# Patient Record
Sex: Female | Born: 1970
Health system: Southern US, Community
[De-identification: ages and names within clinical notes are randomized; demographics above are authoritative.]

## PROBLEM LIST (undated history)

## (undated) DIAGNOSIS — K219 Gastro-esophageal reflux disease without esophagitis: Secondary | ICD-10-CM

## (undated) DIAGNOSIS — F329 Major depressive disorder, single episode, unspecified: Secondary | ICD-10-CM

## (undated) DIAGNOSIS — F419 Anxiety disorder, unspecified: Secondary | ICD-10-CM

## (undated) DIAGNOSIS — I7300 Raynaud's syndrome without gangrene: Secondary | ICD-10-CM

## (undated) DIAGNOSIS — F32A Depression, unspecified: Secondary | ICD-10-CM

## (undated) DIAGNOSIS — R51 Headache: Secondary | ICD-10-CM

## (undated) DIAGNOSIS — M25519 Pain in unspecified shoulder: Secondary | ICD-10-CM

## (undated) DIAGNOSIS — D649 Anemia, unspecified: Secondary | ICD-10-CM

## (undated) DIAGNOSIS — K5900 Constipation, unspecified: Secondary | ICD-10-CM

## (undated) DIAGNOSIS — G4700 Insomnia, unspecified: Secondary | ICD-10-CM

## (undated) DIAGNOSIS — Z780 Asymptomatic menopausal state: Secondary | ICD-10-CM

## (undated) DIAGNOSIS — E669 Obesity, unspecified: Secondary | ICD-10-CM

## (undated) DIAGNOSIS — M549 Dorsalgia, unspecified: Secondary | ICD-10-CM

## (undated) DIAGNOSIS — G4733 Obstructive sleep apnea (adult) (pediatric): Principal | ICD-10-CM

## (undated) DIAGNOSIS — E119 Type 2 diabetes mellitus without complications: Secondary | ICD-10-CM

## (undated) DIAGNOSIS — I10 Essential (primary) hypertension: Secondary | ICD-10-CM

## (undated) DIAGNOSIS — G4730 Sleep apnea, unspecified: Secondary | ICD-10-CM

## (undated) DIAGNOSIS — C801 Malignant (primary) neoplasm, unspecified: Secondary | ICD-10-CM

## (undated) DIAGNOSIS — E785 Hyperlipidemia, unspecified: Secondary | ICD-10-CM

## (undated) DIAGNOSIS — F411 Generalized anxiety disorder: Secondary | ICD-10-CM

## (undated) DIAGNOSIS — Z9289 Personal history of other medical treatment: Secondary | ICD-10-CM

## (undated) DIAGNOSIS — F4320 Adjustment disorder, unspecified: Secondary | ICD-10-CM

## (undated) DIAGNOSIS — R002 Palpitations: Secondary | ICD-10-CM

## (undated) DIAGNOSIS — E559 Vitamin D deficiency, unspecified: Secondary | ICD-10-CM

## (undated) DIAGNOSIS — E538 Deficiency of other specified B group vitamins: Secondary | ICD-10-CM

## (undated) HISTORY — DX: Dorsalgia, unspecified: M54.9

## (undated) HISTORY — DX: Malignant (primary) neoplasm, unspecified: C80.1

## (undated) HISTORY — PX: TYMPANOSTOMY TUBE PLACEMENT: SHX32

## (undated) HISTORY — DX: Obesity, unspecified: E66.9

## (undated) HISTORY — DX: Asymptomatic menopausal state: Z78.0

## (undated) HISTORY — DX: Personal history of other medical treatment: Z92.89

## (undated) HISTORY — DX: Raynaud's syndrome without gangrene: I73.00

## (undated) HISTORY — DX: Major depressive disorder, single episode, unspecified: F32.9

## (undated) HISTORY — DX: Palpitations: R00.2

## (undated) HISTORY — DX: Obstructive sleep apnea (adult) (pediatric): G47.33

## (undated) HISTORY — DX: Type 2 diabetes mellitus without complications: E11.9

## (undated) HISTORY — DX: Pain in unspecified shoulder: M25.519

## (undated) HISTORY — DX: Adjustment disorder, unspecified: F43.20

## (undated) HISTORY — DX: Deficiency of other specified B group vitamins: E53.8

## (undated) HISTORY — PX: TUBAL LIGATION: SHX77

## (undated) HISTORY — DX: Anxiety disorder, unspecified: F41.9

## (undated) HISTORY — DX: Headache: R51

## (undated) HISTORY — DX: Gastro-esophageal reflux disease without esophagitis: K21.9

## (undated) HISTORY — DX: Anemia, unspecified: D64.9

## (undated) HISTORY — DX: Depression, unspecified: F32.A

## (undated) HISTORY — DX: Sleep apnea, unspecified: G47.30

## (undated) HISTORY — DX: Insomnia, unspecified: G47.00

## (undated) HISTORY — PX: BREAST BIOPSY: SHX20

## (undated) HISTORY — PX: ECTOPIC PREGNANCY SURGERY: SHX613

## (undated) HISTORY — DX: Essential (primary) hypertension: I10

## (undated) HISTORY — DX: Generalized anxiety disorder: F41.1

## (undated) HISTORY — PX: RETINAL DETACHMENT SURGERY: SHX105

## (undated) HISTORY — DX: Constipation, unspecified: K59.00

## (undated) HISTORY — DX: Hyperlipidemia, unspecified: E78.5

## (undated) HISTORY — DX: Vitamin D deficiency, unspecified: E55.9

---

## 1997-12-10 ENCOUNTER — Other Ambulatory Visit: Admission: RE | Admit: 1997-12-10 | Discharge: 1997-12-10 | Payer: Self-pay | Admitting: Obstetrics & Gynecology

## 1998-01-25 ENCOUNTER — Other Ambulatory Visit: Admission: RE | Admit: 1998-01-25 | Discharge: 1998-01-25 | Payer: Self-pay | Admitting: Obstetrics & Gynecology

## 1998-03-30 ENCOUNTER — Other Ambulatory Visit: Admission: RE | Admit: 1998-03-30 | Discharge: 1998-03-30 | Payer: Self-pay | Admitting: Obstetrics & Gynecology

## 1998-11-24 ENCOUNTER — Ambulatory Visit (HOSPITAL_COMMUNITY): Admission: RE | Admit: 1998-11-24 | Discharge: 1998-11-24 | Payer: Self-pay | Admitting: Obstetrics & Gynecology

## 1998-11-24 ENCOUNTER — Inpatient Hospital Stay (HOSPITAL_COMMUNITY): Admission: AD | Admit: 1998-11-24 | Discharge: 1998-11-24 | Payer: Self-pay | Admitting: Obstetrics & Gynecology

## 1998-11-24 ENCOUNTER — Encounter: Payer: Self-pay | Admitting: Obstetrics & Gynecology

## 1998-12-01 ENCOUNTER — Inpatient Hospital Stay (HOSPITAL_COMMUNITY): Admission: AD | Admit: 1998-12-01 | Discharge: 1998-12-01 | Payer: Self-pay | Admitting: Obstetrics & Gynecology

## 1998-12-05 ENCOUNTER — Encounter: Payer: Self-pay | Admitting: Obstetrics & Gynecology

## 1998-12-06 ENCOUNTER — Inpatient Hospital Stay (HOSPITAL_COMMUNITY): Admission: AD | Admit: 1998-12-06 | Discharge: 1998-12-08 | Payer: Self-pay | Admitting: Obstetrics & Gynecology

## 2006-05-28 DIAGNOSIS — C801 Malignant (primary) neoplasm, unspecified: Secondary | ICD-10-CM

## 2006-05-28 HISTORY — DX: Malignant (primary) neoplasm, unspecified: C80.1

## 2008-05-28 HISTORY — PX: ABDOMINAL HYSTERECTOMY: SHX81

## 2012-08-05 LAB — HM COLONOSCOPY

## 2013-08-26 LAB — HM MAMMOGRAPHY: HM Mammogram: NORMAL

## 2013-08-26 LAB — HM PAP SMEAR: HM Pap smear: NORMAL

## 2014-11-09 ENCOUNTER — Encounter: Payer: Self-pay | Admitting: *Deleted

## 2014-11-09 ENCOUNTER — Telehealth: Payer: Self-pay | Admitting: *Deleted

## 2014-11-09 NOTE — Telephone Encounter (Signed)
Pre-Visit Call completed with patient and chart updated.   Pre-Visit Info documented in Specialty Comments under [employer].    

## 2014-11-11 ENCOUNTER — Ambulatory Visit (INDEPENDENT_AMBULATORY_CARE_PROVIDER_SITE_OTHER): Payer: Federal, State, Local not specified - PPO | Admitting: Family Medicine

## 2014-11-11 ENCOUNTER — Encounter: Payer: Self-pay | Admitting: Family Medicine

## 2014-11-11 VITALS — BP 130/88 | HR 81 | Temp 98.2°F | Ht 63.0 in | Wt 176.5 lb

## 2014-11-11 DIAGNOSIS — E785 Hyperlipidemia, unspecified: Secondary | ICD-10-CM | POA: Insufficient documentation

## 2014-11-11 DIAGNOSIS — K5900 Constipation, unspecified: Secondary | ICD-10-CM

## 2014-11-11 DIAGNOSIS — E782 Mixed hyperlipidemia: Secondary | ICD-10-CM

## 2014-11-11 DIAGNOSIS — F32A Depression, unspecified: Secondary | ICD-10-CM | POA: Insufficient documentation

## 2014-11-11 DIAGNOSIS — F419 Anxiety disorder, unspecified: Secondary | ICD-10-CM

## 2014-11-11 DIAGNOSIS — C801 Malignant (primary) neoplasm, unspecified: Secondary | ICD-10-CM

## 2014-11-11 DIAGNOSIS — I10 Essential (primary) hypertension: Secondary | ICD-10-CM

## 2014-11-11 DIAGNOSIS — R030 Elevated blood-pressure reading, without diagnosis of hypertension: Secondary | ICD-10-CM

## 2014-11-11 DIAGNOSIS — F329 Major depressive disorder, single episode, unspecified: Secondary | ICD-10-CM | POA: Insufficient documentation

## 2014-11-11 DIAGNOSIS — E669 Obesity, unspecified: Secondary | ICD-10-CM

## 2014-11-11 DIAGNOSIS — IMO0001 Reserved for inherently not codable concepts without codable children: Secondary | ICD-10-CM

## 2014-11-11 DIAGNOSIS — K219 Gastro-esophageal reflux disease without esophagitis: Secondary | ICD-10-CM | POA: Diagnosis not present

## 2014-11-11 LAB — CBC
HCT: 40.7 % (ref 36.0–46.0)
Hemoglobin: 13.6 g/dL (ref 12.0–15.0)
MCHC: 33.5 g/dL (ref 30.0–36.0)
MCV: 89.7 fl (ref 78.0–100.0)
Platelets: 247 10*3/uL (ref 150.0–400.0)
RBC: 4.53 Mil/uL (ref 3.87–5.11)
RDW: 14 % (ref 11.5–15.5)
WBC: 5.2 10*3/uL (ref 4.0–10.5)

## 2014-11-11 LAB — COMPREHENSIVE METABOLIC PANEL
ALT: 10 U/L (ref 0–35)
AST: 13 U/L (ref 0–37)
Albumin: 4.1 g/dL (ref 3.5–5.2)
Alkaline Phosphatase: 36 U/L — ABNORMAL LOW (ref 39–117)
BUN: 17 mg/dL (ref 6–23)
CO2: 27 mEq/L (ref 19–32)
Calcium: 9.2 mg/dL (ref 8.4–10.5)
Chloride: 104 mEq/L (ref 96–112)
Creatinine, Ser: 0.73 mg/dL (ref 0.40–1.20)
GFR: 92.22 mL/min (ref >60.00)
Glucose, Bld: 75 mg/dL (ref 70–99)
Potassium: 3.9 mEq/L (ref 3.5–5.1)
Sodium: 137 mEq/L (ref 135–145)
Total Bilirubin: 0.4 mg/dL (ref 0.2–1.2)
Total Protein: 6.9 g/dL (ref 6.0–8.3)

## 2014-11-11 LAB — LIPID PANEL
Cholesterol: 213 mg/dL — ABNORMAL HIGH (ref 0–200)
HDL: 89 mg/dL (ref >39.00)
LDL Cholesterol: 111 mg/dL — ABNORMAL HIGH (ref 0–99)
NonHDL: 124
Total CHOL/HDL Ratio: 2
Triglycerides: 67 mg/dL (ref 0.0–149.0)
VLDL: 13.4 mg/dL (ref 0.0–40.0)

## 2014-11-11 LAB — TSH: TSH: 2.67 u[IU]/mL (ref 0.35–4.50)

## 2014-11-11 NOTE — Assessment & Plan Note (Signed)
Referred to dermatology for surveillance.

## 2014-11-11 NOTE — Patient Instructions (Addendum)
Probiotic daily  And/or yogurt, consider [employer] or [facility] or online at [email or web address], [place] makes a 10 strain probiotic  Preventive Care for Adults A healthy lifestyle and preventive care can promote health and wellness. Preventive health guidelines for women include the following key practices.  A routine yearly physical is a good way to check with your health care provider about your health and preventive screening. It is a chance to share any concerns and updates on your health and to receive a thorough exam.  Visit your dentist for a routine exam and preventive care every 6 months. Brush your teeth twice a day and floss once a day. Good oral hygiene prevents tooth decay and gum disease.  The frequency of eye exams is based on your age, health, family medical history, use of contact lenses, and other factors. Follow your health care provider's recommendations for frequency of eye exams.  Eat a healthy diet. Foods like vegetables, fruits, whole grains, low-fat dairy products, and lean protein foods contain the nutrients you need without too many calories. Decrease your intake of foods high in solid fats, added sugars, and salt. Eat the right amount of calories for you.Get information about a proper diet from your health care provider, if necessary.  Regular physical exercise is one of the most important things you can do for your health. Most adults should get at least 150 minutes of moderate-intensity exercise (any activity that increases your heart rate and causes you to sweat) each week. In addition, most adults need muscle-strengthening exercises on 2 or more days a week.  Maintain a healthy weight. The body mass index (BMI) is a screening tool to identify possible weight problems. It provides an estimate of body fat based on height and weight. Your health care provider can find your BMI and can help you achieve or maintain a healthy weight.For adults 20  years and older:  A BMI below 18.5 is considered underweight.  A BMI of 18.5 to 24.9 is normal.  A BMI of 25 to 29.9 is considered overweight.  A BMI of 30 and above is considered obese.  Maintain normal blood lipids and cholesterol levels by exercising and minimizing your intake of saturated fat. Eat a balanced diet with plenty of fruit and vegetables. Blood tests for lipids and cholesterol should begin at age 66 and be repeated every 5 years. If your lipid or cholesterol levels are high, you are over 50, or you are at high risk for heart disease, you may need your cholesterol levels checked more frequently.Ongoing high lipid and cholesterol levels should be treated with medicines if diet and exercise are not working.  If you smoke, find out from your health care provider how to quit. If you do not use tobacco, do not start.  Lung cancer screening is recommended for adults aged 29-80 years who are at high risk for developing lung cancer because of a history of smoking. A yearly low-dose CT scan of the lungs is recommended for people who have at least a 30-pack-year history of smoking and are a current smoker or have quit within the past 15 years. A pack year of smoking is smoking an average of 1 pack of cigarettes a day for 1 year (for example: 1 pack a day for 30 years or 2 packs a day for 15 years). Yearly screening should continue until the smoker has stopped smoking for at least 15 years. Yearly screening should be stopped for people who develop a  health problem that would prevent them from having lung cancer treatment.  If you are pregnant, do not drink alcohol. If you are breastfeeding, be very cautious about drinking alcohol. If you are not pregnant and choose to drink alcohol, do not have more than 1 drink per day. One drink is considered to be 12 ounces (355 mL) of beer, 5 ounces (148 mL) of wine, or 1.5 ounces (44 mL) of liquor.  Avoid use of street drugs. Do not share needles with  anyone. Ask for help if you need support or instructions about stopping the use of drugs.  High blood pressure causes heart disease and increases the risk of stroke. Your blood pressure should be checked at least every 1 to 2 years. Ongoing high blood pressure should be treated with medicines if weight loss and exercise do not work.  If you are 95-45 years old, ask your health care provider if you should take aspirin to prevent strokes.  Diabetes screening involves taking a blood sample to check your fasting blood sugar level. This should be done once every 3 years, after age 92, if you are within normal weight and without risk factors for diabetes. Testing should be considered at a younger age or be carried out more frequently if you are overweight and have at least 1 risk factor for diabetes.  Breast cancer screening is essential preventive care for women. You should practice "breast self-awareness." This means understanding the normal appearance and feel of your breasts and may include breast self-examination. Any changes detected, no matter how small, should be reported to a health care provider. Women in their 42s and 30s should have a clinical breast exam (CBE) by a health care provider as part of a regular health exam every 1 to 3 years. After age 41, women should have a CBE every year. Starting at age 3, women should consider having a mammogram (breast X-ray test) every year. Women who have a family history of breast cancer should talk to their health care provider about genetic screening. Women at a high risk of breast cancer should talk to their health care providers about having an MRI and a mammogram every year.  Breast cancer gene (BRCA)-related cancer risk assessment is recommended for women who have family members with BRCA-related cancers. BRCA-related cancers include breast, ovarian, tubal, and peritoneal cancers. Having family members with these cancers may be associated with an  increased risk for harmful changes (mutations) in the breast cancer genes BRCA1 and BRCA2. Results of the assessment will determine the need for genetic counseling and BRCA1 and BRCA2 testing.  Routine pelvic exams to screen for cancer are no longer recommended for nonpregnant women who are considered low risk for cancer of the pelvic organs (ovaries, uterus, and vagina) and who do not have symptoms. Ask your health care provider if a screening pelvic exam is right for you.  If you have had past treatment for cervical cancer or a condition that could lead to cancer, you need Pap tests and screening for cancer for at least 20 years after your treatment. If Pap tests have been discontinued, your risk factors (such as having a new sexual partner) need to be reassessed to determine if screening should be resumed. Some women have medical problems that increase the chance of getting cervical cancer. In these cases, your health care provider may recommend more frequent screening and Pap tests.  The HPV test is an additional test that may be used for cervical cancer screening. The  HPV test looks for the virus that can cause the cell changes on the cervix. The cells collected during the Pap test can be tested for HPV. The HPV test could be used to screen women aged 40 years and older, and should be used in women of any age who have unclear Pap test results. After the age of 65, women should have HPV testing at the same frequency as a Pap test.  Colorectal cancer can be detected and often prevented. Most routine colorectal cancer screening begins at the age of 68 years and continues through age 45 years. However, your health care provider may recommend screening at an earlier age if you have risk factors for colon cancer. On a yearly basis, your health care provider may provide home test kits to check for hidden blood in the stool. Use of a small camera at the end of a tube, to directly examine the colon  (sigmoidoscopy or colonoscopy), can detect the earliest forms of colorectal cancer. Talk to your health care provider about this at age 31, when routine screening begins. Direct exam of the colon should be repeated every 5-10 years through age 74 years, unless early forms of pre-cancerous polyps or small growths are found.  People who are at an increased risk for hepatitis B should be screened for this virus. You are considered at high risk for hepatitis B if:  You were born in a country where hepatitis B occurs often. Talk with your health care provider about which countries are considered high risk.  Your parents were born in a high-risk country and you have not received a shot to protect against hepatitis B (hepatitis B vaccine).  You have HIV or AIDS.  You use needles to inject street drugs.  You live with, or have sex with, someone who has hepatitis B.  You get hemodialysis treatment.  You take certain medicines for conditions like cancer, organ transplantation, and autoimmune conditions.  Hepatitis C blood testing is recommended for all people born from [birth year] through 1965 and any individual with known risks for hepatitis C.  Practice safe sex. Use condoms and avoid high-risk sexual practices to reduce the spread of sexually transmitted infections (STIs). STIs include gonorrhea, chlamydia, syphilis, trichomonas, herpes, HPV, and human immunodeficiency virus (HIV). Herpes, HIV, and HPV are viral illnesses that have no cure. They can result in disability, cancer, and death.  You should be screened for sexually transmitted illnesses (STIs) including gonorrhea and chlamydia if:  You are sexually active and are younger than 24 years.  You are older than 24 years and your health care provider tells you that you are at risk for this type of infection.  Your sexual activity has changed since you were last screened and you are at an increased risk for chlamydia or gonorrhea. Ask your health  care provider if you are at risk.  If you are at risk of being infected with HIV, it is recommended that you take a prescription medicine daily to prevent HIV infection. This is called preexposure prophylaxis (PrEP). You are considered at risk if:  You are a heterosexual woman, are sexually active, and are at increased risk for HIV infection.  You take drugs by injection.  You are sexually active with a partner who has HIV.  Talk with your health care provider about whether you are at high risk of being infected with HIV. If you choose to begin PrEP, you should first be tested for HIV. You should then be tested  every 3 months for as long as you are taking PrEP.  Osteoporosis is a disease in which the bones lose minerals and strength with aging. This can result in serious bone fractures or breaks. The risk of osteoporosis can be identified using a bone density scan. Women ages 20 years and over and women at risk for fractures or osteoporosis should discuss screening with their health care providers. Ask your health care provider whether you should take a calcium supplement or vitamin D to reduce the rate of osteoporosis.  Menopause can be associated with physical symptoms and risks. Hormone replacement therapy is available to decrease symptoms and risks. You should talk to your health care provider about whether hormone replacement therapy is right for you.  Use sunscreen. Apply sunscreen liberally and repeatedly throughout the day. You should seek shade when your shadow is shorter than you. Protect yourself by wearing long sleeves, pants, a wide-brimmed hat, and sunglasses year round, whenever you are outdoors.  Once a month, do a whole body skin exam, using a mirror to look at the skin on your back. Tell your health care provider of new moles, moles that have irregular borders, moles that are larger than a pencil eraser, or moles that have changed in shape or color.  Stay current with required  vaccines (immunizations).  Influenza vaccine. All adults should be immunized every year.  Tetanus, diphtheria, and acellular pertussis (Td, Tdap) vaccine. Pregnant women should receive 1 dose of Tdap vaccine during each pregnancy. The dose should be obtained regardless of the length of time since the last dose. Immunization is preferred during the 27th-36th week of gestation. An adult who has not previously received Tdap or who does not know her vaccine status should receive 1 dose of Tdap. This initial dose should be followed by tetanus and diphtheria toxoids (Td) booster doses every 10 years. Adults with an unknown or incomplete history of completing a 3-dose immunization series with Td-containing vaccines should begin or complete a primary immunization series including a Tdap dose. Adults should receive a Td booster every 10 years.  Varicella vaccine. An adult without evidence of immunity to varicella should receive 2 doses or a second dose if she has previously received 1 dose. Pregnant females who do not have evidence of immunity should receive the first dose after pregnancy. This first dose should be obtained before leaving the health care facility. The second dose should be obtained 4-8 weeks after the first dose.  Human papillomavirus (HPV) vaccine. Females aged 13-26 years who have not received the vaccine previously should obtain the 3-dose series. The vaccine is not recommended for use in pregnant females. However, pregnancy testing is not needed before receiving a dose. If a female is found to be pregnant after receiving a dose, no treatment is needed. In that case, the remaining doses should be delayed until after the pregnancy. Immunization is recommended for any person with an immunocompromised condition through the age of 65 years if she did not get any or all doses earlier. During the 3-dose series, the second dose should be obtained 4-8 weeks after the first dose. The third dose should be  obtained 24 weeks after the first dose and 16 weeks after the second dose.  Zoster vaccine. One dose is recommended for adults aged 76 years or older unless certain conditions are present.  Measles, mumps, and rubella (MMR) vaccine. Adults born before [birth year] generally are considered immune to measles and mumps. Adults born in [birth year] or later should  have 1 or more doses of MMR vaccine unless there is a contraindication to the vaccine or there is laboratory evidence of immunity to each of the three diseases. A routine second dose of MMR vaccine should be obtained at least 28 days after the first dose for students attending postsecondary schools, health care workers, or international travelers. People who received inactivated measles vaccine or an unknown type of measles vaccine during 1963-1967 should receive 2 doses of MMR vaccine. People who received inactivated mumps vaccine or an unknown type of mumps vaccine before 1979 and are at high risk for mumps infection should consider immunization with 2 doses of MMR vaccine. For females of childbearing age, rubella immunity should be determined. If there is no evidence of immunity, females who are not pregnant should be vaccinated. If there is no evidence of immunity, females who are pregnant should delay immunization until after pregnancy. Unvaccinated health care workers born before [birth year] who lack laboratory evidence of measles, mumps, or rubella immunity or laboratory confirmation of disease should consider measles and mumps immunization with 2 doses of MMR vaccine or rubella immunization with 1 dose of MMR vaccine.  Pneumococcal 13-valent conjugate (PCV13) vaccine. When indicated, a person who is uncertain of her immunization history and has no record of immunization should receive the PCV13 vaccine. An adult aged 60 years or older who has certain medical conditions and has not been previously immunized should receive 1 dose of PCV13 vaccine. This PCV13 should be  followed with a dose of pneumococcal polysaccharide (PPSV23) vaccine. The PPSV23 vaccine dose should be obtained at least 8 weeks after the dose of PCV13 vaccine. An adult aged 42 years or older who has certain medical conditions and previously received 1 or more doses of PPSV23 vaccine should receive 1 dose of PCV13. The PCV13 vaccine dose should be obtained 1 or more years after the last PPSV23 vaccine dose.  Pneumococcal polysaccharide (PPSV23) vaccine. When PCV13 is also indicated, PCV13 should be obtained first. All adults aged 81 years and older should be immunized. An adult younger than age 33 years who has certain medical conditions should be immunized. Any person who resides in a nursing home or long-term care facility should be immunized. An adult smoker should be immunized. People with an immunocompromised condition and certain other conditions should receive both PCV13 and PPSV23 vaccines. People with human immunodeficiency virus (HIV) infection should be immunized as soon as possible after diagnosis. Immunization during chemotherapy or radiation therapy should be avoided. Routine use of PPSV23 vaccine is not recommended for American Indians, [place] Natives, or people younger than 65 years unless there are medical conditions that require PPSV23 vaccine. When indicated, people who have unknown immunization and have no record of immunization should receive PPSV23 vaccine. One-time revaccination 5 years after the first dose of PPSV23 is recommended for people aged 19-64 years who have chronic kidney failure, nephrotic syndrome, asplenia, or immunocompromised conditions. People who received 1-2 doses of PPSV23 before age 7 years should receive another dose of PPSV23 vaccine at age 42 years or later if at least 5 years have passed since the previous dose. Doses of PPSV23 are not needed for people immunized with PPSV23 at or after age 58 years.  Meningococcal vaccine. Adults with asplenia or persistent  complement component deficiencies should receive 2 doses of quadrivalent meningococcal conjugate (MenACWY-D) vaccine. The doses should be obtained at least 2 months apart. Microbiologists working with certain meningococcal bacteria, [employer] recruits, people at risk during an outbreak, and  people who travel to or live in countries with a high rate of meningitis should be immunized. A first-year college student up through age 39 years who is living in a residence hall should receive a dose if she did not receive a dose on or after her 16th birthday. Adults who have certain high-risk conditions should receive one or more doses of vaccine.  Hepatitis A vaccine. Adults who wish to be protected from this disease, have certain high-risk conditions, work with hepatitis A-infected animals, work in hepatitis A research labs, or travel to or work in countries with a high rate of hepatitis A should be immunized. Adults who were previously unvaccinated and who anticipate close contact with an international adoptee during the first 60 days after arrival in the [place] from a country with a high rate of hepatitis A should be immunized.  Hepatitis B vaccine. Adults who wish to be protected from this disease, have certain high-risk conditions, may be exposed to blood or other infectious body fluids, are household contacts or sex partners of hepatitis B positive people, are clients or workers in certain care facilities, or travel to or work in countries with a high rate of hepatitis B should be immunized.  Haemophilus influenzae type b (Hib) vaccine. A previously unvaccinated person with asplenia or sickle cell disease or having a scheduled splenectomy should receive 1 dose of Hib vaccine. Regardless of previous immunization, a recipient of a hematopoietic stem cell transplant should receive a 3-dose series 6-12 months after her successful transplant. Hib vaccine is not recommended for adults with HIV  infection. Preventive Services / Frequency Ages 76 to 63 years  Blood pressure check.** / Every 1 to 2 years.  Lipid and cholesterol check.** / Every 5 years beginning at age 80.  Clinical breast exam.** / Every 3 years for women in their 21s and 47s.  BRCA-related cancer risk assessment.** / For women who have family members with a BRCA-related cancer (breast, ovarian, tubal, or peritoneal cancers).  Pap test.** / Every 2 years from ages 47 through 44. Every 3 years starting at age 85 through age 37 or 48 with a history of 3 consecutive normal Pap tests.  HPV screening.** / Every 3 years from ages 62 through ages 36 to 61 with a history of 3 consecutive normal Pap tests.  Hepatitis C blood test.** / For any individual with known risks for hepatitis C.  Skin self-exam. / Monthly.  Influenza vaccine. / Every year.  Tetanus, diphtheria, and acellular pertussis (Tdap, Td) vaccine.** / Consult your health care provider. Pregnant women should receive 1 dose of Tdap vaccine during each pregnancy. 1 dose of Td every 10 years.  Varicella vaccine.** / Consult your health care provider. Pregnant females who do not have evidence of immunity should receive the first dose after pregnancy.  HPV vaccine. / 3 doses over 6 months, if 50 and younger. The vaccine is not recommended for use in pregnant females. However, pregnancy testing is not needed before receiving a dose.  Measles, mumps, rubella (MMR) vaccine.** / You need at least 1 dose of MMR if you were born in 1957 or later. You may also need a 2nd dose. For females of childbearing age, rubella immunity should be determined. If there is no evidence of immunity, females who are not pregnant should be vaccinated. If there is no evidence of immunity, females who are pregnant should delay immunization until after pregnancy.  Pneumococcal 13-valent conjugate (PCV13) vaccine.** / Consult your health care  provider.  Pneumococcal polysaccharide  (PPSV23) vaccine.** / 1 to 2 doses if you smoke cigarettes or if you have certain conditions.  Meningococcal vaccine.** / 1 dose if you are age 87 to 96 years and a Market researcher living in a residence hall, or have one of several medical conditions, you need to get vaccinated against meningococcal disease. You may also need additional booster doses.  Hepatitis A vaccine.** / Consult your health care provider.  Hepatitis B vaccine.** / Consult your health care provider.  Haemophilus influenzae type b (Hib) vaccine.** / Consult your health care provider. Ages 54 to 71 years  Blood pressure check.** / Every 1 to 2 years.  Lipid and cholesterol check.** / Every 5 years beginning at age 29 years.  Lung cancer screening. / Every year if you are aged 62-80 years and have a 30-pack-year history of smoking and currently smoke or have quit within the past 15 years. Yearly screening is stopped once you have quit smoking for at least 15 years or develop a health problem that would prevent you from having lung cancer treatment.  Clinical breast exam.** / Every year after age 16 years.  BRCA-related cancer risk assessment.** / For women who have family members with a BRCA-related cancer (breast, ovarian, tubal, or peritoneal cancers).  Mammogram.** / Every year beginning at age 77 years and continuing for as long as you are in good health. Consult with your health care provider.  Pap test.** / Every 3 years starting at age 69 years through age 29 or 15 years with a history of 3 consecutive normal Pap tests.  HPV screening.** / Every 3 years from ages 2 years through ages 45 to 69 years with a history of 3 consecutive normal Pap tests.  Fecal occult blood test (FOBT) of stool. / Every year beginning at age 72 years and continuing until age 4 years. You may not need to do this test if you get a colonoscopy every 10 years.  Flexible sigmoidoscopy or colonoscopy.** / Every 5 years for a  flexible sigmoidoscopy or every 10 years for a colonoscopy beginning at age 72 years and continuing until age 19 years.  Hepatitis C blood test.** / For all people born from [birth year] through 1965 and any individual with known risks for hepatitis C.  Skin self-exam. / Monthly.  Influenza vaccine. / Every year.  Tetanus, diphtheria, and acellular pertussis (Tdap/Td) vaccine.** / Consult your health care provider. Pregnant women should receive 1 dose of Tdap vaccine during each pregnancy. 1 dose of Td every 10 years.  Varicella vaccine.** / Consult your health care provider. Pregnant females who do not have evidence of immunity should receive the first dose after pregnancy.  Zoster vaccine.** / 1 dose for adults aged 42 years or older.  Measles, mumps, rubella (MMR) vaccine.** / You need at least 1 dose of MMR if you were born in 1957 or later. You may also need a 2nd dose. For females of childbearing age, rubella immunity should be determined. If there is no evidence of immunity, females who are not pregnant should be vaccinated. If there is no evidence of immunity, females who are pregnant should delay immunization until after pregnancy.  Pneumococcal 13-valent conjugate (PCV13) vaccine.** / Consult your health care provider.  Pneumococcal polysaccharide (PPSV23) vaccine.** / 1 to 2 doses if you smoke cigarettes or if you have certain conditions.  Meningococcal vaccine.** / Consult your health care provider.  Hepatitis A vaccine.** / Consult your health care provider.  Hepatitis B vaccine.** / Consult your health care provider.  Haemophilus influenzae type b (Hib) vaccine.** / Consult your health care provider. Ages 16 years and over  Blood pressure check.** / Every 1 to 2 years.  Lipid and cholesterol check.** / Every 5 years beginning at age 66 years.  Lung cancer screening. / Every year if you are aged 60-80 years and have a 30-pack-year history of smoking and currently smoke or have  quit within the past 15 years. Yearly screening is stopped once you have quit smoking for at least 15 years or develop a health problem that would prevent you from having lung cancer treatment.  Clinical breast exam.** / Every year after age 29 years.  BRCA-related cancer risk assessment.** / For women who have family members with a BRCA-related cancer (breast, ovarian, tubal, or peritoneal cancers).  Mammogram.** / Every year beginning at age 50 years and continuing for as long as you are in good health. Consult with your health care provider.  Pap test.** / Every 3 years starting at age 57 years through age 42 or 70 years with 3 consecutive normal Pap tests. Testing can be stopped between 65 and 70 years with 3 consecutive normal Pap tests and no abnormal Pap or HPV tests in the past 10 years.  HPV screening.** / Every 3 years from ages 71 years through ages 84 or 65 years with a history of 3 consecutive normal Pap tests. Testing can be stopped between 65 and 70 years with 3 consecutive normal Pap tests and no abnormal Pap or HPV tests in the past 10 years.  Fecal occult blood test (FOBT) of stool. / Every year beginning at age 47 years and continuing until age 63 years. You may not need to do this test if you get a colonoscopy every 10 years.  Flexible sigmoidoscopy or colonoscopy.** / Every 5 years for a flexible sigmoidoscopy or every 10 years for a colonoscopy beginning at age 26 years and continuing until age 56 years.  Hepatitis C blood test.** / For all people born from [birth year] through 1965 and any individual with known risks for hepatitis C.  Osteoporosis screening.** / A one-time screening for women ages 67 years and over and women at risk for fractures or osteoporosis.  Skin self-exam. / Monthly.  Influenza vaccine. / Every year.  Tetanus, diphtheria, and acellular pertussis (Tdap/Td) vaccine.** / 1 dose of Td every 10 years.  Varicella vaccine.** / Consult your health care  provider.  Zoster vaccine.** / 1 dose for adults aged 57 years or older.  Pneumococcal 13-valent conjugate (PCV13) vaccine.** / Consult your health care provider.  Pneumococcal polysaccharide (PPSV23) vaccine.** / 1 dose for all adults aged 74 years and older.  Meningococcal vaccine.** / Consult your health care provider.  Hepatitis A vaccine.** / Consult your health care provider.  Hepatitis B vaccine.** / Consult your health care provider.  Haemophilus influenzae type b (Hib) vaccine.** / Consult your health care provider. ** Family history and personal history of risk and conditions may change your health care provider's recommendations. Document Released: 07/10/2001 Document Revised: 09/28/2013 Document Reviewed: 10/09/2010 [facility] Patient Information 2015 [place], [place]. This information is not intended to replace advice given to you by your health care provider. Make sure you discuss any questions you have with your health care provider.

## 2014-11-11 NOTE — Progress Notes (Signed)
Pre visit review using our clinic review tool, if applicable. No additional management support is needed unless otherwise documented below in the visit note. 

## 2014-11-14 ENCOUNTER — Encounter: Payer: Self-pay | Admitting: Family Medicine

## 2014-11-14 DIAGNOSIS — E669 Obesity, unspecified: Secondary | ICD-10-CM

## 2014-11-14 DIAGNOSIS — K5900 Constipation, unspecified: Secondary | ICD-10-CM

## 2014-11-14 HISTORY — DX: Obesity, unspecified: E66.9

## 2014-11-14 HISTORY — DX: Constipation, unspecified: K59.00

## 2014-11-14 NOTE — Assessment & Plan Note (Signed)
Avoid offending foods, start probiotics. Do not eat large meals in late evening and consider raising head of bed.  

## 2014-11-14 NOTE — Assessment & Plan Note (Signed)
Well controlled, no changes to meds. Encouraged heart healthy diet such as the DASH diet and exercise as tolerated.  °

## 2014-11-14 NOTE — Assessment & Plan Note (Signed)
Encouraged heart healthy diet, increase exercise, avoid trans fats, consider a krill oil cap daily 

## 2014-11-14 NOTE — Progress Notes (Signed)
Julie Bishop  [id number] 1971/03/22 11/14/2014      Progress Note-Follow Up  Subjective  Chief Complaint  Chief Complaint  Patient presents with  . Establish Care    HPI  Patient is a 44 y.o. female in today for routine medical care. She is here today to establish care. She has recently moved [place] she notes she has lost a good deal of weight the last few years is down from 220. She reports she was having trouble with hypertension before the weight loss. Had a hysterectomy in 2015 for painful uterine fibroids and scarring. She believes her right ovaries still in place. She has been maintaining a low carbohydrate high-protein diet. Her past medical history is otherwise significant for hyperlipidemia, anxiety, depression and reflux. With weight loss these symptoms have improved for the most part. Denies CP/palp/SOB/HA/congestion/fevers/GI or GU c/o. Taking meds as prescribed  Past Medical History  Diagnosis Date  . Depression   . Anxiety   . GERD (gastroesophageal reflux disease)   . Hyperlipidemia     Elevated, but never taken medication  . Hypertension   . Cancer 2008    skin cancer, BCC  . Constipation 11/14/2014  . Obesity 11/14/2014    Past Surgical History  Procedure Laterality Date  . Abdominal hysterectomy  2010  . Ectopic pregnancy surgery    . Breast biopsy      right, 1996  . Tubal ligation      on right    Family History  Problem Relation Age of Onset  . Heart disease Mother   . Diabetes Mother   . Depression Mother   . Diabetes Father   . Cancer Maternal Grandmother     throat  . Heart disease Maternal Grandmother   . Cancer Paternal Grandmother     lung  . Heart disease Paternal Grandmother   . Hypertension Sister     as a teenager    History   Social History  . Marital Status: Married    Spouse Name: N/A  . Number of Children: N/A  . Years of Education: 12   Occupational History  . claims representative    Social History  Main Topics  . Smoking status: Never Smoker   . Smokeless tobacco: Not on file  . Alcohol Use: No  . Drug Use: No  . Sexual Activity: Yes     Comment: works at [employer] off in [place], minimize carbs., lives with husband   Other Topics Concern  . Not on file   Social History Narrative    No current outpatient prescriptions on file prior to visit.   No current facility-administered medications on file prior to visit.    Allergies  Allergen Reactions  . Oxycodone Itching    Review of Systems  Review of Systems  Constitutional: Negative for fever, chills and malaise/fatigue.  HENT: Negative for congestion, hearing loss and nosebleeds.   Eyes: Negative for discharge.  Respiratory: Negative for cough, sputum production, shortness of breath and wheezing.   Cardiovascular: Negative for chest pain, palpitations and leg swelling.  Gastrointestinal: Negative for heartburn, nausea, vomiting, abdominal pain, diarrhea, constipation and blood in stool.  Genitourinary: Negative for dysuria, urgency, frequency and hematuria.  Musculoskeletal: Negative for myalgias, back pain and falls.  Skin: Negative for rash.  Neurological: Negative for dizziness, tremors, sensory change, focal weakness, loss of consciousness, weakness and headaches.  Endo/Heme/Allergies: Negative for polydipsia. Does not bruise/bleed easily.  Psychiatric/Behavioral: Negative for depression and suicidal ideas. The  patient is not nervous/anxious and does not have insomnia.     Objective  BP 130/88 mmHg  Pulse 81  Temp(Src) 98.2 F (36.8 C) (Oral)  Ht 5\' 3"  (1.6 m)  Wt 176 lb 8 oz (80.06 kg)  BMI 31.27 kg/m2  SpO2 97%  Physical Exam  Physical Exam  Constitutional: She is oriented to person, place, and time and well-developed, well-nourished, and in no distress. No distress.  HENT:  Head: Normocephalic and atraumatic.  Right Ear: External ear normal.  Left Ear: External ear normal.  Nose: Nose normal.    Mouth/Throat: Oropharynx is clear and moist. No oropharyngeal exudate.  Eyes: Conjunctivae are normal. Pupils are equal, round, and reactive to light. Right eye exhibits no discharge. Left eye exhibits no discharge. No scleral icterus.  Neck: Normal range of motion. Neck supple. No thyromegaly present.  Cardiovascular: Normal rate, regular rhythm, normal heart sounds and intact distal pulses.   No murmur heard. Pulmonary/Chest: Effort normal and breath sounds normal. No respiratory distress. She has no wheezes. She has no rales.  Abdominal: Soft. Bowel sounds are normal. She exhibits no distension and no mass. There is no tenderness.  Musculoskeletal: Normal range of motion. She exhibits no edema or tenderness.  Lymphadenopathy:    She has no cervical adenopathy.  Neurological: She is alert and oriented to person, place, and time. She has normal reflexes. No cranial nerve deficit. Coordination normal.  Skin: Skin is warm and dry. No rash noted. She is not diaphoretic.  Psychiatric: Mood, memory and affect normal.    Lab Results  Component Value Date   TSH 2.67 11/11/2014   Lab Results  Component Value Date   WBC 5.2 11/11/2014   HGB 13.6 11/11/2014   HCT 40.7 11/11/2014   MCV 89.7 11/11/2014   PLT 247.0 11/11/2014   Lab Results  Component Value Date   CREATININE 0.73 11/11/2014   BUN 17 11/11/2014   NA 137 11/11/2014   K 3.9 11/11/2014   CL 104 11/11/2014   CO2 27 11/11/2014   Lab Results  Component Value Date   ALT 10 11/11/2014   AST 13 11/11/2014   ALKPHOS 36* 11/11/2014   BILITOT 0.4 11/11/2014   Lab Results  Component Value Date   CHOL 213* 11/11/2014   Lab Results  Component Value Date   HDL 89.00 11/11/2014   Lab Results  Component Value Date   LDLCALC 111* 11/11/2014   Lab Results  Component Value Date   TRIG 67.0 11/11/2014   Lab Results  Component Value Date   CHOLHDL 2 11/11/2014     Assessment & Plan  Cancer Referred to dermatology for  surveillance  GERD (gastroesophageal reflux disease) Avoid offending foods, start probiotics. Do not eat large meals in late evening and consider raising head of bed.   Hypertension Well controlled, no changes to meds. Encouraged heart healthy diet such as the DASH diet and exercise as tolerated.   Hyperlipidemia Encouraged heart healthy diet, increase exercise, avoid trans fats, consider a krill oil cap daily  Constipation Encouraged increased hydration and fiber in diet. Daily probiotics. If bowels not moving can use MOM 2 tbls po in 4 oz of warm prune juice by mouth every 2-3 days. If no results then repeat in 4 hours with  Dulcolax suppository pr, may repeat again in 4 more hours as needed. Seek care if symptoms worsen. May continue to use Miralax as needed  Obesity Encouraged DASH diet, decrease po intake and increase exercise  as tolerated. Needs 7-8 hours of sleep nightly. Avoid trans fats, eat small, frequent meals every 4-5 hours with lean proteins, complex carbs and healthy fats. Minimize simple carbs

## 2014-11-14 NOTE — Assessment & Plan Note (Signed)
Encouraged DASH diet, decrease po intake and increase exercise as tolerated. Needs 7-8 hours of sleep nightly. Avoid trans fats, eat small, frequent meals every 4-5 hours with lean proteins, complex carbs and healthy fats. Minimize simple carbs 

## 2014-11-14 NOTE — Assessment & Plan Note (Signed)
Encouraged increased hydration and fiber in diet. Daily probiotics. If bowels not moving can use MOM 2 tbls po in 4 oz of warm prune juice by mouth every 2-3 days. If no results then repeat in 4 hours with  Dulcolax suppository pr, may repeat again in 4 more hours as needed. Seek care if symptoms worsen. May continue to use Miralax as needed

## 2014-12-08 ENCOUNTER — Telehealth: Payer: Self-pay | Admitting: Family Medicine

## 2014-12-08 ENCOUNTER — Ambulatory Visit (INDEPENDENT_AMBULATORY_CARE_PROVIDER_SITE_OTHER): Payer: Federal, State, Local not specified - PPO | Admitting: Medical

## 2014-12-08 ENCOUNTER — Encounter: Payer: Self-pay | Admitting: Medical

## 2014-12-08 VITALS — BP 150/98 | HR 70 | Temp 98.2°F | Ht 63.0 in | Wt 179.6 lb

## 2014-12-08 DIAGNOSIS — I10 Essential (primary) hypertension: Secondary | ICD-10-CM | POA: Diagnosis not present

## 2014-12-08 DIAGNOSIS — R631 Polydipsia: Secondary | ICD-10-CM | POA: Diagnosis not present

## 2014-12-08 LAB — GLUCOSE, POCT (MANUAL RESULT ENTRY): POC Glucose: 85 mg/dl (ref 70–99)

## 2014-12-08 MED ORDER — ENALAPRIL-HYDROCHLOROTHIAZIDE 5-12.5 MG PO TABS: 1.0000 | ORAL_TABLET | Freq: Every day | ORAL | Status: DC

## 2014-12-08 NOTE — Patient Instructions (Addendum)
Hypertension Recent increase with moderate symptom but neg neurologic exam. Restart enalapril/hctz. Banana every other day to keep k up.  If you loose weight and bp come under control again then may stop.   Polydipsia Will check bs today.    Follow up in 2 weeks or as needed.  If HA worsens or neurologic signs and symptoms as discussed then ED evaluation.

## 2014-12-08 NOTE — Telephone Encounter (Signed)
Error

## 2014-12-08 NOTE — Assessment & Plan Note (Signed)
Will check bs today.

## 2014-12-08 NOTE — Progress Notes (Signed)
Pre visit review using our clinic review tool, if applicable. No additional management support is needed unless otherwise documented below in the visit note. 

## 2014-12-08 NOTE — Progress Notes (Signed)
Subjective:    Patient ID: EDIT Julie Bishop, female    DOB: 21-May-1971, 44 y.o.   MRN: [id number]  HPI  Pt in for ha and elevated blood pressure. No decongestants.Pt had hysterectomy in the past.  Pt states lost 75 lb purposeful with weiight loss oct 2014 to oct 2015.  Since November 2016 she started to gain weight again. She thinks this cause bp to increase.  Hx of htn in past before she lost weight and she states when her bp was elevated in the past would cause ha. With weight loss in past bp became too low while on med and med was eventually stopped.  Pt in the past was on enalparil/hctz in the past.  HA recently last 2 wks every day past two weeks. Some tmj area tenderness today. Pt lost her bp cuff moving. She knows should have one and indicates if can't find will get one.  Pt mouth feels dry and mild fatigue. She stats thirsty. Not sweating.   Review of Systems  Constitutional: Positive for fatigue. Negative for fever, chills, diaphoresis and activity change.  Respiratory: Negative for cough, chest tightness and shortness of breath.   Cardiovascular: Negative for chest pain, palpitations and leg swelling.  Gastrointestinal: Negative for nausea, vomiting and abdominal pain.  Endocrine: Positive for polydipsia.  Musculoskeletal: Negative for neck pain and neck stiffness.  Neurological: Positive for headaches. Negative for dizziness, tremors, seizures, syncope, facial asymmetry, speech difficulty, weakness and numbness.  Psychiatric/Behavioral: Negative for behavioral problems, confusion and agitation. The patient is not nervous/anxious.     Past Medical History  Diagnosis Date  . Depression   . Anxiety   . GERD (gastroesophageal reflux disease)   . Hyperlipidemia     Elevated, but never taken medication  . Hypertension   . Cancer 2008    skin cancer, BCC  . Constipation 11/14/2014  . Obesity 11/14/2014    History   Social History  . Marital Status: Married   Spouse Name: N/A  . Number of Children: N/A  . Years of Education: 12   Occupational History  . claims representative    Social History Main Topics  . Smoking status: Never Smoker   . Smokeless tobacco: Not on file  . Alcohol Use: No  . Drug Use: No  . Sexual Activity: Yes     Comment: works at [employer] off in [employer], minimize carbs., lives with husband   Other Topics Concern  . Not on file   Social History Narrative    Past Surgical History  Procedure Laterality Date  . Abdominal hysterectomy  2010  . Ectopic pregnancy surgery    . Breast biopsy      right, 1996  . Tubal ligation      on right    Family History  Problem Relation Age of Onset  . Heart disease Mother   . Diabetes Mother   . Depression Mother   . Diabetes Father   . Cancer Maternal Grandmother     throat  . Heart disease Maternal Grandmother   . Cancer Paternal Grandmother     lung  . Heart disease Paternal Grandmother   . Hypertension Sister     as a teenager    Allergies  Allergen Reactions  . Oxycodone Itching    Current Outpatient Prescriptions on File Prior to Visit  Medication Sig Dispense Refill  . polyethylene glycol (MIRALAX / GLYCOLAX) packet Take 17 g by mouth daily as needed.  No current facility-administered medications on file prior to visit.    BP 165/96 mmHg  Pulse 70  Temp(Src) 98.2 F (36.8 C) (Oral)  Ht 5\' 3"  (1.6 m)  Wt 179 lb 9.6 oz (81.466 kg)  BMI 31.82 kg/m2  SpO2 100%       Objective:   Physical Exam  General Mental Status- Alert. General Appearance- Not in acute distress.   Skin General: Color- Normal Color. Moisture- Normal Moisture.  Neck Carotid Arteries- Normal color. Moisture- Normal Moisture. No carotid bruits. No JVD.  Chest and Lung Exam Auscultation: Breath Sounds:-Normal. CTA.  Cardiovascular Auscultation:Rythm- Regular,Rate, Rhythm, Murmurs & Other Heart Sounds:Auscultation of the heart reveals- No  Murmurs.    Neurologic Cranial Nerve exam:- CN III-XII intact(No nystagmus), symmetric smile. Drift Test:- No drift. Romberg Exam:- Negative.  Heal to Toe Gait exam:-Normal. Finger to Nose:- Normal/Intact Strength:- 5/5 equal and symmetric strength both upper and lower extremities.  Tmj joints -mild tender to palpation. But on mastication no crepitus or popping.      Assessment & Plan:

## 2014-12-08 NOTE — Assessment & Plan Note (Signed)
Recent increase with moderate symptom but neg neurologic exam. Restart enalapril/hctz. Banana every other day to keep k up.  If you loose weight and bp come under control again then may stop.

## 2014-12-10 ENCOUNTER — Emergency Department (HOSPITAL_BASED_OUTPATIENT_CLINIC_OR_DEPARTMENT_OTHER)
Admission: EM | Admit: 2014-12-10 | Discharge: 2014-12-10 | Disposition: A | Discharge status: Home / Self Care | Payer: Federal, State, Local not specified - PPO | Attending: Emergency Medicine | Admitting: Emergency Medicine

## 2014-12-10 ENCOUNTER — Telehealth: Payer: Self-pay | Admitting: Family Medicine

## 2014-12-10 ENCOUNTER — Encounter (HOSPITAL_BASED_OUTPATIENT_CLINIC_OR_DEPARTMENT_OTHER): Payer: Self-pay | Admitting: *Deleted

## 2014-12-10 DIAGNOSIS — Z85828 Personal history of other malignant neoplasm of skin: Secondary | ICD-10-CM | POA: Insufficient documentation

## 2014-12-10 DIAGNOSIS — Z8659 Personal history of other mental and behavioral disorders: Secondary | ICD-10-CM | POA: Insufficient documentation

## 2014-12-10 DIAGNOSIS — E669 Obesity, unspecified: Secondary | ICD-10-CM | POA: Diagnosis not present

## 2014-12-10 DIAGNOSIS — R110 Nausea: Secondary | ICD-10-CM | POA: Insufficient documentation

## 2014-12-10 DIAGNOSIS — Z8719 Personal history of other diseases of the digestive system: Secondary | ICD-10-CM | POA: Diagnosis not present

## 2014-12-10 DIAGNOSIS — I10 Essential (primary) hypertension: Secondary | ICD-10-CM | POA: Diagnosis not present

## 2014-12-10 DIAGNOSIS — Z79899 Other long term (current) drug therapy: Secondary | ICD-10-CM | POA: Diagnosis not present

## 2014-12-10 DIAGNOSIS — R519 Headache, unspecified: Secondary | ICD-10-CM

## 2014-12-10 DIAGNOSIS — R51 Headache: Secondary | ICD-10-CM | POA: Insufficient documentation

## 2014-12-10 MED ORDER — SODIUM CHLORIDE 0.9 % IV BOLUS (SEPSIS)
1000.0000 mL | Freq: Once | INTRAVENOUS | Status: AC
  Administered 2014-12-10: 1000 mL via INTRAVENOUS

## 2014-12-10 MED ORDER — DIPHENHYDRAMINE HCL 50 MG/ML IJ SOLN
25.0000 mg | Freq: Once | INTRAMUSCULAR | Status: AC
  Administered 2014-12-10: 25 mg via INTRAVENOUS
  Filled 2014-12-10: qty 1

## 2014-12-10 MED ORDER — PROCHLORPERAZINE EDISYLATE 5 MG/ML IJ SOLN
10.0000 mg | Freq: Four times a day (QID) | INTRAMUSCULAR | Status: DC | PRN
  Administered 2014-12-10: 10 mg via INTRAVENOUS
  Filled 2014-12-10: qty 2

## 2014-12-10 NOTE — Telephone Encounter (Signed)
Relation to pt: self  Call back number: [phone] Pharmacy: [id number] - [place], [place] [phone] (Phone) [phone] (Fax)          Reason for call:  Patient was last seen 12/08/14 head aches have not improved. Please advise

## 2014-12-10 NOTE — ED Notes (Signed)
Headache. She was seen by her MD 2 days for same and restarted on BP medication. Pain went away but returned today.

## 2014-12-10 NOTE — ED Provider Notes (Signed)
CSN: [id number]     Arrival date & time 12/10/14  1526 History   First MD Initiated Contact with Patient 12/10/14 1557     Chief Complaint  Patient presents with  . Headache     (Consider location/radiation/quality/duration/timing/severity/associated sxs/prior Treatment) Patient is a 44 y.o. female presenting with headaches.  Headache Pain location:  R parietal Quality:  Dull ("pressure") Radiates to: generalize. Severity currently:  8/10 Severity at highest:  8/10 Onset quality:  Gradual Duration:  2 weeks Timing:  Intermittent Progression:  Waxing and waning Chronicity:  New Similar to prior headaches: yes (typically not as severe as this, went to ED one time many years ago for HA)   Context: bright light and loud noise   Relieved by:  Resting in a darkened room Worsened by:  Light and sound Ineffective treatments: blood pressure medications initiated by pcp 2 days ago. Associated symptoms: nausea   Associated symptoms: no abdominal pain, no back pain, no blurred vision, no cough, no diarrhea, no dizziness, no eye pain, no fatigue, no fever, no focal weakness, no loss of balance, no neck pain, no numbness, no sore throat, no syncope, no URI, no visual change and no weakness   Risk factors: no family hx of SAH     Past Medical History  Diagnosis Date  . Depression   . Anxiety   . GERD (gastroesophageal reflux disease)   . Hyperlipidemia     Elevated, but never taken medication  . Hypertension   . Cancer 2008    skin cancer, BCC  . Constipation 11/14/2014  . Obesity 11/14/2014   Past Surgical History  Procedure Laterality Date  . Abdominal hysterectomy  2010  . Ectopic pregnancy surgery    . Breast biopsy      right, 1996  . Tubal ligation      on right   Family History  Problem Relation Age of Onset  . Heart disease Mother   . Diabetes Mother   . Depression Mother   . Diabetes Father   . Cancer Maternal Grandmother     throat  . Heart disease Maternal  Grandmother   . Cancer Paternal Grandmother     lung  . Heart disease Paternal Grandmother   . Hypertension Sister     as a teenager   History  Substance Use Topics  . Smoking status: Never Smoker   . Smokeless tobacco: Not on file  . Alcohol Use: No   OB History    No data available     Review of Systems  Constitutional: Negative for fever and fatigue.  HENT: Negative for sore throat.   Eyes: Negative for blurred vision, pain and visual disturbance.  Respiratory: Negative for cough and shortness of breath.   Cardiovascular: Negative for chest pain and syncope.  Gastrointestinal: Positive for nausea. Negative for abdominal pain and diarrhea.  Genitourinary: Negative for difficulty urinating.  Musculoskeletal: Negative for back pain and neck pain.  Skin: Negative for rash.  Neurological: Positive for headaches. Negative for dizziness, focal weakness, syncope, facial asymmetry, speech difficulty, weakness, numbness and loss of balance.      Allergies  Oxycodone  Home Medications   Prior to Admission medications   Medication Sig Start Date End Date Taking? Authorizing Provider  Enalapril-Hydrochlorothiazide 5-12.5 MG per tablet Take 1 tablet by mouth daily. 12/08/14   Percell Miller Saguier, PA-C  polyethylene glycol (MIRALAX / GLYCOLAX) packet Take 17 g by mouth daily as needed.    Historical Provider, MD  BP 133/77 mmHg  Pulse 70  Temp(Src) 98.5 F (36.9 C) (Oral)  Resp 18  Ht 5\' 3"  (1.6 m)  Wt 175 lb (79.379 kg)  BMI 31.01 kg/m2  SpO2 100% Physical Exam  Constitutional: She is oriented to person, place, and time. She appears well-developed and well-nourished. No distress.  HENT:  Head: Normocephalic and atraumatic.  Mouth/Throat: No oropharyngeal exudate.  Eyes: Conjunctivae and EOM are normal. Pupils are equal, round, and reactive to light.  Neck: Normal range of motion.  Cardiovascular: Normal rate, regular rhythm, normal heart sounds and intact distal pulses.   Exam reveals no gallop and no friction rub.   No murmur heard. Pulmonary/Chest: Effort normal and breath sounds normal. No respiratory distress. She has no wheezes. She has no rales.  Abdominal: Soft. She exhibits no distension. There is no tenderness. There is no guarding.  Musculoskeletal: She exhibits no edema or tenderness.  Neurological: She is alert and oriented to person, place, and time. She has normal strength. No cranial nerve deficit or sensory deficit. Coordination and gait normal. GCS eye subscore is 4. GCS verbal subscore is 5. GCS motor subscore is 6.  Skin: Skin is warm and dry. No rash noted. She is not diaphoretic. No erythema.  Nursing note and vitals reviewed.   ED Course  Procedures (including critical care time) Labs Review Labs Reviewed - No data to display  Imaging Review No results found.   EKG Interpretation None      MDM   Final diagnoses:  Acute nonintractable headache, unspecified headache type   44yo female with history of depression, anxeity, GERD, hyperlipidemia, newly diagnosed hypertension presents with concern of headache.  Headache began slowly, no trauma, no fevers, and normal neurologic exam and have low suspicion for [facility], SDH or meningitis.  Patient was given compazine and benadryl with improvement in headache.  HA worse with loud sounds/lights and pt with hx of one similar prior ha in past and it may represent migraine.  Patient discharged in stable condition with understanding of reasons to return.    Gareth Morgan, MD 12/11/14 1250

## 2014-12-12 NOTE — Telephone Encounter (Signed)
Patient see in ED with HA on 7/15 please reach out to patient and see if her HA is better. Her BP was better in ED so if she still has HA we should probably refer to neurology for further care. We can also see her if she is in pain still while she gets neuro appt

## 2014-12-13 NOTE — Telephone Encounter (Signed)
Called patient, no answer 

## 2014-12-13 NOTE — Telephone Encounter (Signed)
Patient went to ED. States she was diagnosed with Migraine. States she is feeling much better now and doesn't need referral to [facility].

## 2014-12-13 NOTE — Telephone Encounter (Signed)
Called the patient no answer and no voice mail to leave a message.

## 2014-12-22 ENCOUNTER — Encounter: Payer: Federal, State, Local not specified - PPO | Admitting: Medical

## 2014-12-22 NOTE — Progress Notes (Signed)
This encounter was created in error - please disregard.

## 2014-12-29 ENCOUNTER — Telehealth: Payer: Self-pay | Admitting: Family Medicine

## 2014-12-29 NOTE — Telephone Encounter (Signed)
Pt was no show 12/22/14 2:45pm, follow up for bp, pt has not rescheduled, called pt but unable to leave msg, charge for no show?

## 2014-12-29 NOTE — Telephone Encounter (Signed)
No charge but call and ask her to come in for bp check.

## 2014-12-31 NOTE — Telephone Encounter (Signed)
Tried calling again to get pt to reschedule but VM is not setup.

## 2014-12-31 NOTE — Telephone Encounter (Signed)
No charge. 

## 2015-01-30 ENCOUNTER — Other Ambulatory Visit: Payer: Self-pay | Admitting: Medical

## 2015-02-01 ENCOUNTER — Other Ambulatory Visit: Payer: Self-pay

## 2015-02-01 NOTE — Telephone Encounter (Signed)
Give 2 week supply only, past due for follow up. Thanks.

## 2015-02-02 NOTE — Telephone Encounter (Signed)
Two week supply sent in to pharmacy. Pt is past due for appt. Please call and schedule an office visit.

## 2015-02-03 MED ORDER — ENALAPRIL-HYDROCHLOROTHIAZIDE 5-12.5 MG PO TABS: 1.0000 | ORAL_TABLET | Freq: Every day | ORAL | Status: DC

## 2015-02-03 NOTE — Telephone Encounter (Signed)
2 wks of bp med given. She needs to follow up for bp check so we can be sure she is on correct med/dose.

## 2015-02-03 NOTE — Telephone Encounter (Signed)
Please call pt to schedule a follow up for blood pressure.

## 2015-02-07 NOTE — Telephone Encounter (Signed)
Please call pt to have a follow up appt for her blood pressure.

## 2015-02-07 NOTE — Telephone Encounter (Signed)
Left message for pt to call back  °

## 2015-02-08 NOTE — Telephone Encounter (Signed)
No voicemail could not advise patient of message below

## 2015-05-29 HISTORY — PX: SHOULDER OPEN ROTATOR CUFF REPAIR: SHX2407

## 2015-08-23 ENCOUNTER — Ambulatory Visit (INDEPENDENT_AMBULATORY_CARE_PROVIDER_SITE_OTHER): Payer: Federal, State, Local not specified - PPO | Admitting: Family Medicine

## 2015-08-23 ENCOUNTER — Encounter: Payer: Self-pay | Admitting: Family Medicine

## 2015-08-23 VITALS — BP 132/90 | HR 88 | Temp 98.4°F | Ht 63.0 in | Wt 195.0 lb

## 2015-08-23 DIAGNOSIS — Z780 Asymptomatic menopausal state: Secondary | ICD-10-CM | POA: Insufficient documentation

## 2015-08-23 DIAGNOSIS — E538 Deficiency of other specified B group vitamins: Secondary | ICD-10-CM

## 2015-08-23 DIAGNOSIS — R739 Hyperglycemia, unspecified: Secondary | ICD-10-CM | POA: Diagnosis not present

## 2015-08-23 DIAGNOSIS — E785 Hyperlipidemia, unspecified: Secondary | ICD-10-CM

## 2015-08-23 DIAGNOSIS — G4700 Insomnia, unspecified: Secondary | ICD-10-CM

## 2015-08-23 DIAGNOSIS — E559 Vitamin D deficiency, unspecified: Secondary | ICD-10-CM

## 2015-08-23 DIAGNOSIS — Z0000 Encounter for general adult medical examination without abnormal findings: Secondary | ICD-10-CM | POA: Diagnosis not present

## 2015-08-23 DIAGNOSIS — R51 Headache: Secondary | ICD-10-CM

## 2015-08-23 DIAGNOSIS — R519 Headache, unspecified: Secondary | ICD-10-CM

## 2015-08-23 DIAGNOSIS — Z01419 Encounter for gynecological examination (general) (routine) without abnormal findings: Secondary | ICD-10-CM

## 2015-08-23 DIAGNOSIS — E669 Obesity, unspecified: Secondary | ICD-10-CM

## 2015-08-23 DIAGNOSIS — I10 Essential (primary) hypertension: Secondary | ICD-10-CM

## 2015-08-23 DIAGNOSIS — K219 Gastro-esophageal reflux disease without esophagitis: Secondary | ICD-10-CM

## 2015-08-23 DIAGNOSIS — F411 Generalized anxiety disorder: Secondary | ICD-10-CM

## 2015-08-23 HISTORY — DX: Vitamin D deficiency, unspecified: E55.9

## 2015-08-23 HISTORY — DX: Asymptomatic menopausal state: Z78.0

## 2015-08-23 HISTORY — DX: Deficiency of other specified B group vitamins: E53.8

## 2015-08-23 MED ORDER — BUTALBITAL-APAP-CAFFEINE 50-325-40 MG PO TABS: 1.0000 | ORAL_TABLET | Freq: Four times a day (QID) | ORAL | Status: DC | PRN

## 2015-08-23 NOTE — Assessment & Plan Note (Signed)
Encouraged increased hydration and fiber in diet. Daily probiotics. If bowels not moving can use MOM 2 tbls po in 4 oz of warm prune juice by mouth every 2-3 days. If no results then repeat in 4 hours with  Dulcolax suppository pr, may repeat again in 4 more hours as needed. Seek care if symptoms worsen. Consider daily Miralax and/or Dulcolax if symptoms persist.  

## 2015-08-23 NOTE — Progress Notes (Signed)
Pre visit review using our clinic review tool, if applicable. No additional management support is needed unless otherwise documented below in the visit note. 

## 2015-08-23 NOTE — Assessment & Plan Note (Signed)
No longer taking supplements, check levels today

## 2015-08-23 NOTE — Assessment & Plan Note (Signed)
Check leve today

## 2015-08-23 NOTE — Patient Instructions (Addendum)
Try otc melatonin for sleep.  Soak feet in distilled white vineger and  Apply vicks vapor rub to toe afterwards to help treat fungus on toe. Hypertension Hypertension, commonly called high blood pressure, is when the force of blood pumping through your arteries is too strong. Your arteries are the blood vessels that carry blood from your heart throughout your body. A blood pressure reading consists of a higher number over a lower number, such as 110/72. The higher number (systolic) is the pressure inside your arteries when your heart pumps. The lower number (diastolic) is the pressure inside your arteries when your heart relaxes. Ideally you want your blood pressure below 120/80. Hypertension forces your heart to work harder to pump blood. Your arteries may become narrow or stiff. Having untreated or uncontrolled hypertension can cause heart attack, stroke, kidney disease, and other problems. RISK FACTORS Some risk factors for high blood pressure are controllable. Others are not.  Risk factors you cannot control include:   Race. You may be at higher risk if you are African American.  Age. Risk increases with age.  Gender. Men are at higher risk than women before age 26 years. After age 53, women are at higher risk than men. Risk factors you can control include:  Not getting enough exercise or physical activity.  Being overweight.  Getting too much fat, sugar, calories, or salt in your diet.  Drinking too much alcohol. SIGNS AND SYMPTOMS Hypertension does not usually cause signs or symptoms. Extremely high blood pressure (hypertensive crisis) may cause headache, anxiety, shortness of breath, and nosebleed. DIAGNOSIS To check if you have hypertension, your health care provider will measure your blood pressure while you are seated, with your arm held at the level of your heart. It should be measured at least twice using the same arm. Certain conditions can cause a difference in blood pressure  between your right and left arms. A blood pressure reading that is higher than normal on one occasion does not mean that you need treatment. If it is not clear whether you have high blood pressure, you may be asked to return on a different day to have your blood pressure checked again. Or, you may be asked to monitor your blood pressure at home for 1 or more weeks. TREATMENT Treating high blood pressure includes making lifestyle changes and possibly taking medicine. Living a healthy lifestyle can help lower high blood pressure. You may need to change some of your habits. Lifestyle changes may include:  Following the DASH diet. This diet is high in fruits, vegetables, and whole grains. It is low in salt, red meat, and added sugars.  Keep your sodium intake below 2,300 mg per day.  Getting at least 30-45 minutes of aerobic exercise at least 4 times per week.  Losing weight if necessary.  Not smoking.  Limiting alcoholic beverages.  Learning ways to reduce stress. Your health care provider may prescribe medicine if lifestyle changes are not enough to get your blood pressure under control, and if one of the following is true:  You are 14-59 years of age and your systolic blood pressure is above 140.  You are 84 years of age or older, and your systolic blood pressure is above 150.  Your diastolic blood pressure is above 90.  You have diabetes, and your systolic blood pressure is over [id number] or your diastolic blood pressure is over 90.  You have kidney disease and your blood pressure is above 140/90.  You have heart disease  and your blood pressure is above 140/90. Your personal target blood pressure may vary depending on your medical conditions, your age, and other factors. HOME CARE INSTRUCTIONS  Have your blood pressure rechecked as directed by your health care provider.   Take medicines only as directed by your health care provider. Follow the directions carefully. Blood pressure  medicines must be taken as prescribed. The medicine does not work as well when you skip doses. Skipping doses also puts you at risk for problems.  Do not smoke.   Monitor your blood pressure at home as directed by your health care provider. SEEK MEDICAL CARE IF:   You think you are having a reaction to medicines taken.  You have recurrent headaches or feel dizzy.  You have swelling in your ankles.  You have trouble with your vision. SEEK IMMEDIATE MEDICAL CARE IF:  You develop a severe headache or confusion.  You have unusual weakness, numbness, or feel faint.  You have severe chest or abdominal pain.  You vomit repeatedly.  You have trouble breathing. MAKE SURE YOU:   Understand these instructions.  Will watch your condition.  Will get help right away if you are not doing well or get worse.   This information is not intended to replace advice given to you by your health care provider. Make sure you discuss any questions you have with your health care provider.   Document Released: 05/14/2005 Document Revised: 09/28/2014 Document Reviewed: 03/06/2013 Elsevier Interactive Patient Education [employer].

## 2015-08-23 NOTE — Assessment & Plan Note (Signed)
Encouraged heart healthy diet, increase exercise, avoid trans fats, consider a krill oil cap daily 

## 2015-08-23 NOTE — Progress Notes (Signed)
Subjective:    Patient ID: Julie Bishop, female    DOB: [birth year], 45 y.o.   MRN: [id number]  Chief Complaint  Patient presents with  . Headache    HPI Patient is in today for concerns with headaches that's waking her up at night and its left sided head pain, no light sensitivity, also has some concern with right arm pain.  Has had a past hx of hypertension and presents with concerns withe more elevated blood pressure.  Patient has some concerns with craving sugars.  Patient has been recently diagnosed with adjustment disorder form another MD due to a lot of recent changes.  Patient reports feeling of more frequent  loss of memory and has been having night sweats.     Past Medical History  Diagnosis Date  . Depression   . Anxiety   . GERD (gastroesophageal reflux disease)   . Hyperlipidemia     Elevated, but never taken medication  . Hypertension   . Cancer ([place]) 2008    skin cancer, BCC  . Constipation 11/14/2014  . Obesity 11/14/2014  . Generalized anxiety disorder   . Vitamin D deficiency 08/23/2015  . Vitamin B12 deficiency 08/23/2015  . Preventative health care 08/23/2015  . Post-menopause 08/23/2015  . Insomnia 08/24/2015  . Headache 08/24/2015    Past Surgical History  Procedure Laterality Date  . Abdominal hysterectomy  2010  . Ectopic pregnancy surgery    . Breast biopsy      right, 1996  . Tubal ligation      on right    Family History  Problem Relation Age of Onset  . Heart disease Mother   . Diabetes Mother   . Depression Mother   . Diabetes Father   . Cancer Maternal Grandmother     throat  . Heart disease Maternal Grandmother   . Cancer Paternal Grandmother     lung  . Heart disease Paternal Grandmother   . Hypertension Sister     as a teenager    Social History   Social History  . Marital Status: Married    Spouse Name: N/A  . Number of Children: N/A  . Years of Education: 12   Occupational History  . claims representative     Social History Main Topics  . Smoking status: Never Smoker   . Smokeless tobacco: Not on file  . Alcohol Use: No  . Drug Use: No  . Sexual Activity: Yes     Comment: works at [employer] off in [place], minimize carbs., lives with husband   Other Topics Concern  . Not on file   Social History Narrative    Outpatient Prescriptions Prior to Visit  Medication Sig Dispense Refill  . Enalapril-Hydrochlorothiazide 5-12.5 MG per tablet TAKE 1 TABLET BY MOUTH EVERY DAY 14 tablet 0  . Enalapril-Hydrochlorothiazide 5-12.5 MG per tablet Take 1 tablet by mouth daily. PATIENT WILL NEED TO BE SEEN BY PROVIDER FOR ADDITIONAL REFILLS. 14 tablet 0  . polyethylene glycol (MIRALAX / GLYCOLAX) packet Take 17 g by mouth daily as needed.     No facility-administered medications prior to visit.    Allergies  Allergen Reactions  . Oxycodone Itching    Review of Systems  Constitutional: Negative for fever and malaise/fatigue.  HENT: Negative for congestion.   Eyes: Negative for blurred vision.  Respiratory: Negative for shortness of breath.   Cardiovascular: Negative for chest pain, palpitations and leg swelling.  Gastrointestinal: Negative for nausea, abdominal pain and  blood in stool.  Genitourinary: Negative for dysuria and frequency.  Musculoskeletal: Negative for falls.  Skin: Negative for rash.  Neurological: Positive for headaches. Negative for dizziness and loss of consciousness.  Endo/Heme/Allergies: Negative for environmental allergies.  Psychiatric/Behavioral: Negative for depression. The patient is not nervous/anxious.        Objective:    Physical Exam  Constitutional: She is oriented to person, place, and time. She appears well-developed and well-nourished. No distress.  HENT:  Head: Normocephalic and atraumatic.  Eyes: Conjunctivae are normal.  Neck: Neck supple. No thyromegaly present.  Cardiovascular: Normal rate, regular rhythm and normal heart sounds.   No murmur  heard. Pulmonary/Chest: Effort normal and breath sounds normal. No respiratory distress.  Abdominal: Soft. Bowel sounds are normal. She exhibits no distension and no mass. There is no tenderness.  Musculoskeletal: She exhibits no edema.  Lymphadenopathy:    She has no cervical adenopathy.  Neurological: She is alert and oriented to person, place, and time.  Skin: Skin is warm and dry.  Psychiatric: She has a normal mood and affect. Her behavior is normal.    BP 132/90 mmHg  Pulse 88  Temp(Src) 98.4 F (36.9 C) (Oral)  Ht 5\' 3"  (1.6 m)  Wt 195 lb (88.451 kg)  BMI 34.55 kg/m2  SpO2 97% Wt Readings from Last 3 Encounters:  08/23/15 195 lb (88.451 kg)  12/10/14 175 lb (79.379 kg)  12/08/14 179 lb 9.6 oz (81.466 kg)     Lab Results  Component Value Date   WBC 5.2 11/11/2014   HGB 13.6 11/11/2014   HCT 40.7 11/11/2014   PLT 247.0 11/11/2014   GLUCOSE 75 11/11/2014   CHOL 213* 11/11/2014   TRIG 67.0 11/11/2014   HDL 89.00 11/11/2014   LDLCALC 111* 11/11/2014   ALT 10 11/11/2014   AST 13 11/11/2014   NA 137 11/11/2014   K 3.9 11/11/2014   CL 104 11/11/2014   CREATININE 0.73 11/11/2014   BUN 17 11/11/2014   CO2 27 11/11/2014   TSH 2.67 11/11/2014   HGBA1C 5.2 08/23/2015    Lab Results  Component Value Date   TSH 2.67 11/11/2014   Lab Results  Component Value Date   WBC 5.2 11/11/2014   HGB 13.6 11/11/2014   HCT 40.7 11/11/2014   MCV 89.7 11/11/2014   PLT 247.0 11/11/2014   Lab Results  Component Value Date   NA 137 11/11/2014   K 3.9 11/11/2014   CO2 27 11/11/2014   GLUCOSE 75 11/11/2014   BUN 17 11/11/2014   CREATININE 0.73 11/11/2014   BILITOT 0.4 11/11/2014   ALKPHOS 36* 11/11/2014   AST 13 11/11/2014   ALT 10 11/11/2014   PROT 6.9 11/11/2014   ALBUMIN 4.1 11/11/2014   CALCIUM 9.2 11/11/2014   GFR 92.22 11/11/2014   Lab Results  Component Value Date   CHOL 213* 11/11/2014   Lab Results  Component Value Date   HDL 89.00 11/11/2014   Lab  Results  Component Value Date   LDLCALC 111* 11/11/2014   Lab Results  Component Value Date   TRIG 67.0 11/11/2014   Lab Results  Component Value Date   CHOLHDL 2 11/11/2014   Lab Results  Component Value Date   HGBA1C 5.2 08/23/2015       Assessment & Plan:   Problem List Items Addressed This Visit    Generalized anxiety disorder    Has been diagnosed with Adjustment disorder secondary to her move and job adjustments. She is  following with [facility] and is on Cymbalta and Lorazepam prn doing better.      GERD (gastroesophageal reflux disease)    Avoid offending foods, start probiotics. Do not eat large meals in late evening and consider raising head of bed.       Headache    Encouraged increased hydration, 64 ounces of clear fluids daily. Minimize alcohol and caffeine. Eat small frequent meals with lean proteins and complex carbs. Avoid high and low blood sugars. Get adequate sleep, 7-8 hours a night. Needs exercise daily preferably in the morning. Uses Excedrine Migraine prn with decent results, may also try Fioricet and check labs if no improvement then may need referral to neurology      Relevant Medications   DULoxetine (CYMBALTA) 60 MG capsule   butalbital-acetaminophen-caffeine (FIORICET) 50-325-40 MG tablet   Hyperlipidemia    Encouraged heart healthy diet, increase exercise, avoid trans fats, consider a krill oil cap daily      Hypertension - Primary    Well controlled, no changes to meds. Encouraged heart healthy diet such as the DASH diet and exercise as tolerated.       Insomnia    Encouraged good sleep hygiene such as dark, quiet room. No blue/green glowing lights such as computer screens in bedroom. No alcohol or stimulants in evening. Cut down on caffeine as able. Regular exercise is helpful but not just prior to bed time. Encouraged to try Melatonin prn      Obesity    Encouraged DASH diet, decrease po intake and increase exercise as  tolerated. Needs 7-8 hours of sleep nightly. Avoid trans fats, eat small, frequent meals every 4-5 hours with lean proteins, complex carbs and healthy fats. Minimize simple carbs      Preventative health care   Relevant Orders   Hemoglobin A1c (Completed)   Ambulatory referral to Obstetrics / Gynecology   Vitamin B12 deficiency    Check leve today      Relevant Orders   Vitamin B12 (Completed)   Vitamin D deficiency    No longer taking supplements, check levels today      Relevant Orders   VITAMIN D 25 Hydroxy (Vit-D Deficiency, Fractures) (Completed)    Other Visit Diagnoses    Encounter for routine pelvic examination        Relevant Orders    Ambulatory referral to Obstetrics / Gynecology    Hyperglycemia        Relevant Orders    Hemoglobin A1c (Completed)       I have discontinued Ms. Browder's polyethylene glycol, Enalapril-Hydrochlorothiazide, and Enalapril-Hydrochlorothiazide. I am also having her start on butalbital-acetaminophen-caffeine. Additionally, I am having her maintain her DULoxetine and LORazepam.  Meds ordered this encounter  Medications  . DULoxetine (CYMBALTA) 60 MG capsule    Sig: Take 1 capsule by mouth daily.  Marland Kitchen LORazepam (ATIVAN) 0.5 MG tablet    Sig: Take 0.5 mg by mouth daily.   . butalbital-acetaminophen-caffeine (FIORICET) 50-325-40 MG tablet    Sig: Take 1-2 tablets by mouth every 6 (six) hours as needed for headache.    Dispense:  30 tablet    Refill:  0     Penni Homans, MD

## 2015-08-23 NOTE — Assessment & Plan Note (Signed)
Well controlled, no changes to meds. Encouraged heart healthy diet such as the DASH diet and exercise as tolerated.  °

## 2015-08-24 ENCOUNTER — Encounter: Payer: Self-pay | Admitting: Family Medicine

## 2015-08-24 DIAGNOSIS — G4700 Insomnia, unspecified: Secondary | ICD-10-CM | POA: Insufficient documentation

## 2015-08-24 DIAGNOSIS — R519 Headache, unspecified: Secondary | ICD-10-CM | POA: Insufficient documentation

## 2015-08-24 DIAGNOSIS — R51 Headache: Secondary | ICD-10-CM

## 2015-08-24 HISTORY — DX: Insomnia, unspecified: G47.00

## 2015-08-24 HISTORY — DX: Headache, unspecified: R51.9

## 2015-08-24 LAB — VITAMIN D 25 HYDROXY (VIT D DEFICIENCY, FRACTURES): VITD: 20.43 ng/mL — ABNORMAL LOW (ref 30.00–100.00)

## 2015-08-24 LAB — HEMOGLOBIN A1C: HEMOGLOBIN A1C: 5.2 % (ref 4.6–6.5)

## 2015-08-24 LAB — VITAMIN B12: VITAMIN B 12: 232 pg/mL (ref 211–911)

## 2015-08-24 NOTE — Assessment & Plan Note (Signed)
Has been diagnosed with Adjustment disorder secondary to her move and job adjustments. She is following with [facility] and is on Cymbalta and Lorazepam prn doing better.

## 2015-08-24 NOTE — Assessment & Plan Note (Signed)
Encouraged good sleep hygiene such as dark, quiet room. No blue/green glowing lights such as computer screens in bedroom. No alcohol or stimulants in evening. Cut down on caffeine as able. Regular exercise is helpful but not just prior to bed time. Encouraged to try Melatonin prn

## 2015-08-24 NOTE — Assessment & Plan Note (Signed)
Encouraged increased hydration, 64 ounces of clear fluids daily. Minimize alcohol and caffeine. Eat small frequent meals with lean proteins and complex carbs. Avoid high and low blood sugars. Get adequate sleep, 7-8 hours a night. Needs exercise daily preferably in the morning. Uses Excedrine Migraine prn with decent results, may also try Fioricet and check labs if no improvement then may need referral to neurology

## 2015-08-24 NOTE — Assessment & Plan Note (Signed)
Avoid offending foods, start probiotics. Do not eat large meals in late evening and consider raising head of bed.  

## 2015-08-24 NOTE — Assessment & Plan Note (Signed)
Encouraged DASH diet, decrease po intake and increase exercise as tolerated. Needs 7-8 hours of sleep nightly. Avoid trans fats, eat small, frequent meals every 4-5 hours with lean proteins, complex carbs and healthy fats. Minimize simple carbs 

## 2015-08-25 ENCOUNTER — Other Ambulatory Visit: Payer: Self-pay | Admitting: Family Medicine

## 2015-08-25 MED ORDER — VITAMIN D (ERGOCALCIFEROL) 1.25 MG (50000 UNIT) PO CAPS: 50000.0000 [IU] | ORAL_CAPSULE | ORAL | Status: DC

## 2015-09-19 ENCOUNTER — Ambulatory Visit (HOSPITAL_BASED_OUTPATIENT_CLINIC_OR_DEPARTMENT_OTHER)
Admission: RE | Admit: 2015-09-19 | Discharge: 2015-09-19 | Disposition: A | Discharge status: Home / Self Care | Payer: Federal, State, Local not specified - PPO | Source: Ambulatory Visit | Attending: Obstetrics & Gynecology | Admitting: Obstetrics & Gynecology

## 2015-09-19 ENCOUNTER — Encounter: Payer: Federal, State, Local not specified - PPO | Admitting: Obstetrics & Gynecology

## 2015-09-19 ENCOUNTER — Encounter: Payer: Self-pay | Admitting: Obstetrics & Gynecology

## 2015-09-19 ENCOUNTER — Ambulatory Visit (INDEPENDENT_AMBULATORY_CARE_PROVIDER_SITE_OTHER): Payer: Federal, State, Local not specified - PPO | Admitting: Obstetrics & Gynecology

## 2015-09-19 VITALS — BP 142/77 | HR 77 | Ht 64.0 in | Wt 202.0 lb

## 2015-09-19 DIAGNOSIS — Z01419 Encounter for gynecological examination (general) (routine) without abnormal findings: Secondary | ICD-10-CM

## 2015-09-19 DIAGNOSIS — E669 Obesity, unspecified: Secondary | ICD-10-CM | POA: Diagnosis not present

## 2015-09-19 DIAGNOSIS — Z1231 Encounter for screening mammogram for malignant neoplasm of breast: Secondary | ICD-10-CM | POA: Diagnosis present

## 2015-09-19 DIAGNOSIS — Z90710 Acquired absence of both cervix and uterus: Secondary | ICD-10-CM

## 2015-09-19 NOTE — Progress Notes (Signed)
Patient ID: KOHARU BIBIAN, female   DOB: 1970/07/30, 45 y.o.   MRN: [id number] Subjective:     Julie Bishop is a 45 y.o. female here for a routine exam. [id number]. LMP 2008  Current complaints: no problems.  Pt does have a 'spot' on her breat that she wants eval. Married.  Currently sexually active.     Gynecologic History No LMP recorded. Patient has had a hysterectomy. Contraception: status post hysterectomy. Pelvic pain- fibroids and scar tissue.  right ovary left Last Pap: last PAP 2015. Results were: normal.  Had abnormal PAP >18 years ago.  S/p cryo x2.  Unsure if she had HPV testing.  Last mammogram: Apri 2015. Results were: normal  Obstetric History OB History  No data available   The following portions of the patient's history were reviewed and updated as appropriate: allergies, current medications, past family history, past medical history, past social history, past surgical history and problem list.  Review of Systems Pertinent items are noted in HPI.    Objective:   BP 142/77 mmHg  Pulse 77  Ht 5\' 4"  (1.626 m)  Wt 202 lb (91.627 kg)  BMI 34.66 kg/m2 General Appearance:    Alert, cooperative, no distress, appears stated age  Head:    Normocephalic, without obvious abnormality, atraumatic  Eyes:    conjunctiva/corneas clear, EOM's intact, both eyes  Ears:    Normal external ear canals, both ears  Nose:   Nares normal, septum midline, mucosa normal, no drainage    or sinus tenderness  Throat:   Lips, mucosa, and tongue normal; teeth and gums normal  Neck:   Supple, symmetrical, trachea midline, no adenopathy;    thyroid:  no enlargement/tenderness/nodules  Back:     Symmetric, no curvature, ROM normal, no CVA tenderness  Lungs:     Clear to auscultation bilaterally, respirations unlabored  Chest Wall:    No tenderness or deformity   Heart:    Regular rate and rhythm, S1 and S2 normal, no murmur, rub   or gallop  Breast Exam:    No tenderness, masses, or  nipple abnormality  Abdomen:     Soft, non-tender, bowel sounds active all four quadrants,    no masses, no organomegaly  Genitalia:    Normal female without lesion, discharge or tenderness; cervix and uterus surgically absent     Extremities:   Extremities normal, atraumatic, no cyanosis or edema  Pulses:   2+ and symmetric all extremities  Skin:   Skin color, texture, turgor normal, no rashes or lesions; tatoo on right foot     Assessment:    Healthy female exam.   S/p hyst obesity  Screening breat cancer- reviewed current screening guidelines  Plan:    Follow up in: 1 year.    Reviewed diet and exercise (d/c [place]) Screening mammogram No further PAP smears needed  Aivan Fillingim L. Harraway-Smith, M.D., Cherlynn June

## 2015-09-19 NOTE — Patient Instructions (Signed)
Mammogram A mammogram is an X-ray of the breasts that is done to check for abnormal changes. This procedure can screen for and detect any changes that may suggest breast cancer. A mammogram can also identify other changes and variations in the breast, such as:  Inflammation of the breast tissue (mastitis).  An infected area that contains a collection of pus (abscess).  A fluid-filled sac (cyst).  Fibrocystic changes. This is when breast tissue becomes denser, which can make the tissue feel rope-like or uneven under the skin.  Tumors that are not cancerous (benign). LET YOUR HEALTH CARE PROVIDER KNOW ABOUT:  Any allergies you have.  If you have breast implants.  If you have had previous breast disease, biopsy, or surgery.  If you are breastfeeding.  Any possibility that you could be pregnant, if this applies.  If you are younger than age 25.  If you have a family history of breast cancer. RISKS AND COMPLICATIONS Generally, this is a safe procedure. However, problems may occur, including:  Exposure to radiation. Radiation levels are very low with this test.  The results being misinterpreted.  The need for further tests.  The inability of the mammogram to detect certain cancers. BEFORE THE PROCEDURE  Schedule your test about 1-2 weeks after your menstrual period. This is usually when your breasts are the least tender.  If you have had a mammogram done at a different facility in the past, get the mammogram X-rays or have them sent to your current exam facility in order to compare them.  Wash your breasts and under your arms the day of the test.  Do not wear deodorants, perfumes, lotions, or powders anywhere on your body on the day of the test.  Remove any jewelry from your neck.  Wear clothes that you can change into and out of easily. PROCEDURE  You will undress from the waist up and put on a gown.  You will stand in front of the X-ray machine.  Each breast will  be placed between two plastic or glass plates. The plates will compress your breast for a few seconds. Try to stay as relaxed as possible during the procedure. This does not cause any harm to your breasts and any discomfort you feel will be very brief.  X-rays will be taken from different angles of each breast. The procedure may vary among health care providers and hospitals. AFTER THE PROCEDURE  The mammogram will be examined by a specialist (radiologist).  You may need to repeat certain parts of the test, depending on the quality of the images. This is commonly done if the radiologist needs a better view of the breast tissue.  Ask when your test results will be ready. Make sure you get your test results.  You may resume your normal activities.   This information is not intended to replace advice given to you by your health care provider. Make sure you discuss any questions you have with your health care provider.   Document Released: 05/11/2000 Document Revised: 02/02/2015 Document Reviewed: 07/23/2014 Elsevier Interactive Patient Education 2016 [employer]  

## 2015-11-03 ENCOUNTER — Ambulatory Visit (INDEPENDENT_AMBULATORY_CARE_PROVIDER_SITE_OTHER): Payer: Federal, State, Local not specified - PPO | Admitting: Medical

## 2015-11-03 ENCOUNTER — Encounter: Payer: Self-pay | Admitting: Medical

## 2015-11-03 VITALS — BP 148/98 | HR 83 | Temp 98.1°F | Ht 63.0 in | Wt 211.0 lb

## 2015-11-03 DIAGNOSIS — M6740 Ganglion, unspecified site: Secondary | ICD-10-CM | POA: Diagnosis not present

## 2015-11-03 DIAGNOSIS — M25571 Pain in right ankle and joints of right foot: Secondary | ICD-10-CM | POA: Diagnosis not present

## 2015-11-03 DIAGNOSIS — M2550 Pain in unspecified joint: Secondary | ICD-10-CM | POA: Diagnosis not present

## 2015-11-03 DIAGNOSIS — I10 Essential (primary) hypertension: Secondary | ICD-10-CM

## 2015-11-03 LAB — RHEUMATOID FACTOR: Rhuematoid fact SerPl-aCnc: <10 IU/mL (ref <=14)

## 2015-11-03 LAB — SEDIMENTATION RATE: Sed Rate: 19 mm/hr (ref 0–20)

## 2015-11-03 LAB — C-REACTIVE PROTEIN: CRP: 1.5 mg/dL (ref 0.5–20.0)

## 2015-11-03 MED ORDER — HYDROCHLOROTHIAZIDE 12.5 MG PO TABS: 12.5000 mg | ORAL_TABLET | Freq: Every day | ORAL | Status: DC

## 2015-11-03 NOTE — Progress Notes (Signed)
Pre visit review using our clinic review tool, if applicable. No additional management support is needed unless otherwise documented below in the visit note. 

## 2015-11-03 NOTE — Progress Notes (Signed)
Subjective:    Patient ID: Julie Bishop, female    DOB: [birth year], 45 y.o.   MRN: [id number]  HPI   Pt in for evaluation of bp. Evey time in past month has been elevated. BP has ranged 160/100 to levels like today 148/98. She estimated about 10 BP check over past month showed consistent elevation. Pt in past had htn but she stopped medication when she lost a lot of weight and bp was lowered.  Pt has rt shoulder pain. Being worked up for rotator cuff surgery.  Pt states just recently pain on top of foot and bottom of the foot.(but also has large ganglion cyst rt foot over 4th metatarsal area) Also some rt second digit mcp area pain for 3 years. But no other arthralgias. No trauma or fall preceded foot pain.  LMP- hysterectomy.    Review of Systems  Constitutional: Negative for chills and fatigue.  Respiratory: Negative for cough, choking, shortness of breath and wheezing.   Cardiovascular: Negative for chest pain and palpitations.  Gastrointestinal: Negative for abdominal pain.  Musculoskeletal:       Rt second digit mcp joint pain. Rt foot pain. See hpi.  Also some left hip area pain. She had this 2 wks ago. Went to [place] and now feels better.  Hematological: Negative for adenopathy. Does not bruise/bleed easily.  Psychiatric/Behavioral: Negative for confusion and agitation.    Past Medical History  Diagnosis Date  . Depression   . Anxiety   . GERD (gastroesophageal reflux disease)   . Hyperlipidemia     Elevated, but never taken medication  . Hypertension   . Cancer ([place]) 2008    skin cancer, BCC  . Constipation 11/14/2014  . Obesity 11/14/2014  . Generalized anxiety disorder   . Vitamin D deficiency 08/23/2015  . Vitamin B12 deficiency 08/23/2015  . Preventative health care 08/23/2015  . Post-menopause 08/23/2015  . Insomnia 08/24/2015  . Headache 08/24/2015     Social History   Social History  . Marital Status: Married    Spouse Name: N/A  .  Number of Children: N/A  . Years of Education: 12   Occupational History  . claims representative    Social History Main Topics  . Smoking status: Never Smoker   . Smokeless tobacco: Not on file  . Alcohol Use: No  . Drug Use: No  . Sexual Activity: Yes     Comment: works at [employer] off in [place], minimize carbs., lives with husband   Other Topics Concern  . Not on file   Social History Narrative    Past Surgical History  Procedure Laterality Date  . Abdominal hysterectomy  2010  . Ectopic pregnancy surgery    . Breast biopsy      right, 1996  . Tubal ligation      on right    Family History  Problem Relation Age of Onset  . Heart disease Mother   . Diabetes Mother   . Depression Mother   . Diabetes Father   . Cancer Maternal Grandmother     throat  . Heart disease Maternal Grandmother   . Cancer Paternal Grandmother     lung  . Heart disease Paternal Grandmother   . Hypertension Sister     as a teenager    Allergies  Allergen Reactions  . Oxycodone Itching    Current Outpatient Prescriptions on File Prior to Visit  Medication Sig Dispense Refill  . DULoxetine (CYMBALTA) 60 MG capsule  Take 1 capsule by mouth daily.    Marland Kitchen LORazepam (ATIVAN) 0.5 MG tablet Take 0.5 mg by mouth daily.     . vitamin B-12 (CYANOCOBALAMIN) 250 MCG tablet Take 250 mcg by mouth daily.    . Vitamin D, Ergocalciferol, (DRISDOL) 50000 units CAPS capsule Take 1 capsule (50,000 Units total) by mouth every 7 (seven) days. 4 capsule 4   No current facility-administered medications on file prior to visit.    BP 148/98 mmHg  Pulse 83  Temp(Src) 98.1 F (36.7 C) (Oral)  Ht 5\' 3"  (1.6 m)  Wt 211 lb (95.709 kg)  BMI 37.39 kg/m2  SpO2 98%       Objective:   Physical Exam  General Mental Status- Alert. General Appearance- Not in acute distress.   Skin General: Color- Normal Color. Moisture- Normal Moisture.  Neck Carotid Arteries- Normal color. Moisture- Normal  Moisture. No carotid bruits. No JVD.  Chest and Lung Exam Auscultation: Breath Sounds:-Normal.  Cardiovascular Auscultation:Rythm- Regular. Murmurs & Other Heart Sounds:Auscultation of the heart reveals- No Murmurs.  Abdomen Inspection:-Inspeection Normal. Palpation/Percussion:Note:No mass. Palpation and Percussion of the abdomen reveal- Non Tender, Non Distended + BS, no rebound or guarding.    Neurologic Cranial Nerve exam:- CN III-XII intact(No nystagmus), symmetric smile. Strength:- 5/5 equal and symmetric strength both upper and lower extremities.  Rt hand- faint tenderness on rom of rt second digit  In mcp region.(but no swelling of hand or joints) Rt foot- on plantar flexion had distal metatarsal pain. Also rt 5th metatatarsal mid region 2.5 x 1.5 cm ganglion cyst.  Rt hip- good rom. No crepitus. No pain presently.     Assessment & Plan:  For you htn rx hctz 12.5  mg tab. Then follow up in 2 wks for bp check. If bp still not controlled. Then may rx  Losartan/hctz combination. Will cmp.  For your joint pains will get arthritis panel.  Will go ahead refer to podiatrist to evaluate the ganglion cyst.  For pain can take tylenol. Explained try to avoid nsaids since may increase bp.  Follow up in 2 wks or as needed

## 2015-11-03 NOTE — Patient Instructions (Addendum)
For you htn rx hctz 12.5  mg tab. Then follow up in 2 wks for bp check. If bp still not controlled. Then may rx  Losartan/hctz combination. Will cmp.  For your joint pains will get arthritis panel.  Will go ahead refer to podiatrist to evaluate the ganglion cyst.  For pain can take tylenol. Explained try to avoid nsaids since may increase bp.  Follow up in 2 wks or as needed

## 2015-11-04 ENCOUNTER — Telehealth: Payer: Self-pay | Admitting: Medical

## 2015-11-04 DIAGNOSIS — M2550 Pain in unspecified joint: Secondary | ICD-10-CM

## 2015-11-04 DIAGNOSIS — R768 Other specified abnormal immunological findings in serum: Secondary | ICD-10-CM

## 2015-11-04 LAB — ANTI-NUCLEAR AB-TITER (ANA TITER): ANA Titer 1: 1:320 {titer} — ABNORMAL HIGH

## 2015-11-04 LAB — ANA: ANA: POSITIVE — AB

## 2015-11-04 NOTE — Telephone Encounter (Signed)
Referral made to rheumatolgist

## 2015-11-15 ENCOUNTER — Encounter: Payer: Self-pay | Admitting: Family Medicine

## 2015-11-15 ENCOUNTER — Ambulatory Visit (INDEPENDENT_AMBULATORY_CARE_PROVIDER_SITE_OTHER): Payer: Federal, State, Local not specified - PPO | Admitting: Family Medicine

## 2015-11-15 VITALS — BP 132/90 | HR 89 | Temp 98.1°F | Ht 63.0 in | Wt 211.6 lb

## 2015-11-15 DIAGNOSIS — E559 Vitamin D deficiency, unspecified: Secondary | ICD-10-CM | POA: Diagnosis not present

## 2015-11-15 DIAGNOSIS — E538 Deficiency of other specified B group vitamins: Secondary | ICD-10-CM

## 2015-11-15 DIAGNOSIS — M25511 Pain in right shoulder: Secondary | ICD-10-CM

## 2015-11-15 DIAGNOSIS — I10 Essential (primary) hypertension: Secondary | ICD-10-CM

## 2015-11-15 DIAGNOSIS — E669 Obesity, unspecified: Secondary | ICD-10-CM

## 2015-11-15 DIAGNOSIS — Z23 Encounter for immunization: Secondary | ICD-10-CM

## 2015-11-15 DIAGNOSIS — E785 Hyperlipidemia, unspecified: Secondary | ICD-10-CM

## 2015-11-15 MED ORDER — METOPROLOL SUCCINATE ER 25 MG PO TB24: 25.0000 mg | ORAL_TABLET | Freq: Every day | ORAL | Status: DC

## 2015-11-15 NOTE — Patient Instructions (Addendum)
After surgery start Krill oil, Curcumen caps and regular exercise 2 days after surgery [email or web address]  Hypertension Hypertension, commonly called high blood pressure, is when the force of blood pumping through your arteries is too strong. Your arteries are the blood vessels that carry blood from your heart throughout your body. A blood pressure reading consists of a higher number over a lower number, such as 110/72. The higher number (systolic) is the pressure inside your arteries when your heart pumps. The lower number (diastolic) is the pressure inside your arteries when your heart relaxes. Ideally you want your blood pressure below 120/80. Hypertension forces your heart to work harder to pump blood. Your arteries may become narrow or stiff. Having untreated or uncontrolled hypertension can cause heart attack, stroke, kidney disease, and other problems. RISK FACTORS Some risk factors for high blood pressure are controllable. Others are not.  Risk factors you cannot control include:   Race. You may be at higher risk if you are African American.  Age. Risk increases with age.  Gender. Men are at higher risk than women before age 17 years. After age 25, women are at higher risk than men. Risk factors you can control include:  Not getting enough exercise or physical activity.  Being overweight.  Getting too much fat, sugar, calories, or salt in your diet.  Drinking too much alcohol. SIGNS AND SYMPTOMS Hypertension does not usually cause signs or symptoms. Extremely high blood pressure (hypertensive crisis) may cause headache, anxiety, shortness of breath, and nosebleed. DIAGNOSIS To check if you have hypertension, your health care provider will measure your blood pressure while you are seated, with your arm held at the level of your heart. It should be measured at least twice using the same arm. Certain conditions can cause a difference in blood pressure between your right  and left arms. A blood pressure reading that is higher than normal on one occasion does not mean that you need treatment. If it is not clear whether you have high blood pressure, you may be asked to return on a different day to have your blood pressure checked again. Or, you may be asked to monitor your blood pressure at home for 1 or more weeks. TREATMENT Treating high blood pressure includes making lifestyle changes and possibly taking medicine. Living a healthy lifestyle can help lower high blood pressure. You may need to change some of your habits. Lifestyle changes may include:  Following the DASH diet. This diet is high in fruits, vegetables, and whole grains. It is low in salt, red meat, and added sugars.  Keep your sodium intake below 2,300 mg per day.  Getting at least 30-45 minutes of aerobic exercise at least 4 times per week.  Losing weight if necessary.  Not smoking.  Limiting alcoholic beverages.  Learning ways to reduce stress. Your health care provider may prescribe medicine if lifestyle changes are not enough to get your blood pressure under control, and if one of the following is true:  You are 54-39 years of age and your systolic blood pressure is above 140.  You are 90 years of age or older, and your systolic blood pressure is above 150.  Your diastolic blood pressure is above 90.  You have diabetes, and your systolic blood pressure is over [id number] or your diastolic blood pressure is over 90.  You have kidney disease and your blood pressure is above 140/90.  You have heart disease and your blood pressure is above 140/90. Your  personal target blood pressure may vary depending on your medical conditions, your age, and other factors. HOME CARE INSTRUCTIONS  Have your blood pressure rechecked as directed by your health care provider.   Take medicines only as directed by your health care provider. Follow the directions carefully. Blood pressure medicines must be taken  as prescribed. The medicine does not work as well when you skip doses. Skipping doses also puts you at risk for problems.  Do not smoke.   Monitor your blood pressure at home as directed by your health care provider. SEEK MEDICAL CARE IF:   You think you are having a reaction to medicines taken.  You have recurrent headaches or feel dizzy.  You have swelling in your ankles.  You have trouble with your vision. SEEK IMMEDIATE MEDICAL CARE IF:  You develop a severe headache or confusion.  You have unusual weakness, numbness, or feel faint.  You have severe chest or abdominal pain.  You vomit repeatedly.  You have trouble breathing. MAKE SURE YOU:   Understand these instructions.  Will watch your condition.  Will get help right away if you are not doing well or get worse.   This information is not intended to replace advice given to you by your health care provider. Make sure you discuss any questions you have with your health care provider.   Document Released: 05/14/2005 Document Revised: 09/28/2014 Document Reviewed: 03/06/2013 Elsevier Interactive Patient Education [employer].

## 2015-11-15 NOTE — Progress Notes (Signed)
Pre visit review using our clinic review tool, if applicable. No additional management support is needed unless otherwise documented below in the visit note. 

## 2015-11-16 LAB — CBC
HEMATOCRIT: 37.6 % (ref 36.0–46.0)
HEMOGLOBIN: 12.6 g/dL (ref 12.0–15.0)
MCHC: 33.4 g/dL (ref 30.0–36.0)
MCV: 87.9 fl (ref 78.0–100.0)
Platelets: 301 10*3/uL (ref 150.0–400.0)
RBC: 4.28 Mil/uL (ref 3.87–5.11)
RDW: 14 % (ref 11.5–15.5)
WBC: 8 10*3/uL (ref 4.0–10.5)

## 2015-11-16 LAB — COMPREHENSIVE METABOLIC PANEL
ALK PHOS: 47 U/L (ref 39–117)
ALT: 18 U/L (ref 0–35)
AST: 17 U/L (ref 0–37)
Albumin: 4 g/dL (ref 3.5–5.2)
BUN: 17 mg/dL (ref 6–23)
CHLORIDE: 101 meq/L (ref 96–112)
CO2: 30 meq/L (ref 19–32)
Calcium: 9.3 mg/dL (ref 8.4–10.5)
Creatinine, Ser: 0.62 mg/dL (ref 0.40–1.20)
GFR: 110.83 mL/min (ref >60.00)
GLUCOSE: 97 mg/dL (ref 70–99)
POTASSIUM: 4 meq/L (ref 3.5–5.1)
Sodium: 138 mEq/L (ref 135–145)
Total Bilirubin: 0.3 mg/dL (ref 0.2–1.2)
Total Protein: 7 g/dL (ref 6.0–8.3)

## 2015-11-16 LAB — VITAMIN B12: Vitamin B-12: 301 pg/mL (ref 211–911)

## 2015-11-16 LAB — VITAMIN D 25 HYDROXY (VIT D DEFICIENCY, FRACTURES): VITD: 28.39 ng/mL — ABNORMAL LOW (ref 30.00–100.00)

## 2015-11-17 ENCOUNTER — Ambulatory Visit (INDEPENDENT_AMBULATORY_CARE_PROVIDER_SITE_OTHER): Payer: Federal, State, Local not specified - PPO | Admitting: Podiatry

## 2015-11-17 ENCOUNTER — Ambulatory Visit (INDEPENDENT_AMBULATORY_CARE_PROVIDER_SITE_OTHER): Payer: Federal, State, Local not specified - PPO

## 2015-11-17 ENCOUNTER — Encounter: Payer: Self-pay | Admitting: Podiatry

## 2015-11-17 VITALS — BP 132/85 | HR 78 | Resp 16

## 2015-11-17 DIAGNOSIS — M6740 Ganglion, unspecified site: Secondary | ICD-10-CM

## 2015-11-17 NOTE — Progress Notes (Signed)
   Subjective:    Patient ID: Julie Bishop, female    DOB: 1970-10-30, 45 y.o.   MRN: [id number]  HPI: She presents today with chief complaint of a painful nodule to the dorsolateral aspect of the right foot. She states it has been present for approximately 3 years. She states that recently noticed it getting larger and starting to lose sensation to the top part of my little toe. She denies any, to the foot. She states that she has not had this looked at other than a primary care doctor.    Review of Systems  All other systems reviewed and are negative.      Objective:   Physical Exam: Vital signs are stable alert oriented 3 pulses are palpable. No apparent distress. Neurologic system is intact her Semmes-Weinstein monofilament. Deep tendon reflexes are intact muscle strength is normal bilateral. Deep tendon reflexes are intact. Orthopedic evaluation and strength all joints distal to the ankle of a full range of motion without crepitation. Cutaneous evaluation demonstrates supple well-hydrated cutis she has a nodular mass to the dorsal lateral aspect overlying the fifth metatarsal metadiaphyseal region of the right foot. This is a nonpulsatile mass that is firm on palpation. There are thin parts to the skin which appears to lean toward more of a ganglion-type cyst. Radiographically does not demonstrate any type of osseus abnormality beneath the lesion and there is no signs of calcification.        Assessment & Plan:  Ganglion cyst dorsal aspect right foot.  Plan: After localizing the area today we performed an aspiration removing a proximally 5 mL of a clear gelatinous fluid indicative of a ganglion cyst. I then placed 1/2 mL of Kenalog back into the cyst to help with inflammation and placed a compressive dressing around the foot. I explained to her that more than likely this would recur and that the only way to currently get rid of it would be to excise it. She understands that is  amenable to follow up with me as needed.

## 2015-11-27 ENCOUNTER — Encounter: Payer: Self-pay | Admitting: Family Medicine

## 2015-11-27 DIAGNOSIS — M25519 Pain in unspecified shoulder: Secondary | ICD-10-CM

## 2015-11-27 HISTORY — DX: Pain in unspecified shoulder: M25.519

## 2015-11-27 NOTE — Assessment & Plan Note (Signed)
Has surgery Tuesday with Dr Onnie Graham to repair rotator cuff.

## 2015-11-27 NOTE — Assessment & Plan Note (Signed)
Encouraged heart healthy diet, increase exercise, avoid trans fats, consider a krill oil cap daily 

## 2015-11-27 NOTE — Progress Notes (Signed)
Patient ID: GENELDA GIAMBRA, female   DOB: [birth year], 45 y.o.   MRN: [id number]   Subjective:    Patient ID: ZISSY EAMES, female    DOB: Mar 18, 1971, 45 y.o.   MRN: [id number]  Chief Complaint  Patient presents with  . Follow-up    HPI Patient is in today for follow up. She is noting her blood pressure running high. In the 140s over 90s routinely. No concnerin gsympotms. She is hoping to have surgery this week to repair an injured rotator cuff which is causing pain an ddysfunction. Denies CP/palp/SOB/HA/congestion/fevers/GI or GU c/o. Taking meds as prescribed  Past Medical History  Diagnosis Date  . Depression   . Anxiety   . GERD (gastroesophageal reflux disease)   . Hyperlipidemia     Elevated, but never taken medication  . Hypertension   . Cancer ([place]) 2008    skin cancer, BCC  . Constipation 11/14/2014  . Obesity 11/14/2014  . Generalized anxiety disorder   . Vitamin D deficiency 08/23/2015  . Vitamin B12 deficiency 08/23/2015  . Preventative health care 08/23/2015  . Post-menopause 08/23/2015  . Insomnia 08/24/2015  . Headache 08/24/2015  . Pain in joint, shoulder region 11/27/2015    Past Surgical History  Procedure Laterality Date  . Abdominal hysterectomy  2010  . Ectopic pregnancy surgery    . Breast biopsy      right, 1996  . Tubal ligation      on right    Family History  Problem Relation Age of Onset  . Heart disease Mother   . Diabetes Mother   . Depression Mother   . Diabetes Father   . Cancer Maternal Grandmother     throat  . Heart disease Maternal Grandmother   . Cancer Paternal Grandmother     lung  . Heart disease Paternal Grandmother   . Hypertension Sister     as a teenager    Social History   Social History  . Marital Status: Married    Spouse Name: N/A  . Number of Children: N/A  . Years of Education: 12   Occupational History  . claims representative    Social History Main Topics  . Smoking status: Never Smoker   .  Smokeless tobacco: Not on file  . Alcohol Use: No  . Drug Use: No  . Sexual Activity: Yes     Comment: works at [employer] off in [place], minimize carbs., lives with husband   Other Topics Concern  . Not on file   Social History Narrative    Outpatient Prescriptions Prior to Visit  Medication Sig Dispense Refill  . DULoxetine (CYMBALTA) 60 MG capsule Take 1 capsule by mouth daily.    . hydrochlorothiazide (HYDRODIURIL) 12.5 MG tablet Take 1 tablet (12.5 mg total) by mouth daily. 30 tablet 0  . LORazepam (ATIVAN) 0.5 MG tablet Take 0.5 mg by mouth daily.     . vitamin B-12 (CYANOCOBALAMIN) 250 MCG tablet Take 250 mcg by mouth daily. Reported on 11/15/2015    . Vitamin D, Ergocalciferol, (DRISDOL) 50000 units CAPS capsule Take 1 capsule (50,000 Units total) by mouth every 7 (seven) days. (Patient not taking: Reported on 11/15/2015) 4 capsule 4   No facility-administered medications prior to visit.    Allergies  Allergen Reactions  . Oxycodone Itching    Review of Systems  Constitutional: Negative for fever and malaise/fatigue.  HENT: Negative for congestion.   Eyes: Negative for blurred vision.  Respiratory: Negative for shortness  of breath.   Cardiovascular: Negative for chest pain, palpitations and leg swelling.  Gastrointestinal: Negative for nausea, abdominal pain and blood in stool.  Genitourinary: Negative for dysuria and frequency.  Musculoskeletal: Positive for joint pain. Negative for falls.  Skin: Negative for rash.  Neurological: Negative for dizziness, loss of consciousness and headaches.  Endo/Heme/Allergies: Negative for environmental allergies.  Psychiatric/Behavioral: Negative for depression. The patient is not nervous/anxious.        Objective:    Physical Exam  Constitutional: She is oriented to person, place, and time. She appears well-developed and well-nourished. No distress.  HENT:  Head: Normocephalic and atraumatic.  Nose: Nose normal.  Eyes:  Right eye exhibits no discharge. Left eye exhibits no discharge.  Neck: Normal range of motion. Neck supple.  Cardiovascular: Normal rate and regular rhythm.   No murmur heard. Pulmonary/Chest: Effort normal and breath sounds normal.  Abdominal: Soft. Bowel sounds are normal. There is no tenderness.  Musculoskeletal: She exhibits no edema.  Neurological: She is alert and oriented to person, place, and time.  Skin: Skin is warm and dry.  Psychiatric: She has a normal mood and affect.  Nursing note and vitals reviewed.   BP 132/90 mmHg  Pulse 89  Temp(Src) 98.1 F (36.7 C) (Oral)  Ht 5\' 3"  (1.6 m)  Wt 211 lb 10 oz (95.992 kg)  BMI 37.50 kg/m2  SpO2 97% Wt Readings from Last 3 Encounters:  11/15/15 211 lb 10 oz (95.992 kg)  11/03/15 211 lb (95.709 kg)  09/19/15 202 lb (91.627 kg)     Lab Results  Component Value Date   WBC 8.0 11/15/2015   HGB 12.6 11/15/2015   HCT 37.6 11/15/2015   PLT 301.0 11/15/2015   GLUCOSE 97 11/15/2015   CHOL 213* 11/11/2014   TRIG 67.0 11/11/2014   HDL 89.00 11/11/2014   LDLCALC 111* 11/11/2014   ALT 18 11/15/2015   AST 17 11/15/2015   NA 138 11/15/2015   K 4.0 11/15/2015   CL 101 11/15/2015   CREATININE 0.62 11/15/2015   BUN 17 11/15/2015   CO2 30 11/15/2015   TSH 2.67 11/11/2014   HGBA1C 5.2 08/23/2015    Lab Results  Component Value Date   TSH 2.67 11/11/2014   Lab Results  Component Value Date   WBC 8.0 11/15/2015   HGB 12.6 11/15/2015   HCT 37.6 11/15/2015   MCV 87.9 11/15/2015   PLT 301.0 11/15/2015   Lab Results  Component Value Date   NA 138 11/15/2015   K 4.0 11/15/2015   CO2 30 11/15/2015   GLUCOSE 97 11/15/2015   BUN 17 11/15/2015   CREATININE 0.62 11/15/2015   BILITOT 0.3 11/15/2015   ALKPHOS 47 11/15/2015   AST 17 11/15/2015   ALT 18 11/15/2015   PROT 7.0 11/15/2015   ALBUMIN 4.0 11/15/2015   CALCIUM 9.3 11/15/2015   GFR 110.83 11/15/2015   Lab Results  Component Value Date   CHOL 213* 11/11/2014    Lab Results  Component Value Date   HDL 89.00 11/11/2014   Lab Results  Component Value Date   LDLCALC 111* 11/11/2014   Lab Results  Component Value Date   TRIG 67.0 11/11/2014   Lab Results  Component Value Date   CHOLHDL 2 11/11/2014   Lab Results  Component Value Date   HGBA1C 5.2 08/23/2015       Assessment & Plan:   Problem List Items Addressed This Visit    Hyperlipidemia    Encouraged heart  healthy diet, increase exercise, avoid trans fats, consider a krill oil cap daily      Relevant Medications   metoprolol succinate (TOPROL-XL) 25 MG 24 hr tablet   Hypertension    Metoprolol Succinate 25 mg po daily Encouraged heart healthy diet such as the DASH diet and exercise as tolerated.       Relevant Medications   metoprolol succinate (TOPROL-XL) 25 MG 24 hr tablet   Obesity    Encouraged DASH diet, decrease po intake and increase exercise as tolerated. Needs 7-8 hours of sleep nightly. Avoid trans fats, eat small, frequent meals every 4-5 hours with lean proteins, complex carbs and healthy fats. Minimize simple carbs      Vitamin D deficiency   Relevant Orders   CBC (Completed)   Comprehensive metabolic panel (Completed)   Vitamin B12 (Completed)   Vitamin D (25 hydroxy) (Completed)   Vitamin B12 deficiency   Relevant Orders   CBC (Completed)   Comprehensive metabolic panel (Completed)   Vitamin B12 (Completed)   Vitamin D (25 hydroxy) (Completed)   Pain in joint, shoulder region    Has surgery Tuesday with Dr Onnie Graham to repair rotator cuff.        Other Visit Diagnoses    Benign essential HTN    -  Primary    Relevant Medications    metoprolol succinate (TOPROL-XL) 25 MG 24 hr tablet    Other Relevant Orders    CBC (Completed)    Comprehensive metabolic panel (Completed)    Vitamin B12 (Completed)    Vitamin D (25 hydroxy) (Completed)    Need for diphtheria-tetanus-pertussis (Tdap) vaccine, adult/adolescent        Relevant Orders    Tdap  vaccine greater than or equal to 7yo IM (Completed)       I am having Ms. Bula start on metoprolol succinate. I am also having her maintain her DULoxetine, LORazepam, Vitamin D (Ergocalciferol), vitamin B-12, and hydrochlorothiazide.  Meds ordered this encounter  Medications  . metoprolol succinate (TOPROL-XL) 25 MG 24 hr tablet    Sig: Take 1 tablet (25 mg total) by mouth daily.    Dispense:  30 tablet    Refill:  2     Penni Homans, MD

## 2015-11-27 NOTE — Assessment & Plan Note (Signed)
Encouraged DASH diet, decrease po intake and increase exercise as tolerated. Needs 7-8 hours of sleep nightly. Avoid trans fats, eat small, frequent meals every 4-5 hours with lean proteins, complex carbs and healthy fats. Minimize simple carbs 

## 2015-11-27 NOTE — Assessment & Plan Note (Addendum)
Metoprolol Succinate 25 mg po daily Encouraged heart healthy diet such as the DASH diet and exercise as tolerated.

## 2015-11-29 ENCOUNTER — Other Ambulatory Visit: Payer: Self-pay | Admitting: Medical

## 2015-12-13 ENCOUNTER — Telehealth: Payer: Self-pay | Admitting: Family Medicine

## 2015-12-13 MED ORDER — VITAMIN D (ERGOCALCIFEROL) 1.25 MG (50000 UNIT) PO CAPS: 50000.0000 [IU] | ORAL_CAPSULE | ORAL | Status: DC

## 2015-12-13 NOTE — Telephone Encounter (Signed)
Called pt, she wanted clarification of how much OTC vitamin D she should be taking. When level first checked in March 2017, she was advised to take 2000 units daily, then advised to increase by 1000 units after most recent labs. Advised pt this would be a total of 3000 units daily and she verbalized understanding. New rx for prescription strength vitamin D sent to pharmacy. She was initially instructed to do 12 weeks of rx strength, and then another 12 weeks as levels were still low. Initial rx sent for #4 w/ 4 RF, which is technically a 20 week supply. I filled rx for #4 w/ 0 RF to make a total of 24 weeks of therapy as indicated.

## 2015-12-13 NOTE — Telephone Encounter (Signed)
Relation to [id number] Call back number:[phone]   Reason for call:  patient in need of clarification regarding direction for her Vitamin D, Ergocalciferol, (DRISDOL) 50000 units CAPS capsule

## 2015-12-27 ENCOUNTER — Ambulatory Visit: Payer: Federal, State, Local not specified - PPO | Admitting: Family Medicine

## 2015-12-29 ENCOUNTER — Other Ambulatory Visit: Payer: Self-pay | Admitting: Medical

## 2016-01-10 ENCOUNTER — Other Ambulatory Visit: Payer: Self-pay | Admitting: Family Medicine

## 2016-01-27 ENCOUNTER — Ambulatory Visit: Payer: Federal, State, Local not specified - PPO | Admitting: Family Medicine

## 2016-01-27 ENCOUNTER — Encounter: Payer: Federal, State, Local not specified - PPO | Admitting: Family Medicine

## 2016-01-27 DIAGNOSIS — M25511 Pain in right shoulder: Secondary | ICD-10-CM | POA: Diagnosis not present

## 2016-01-27 DIAGNOSIS — Z0289 Encounter for other administrative examinations: Secondary | ICD-10-CM

## 2016-01-31 ENCOUNTER — Other Ambulatory Visit: Payer: Self-pay | Admitting: Medical

## 2016-01-31 MED ORDER — VITAMIN D (ERGOCALCIFEROL) 1.25 MG (50000 UNIT) PO CAPS: 50000.0000 [IU] | ORAL_CAPSULE | ORAL | 4 refills | Status: DC

## 2016-02-01 ENCOUNTER — Encounter: Payer: Self-pay | Admitting: Family Medicine

## 2016-02-01 DIAGNOSIS — M25511 Pain in right shoulder: Secondary | ICD-10-CM | POA: Diagnosis not present

## 2016-02-09 ENCOUNTER — Other Ambulatory Visit: Payer: Self-pay | Admitting: Family Medicine

## 2016-02-09 DIAGNOSIS — I10 Essential (primary) hypertension: Secondary | ICD-10-CM

## 2016-03-05 DIAGNOSIS — M25511 Pain in right shoulder: Secondary | ICD-10-CM | POA: Diagnosis not present

## 2016-03-08 ENCOUNTER — Other Ambulatory Visit: Payer: Self-pay | Admitting: Family Medicine

## 2016-03-19 ENCOUNTER — Telehealth: Payer: Self-pay | Admitting: Family Medicine

## 2016-03-19 ENCOUNTER — Ambulatory Visit (HOSPITAL_BASED_OUTPATIENT_CLINIC_OR_DEPARTMENT_OTHER)
Admission: RE | Admit: 2016-03-19 | Discharge: 2016-03-19 | Disposition: A | Discharge status: Home / Self Care | Payer: Federal, State, Local not specified - PPO | Source: Ambulatory Visit | Attending: Medical | Admitting: Medical

## 2016-03-19 ENCOUNTER — Ambulatory Visit (INDEPENDENT_AMBULATORY_CARE_PROVIDER_SITE_OTHER): Payer: Federal, State, Local not specified - PPO | Admitting: Medical

## 2016-03-19 ENCOUNTER — Encounter: Payer: Self-pay | Admitting: Medical

## 2016-03-19 VITALS — BP 143/92 | HR 98 | Temp 98.1°F | Ht 63.0 in | Wt 220.0 lb

## 2016-03-19 DIAGNOSIS — R269 Unspecified abnormalities of gait and mobility: Secondary | ICD-10-CM | POA: Diagnosis not present

## 2016-03-19 DIAGNOSIS — R519 Headache, unspecified: Secondary | ICD-10-CM

## 2016-03-19 DIAGNOSIS — J029 Acute pharyngitis, unspecified: Secondary | ICD-10-CM | POA: Diagnosis not present

## 2016-03-19 DIAGNOSIS — R631 Polydipsia: Secondary | ICD-10-CM

## 2016-03-19 DIAGNOSIS — M791 Myalgia, unspecified site: Secondary | ICD-10-CM

## 2016-03-19 DIAGNOSIS — IMO0001 Reserved for inherently not codable concepts without codable children: Secondary | ICD-10-CM

## 2016-03-19 DIAGNOSIS — R51 Headache: Secondary | ICD-10-CM | POA: Diagnosis not present

## 2016-03-19 DIAGNOSIS — I10 Essential (primary) hypertension: Secondary | ICD-10-CM

## 2016-03-19 DIAGNOSIS — R638 Other symptoms and signs concerning food and fluid intake: Secondary | ICD-10-CM | POA: Diagnosis not present

## 2016-03-19 LAB — GLUCOSE, POCT (MANUAL RESULT ENTRY): POC Glucose: 85 mg/dl (ref 70–99)

## 2016-03-19 MED ORDER — AMOXICILLIN 875 MG PO TABS: 875.0000 mg | ORAL_TABLET | Freq: Two times a day (BID) | ORAL | 0 refills | Status: DC

## 2016-03-19 MED ORDER — LOSARTAN POTASSIUM 50 MG PO TABS: 50.0000 mg | ORAL_TABLET | Freq: Every day | ORAL | 3 refills | Status: DC

## 2016-03-19 NOTE — Telephone Encounter (Signed)
Will you results pt rapid strep. Her flu test and her bs results.

## 2016-03-19 NOTE — Telephone Encounter (Signed)
I canceled orders so could close chart. But woud you reorder those flu, strep and fsbs if needed. I can signs and please results. Thanks.

## 2016-03-19 NOTE — Telephone Encounter (Signed)
Patient called stating that she is experiencing a severe headache since Saturday and her BP is elevated @ 140/100 this morning. Transferred to [facility]. Spoke with Larene Beach

## 2016-03-19 NOTE — Patient Instructions (Addendum)
For your htn, will add losartan 50 mg 1 tab a day. Continue the hctz. Pt is also on betablocker but she is scared to increase dose of b-blocker.  For your ha which has decreased now  will try to schedule CT of the head stat since ha was 10/10 and woke you at night on Saturday night. If your ha is worsening again as before and after hours then ED evaluation. In that event would recommend main ED at [facility] since you would need mri in that event.  Your strep test was negative. Flu test was negative. Your sugar finger test was 85.  You had some st recently and strep test was negative. If st worsens or reoccurs then start amoxicillin antibiotic.  Would still eat low sugar diet even though your sugar was good today.   Keep checking bp daily. Want to see numbers less than 140/90 in about 5 days.  Follow up in 7 days or as needed

## 2016-03-19 NOTE — Progress Notes (Signed)
Subjective:    Patient ID: Julie Bishop, female    DOB: Mar 26, 1971, 46 y.o.   MRN: [id number]  HPI  Pt in with reporting her bp is high today. Pt states she got ha on Saturday. She states she felt like had pounding ha. She  did not check her bp during the night. Sunday morning had less intense ha when woke. Her ha was 140/90. Pt took excedrin on Saturday during the day and on sunday Sunday and helped some. Today did not take excedrin.   Pt has taken bp medication everyday. She did not check her bp except on Sunday.  Pt states every day last week had a ha and was responding to excedrin.  On Monday she did have some mild neck pain.  However pt reports on Saturday night very late  woke up abruptly from sleep  with severe HA. She clarifies that the ha that woke her up from sleep was 10/10 ha. And was worse HA she had in her life. Then ha tapered down on Sunday.   She also noted excessive thirst on Saturday night. Next day mild st and also noted some diffuse body aches on Saturday after yard sale.    Review of Systems  Constitutional: Positive for fatigue. Negative for chills and diaphoresis.  HENT: Positive for sore throat.   Respiratory: Negative for cough, chest tightness and shortness of breath.   Cardiovascular: Negative for chest pain and palpitations.  Endocrine: Positive for polyuria. Negative for polydipsia and polyphagia.  Musculoskeletal: Positive for myalgias. Negative for back pain, neck pain and neck stiffness.       Some on Saturday had diffuse muscle aches after yard sale.  Skin: Negative for pallor and wound.  Neurological: Positive for headaches.  Hematological: Negative for adenopathy. Does not bruise/bleed easily.  Psychiatric/Behavioral: Negative for behavioral problems and confusion.    Past Medical History:  Diagnosis Date  . Anxiety   . Cancer ([place]) 2008   skin cancer, BCC  . Constipation 11/14/2014  . Depression   . Generalized anxiety disorder     . GERD (gastroesophageal reflux disease)   . Headache 08/24/2015  . Hyperlipidemia    Elevated, but never taken medication  . Hypertension   . Insomnia 08/24/2015  . Obesity 11/14/2014  . Pain in joint, shoulder region 11/27/2015  . Post-menopause 08/23/2015  . Preventative health care 08/23/2015  . Vitamin B12 deficiency 08/23/2015  . Vitamin D deficiency 08/23/2015     Social History   Social History  . Marital status: Married    Spouse name: N/A  . Number of children: N/A  . Years of education: 54   Occupational History  . claims representative    Social History Main Topics  . Smoking status: Never Smoker  . Smokeless tobacco: Not on file  . Alcohol use No  . Drug use: No  . Sexual activity: Yes     Comment: works at [employer] off in [place], [employer] lives with husband   Other Topics Concern  . Not on file   Social History Narrative  . No narrative on file    Past Surgical History:  Procedure Laterality Date  . ABDOMINAL HYSTERECTOMY  2010  . BREAST BIOPSY     right, 1996  . ECTOPIC PREGNANCY SURGERY    . TUBAL LIGATION     on right    Family History  Problem Relation Age of Onset  . Heart disease Mother   . Diabetes Mother   .  Depression Mother   . Diabetes Father   . Cancer Maternal Grandmother     throat  . Heart disease Maternal Grandmother   . Cancer Paternal Grandmother     lung  . Heart disease Paternal Grandmother   . Hypertension Sister     as a teenager    Allergies  Allergen Reactions  . Oxycodone Itching    Current Outpatient Prescriptions on File Prior to Visit  Medication Sig Dispense Refill  . cholecalciferol (VITAMIN D) 1000 units tablet Take 3,000 Units by mouth daily.    . DULoxetine (CYMBALTA) 60 MG capsule Take 1 capsule by mouth daily.    . hydrochlorothiazide (HYDRODIURIL) 12.5 MG tablet TAKE ONE TABLET BY MOUTH DAILY 30 tablet 0  . LORazepam (ATIVAN) 0.5 MG tablet Take 0.5 mg by mouth daily.     . metoprolol  succinate (TOPROL-XL) 25 MG 24 hr tablet TAKE ONE TABLET BY MOUTH DAILY 30 tablet 1  . vitamin B-12 (CYANOCOBALAMIN) 250 MCG tablet Take 250 mcg by mouth daily. Reported on 11/15/2015    . Vitamin D, Ergocalciferol, (DRISDOL) 50000 units CAPS capsule Take 1 capsule (50,000 Units total) by mouth once a week. 4 capsule 4   No current facility-administered medications on file prior to visit.     BP (!) 150/94   Pulse 98   Temp 98.1 F (36.7 C) (Oral)   Ht 5\' 3"  (1.6 m)   Wt 220 lb (99.8 kg)   SpO2 98%   BMI 38.97 kg/m       Objective:   Physical Exam  General Mental Status- Alert. General Appearance- Not in acute distress.   Skin General: Color- Normal Color. Moisture- Normal Moisture.  Neck Carotid Arteries- Normal color. Moisture- Normal Moisture. No carotid bruits. No JVD.  Chest and Lung Exam Auscultation: Breath Sounds:-Normal.  Cardiovascular Auscultation:Rythm- Regular. Murmurs & Other Heart Sounds:Auscultation of the heart reveals- No Murmurs.  Abdomen Inspection:-Inspeection Normal. Palpation/Percussion:Note:No mass. Palpation and Percussion of the abdomen reveal- Non Tender, Non Distended + BS, no rebound or guarding.    Neurologic Cranial Nerve exam:- CN III-XII intact(No nystagmus), symmetric smile. Drift Test:- No drift. Romberg Exam:- Negative.  Heal to Toe Gait exam:-mild difficulty.  Finger to Nose:- Normal/Intact Strength:- 5/5 equal and symmetric strength both upper and lower extremities.      Assessment & Plan:  For your htn, will add losartan 50 mg 1 tab a day. Continue the hctz. Pt is also on low dose betablocker but she is scared to increase dose of b-blocker.  For your ha which has decreased now  will try to schedule CT of the head stat since ha was 10/10 and woke you at night on Saturday night. If your ha is worsening again as before and after hours then ED evaluation. In that event would recommend main ED at [facility] since you would need mri  in that event.  Your strep test was negative. Flu test was negative. Your sugar finger test was 85.  You had some st recently and strep test was negative. If st worsens or reoccurs then start amoxicillin antibiotic. Would still eat low sugar diet even though your sugar was good today.  Keep checking bp daily. Want to see numbers less than 140/90 in about 5 days.  Follow up in 7 days or as needed   Kassity Woodson, Percell Miller, [employer]

## 2016-03-19 NOTE — Telephone Encounter (Signed)
Pt currently in office for appt w/ Mackie Pai, PA-C.

## 2016-03-19 NOTE — Telephone Encounter (Signed)
Patient Name: Julie Bishop  DOB: Nov 03, 1970    Initial Comment caller states she has elevated BP, diarrhea, stiff neck, scratchy throat, upset stomach/abd cramps and headache   Nurse Assessment  Nurse: Leilani Merl, RN, Heather Date/Time (Eastern Time): 03/19/2016 11:02:49 AM  Confirm and document reason for call. If symptomatic, describe symptoms. You must click the next button to save text entered. ---caller states she has elevated BP 135/104, diarrhea, stiff neck to shoulders, scratchy throat, upset stomach/abd cramps and headache. This started about a week ago  Has the patient traveled out of the country within the last 30 days? ---Not Applicable  Does the patient have any new or worsening symptoms? ---Yes  Will a triage be completed? ---Yes  Related visit to physician within the last 2 weeks? ---No  Does the PT have any chronic conditions? (i.e. diabetes, asthma, etc.) ---Yes  List chronic conditions. ---See MR  Is the patient pregnant or possibly pregnant? (Ask all females between the ages of 41-55) ---No  Is this a behavioral health or substance abuse call? ---No     Guidelines    Guideline Title Affirmed Question Affirmed Notes  High Blood Pressure BP ? 160/100    Final Disposition User   See PCP When Office is Open (within 3 days) Stand[place], RN, Water quality scientist    Comments  Appt today with Percell Miller at 200.   Referrals  REFERRED TO PCP OFFICE   Disagree/Comply: Comply

## 2016-03-20 LAB — POC INFLUENZA A&B (BINAX/QUICKVUE)
INFLUENZA B, POC: NEGATIVE
Influenza A, POC: NEGATIVE

## 2016-03-20 LAB — POCT RAPID STREP A (OFFICE): Rapid Strep A Screen: NEGATIVE

## 2016-03-20 NOTE — Addendum Note (Signed)
Addended by: Bunnie Domino on: 03/20/2016 08:25 AM   Modules accepted: Orders

## 2016-03-20 NOTE — Telephone Encounter (Signed)
I rewiewed both and were negative. Thanks for your help.

## 2016-03-20 NOTE — Addendum Note (Signed)
Addended by: Bunnie Domino on: 03/20/2016 08:21 AM   Modules accepted: Orders

## 2016-03-20 NOTE — Telephone Encounter (Signed)
I will put the orders back in,however I didn't see the results of the flu and strep you need to get with Barnet Pall on those left them with her.

## 2016-03-23 ENCOUNTER — Telehealth: Payer: Self-pay

## 2016-03-23 NOTE — Telephone Encounter (Signed)
I spoke with the patient and she states that she has had an upset stomach, excruciating pain and she has to run to the bathroom. She states that she has an appointment with Dr. Charlett Blake on Monday but she wanted to check with the provider since we are going into the weekend. Patient also wanted to let E. Saguier know that her bottom number of the blood pressure is not coming below 92. She says the top number is good.  Please advise.

## 2016-03-23 NOTE — Telephone Encounter (Signed)
Discussed with pt doubt side gi side effect losartan but stop temporarily(she read on line can cause GI symptoms). Increase hctz to 25 mg a day. But hydrate well with propel and eat bland foods. immodium otc.   Follow up on Monday as scheduled. If worse symptoms ED evaluation.  Note he bp readings did improve with losartan.

## 2016-03-26 ENCOUNTER — Ambulatory Visit (INDEPENDENT_AMBULATORY_CARE_PROVIDER_SITE_OTHER): Payer: Federal, State, Local not specified - PPO | Admitting: Family Medicine

## 2016-03-26 ENCOUNTER — Encounter: Payer: Self-pay | Admitting: Family Medicine

## 2016-03-26 VITALS — BP 136/7 | HR 78 | Temp 99.0°F | Ht 63.0 in | Wt 219.5 lb

## 2016-03-26 DIAGNOSIS — K219 Gastro-esophageal reflux disease without esophagitis: Secondary | ICD-10-CM

## 2016-03-26 DIAGNOSIS — G4710 Hypersomnia, unspecified: Secondary | ICD-10-CM

## 2016-03-26 DIAGNOSIS — Z0000 Encounter for general adult medical examination without abnormal findings: Secondary | ICD-10-CM

## 2016-03-26 DIAGNOSIS — G4730 Sleep apnea, unspecified: Secondary | ICD-10-CM

## 2016-03-26 DIAGNOSIS — E782 Mixed hyperlipidemia: Secondary | ICD-10-CM

## 2016-03-26 DIAGNOSIS — E559 Vitamin D deficiency, unspecified: Secondary | ICD-10-CM | POA: Diagnosis not present

## 2016-03-26 DIAGNOSIS — I10 Essential (primary) hypertension: Secondary | ICD-10-CM

## 2016-03-26 DIAGNOSIS — R51 Headache: Secondary | ICD-10-CM

## 2016-03-26 DIAGNOSIS — G479 Sleep disorder, unspecified: Secondary | ICD-10-CM

## 2016-03-26 DIAGNOSIS — R519 Headache, unspecified: Secondary | ICD-10-CM

## 2016-03-26 DIAGNOSIS — R0683 Snoring: Secondary | ICD-10-CM

## 2016-03-26 DIAGNOSIS — F411 Generalized anxiety disorder: Secondary | ICD-10-CM

## 2016-03-26 MED ORDER — HYDROCHLOROTHIAZIDE 25 MG PO TABS: 25.0000 mg | ORAL_TABLET | Freq: Every day | ORAL | 5 refills | Status: DC

## 2016-03-26 MED ORDER — METOPROLOL SUCCINATE ER 50 MG PO TB24: 50.0000 mg | ORAL_TABLET | Freq: Every day | ORAL | 5 refills | Status: DC

## 2016-03-26 NOTE — Assessment & Plan Note (Signed)
Encouraged heart healthy diet, increase exercise, avoid trans fats, consider a krill oil cap daily 

## 2016-03-26 NOTE — Assessment & Plan Note (Signed)
Avoid offending foods, start probiotics. Do not eat large meals in late evening and consider raising head of bed.  

## 2016-03-26 NOTE — Assessment & Plan Note (Signed)
Was having severe headache last week, was taking Metoprolol and HCTZ. Was given Losartan but had severe abdominal cramping. Stopped and then increased the HCTZ to 25 mg and now still on the Metoprolol XL 25 mg. Will increase to 50 mg if this causes Bradycia then may need to add Enalapril which she tolerated in past

## 2016-03-26 NOTE — Assessment & Plan Note (Signed)
Patient encouraged to maintain heart healthy diet, regular exercise, adequate sleep. Consider daily probiotics. Take medications as prescribed. Given and reviewed copy of ACP documents from [place] Secretary of State and encouraged to complete and return 

## 2016-03-26 NOTE — Progress Notes (Signed)
Pre visit review using our clinic review tool, if applicable. No additional management support is needed unless otherwise documented below in the visit note. 

## 2016-03-26 NOTE — Assessment & Plan Note (Addendum)
Takes daily vitamin D 3000 IU daily and 50000, IU q week, level wnl. Increase daily by 1000 and stop weekly

## 2016-03-26 NOTE — Patient Instructions (Signed)
Preventive Care for Adults, Female A healthy lifestyle and preventive care can promote health and wellness. Preventive health guidelines for women include the following key practices.  A routine yearly physical is a good way to check with your health care provider about your health and preventive screening. It is a chance to share any concerns and updates on your health and to receive a thorough exam.  Visit your dentist for a routine exam and preventive care every 6 months. Brush your teeth twice a day and floss once a day. Good oral hygiene prevents tooth decay and gum disease.  The frequency of eye exams is based on your age, health, family medical history, use of contact lenses, and other factors. Follow your health care provider's recommendations for frequency of eye exams.  Eat a healthy diet. Foods like vegetables, fruits, whole grains, low-fat dairy products, and lean protein foods contain the nutrients you need without too many calories. Decrease your intake of foods high in solid fats, added sugars, and salt. Eat the right amount of calories for you.Get information about a proper diet from your health care provider, if necessary.  Regular physical exercise is one of the most important things you can do for your health. Most adults should get at least 150 minutes of moderate-intensity exercise (any activity that increases your heart rate and causes you to sweat) each week. In addition, most adults need muscle-strengthening exercises on 2 or more days a week.  Maintain a healthy weight. The body mass index (BMI) is a screening tool to identify possible weight problems. It provides an estimate of body fat based on height and weight. Your health care provider can find your BMI and can help you achieve or maintain a healthy weight.For adults 20 years and older:  A BMI below 18.5 is considered underweight.  A BMI of 18.5 to 24.9 is normal.  A BMI of 25 to 29.9 is considered overweight.  A  BMI of 30 and above is considered obese.  Maintain normal blood lipids and cholesterol levels by exercising and minimizing your intake of saturated fat. Eat a balanced diet with plenty of fruit and vegetables. Blood tests for lipids and cholesterol should begin at age 20 and be repeated every 5 years. If your lipid or cholesterol levels are high, you are over 50, or you are at high risk for heart disease, you may need your cholesterol levels checked more frequently.Ongoing high lipid and cholesterol levels should be treated with medicines if diet and exercise are not working.  If you smoke, find out from your health care provider how to quit. If you do not use tobacco, do not start.  Lung cancer screening is recommended for adults aged 55-80 years who are at high risk for developing lung cancer because of a history of smoking. A yearly low-dose CT scan of the lungs is recommended for people who have at least a 30-pack-year history of smoking and are a current smoker or have quit within the past 15 years. A pack year of smoking is smoking an average of 1 pack of cigarettes a day for 1 year (for example: 1 pack a day for 30 years or 2 packs a day for 15 years). Yearly screening should continue until the smoker has stopped smoking for at least 15 years. Yearly screening should be stopped for people who develop a health problem that would prevent them from having lung cancer treatment.  If you are pregnant, do not drink alcohol. If you are   are breastfeeding, be very cautious about drinking alcohol. If you are not pregnant and choose to drink alcohol, do not have more than 1 drink per day. One drink is considered to be 12 ounces (355 mL) of beer, 5 ounces (148 mL) of wine, or 1.5 ounces (44 mL) of liquor.  Avoid use of street drugs. Do not share needles with anyone. Ask for help if you need support or instructions about stopping the use of drugs.  High blood pressure causes heart disease and  increases the risk of stroke. Your blood pressure should be checked at least every 1 to 2 years. Ongoing high blood pressure should be treated with medicines if weight loss and exercise do not work.  If you are 12-58 years old, ask your health care provider if you should take aspirin to prevent strokes.  Diabetes screening is done by taking a blood sample to check your blood glucose level after you have not eaten for a certain period of time (fasting). If you are not overweight and you do not have risk factors for diabetes, you should be screened once every 3 years starting at age 76. If you are overweight or obese and you are 18-1 years of age, you should be screened for diabetes every year as part of your cardiovascular risk assessment.  Breast cancer screening is essential preventive care for women. You should practice "breast self-awareness." This means understanding the normal appearance and feel of your breasts and may include breast self-examination. Any changes detected, no matter how small, should be reported to a health care provider. Women in their 73s and 30s should have a clinical breast exam (CBE) by a health care provider as part of a regular health exam every 1 to 3 years. After age 36, women should have a CBE every year. Starting at age 50, women should consider having a mammogram (breast X-ray test) every year. Women who have a family history of breast cancer should talk to their health care provider about genetic screening. Women at a high risk of breast cancer should talk to their health care providers about having an MRI and a mammogram every year.  Breast cancer gene (BRCA)-related cancer risk assessment is recommended for women who have family members with BRCA-related cancers. BRCA-related cancers include breast, ovarian, tubal, and peritoneal cancers. Having family members with these cancers may be associated with an increased risk for harmful changes (mutations) in the breast  cancer genes BRCA1 and BRCA2. Results of the assessment will determine the need for genetic counseling and BRCA1 and BRCA2 testing.  Your health care provider may recommend that you be screened regularly for cancer of the pelvic organs (ovaries, uterus, and vagina). This screening involves a pelvic examination, including checking for microscopic changes to the surface of your cervix (Pap test). You may be encouraged to have this screening done every 3 years, beginning at age 40.  For women ages 32-65, health care providers may recommend pelvic exams and Pap testing every 3 years, or they may recommend the Pap and pelvic exam, combined with testing for human papilloma virus (HPV), every 5 years. Some types of HPV increase your risk of cervical cancer. Testing for HPV may also be done on women of any age with unclear Pap test results.  Other health care providers may not recommend any screening for nonpregnant women who are considered low risk for pelvic cancer and who do not have symptoms. Ask your health care provider if a screening pelvic exam is right  for you.  If you have had past treatment for cervical cancer or a condition that could lead to cancer, you need Pap tests and screening for cancer for at least 20 years after your treatment. If Pap tests have been discontinued, your risk factors (such as having a new sexual partner) need to be reassessed to determine if screening should resume. Some women have medical problems that increase the chance of getting cervical cancer. In these cases, your health care provider may recommend more frequent screening and Pap tests.  Colorectal cancer can be detected and often prevented. Most routine colorectal cancer screening begins at the age of 31 years and continues through age 43 years. However, your health care provider may recommend screening at an earlier age if you have risk factors for colon cancer. On a yearly basis, your health care provider may provide  home test kits to check for hidden blood in the stool. Use of a small camera at the end of a tube, to directly examine the colon (sigmoidoscopy or colonoscopy), can detect the earliest forms of colorectal cancer. Talk to your health care provider about this at age 26, when routine screening begins. Direct exam of the colon should be repeated every 5-10 years through age 74 years, unless early forms of precancerous polyps or small growths are found.  People who are at an increased risk for hepatitis B should be screened for this virus. You are considered at high risk for hepatitis B if:  You were born in a country where hepatitis B occurs often. Talk with your health care provider about which countries are considered high risk.  Your parents were born in a high-risk country and you have not received a shot to protect against hepatitis B (hepatitis B vaccine).  You have HIV or AIDS.  You use needles to inject street drugs.  You live with, or have sex with, someone who has hepatitis B.  You get hemodialysis treatment.  You take certain medicines for conditions like cancer, organ transplantation, and autoimmune conditions.  Hepatitis C blood testing is recommended for all people born from [birth year] through 1965 and any individual with known risks for hepatitis C.  Practice safe sex. Use condoms and avoid high-risk sexual practices to reduce the spread of sexually transmitted infections (STIs). STIs include gonorrhea, chlamydia, syphilis, trichomonas, herpes, HPV, and human immunodeficiency virus (HIV). Herpes, HIV, and HPV are viral illnesses that have no cure. They can result in disability, cancer, and death.  You should be screened for sexually transmitted illnesses (STIs) including gonorrhea and chlamydia if:  You are sexually active and are younger than 24 years.  You are older than 24 years and your health care provider tells you that you are at risk for this type of infection.  Your sexual  activity has changed since you were last screened and you are at an increased risk for chlamydia or gonorrhea. Ask your health care provider if you are at risk.  If you are at risk of being infected with HIV, it is recommended that you take a prescription medicine daily to prevent HIV infection. This is called preexposure prophylaxis (PrEP). You are considered at risk if:  You are sexually active and do not regularly use condoms or know the HIV status of your partner(s).  You take drugs by injection.  You are sexually active with a partner who has HIV.  Talk with your health care provider about whether you are at high risk of being infected with HIV.  If you choose to begin PrEP, you should first be tested for HIV. You should then be tested every 3 months for as long as you are taking PrEP.  Osteoporosis is a disease in which the bones lose minerals and strength with aging. This can result in serious bone fractures or breaks. The risk of osteoporosis can be identified using a bone density scan. Women ages 5 years and over and women at risk for fractures or osteoporosis should discuss screening with their health care providers. Ask your health care provider whether you should take a calcium supplement or vitamin D to reduce the rate of osteoporosis.  Menopause can be associated with physical symptoms and risks. Hormone replacement therapy is available to decrease symptoms and risks. You should talk to your health care provider about whether hormone replacement therapy is right for you.  Use sunscreen. Apply sunscreen liberally and repeatedly throughout the day. You should seek shade when your shadow is shorter than you. Protect yourself by wearing long sleeves, pants, a wide-brimmed hat, and sunglasses year round, whenever you are outdoors.  Once a month, do a whole body skin exam, using a mirror to look at the skin on your back. Tell your health care provider of new moles, moles that have irregular  borders, moles that are larger than a pencil eraser, or moles that have changed in shape or color.  Stay current with required vaccines (immunizations).  Influenza vaccine. All adults should be immunized every year.  Tetanus, diphtheria, and acellular pertussis (Td, Tdap) vaccine. Pregnant women should receive 1 dose of Tdap vaccine during each pregnancy. The dose should be obtained regardless of the length of time since the last dose. Immunization is preferred during the 27th-36th week of gestation. An adult who has not previously received Tdap or who does not know her vaccine status should receive 1 dose of Tdap. This initial dose should be followed by tetanus and diphtheria toxoids (Td) booster doses every 10 years. Adults with an unknown or incomplete history of completing a 3-dose immunization series with Td-containing vaccines should begin or complete a primary immunization series including a Tdap dose. Adults should receive a Td booster every 10 years.  Varicella vaccine. An adult without evidence of immunity to varicella should receive 2 doses or a second dose if she has previously received 1 dose. Pregnant females who do not have evidence of immunity should receive the first dose after pregnancy. This first dose should be obtained before leaving the health care facility. The second dose should be obtained 4-8 weeks after the first dose.  Human papillomavirus (HPV) vaccine. Females aged 13-26 years who have not received the vaccine previously should obtain the 3-dose series. The vaccine is not recommended for use in pregnant females. However, pregnancy testing is not needed before receiving a dose. If a female is found to be pregnant after receiving a dose, no treatment is needed. In that case, the remaining doses should be delayed until after the pregnancy. Immunization is recommended for any person with an immunocompromised condition through the age of 46 years if she did not get any or all doses  earlier. During the 3-dose series, the second dose should be obtained 4-8 weeks after the first dose. The third dose should be obtained 24 weeks after the first dose and 16 weeks after the second dose.  Zoster vaccine. One dose is recommended for adults aged 30 years or older unless certain conditions are present.  Measles, mumps, and rubella (MMR) vaccine. Adults  born before [birth year] generally are considered immune to measles and mumps. Adults born in [birth year] or later should have 1 or more doses of MMR vaccine unless there is a contraindication to the vaccine or there is laboratory evidence of immunity to each of the three diseases. A routine second dose of MMR vaccine should be obtained at least 28 days after the first dose for students attending postsecondary schools, health care workers, or international travelers. People who received inactivated measles vaccine or an unknown type of measles vaccine during 1963-1967 should receive 2 doses of MMR vaccine. People who received inactivated mumps vaccine or an unknown type of mumps vaccine before 1979 and are at high risk for mumps infection should consider immunization with 2 doses of MMR vaccine. For females of childbearing age, rubella immunity should be determined. If there is no evidence of immunity, females who are not pregnant should be vaccinated. If there is no evidence of immunity, females who are pregnant should delay immunization until after pregnancy. Unvaccinated health care workers born before [birth year] who lack laboratory evidence of measles, mumps, or rubella immunity or laboratory confirmation of disease should consider measles and mumps immunization with 2 doses of MMR vaccine or rubella immunization with 1 dose of MMR vaccine.  Pneumococcal 13-valent conjugate (PCV13) vaccine. When indicated, a person who is uncertain of his immunization history and has no record of immunization should receive the PCV13 vaccine. All adults 62 years of age and older  should receive this vaccine. An adult aged 39 years or older who has certain medical conditions and has not been previously immunized should receive 1 dose of PCV13 vaccine. This PCV13 should be followed with a dose of pneumococcal polysaccharide (PPSV23) vaccine. Adults who are at high risk for pneumococcal disease should obtain the PPSV23 vaccine at least 8 weeks after the dose of PCV13 vaccine. Adults older than 45 years of age who have normal immune system function should obtain the PPSV23 vaccine dose at least 1 year after the dose of PCV13 vaccine.  Pneumococcal polysaccharide (PPSV23) vaccine. When PCV13 is also indicated, PCV13 should be obtained first. All adults aged 47 years and older should be immunized. An adult younger than age 27 years who has certain medical conditions should be immunized. Any person who resides in a nursing home or long-term care facility should be immunized. An adult smoker should be immunized. People with an immunocompromised condition and certain other conditions should receive both PCV13 and PPSV23 vaccines. People with human immunodeficiency virus (HIV) infection should be immunized as soon as possible after diagnosis. Immunization during chemotherapy or radiation therapy should be avoided. Routine use of PPSV23 vaccine is not recommended for American Indians, [place] Natives, or people younger than 65 years unless there are medical conditions that require PPSV23 vaccine. When indicated, people who have unknown immunization and have no record of immunization should receive PPSV23 vaccine. One-time revaccination 5 years after the first dose of PPSV23 is recommended for people aged 19-64 years who have chronic kidney failure, nephrotic syndrome, asplenia, or immunocompromised conditions. People who received 1-2 doses of PPSV23 before age 57 years should receive another dose of PPSV23 vaccine at age 42 years or later if at least 5 years have passed since the previous dose. Doses  of PPSV23 are not needed for people immunized with PPSV23 at or after age 51 years.  Meningococcal vaccine. Adults with asplenia or persistent complement component deficiencies should receive 2 doses of quadrivalent meningococcal conjugate (MenACWY-D) vaccine. The doses should be  obtained at least 2 months apart. Microbiologists working with certain meningococcal bacteria, [employer] recruits, people at risk during an outbreak, and people who travel to or live in countries with a high rate of meningitis should be immunized. A first-year college student up through age 29 years who is living in a residence hall should receive a dose if she did not receive a dose on or after her 16th birthday. Adults who have certain high-risk conditions should receive one or more doses of vaccine.  Hepatitis A vaccine. Adults who wish to be protected from this disease, have certain high-risk conditions, work with hepatitis A-infected animals, work in hepatitis A research labs, or travel to or work in countries with a high rate of hepatitis A should be immunized. Adults who were previously unvaccinated and who anticipate close contact with an international adoptee during the first 60 days after arrival in the [place] from a country with a high rate of hepatitis A should be immunized.  Hepatitis B vaccine. Adults who wish to be protected from this disease, have certain high-risk conditions, may be exposed to blood or other infectious body fluids, are household contacts or sex partners of hepatitis B positive people, are clients or workers in certain care facilities, or travel to or work in countries with a high rate of hepatitis B should be immunized.  Haemophilus influenzae type b (Hib) vaccine. A previously unvaccinated person with asplenia or sickle cell disease or having a scheduled splenectomy should receive 1 dose of Hib vaccine. Regardless of previous immunization, a recipient of a hematopoietic stem cell transplant  should receive a 3-dose series 6-12 months after her successful transplant. Hib vaccine is not recommended for adults with HIV infection. Preventive Services / Frequency Ages 74 to 98 years  Blood pressure check.** / Every 3-5 years.  Lipid and cholesterol check.** / Every 5 years beginning at age 4.  Clinical breast exam.** / Every 3 years for women in their 71s and 70s.  BRCA-related cancer risk assessment.** / For women who have family members with a BRCA-related cancer (breast, ovarian, tubal, or peritoneal cancers).  Pap test.** / Every 2 years from ages 15 through 33. Every 3 years starting at age 52 through age 28 or 72 with a history of 3 consecutive normal Pap tests.  HPV screening.** / Every 3 years from ages 72 through ages 21 to 53 with a history of 3 consecutive normal Pap tests.  Hepatitis C blood test.** / For any individual with known risks for hepatitis C.  Skin self-exam. / Monthly.  Influenza vaccine. / Every year.  Tetanus, diphtheria, and acellular pertussis (Tdap, Td) vaccine.** / Consult your health care provider. Pregnant women should receive 1 dose of Tdap vaccine during each pregnancy. 1 dose of Td every 10 years.  Varicella vaccine.** / Consult your health care provider. Pregnant females who do not have evidence of immunity should receive the first dose after pregnancy.  HPV vaccine. / 3 doses over 6 months, if 11 and younger. The vaccine is not recommended for use in pregnant females. However, pregnancy testing is not needed before receiving a dose.  Measles, mumps, rubella (MMR) vaccine.** / You need at least 1 dose of MMR if you were born in 1957 or later. You may also need a 2nd dose. For females of childbearing age, rubella immunity should be determined. If there is no evidence of immunity, females who are not pregnant should be vaccinated. If there is no evidence of immunity, females who  pregnant should delay immunization until after pregnancy.  Pneumococcal  13-valent conjugate (PCV13) vaccine.** / Consult your health care provider.  Pneumococcal polysaccharide (PPSV23) vaccine.** / 1 to 2 doses if you smoke cigarettes or if you have certain conditions.  Meningococcal vaccine.** / 1 dose if you are age 19 to 21 years and a first-year college student living in a residence hall, or have one of several medical conditions, you need to get vaccinated against meningococcal disease. You may also need additional booster doses.  Hepatitis A vaccine.** / Consult your health care provider.  Hepatitis B vaccine.** / Consult your health care provider.  Haemophilus influenzae type b (Hib) vaccine.** / Consult your health care provider. Ages 40 to 64 years  Blood pressure check.** / Every year.  Lipid and cholesterol check.** / Every 5 years beginning at age 20 years.  Lung cancer screening. / Every year if you are aged 55-80 years and have a 30-pack-year history of smoking and currently smoke or have quit within the past 15 years. Yearly screening is stopped once you have quit smoking for at least 15 years or develop a health problem that would prevent you from having lung cancer treatment.  Clinical breast exam.** / Every year after age 40 years.  BRCA-related cancer risk assessment.** / For women who have family members with a BRCA-related cancer (breast, ovarian, tubal, or peritoneal cancers).  Mammogram.** / Every year beginning at age 40 years and continuing for as long as you are in good health. Consult with your health care provider.  Pap test.** / Every 3 years starting at age 30 years through age 65 or 70 years with a history of 3 consecutive normal Pap tests.  HPV screening.** / Every 3 years from ages 30 years through ages 65 to 70 years with a history of 3 consecutive normal Pap tests.  Fecal occult blood test (FOBT) of stool. / Every year beginning at age 50 years and continuing until age 75 years. You may not need to do this test if you get  a colonoscopy every 10 years.  Flexible sigmoidoscopy or colonoscopy.** / Every 5 years for a flexible sigmoidoscopy or every 10 years for a colonoscopy beginning at age 50 years and continuing until age 75 years.  Hepatitis C blood test.** / For all people born from 1945 through 1965 and any individual with known risks for hepatitis C.  Skin self-exam. / Monthly.  Influenza vaccine. / Every year.  Tetanus, diphtheria, and acellular pertussis (Tdap/Td) vaccine.** / Consult your health care provider. Pregnant women should receive 1 dose of Tdap vaccine during each pregnancy. 1 dose of Td every 10 years.  Varicella vaccine.** / Consult your health care provider. Pregnant females who do not have evidence of immunity should receive the first dose after pregnancy.  Zoster vaccine.** / 1 dose for adults aged 60 years or older.  Measles, mumps, rubella (MMR) vaccine.** / You need at least 1 dose of MMR if you were born in 1957 or later. You may also need a second dose. For females of childbearing age, rubella immunity should be determined. If there is no evidence of immunity, females who are not pregnant should be vaccinated. If there is no evidence of immunity, females who are pregnant should delay immunization until after pregnancy.  Pneumococcal 13-valent conjugate (PCV13) vaccine.** / Consult your health care provider.  Pneumococcal polysaccharide (PPSV23) vaccine.** / 1 to 2 doses if you smoke cigarettes or if you have certain conditions.  Meningococcal vaccine.** /   Consult your health care provider.  Hepatitis A vaccine.** / Consult your health care provider.  Hepatitis B vaccine.** / Consult your health care provider.  Haemophilus influenzae type b (Hib) vaccine.** / Consult your health care provider. Ages 80 years and over  Blood pressure check.** / Every year.  Lipid and cholesterol check.** / Every 5 years beginning at age 62 years.  Lung cancer  screening. / Every year if you are aged 32-80 years and have a 30-pack-year history of smoking and currently smoke or have quit within the past 15 years. Yearly screening is stopped once you have quit smoking for at least 15 years or develop a health problem that would prevent you from having lung cancer treatment.  Clinical breast exam.** / Every year after age 61 years.  BRCA-related cancer risk assessment.** / For women who have family members with a BRCA-related cancer (breast, ovarian, tubal, or peritoneal cancers).  Mammogram.** / Every year beginning at age 39 years and continuing for as long as you are in good health. Consult with your health care provider.  Pap test.** / Every 3 years starting at age 85 years through age 74 or 72 years with 3 consecutive normal Pap tests. Testing can be stopped between 65 and 70 years with 3 consecutive normal Pap tests and no abnormal Pap or HPV tests in the past 10 years.  HPV screening.** / Every 3 years from ages 55 years through ages 67 or 77 years with a history of 3 consecutive normal Pap tests. Testing can be stopped between 65 and 70 years with 3 consecutive normal Pap tests and no abnormal Pap or HPV tests in the past 10 years.  Fecal occult blood test (FOBT) of stool. / Every year beginning at age 81 years and continuing until age 22 years. You may not need to do this test if you get a colonoscopy every 10 years.  Flexible sigmoidoscopy or colonoscopy.** / Every 5 years for a flexible sigmoidoscopy or every 10 years for a colonoscopy beginning at age 67 years and continuing until age 22 years.  Hepatitis C blood test.** / For all people born from [birth year] through 1965 and any individual with known risks for hepatitis C.  Osteoporosis screening.** / A one-time screening for women ages 8 years and over and women at risk for fractures or osteoporosis.  Skin self-exam. / Monthly.  Influenza vaccine. / Every year.  Tetanus, diphtheria, and  acellular pertussis (Tdap/Td) vaccine.** / 1 dose of Td every 10 years.  Varicella vaccine.** / Consult your health care provider.  Zoster vaccine.** / 1 dose for adults aged 56 years or older.  Pneumococcal 13-valent conjugate (PCV13) vaccine.** / Consult your health care provider.  Pneumococcal polysaccharide (PPSV23) vaccine.** / 1 dose for all adults aged 15 years and older.  Meningococcal vaccine.** / Consult your health care provider.  Hepatitis A vaccine.** / Consult your health care provider.  Hepatitis B vaccine.** / Consult your health care provider.  Haemophilus influenzae type b (Hib) vaccine.** / Consult your health care provider. ** Family history and personal history of risk and conditions may change your health care provider's recommendations.   This information is not intended to replace advice given to you by your health care provider. Make sure you discuss any questions you have with your health care provider.   Document Released: 07/10/2001 Document Revised: 06/04/2014 Document Reviewed: 10/09/2010 Elsevier Interactive Patient Education [employer].

## 2016-03-27 ENCOUNTER — Telehealth: Payer: Self-pay | Admitting: Family Medicine

## 2016-03-27 LAB — TSH: TSH: 1.64 u[IU]/mL (ref 0.35–4.50)

## 2016-03-27 LAB — COMPREHENSIVE METABOLIC PANEL
ALK PHOS: 47 U/L (ref 39–117)
ALT: 18 U/L (ref 0–35)
AST: 14 U/L (ref 0–37)
Albumin: 4.1 g/dL (ref 3.5–5.2)
BUN: 16 mg/dL (ref 6–23)
CHLORIDE: 98 meq/L (ref 96–112)
CO2: 30 mEq/L (ref 19–32)
Calcium: 9.6 mg/dL (ref 8.4–10.5)
Creatinine, Ser: 0.63 mg/dL (ref 0.40–1.20)
GFR: 108.63 mL/min (ref >60.00)
GLUCOSE: 76 mg/dL (ref 70–99)
POTASSIUM: 3.6 meq/L (ref 3.5–5.1)
SODIUM: 136 meq/L (ref 135–145)
Total Bilirubin: 0.3 mg/dL (ref 0.2–1.2)
Total Protein: 7.3 g/dL (ref 6.0–8.3)

## 2016-03-27 LAB — VITAMIN D 25 HYDROXY (VIT D DEFICIENCY, FRACTURES): VITD: 40.79 ng/mL (ref 30.00–100.00)

## 2016-03-27 LAB — CBC
HEMATOCRIT: 35.5 % — AB (ref 36.0–46.0)
HEMOGLOBIN: 12.2 g/dL (ref 12.0–15.0)
MCHC: 34.3 g/dL (ref 30.0–36.0)
MCV: 85.1 fl (ref 78.0–100.0)
PLATELETS: 321 10*3/uL (ref 150.0–400.0)
RBC: 4.17 Mil/uL (ref 3.87–5.11)
RDW: 14.1 % (ref 11.5–15.5)
WBC: 8.8 10*3/uL (ref 4.0–10.5)

## 2016-03-27 LAB — LIPID PANEL
Cholesterol: 205 mg/dL — ABNORMAL HIGH (ref 0–200)
HDL: 74.1 mg/dL (ref >39.00)
LDL CALC: 100 mg/dL — AB (ref 0–99)
NONHDL: 130.99
Total CHOL/HDL Ratio: 3
Triglycerides: 156 mg/dL — ABNORMAL HIGH (ref 0.0–149.0)
VLDL: 31.2 mg/dL (ref 0.0–40.0)

## 2016-03-27 NOTE — Telephone Encounter (Signed)
The metoprolol can cause a fuzzy headed feeling but mostly when blood pressure is over treated and too low. Next time she feels bad again check her pressure and let us know what it is or come in if continues to feel bad

## 2016-03-27 NOTE — Telephone Encounter (Signed)
All morning patient felt like she was in a fog, but this afternoon she is doing better, much better.  She would like to know if this is normal. BP this am was 141/101.  Only has checked it once.

## 2016-03-27 NOTE — Telephone Encounter (Signed)
Patient informed of PCP instructions. 

## 2016-03-27 NOTE — Telephone Encounter (Signed)
Caller name: Relationship to patient: Self Can be reached: [phone] Pharmacy:  Reason for call: Patient request a call back to discuss a feeling she is getting from the increase in Metoprolol

## 2016-03-28 ENCOUNTER — Telehealth: Payer: Self-pay | Admitting: Behavioral Health

## 2016-03-28 NOTE — Telephone Encounter (Signed)
Patient reported feeling like she's in a fog after taking her blood pressure medication, Metoprolol. Also stated, that she "was last seen on this past Monday with PCP and the medication was doubled in dose and she's scheduled for a nurse visit in 3 weeks for a blood pressure check". Patient provided the following BP readings: currently her reading is 140/105, earlier this morning, 122/92 & heart rate in the 60's. Patient denies dizziness, unsteady gait, increased or pounding heart rate & excessive sweating, but continues to have a headache. There are no available appointments in office today, but did offer for the patient to be seen at one of the other locations. Patient declined and opted to make an appointment for tomorrow instead. Appointment has been scheduled with Mackie Pai, PA-C on 03/29/16 at 9:00 AM. Advised patient that if symptoms or condition worsens, call the office or seek the nearest Emergency Department. She verbalized understanding and did not have any further concerns prior to call ending.

## 2016-03-29 ENCOUNTER — Ambulatory Visit: Payer: Federal, State, Local not specified - PPO | Admitting: Medical

## 2016-03-29 ENCOUNTER — Telehealth: Payer: Self-pay | Admitting: Family Medicine

## 2016-03-29 NOTE — Telephone Encounter (Signed)
Pt called in at 8:28 to cxl her appt. She says that she is feeling better. She no longer need her appt.

## 2016-03-29 NOTE — Telephone Encounter (Signed)
Pt needs to educated on the no show policy. Please let her know will not charge this time. But let me know she is aware of policy. Also knowing how many no shows in the past.(I think I may be too lenient and also maybe not great memory) Help me out in future letting me now if pt has no showed in past before. Thanks.

## 2016-04-01 DIAGNOSIS — R519 Headache, unspecified: Secondary | ICD-10-CM | POA: Insufficient documentation

## 2016-04-01 DIAGNOSIS — R51 Headache: Secondary | ICD-10-CM

## 2016-04-01 DIAGNOSIS — R0683 Snoring: Secondary | ICD-10-CM | POA: Insufficient documentation

## 2016-04-01 NOTE — Assessment & Plan Note (Signed)
Encouraged increased hydration, 64 ounces of clear fluids daily. Minimize alcohol and caffeine. Eat small frequent meals with lean proteins and complex carbs. Avoid high and low blood sugars. Get adequate sleep, 7-8 hours a night. Needs exercise daily preferably in the morning. Check sleep study

## 2016-04-01 NOTE — Assessment & Plan Note (Signed)
Restless sleep, fatigue, witnessed apnea, obesity, headaches in am and obesity

## 2016-04-01 NOTE — Assessment & Plan Note (Signed)
Doing well on Cymbalta

## 2016-04-01 NOTE — Progress Notes (Signed)
Patient ID: Julie Bishop, female   DOB: 12-19-1970, 45 y.o.   MRN: [id number]   Subjective:    Patient ID: Julie Bishop, female    DOB: [birth year], 45 y.o.   MRN: [id number]  Chief Complaint  Patient presents with  . Annual Exam    HPI Patient is in today for annual preventative exam and follow up on numerous chronic health concerns. She has been struggling with elevated BP lately. Did not tolerate Losartan due to abdominal cramping. Endorses frequent headaches, restless sleep, snoring, family reports apneic episodes. Denies CP/palp/SOB/HA/congestion/fevers/GI or GU c/o. Taking meds as prescribed  Past Medical History:  Diagnosis Date  . Anxiety   . Cancer ([place]) 2008   skin cancer, BCC  . Constipation 11/14/2014  . Depression   . Generalized anxiety disorder   . GERD (gastroesophageal reflux disease)   . Headache 08/24/2015  . Hyperlipidemia    Elevated, but never taken medication  . Hypertension   . Insomnia 08/24/2015  . Obesity 11/14/2014  . Pain in joint, shoulder region 11/27/2015  . Post-menopause 08/23/2015  . Preventative health care 08/23/2015  . Vitamin B12 deficiency 08/23/2015  . Vitamin D deficiency 08/23/2015    Past Surgical History:  Procedure Laterality Date  . ABDOMINAL HYSTERECTOMY  2010  . BREAST BIOPSY     right, 1996  . ECTOPIC PREGNANCY SURGERY    . TUBAL LIGATION     on right    Family History  Problem Relation Age of Onset  . Heart disease Mother   . Diabetes Mother   . Depression Mother   . Diabetes Father   . Cancer Maternal Grandmother     throat  . Heart disease Maternal Grandmother   . Cancer Paternal Grandmother     lung  . Heart disease Paternal Grandmother   . Hypertension Sister     as a teenager    Social History   Social History  . Marital status: Married    Spouse name: N/A  . Number of children: N/A  . Years of education: 71   Occupational History  . claims representative    Social History Main Topics    . Smoking status: Never Smoker  . Smokeless tobacco: Not on file  . Alcohol use No  . Drug use: No  . Sexual activity: Yes     Comment: works at [employer] off in [place], [employer] lives with husband   Other Topics Concern  . Not on file   Social History Narrative  . No narrative on file    Outpatient Medications Prior to Visit  Medication Sig Dispense Refill  . cholecalciferol (VITAMIN D) 1000 units tablet Take 3,000 Units by mouth daily.    . DULoxetine (CYMBALTA) 60 MG capsule Take 1 capsule by mouth daily.    Marland Kitchen LORazepam (ATIVAN) 0.5 MG tablet Take 0.5 mg by mouth daily.     . vitamin B-12 (CYANOCOBALAMIN) 250 MCG tablet Take 250 mcg by mouth daily. Reported on 11/15/2015    . Vitamin D, Ergocalciferol, (DRISDOL) 50000 units CAPS capsule Take 1 capsule (50,000 Units total) by mouth once a week. 4 capsule 4  . hydrochlorothiazide (HYDRODIURIL) 12.5 MG tablet TAKE ONE TABLET BY MOUTH DAILY (Patient taking differently: No sig reported) 30 tablet 0  . metoprolol succinate (TOPROL-XL) 25 MG 24 hr tablet TAKE ONE TABLET BY MOUTH DAILY 30 tablet 1  . losartan (COZAAR) 50 MG tablet Take 1 tablet (50 mg total) by mouth daily. (Patient  not taking: Reported on 03/26/2016) 30 tablet 3  . amoxicillin (AMOXIL) 875 MG tablet Take 1 tablet (875 mg total) by mouth 2 (two) times daily. 20 tablet 0   No facility-administered medications prior to visit.     Allergies  Allergen Reactions  . Oxycodone Itching  . Losartan Other (See Comments)    Diarrhea and abodminal cramping    Review of Systems  Constitutional: Positive for malaise/fatigue. Negative for chills and fever.  HENT: Negative for congestion and hearing loss.   Eyes: Negative for discharge.  Respiratory: Negative for cough, sputum production and shortness of breath.   Cardiovascular: Negative for chest pain, palpitations and leg swelling.  Gastrointestinal: Positive for abdominal pain. Negative for blood in stool,  constipation, diarrhea, heartburn, nausea and vomiting.  Genitourinary: Negative for dysuria, frequency, hematuria and urgency.  Musculoskeletal: Negative for back pain, falls and myalgias.  Skin: Negative for rash.  Neurological: Positive for headaches. Negative for dizziness, sensory change, loss of consciousness and weakness.  Endo/Heme/Allergies: Negative for environmental allergies. Does not bruise/bleed easily.  Psychiatric/Behavioral: Negative for depression and suicidal ideas. The patient is not nervous/anxious and does not have insomnia.        Objective:    Physical Exam  Constitutional: She is oriented to person, place, and time. She appears well-developed and well-nourished. No distress.  HENT:  Head: Normocephalic and atraumatic.  Eyes: Conjunctivae are normal.  Neck: Neck supple. No thyromegaly present.  Cardiovascular: Normal rate, regular rhythm and normal heart sounds.   No murmur heard. Pulmonary/Chest: Effort normal and breath sounds normal. No respiratory distress.  Abdominal: Soft. Bowel sounds are normal. She exhibits no distension and no mass. There is no tenderness.  Musculoskeletal: She exhibits no edema.  Lymphadenopathy:    She has no cervical adenopathy.  Neurological: She is alert and oriented to person, place, and time.  Skin: Skin is warm and dry.  Psychiatric: She has a normal mood and affect. Her behavior is normal.    BP (!) 136/7 (BP Location: Left Arm, Patient Position: Sitting, Cuff Size: Large)   Pulse 78   Temp 99 F (37.2 C) (Oral)   Ht 5\' 3"  (1.6 m)   Wt 219 lb 8 oz (99.6 kg)   SpO2 99%   BMI 38.88 kg/m  Wt Readings from Last 3 Encounters:  03/26/16 219 lb 8 oz (99.6 kg)  03/19/16 220 lb (99.8 kg)  11/15/15 211 lb 10 oz (96 kg)     Lab Results  Component Value Date   WBC 8.8 03/26/2016   HGB 12.2 03/26/2016   HCT 35.5 (L) 03/26/2016   PLT 321.0 03/26/2016   GLUCOSE 76 03/26/2016   CHOL 205 (H) 03/26/2016   TRIG 156.0 (H)  03/26/2016   HDL 74.10 03/26/2016   LDLCALC 100 (H) 03/26/2016   ALT 18 03/26/2016   AST 14 03/26/2016   NA 136 03/26/2016   K 3.6 03/26/2016   CL 98 03/26/2016   CREATININE 0.63 03/26/2016   BUN 16 03/26/2016   CO2 30 03/26/2016   TSH 1.64 03/26/2016   HGBA1C 5.2 08/23/2015    Lab Results  Component Value Date   TSH 1.64 03/26/2016   Lab Results  Component Value Date   WBC 8.8 03/26/2016   HGB 12.2 03/26/2016   HCT 35.5 (L) 03/26/2016   MCV 85.1 03/26/2016   PLT 321.0 03/26/2016   Lab Results  Component Value Date   NA 136 03/26/2016   K 3.6 03/26/2016   CO2  30 03/26/2016   GLUCOSE 76 03/26/2016   BUN 16 03/26/2016   CREATININE 0.63 03/26/2016   BILITOT 0.3 03/26/2016   ALKPHOS 47 03/26/2016   AST 14 03/26/2016   ALT 18 03/26/2016   PROT 7.3 03/26/2016   ALBUMIN 4.1 03/26/2016   CALCIUM 9.6 03/26/2016   GFR 108.63 03/26/2016   Lab Results  Component Value Date   CHOL 205 (H) 03/26/2016   Lab Results  Component Value Date   HDL 74.10 03/26/2016   Lab Results  Component Value Date   LDLCALC 100 (H) 03/26/2016   Lab Results  Component Value Date   TRIG 156.0 (H) 03/26/2016   Lab Results  Component Value Date   CHOLHDL 3 03/26/2016   Lab Results  Component Value Date   HGBA1C 5.2 08/23/2015       Assessment & Plan:   Problem List Items Addressed This Visit    Generalized anxiety disorder    Doing well on Cymbalta      GERD (gastroesophageal reflux disease)    Avoid offending foods, start probiotics. Do not eat large meals in late evening and consider raising head of bed.       Hyperlipidemia    Encouraged heart healthy diet, increase exercise, avoid trans fats, consider a krill oil cap daily      Relevant Medications   metoprolol succinate (TOPROL-XL) 50 MG 24 hr tablet   hydrochlorothiazide (HYDRODIURIL) 25 MG tablet   Other Relevant Orders   Lipid panel (Completed)   Hypertension    Was having severe headache last week, was  taking Metoprolol and HCTZ. Was given Losartan but had severe abdominal cramping. Stopped and then increased the HCTZ to 25 mg and now still on the Metoprolol XL 25 mg. Will increase to 50 mg if this causes Bradycia then may need to add Enalapril which she tolerated in past      Relevant Medications   metoprolol succinate (TOPROL-XL) 50 MG 24 hr tablet   hydrochlorothiazide (HYDRODIURIL) 25 MG tablet   Other Relevant Orders   TSH (Completed)   CBC (Completed)   Comprehensive metabolic panel (Completed)   Ambulatory referral to Pulmonology   Vitamin D deficiency    Takes daily vitamin D 3000 IU daily and 50000, IU q week, level wnl. Increase daily by 1000 and stop weekly      Relevant Orders   Vitamin D (25 hydroxy) (Completed)   Preventative health care - Primary    Patient encouraged to maintain heart healthy diet, regular exercise, adequate sleep. Consider daily probiotics. Take medications as prescribed. Given and reviewed copy of ACP documents from [place] and encouraged to complete and return      Headache disorder    Encouraged increased hydration, 64 ounces of clear fluids daily. Minimize alcohol and caffeine. Eat small frequent meals with lean proteins and complex carbs. Avoid high and low blood sugars. Get adequate sleep, 7-8 hours a night. Needs exercise daily preferably in the morning. Check sleep study      Relevant Medications   metoprolol succinate (TOPROL-XL) 50 MG 24 hr tablet   Other Relevant Orders   Ambulatory referral to Pulmonology   Snoring    Restless sleep, fatigue, witnessed apnea, obesity, headaches in am and obesity      Relevant Orders   Ambulatory referral to Pulmonology    Other Visit Diagnoses    Morbid obesity [facility])       Relevant Orders   Ambulatory referral to Pulmonology  Hypersomnia with sleep apnea       Relevant Orders   Ambulatory referral to Pulmonology   Restless sleeper       Relevant Orders   Ambulatory referral to  Pulmonology      I have discontinued Ms. Haycraft's metoprolol succinate, hydrochlorothiazide, and amoxicillin. I am also having her start on metoprolol succinate and hydrochlorothiazide. Additionally, I am having her maintain her DULoxetine, LORazepam, vitamin B-12, cholecalciferol, Vitamin D (Ergocalciferol), and losartan.  Meds ordered this encounter  Medications  . metoprolol succinate (TOPROL-XL) 50 MG 24 hr tablet    Sig: Take 1 tablet (50 mg total) by mouth daily. Take with or immediately following a meal.    Dispense:  30 tablet    Refill:  5  . hydrochlorothiazide (HYDRODIURIL) 25 MG tablet    Sig: Take 1 tablet (25 mg total) by mouth daily.    Dispense:  30 tablet    Refill:  5     Penni Homans, MD

## 2016-04-09 ENCOUNTER — Other Ambulatory Visit: Payer: Self-pay | Admitting: Family Medicine

## 2016-04-17 ENCOUNTER — Ambulatory Visit (INDEPENDENT_AMBULATORY_CARE_PROVIDER_SITE_OTHER): Payer: Federal, State, Local not specified - PPO | Admitting: Family Medicine

## 2016-04-17 VITALS — BP 134/82 | HR 65

## 2016-04-17 DIAGNOSIS — I10 Essential (primary) hypertension: Secondary | ICD-10-CM | POA: Diagnosis not present

## 2016-04-17 NOTE — Progress Notes (Signed)
RN blood pressure check note reviewed. Agree with documention and plan. 

## 2016-04-17 NOTE — Progress Notes (Signed)
Pre visit review using our clinic tool,if applicable. No additional management support is needed unless otherwise documented below in the visit note.   Patient in for BP check per Dr. Charlett Blake.  BP today = 134/82 P =65.  LPN blood pressure check note reviewed. Agree with documention and plan.  Penni Homans, MD

## 2016-06-05 ENCOUNTER — Institutional Professional Consult (permissible substitution): Payer: Federal, State, Local not specified - PPO | Admitting: Pulmonary Disease

## 2016-06-18 DIAGNOSIS — F4322 Adjustment disorder with anxiety: Secondary | ICD-10-CM | POA: Diagnosis not present

## 2016-06-22 ENCOUNTER — Telehealth: Payer: Self-pay | Admitting: Medical

## 2016-06-22 ENCOUNTER — Ambulatory Visit: Payer: Federal, State, Local not specified - PPO | Admitting: Family Medicine

## 2016-06-22 MED ORDER — OSELTAMIVIR PHOSPHATE 75 MG PO CAPS: 75.0000 mg | ORAL_CAPSULE | Freq: Every day | ORAL | 0 refills | Status: DC

## 2016-06-22 NOTE — Telephone Encounter (Signed)
rx preventative flu tx since pt husband has flu.

## 2016-06-25 ENCOUNTER — Ambulatory Visit (INDEPENDENT_AMBULATORY_CARE_PROVIDER_SITE_OTHER): Payer: Federal, State, Local not specified - PPO | Admitting: Family Medicine

## 2016-06-25 ENCOUNTER — Encounter: Payer: Self-pay | Admitting: Family Medicine

## 2016-06-25 VITALS — BP 130/80 | HR 76 | Temp 98.0°F | Ht 63.0 in | Wt 224.2 lb

## 2016-06-25 DIAGNOSIS — Z20828 Contact with and (suspected) exposure to other viral communicable diseases: Secondary | ICD-10-CM

## 2016-06-25 DIAGNOSIS — Z20818 Contact with and (suspected) exposure to other bacterial communicable diseases: Secondary | ICD-10-CM

## 2016-06-25 DIAGNOSIS — J069 Acute upper respiratory infection, unspecified: Secondary | ICD-10-CM

## 2016-06-25 MED ORDER — PENICILLIN G BENZATHINE 1200000 UNIT/2ML IM SUSP
1.2000 10*6.[IU] | Freq: Once | INTRAMUSCULAR | Status: AC
  Administered 2016-06-25: 1.2 10*6.[IU] via INTRAMUSCULAR

## 2016-06-25 NOTE — Progress Notes (Signed)
Chief Complaint  Patient presents with  . Sore Throat    X 1 day  . Headache    X 1 day    HPI   Onset of symptoms  ago with progressive symptoms that include fevers, chills, headaches, sore throat  Denies nausea and diarrhea.   There isnt history of asthma Patient did not receive a flu vaccine   Her husband is positive for strep and flu She reports that she did not take the Tamiflu on Friday   Past Medical History:  Diagnosis Date  . Anxiety   . Cancer ([place]) 2008   skin cancer, BCC  . Constipation 11/14/2014  . Depression   . Generalized anxiety disorder   . GERD (gastroesophageal reflux disease)   . Headache 08/24/2015  . Hyperlipidemia    Elevated, but never taken medication  . Hypertension   . Insomnia 08/24/2015  . Obesity 11/14/2014  . Pain in joint, shoulder region 11/27/2015  . Post-menopause 08/23/2015  . Preventative health care 08/23/2015  . Vitamin B12 deficiency 08/23/2015  . Vitamin D deficiency 08/23/2015    Current Outpatient Prescriptions  Medication Sig Dispense Refill  . cholecalciferol (VITAMIN D) 1000 units tablet Take 3,000 Units by mouth daily.    . DULoxetine (CYMBALTA) 60 MG capsule Take 1 capsule by mouth daily.    . hydrochlorothiazide (HYDRODIURIL) 12.5 MG tablet TAKE ONE TABLET BY MOUTH DAILY 30 tablet 0  . LORazepam (ATIVAN) 0.5 MG tablet Take 0.5 mg by mouth daily.     . metoprolol succinate (TOPROL-XL) 50 MG 24 hr tablet Take 1 tablet (50 mg total) by mouth daily. Take with or immediately following a meal. 30 tablet 5  . oseltamivir (TAMIFLU) 75 MG capsule Take 1 capsule (75 mg total) by mouth daily. (Patient not taking: Reported on 06/25/2016) 10 capsule 0  . vitamin B-12 (CYANOCOBALAMIN) 250 MCG tablet Take 250 mcg by mouth daily. Reported on 11/15/2015    . Vitamin D, Ergocalciferol, (DRISDOL) 50000 units CAPS capsule Take 1 capsule (50,000 Units total) by mouth once a week. (Patient not taking: Reported on 06/25/2016) 4 capsule 4   No  current facility-administered medications for this visit.     Allergies:  Allergies  Allergen Reactions  . Oxycodone Itching  . Losartan Other (See Comments)    Diarrhea and abodminal cramping    Past Surgical History:  Procedure Laterality Date  . ABDOMINAL HYSTERECTOMY  2010  . BREAST BIOPSY     right, 1996  . ECTOPIC PREGNANCY SURGERY    . TUBAL LIGATION     on right    Social History   Social History  . Marital status: Married    Spouse name: N/A  . Number of children: N/A  . Years of education: 100   Occupational History  . claims representative    Social History Main Topics  . Smoking status: Never Smoker  . Smokeless tobacco: Never Used  . Alcohol use No  . Drug use: No  . Sexual activity: Yes     Comment: works at [employer] off in [place], [employer] lives with husband   Other Topics Concern  . None   Social History Narrative  . None    ROS  Objective: Vitals:   06/25/16 0938  BP: 130/80  Pulse: 76  Temp: 98 F (36.7 C)  TempSrc: Oral  SpO2: 98%  Weight: 224 lb 3.2 oz (101.7 kg)  Height: 5\' 3"  (1.6 m)    Physical Exam General: alert,  oriented, in NAD Head: normocephalic, atraumatic, no sinus tenderness Eyes: EOM intact, no scleral icterus or conjunctival injection Ears: TM clear bilaterally Throat: no pharyngeal exudate or erythema Lymph: no posterior auricular, submental or cervical lymph adenopathy Heart: normal rate, normal sinus rhythm, no murmurs Lungs: clear to auscultation bilaterally, no wheezing   Assessment and Plan Julie Bishop was seen today for sore throat and headache.  Diagnoses and all orders for this visit:  Exposure to influenza- pt already had tamiflu called in to the Pharmacy on Friday but did not take it. Advised to start taking one capsule daily for 10 days  Exposure to group A Streptococcus- will treat empirically -     penicillin g benzathine (BICILLIN LA) 1200000 UNIT/2ML injection 1.2 Million  Units; Inject 2 mLs (1.2 Million Units total) into the muscle once.  Upper respiratory tract infection, unspecified type -     penicillin g benzathine (BICILLIN LA) 1200000 UNIT/2ML injection 1.2 Million Units; Inject 2 mLs (1.2 Million Units total) into the muscle once.     [place]

## 2016-06-25 NOTE — Patient Instructions (Addendum)
     IF you received an x-ray today, you will receive an invoice from [facility]. Please contact [facility] at [phone] with questions or concerns regarding your invoice.   IF you received labwork today, you will receive an invoice from [facility]. Please contact [facility] at [phone] with questions or concerns regarding your invoice.   Our billing staff will not be able to assist you with questions regarding bills from these companies.  You will be contacted with the lab results as soon as they are available. The fastest way to get your results is to activate your My Chart account. Instructions are located on the last page of this paperwork. If you have not heard from [place] regarding the results in 2 weeks, please contact this office.    , Upper Respiratory Infection, Adult Most upper respiratory infections (URIs) are caused by a virus. A URI affects the nose, throat, and upper air passages. The most common type of URI is often called "the common cold." Follow these instructions at home:  Take medicines only as told by your doctor.  Gargle warm saltwater or take cough drops to comfort your throat as told by your doctor.  Use a warm mist humidifier or inhale steam from a shower to increase air moisture. This may make it easier to breathe.  Drink enough fluid to keep your pee (urine) clear or pale yellow.  Eat soups and other clear broths.  Have a healthy diet.  Rest as needed.  Go back to work when your fever is gone or your doctor says it is okay.  You may need to stay home longer to avoid giving your URI to others.  You can also wear a face mask and wash your hands often to prevent spread of the virus.  Use your inhaler more if you have asthma.  Do not use any tobacco products, including cigarettes, chewing tobacco, or electronic cigarettes. If you need help quitting, ask your doctor. Contact a doctor if:  You are getting worse, not better.  Your  symptoms are not helped by medicine.  You have chills.  You are getting more short of breath.  You have brown or red mucus.  You have yellow or brown discharge from your nose.  You have pain in your face, especially when you bend forward.  You have a fever.  You have puffy (swollen) neck glands.  You have pain while swallowing.  You have white areas in the back of your throat. Get help right away if:  You have very bad or constant:  Headache.  Ear pain.  Pain in your forehead, behind your eyes, and over your cheekbones (sinus pain).  Chest pain.  You have long-lasting (chronic) lung disease and any of the following:  Wheezing.  Long-lasting cough.  Coughing up blood.  A change in your usual mucus.  You have a stiff neck.  You have changes in your:  Vision.  Hearing.  Thinking.  Mood. This information is not intended to replace advice given to you by your health care provider. Make sure you discuss any questions you have with your health care provider. Document Released: 10/31/2007 Document Revised: 01/15/2016 Document Reviewed: 08/19/2013 Elsevier Interactive Patient Education  2017 [employer].

## 2016-06-28 ENCOUNTER — Ambulatory Visit (INDEPENDENT_AMBULATORY_CARE_PROVIDER_SITE_OTHER): Payer: Federal, State, Local not specified - PPO | Admitting: Family Medicine

## 2016-06-28 ENCOUNTER — Encounter: Payer: Self-pay | Admitting: Family Medicine

## 2016-06-28 VITALS — BP 132/91 | HR 75 | Temp 98.4°F | Wt 227.2 lb

## 2016-06-28 DIAGNOSIS — R079 Chest pain, unspecified: Secondary | ICD-10-CM

## 2016-06-28 DIAGNOSIS — E559 Vitamin D deficiency, unspecified: Secondary | ICD-10-CM | POA: Diagnosis not present

## 2016-06-28 DIAGNOSIS — E782 Mixed hyperlipidemia: Secondary | ICD-10-CM | POA: Diagnosis not present

## 2016-06-28 DIAGNOSIS — R0602 Shortness of breath: Secondary | ICD-10-CM

## 2016-06-28 DIAGNOSIS — E538 Deficiency of other specified B group vitamins: Secondary | ICD-10-CM

## 2016-06-28 DIAGNOSIS — I10 Essential (primary) hypertension: Secondary | ICD-10-CM | POA: Diagnosis not present

## 2016-06-28 DIAGNOSIS — K219 Gastro-esophageal reflux disease without esophagitis: Secondary | ICD-10-CM

## 2016-06-28 LAB — LIPID PANEL
CHOL/HDL RATIO: 3
Cholesterol: 219 mg/dL — ABNORMAL HIGH (ref 0–200)
HDL: 76 mg/dL (ref >39.00)
LDL CALC: 118 mg/dL — AB (ref 0–99)
NonHDL: 143.45
TRIGLYCERIDES: 125 mg/dL (ref 0.0–149.0)
VLDL: 25 mg/dL (ref 0.0–40.0)

## 2016-06-28 LAB — COMPREHENSIVE METABOLIC PANEL
ALT: 17 U/L (ref 0–35)
AST: 15 U/L (ref 0–37)
Albumin: 3.9 g/dL (ref 3.5–5.2)
Alkaline Phosphatase: 42 U/L (ref 39–117)
BUN: 13 mg/dL (ref 6–23)
CALCIUM: 9.4 mg/dL (ref 8.4–10.5)
CHLORIDE: 102 meq/L (ref 96–112)
CO2: 30 meq/L (ref 19–32)
CREATININE: 0.61 mg/dL (ref 0.40–1.20)
GFR: 112.62 mL/min (ref >60.00)
Glucose, Bld: 97 mg/dL (ref 70–99)
Potassium: 3.7 mEq/L (ref 3.5–5.1)
SODIUM: 136 meq/L (ref 135–145)
Total Bilirubin: 0.3 mg/dL (ref 0.2–1.2)
Total Protein: 7.1 g/dL (ref 6.0–8.3)

## 2016-06-28 LAB — CBC
HCT: 34.7 % — ABNORMAL LOW (ref 36.0–46.0)
Hemoglobin: 11.8 g/dL — ABNORMAL LOW (ref 12.0–15.0)
MCHC: 34.1 g/dL (ref 30.0–36.0)
MCV: 85.7 fl (ref 78.0–100.0)
PLATELETS: 272 10*3/uL (ref 150.0–400.0)
RBC: 4.05 Mil/uL (ref 3.87–5.11)
RDW: 14.6 % (ref 11.5–15.5)
WBC: 8 10*3/uL (ref 4.0–10.5)

## 2016-06-28 LAB — VITAMIN D 25 HYDROXY (VIT D DEFICIENCY, FRACTURES): VITD: 26.45 ng/mL — ABNORMAL LOW (ref 30.00–100.00)

## 2016-06-28 LAB — TSH: TSH: 2.54 u[IU]/mL (ref 0.35–4.50)

## 2016-06-28 NOTE — Patient Instructions (Signed)
Hypertension Hypertension, commonly called high blood pressure, is when the force of blood pumping through your arteries is too strong. Your arteries are the blood vessels that carry blood from your heart throughout your body. A blood pressure reading consists of a higher number over a lower number, such as 110/72. The higher number (systolic) is the pressure inside your arteries when your heart pumps. The lower number (diastolic) is the pressure inside your arteries when your heart relaxes. Ideally you want your blood pressure below 120/80. Hypertension forces your heart to work harder to pump blood. Your arteries may become narrow or stiff. Having untreated or uncontrolled hypertension can cause heart attack, stroke, kidney disease, and other problems. What increases the risk? Some risk factors for high blood pressure are controllable. Others are not. Risk factors you cannot control include:  Race. You may be at higher risk if you are African American.  Age. Risk increases with age.  Gender. Men are at higher risk than women before age 45 years. After age 65, women are at higher risk than men. Risk factors you can control include:  Not getting enough exercise or physical activity.  Being overweight.  Getting too much fat, sugar, calories, or salt in your diet.  Drinking too much alcohol. What are the signs or symptoms? Hypertension does not usually cause signs or symptoms. Extremely high blood pressure (hypertensive crisis) may cause headache, anxiety, shortness of breath, and nosebleed. How is this diagnosed? To check if you have hypertension, your health care provider will measure your blood pressure while you are seated, with your arm held at the level of your heart. It should be measured at least twice using the same arm. Certain conditions can cause a difference in blood pressure between your right and left arms. A blood pressure reading that is higher than normal on one occasion does  not mean that you need treatment. If it is not clear whether you have high blood pressure, you may be asked to return on a different day to have your blood pressure checked again. Or, you may be asked to monitor your blood pressure at home for 1 or more weeks. How is this treated? Treating high blood pressure includes making lifestyle changes and possibly taking medicine. Living a healthy lifestyle can help lower high blood pressure. You may need to change some of your habits. Lifestyle changes may include:  Following the DASH diet. This diet is high in fruits, vegetables, and whole grains. It is low in salt, red meat, and added sugars.  Keep your sodium intake below 2,300 mg per day.  Getting at least 30-45 minutes of aerobic exercise at least 4 times per week.  Losing weight if necessary.  Not smoking.  Limiting alcoholic beverages.  Learning ways to reduce stress. Your health care provider may prescribe medicine if lifestyle changes are not enough to get your blood pressure under control, and if one of the following is true:  You are 18-59 years of age and your systolic blood pressure is above 140.  You are 60 years of age or older, and your systolic blood pressure is above 150.  Your diastolic blood pressure is above 90.  You have diabetes, and your systolic blood pressure is over 140 or your diastolic blood pressure is over 90.  You have kidney disease and your blood pressure is above 140/90.  You have heart disease and your blood pressure is above 140/90. Your personal target blood pressure may vary depending on your medical   conditions, your age, and other factors. Follow these instructions at home:  Have your blood pressure rechecked as directed by your health care provider.  Take medicines only as directed by your health care provider. Follow the directions carefully. Blood pressure medicines must be taken as prescribed. The medicine does not work as well when you skip  doses. Skipping doses also puts you at risk for problems.  Do not smoke.  Monitor your blood pressure at home as directed by your health care provider. Contact a health care provider if:  You think you are having a reaction to medicines taken.  You have recurrent headaches or feel dizzy.  You have swelling in your ankles.  You have trouble with your vision. Get help right away if:  You develop a severe headache or confusion.  You have unusual weakness, numbness, or feel faint.  You have severe chest or abdominal pain.  You vomit repeatedly.  You have trouble breathing. This information is not intended to replace advice given to you by your health care provider. Make sure you discuss any questions you have with your health care provider. Document Released: 05/14/2005 Document Revised: 10/20/2015 Document Reviewed: 03/06/2013 Elsevier Interactive Patient Education  2017 [employer] Inc.  

## 2016-06-28 NOTE — Progress Notes (Signed)
Pre visit review using our clinic review tool, if applicable. No additional management support is needed unless otherwise documented below in the visit note. 

## 2016-06-28 NOTE — Progress Notes (Signed)
Patient ID: Julie Bishop, female   DOB: 07/19/1970, 46 y.o.   MRN: [id number]

## 2016-06-28 NOTE — Progress Notes (Signed)
Subjective:    Patient ID: Julie Bishop, female    DOB: 11/04/1970, 46 y.o.   MRN: [id number]  Chief Complaint  Patient presents with  . Follow-up    HTN    HPI Patient is in today for a follow up. Patient has some complaints of edema in right foot for several weeks. Also complaints of "twinges" in her chest off and on for 2 weeks. Patient is also following up on HTN, GERD,hyperlipidemia, vitamin B and D deficiencies. She describes waking up at times feeling SOB and other times SOB with exertion is worsening. She notes some episodes of thoracic back pain at times as well but not associated in time with the other symptoms. She admits these symptoms are worsening her anxiety. She has noted some right foot pain when her foot is cold and has been told she has Raynaud's. She finally endorses intermittent headaches and fatigue.   I acted as a Education administrator for Penni Homans, MD. Raiford Noble, [place]   Past Medical History:  Diagnosis Date  . Anxiety   . Cancer ([place]) 2008   skin cancer, BCC  . Constipation 11/14/2014  . Depression   . Generalized anxiety disorder   . GERD (gastroesophageal reflux disease)   . Headache 08/24/2015  . Hyperlipidemia    Elevated, but never taken medication  . Hypertension   . Insomnia 08/24/2015  . Obesity 11/14/2014  . Pain in joint, shoulder region 11/27/2015  . Post-menopause 08/23/2015  . Preventative health care 08/23/2015  . Vitamin B12 deficiency 08/23/2015  . Vitamin D deficiency 08/23/2015    Past Surgical History:  Procedure Laterality Date  . ABDOMINAL HYSTERECTOMY  2010  . BREAST BIOPSY     right, 1996  . ECTOPIC PREGNANCY SURGERY    . TUBAL LIGATION     on right    Family History  Problem Relation Age of Onset  . Heart disease Mother     CAD w/MI in mid 53s  . Diabetes Mother   . Depression Mother   . Diabetes Father   . Cancer Maternal Grandmother     throat  . Heart disease Maternal Grandmother   . Cancer Paternal Grandmother    lung  . Heart disease Paternal Grandmother   . Hypertension Sister     as a teenager    Social History   Social History  . Marital status: Married    Spouse name: N/A  . Number of children: N/A  . Years of education: 40   Occupational History  . claims representative    Social History Main Topics  . Smoking status: Never Smoker  . Smokeless tobacco: Never Used  . Alcohol use No  . Drug use: No  . Sexual activity: Yes     Comment: works at [employer] off in [place], [employer] lives with husband   Other Topics Concern  . Not on file   Social History Narrative  . No narrative on file    Outpatient Medications Prior to Visit  Medication Sig Dispense Refill  . DULoxetine (CYMBALTA) 60 MG capsule Take 1 capsule by mouth daily.    . hydrochlorothiazide (HYDRODIURIL) 12.5 MG tablet TAKE ONE TABLET BY MOUTH DAILY 30 tablet 0  . LORazepam (ATIVAN) 0.5 MG tablet Take 0.5 mg by mouth daily.     . metoprolol succinate (TOPROL-XL) 50 MG 24 hr tablet Take 1 tablet (50 mg total) by mouth daily. Take with or immediately following a meal. 30 tablet 5  .  oseltamivir (TAMIFLU) 75 MG capsule Take 1 capsule (75 mg total) by mouth daily. 10 capsule 0  . vitamin B-12 (CYANOCOBALAMIN) 250 MCG tablet Take 250 mcg by mouth daily. Reported on 11/15/2015    . cholecalciferol (VITAMIN D) 1000 units tablet Take 3,000 Units by mouth daily.    . Vitamin D, Ergocalciferol, (DRISDOL) 50000 units CAPS capsule Take 1 capsule (50,000 Units total) by mouth once a week. (Patient not taking: Reported on 06/28/2016) 4 capsule 4   No facility-administered medications prior to visit.     Allergies  Allergen Reactions  . Oxycodone Itching  . Losartan Other (See Comments)    Diarrhea and abodminal cramping    Review of Systems  Constitutional: Positive for malaise/fatigue. Negative for fever.  HENT: Negative for congestion.   Eyes: Negative for blurred vision.  Respiratory: Positive for shortness of  breath. Negative for cough.   Cardiovascular: Positive for leg swelling. Negative for palpitations.       Right foot swelling.  Some "twinging" (chest pain. "Here and There".  Gastrointestinal: Negative for vomiting.  Musculoskeletal: Positive for back pain and joint pain.  Skin: Negative for rash.  Neurological: Positive for headaches. Negative for loss of consciousness.  Psychiatric/Behavioral: Positive for depression. The patient is nervous/anxious.        Objective:    Physical Exam  Constitutional: She is oriented to person, place, and time. She appears well-developed and well-nourished. No distress.  HENT:  Head: Normocephalic and atraumatic.  Eyes: Conjunctivae are normal.  Neck: Normal range of motion. No thyromegaly present.  Cardiovascular: Normal rate and regular rhythm.   Pulmonary/Chest: Effort normal and breath sounds normal. She has no wheezes.  Abdominal: Soft. Bowel sounds are normal. There is no tenderness.  Musculoskeletal: She exhibits no edema or deformity.  Neurological: She is alert and oriented to person, place, and time.  Skin: Skin is warm and dry. She is not diaphoretic.  Psychiatric: She has a normal mood and affect.    BP (!) 132/91 (BP Location: Left Arm, Patient Position: Sitting, Cuff Size: Large)   Pulse 75   Temp 98.4 F (36.9 C) (Oral)   Wt 227 lb 3.2 oz (103.1 kg)   SpO2 100% Comment: RA  BMI 40.25 kg/m  Wt Readings from Last 3 Encounters:  06/28/16 227 lb 3.2 oz (103.1 kg)  06/25/16 224 lb 3.2 oz (101.7 kg)  03/26/16 219 lb 8 oz (99.6 kg)     Lab Results  Component Value Date   WBC 8.0 06/28/2016   HGB 11.8 (L) 06/28/2016   HCT 34.7 (L) 06/28/2016   PLT 272.0 06/28/2016   GLUCOSE 97 06/28/2016   CHOL 219 (H) 06/28/2016   TRIG 125.0 06/28/2016   HDL 76.00 06/28/2016   LDLCALC 118 (H) 06/28/2016   ALT 17 06/28/2016   AST 15 06/28/2016   NA 136 06/28/2016   K 3.7 06/28/2016   CL 102 06/28/2016   CREATININE 0.61 06/28/2016     BUN 13 06/28/2016   CO2 30 06/28/2016   TSH 2.54 06/28/2016   HGBA1C 5.2 08/23/2015    Lab Results  Component Value Date   TSH 2.54 06/28/2016   Lab Results  Component Value Date   WBC 8.0 06/28/2016   HGB 11.8 (L) 06/28/2016   HCT 34.7 (L) 06/28/2016   MCV 85.7 06/28/2016   PLT 272.0 06/28/2016   Lab Results  Component Value Date   NA 136 06/28/2016   K 3.7 06/28/2016   CO2 30 06/28/2016  GLUCOSE 97 06/28/2016   BUN 13 06/28/2016   CREATININE 0.61 06/28/2016   BILITOT 0.3 06/28/2016   ALKPHOS 42 06/28/2016   AST 15 06/28/2016   ALT 17 06/28/2016   PROT 7.1 06/28/2016   ALBUMIN 3.9 06/28/2016   CALCIUM 9.4 06/28/2016   GFR 112.62 06/28/2016   Lab Results  Component Value Date   CHOL 219 (H) 06/28/2016   Lab Results  Component Value Date   HDL 76.00 06/28/2016   Lab Results  Component Value Date   LDLCALC 118 (H) 06/28/2016   Lab Results  Component Value Date   TRIG 125.0 06/28/2016   Lab Results  Component Value Date   CHOLHDL 3 06/28/2016   Lab Results  Component Value Date   HGBA1C 5.2 08/23/2015       Assessment & Plan:   Problem List Items Addressed This Visit    GERD (gastroesophageal reflux disease)    Avoid offending foods, start probiotics. Do not eat large meals in late evening and consider raising head of bed. May be contributing to chest pain, encouraged to pick up some mylanta and use it if pain recurs to see if it helps.       Hyperlipidemia    Encouraged heart healthy diet, increase exercise, avoid trans fats, consider a krill oil cap daily      Relevant Orders   Lipid panel (Completed)   Hypertension    Well controlled, no changes to meds. Encouraged heart healthy diet such as the DASH diet and exercise as tolerated.       Relevant Orders   CBC (Completed)   Comprehensive metabolic panel (Completed)   TSH (Completed)   Vitamin D deficiency    Takes daily supplements      Relevant Orders   VITAMIN D 25 Hydroxy  (Vit-D Deficiency, Fractures) (Completed)   Vitamin B12 deficiency   SOB (shortness of breath)    Increased recently and patient very anxious about worsening. EKG unremarkable today but will proceed with with Echo and cardiology referral due to risk factos including mother with MI prior to 49.      Relevant Orders   Ambulatory referral to Cardiology   ECHOCARDIOGRAM COMPLETE   Chest pain - Primary    No pain today and pain not suggestive of cardiac symptoms but will proceed with further evalauation due to risk factors.       Relevant Orders   EKG 12-Lead (Completed)   Ambulatory referral to Cardiology      I have discontinued Ms. Bastin's cholecalciferol and Vitamin D (Ergocalciferol). I am also having her maintain her DULoxetine, LORazepam, vitamin B-12, metoprolol succinate, hydrochlorothiazide, oseltamivir, and Cholecalciferol (VITAMIN D PO).  Meds ordered this encounter  Medications  . Cholecalciferol (VITAMIN D PO)    Sig: Take 4,000 Units by mouth daily. Take one 4,000 IU po qd.    CMA served as Education administrator during this visit. History, Physical and Plan performed by medical provider. Documentation and orders reviewed and attested to.  Penni Homans, MD

## 2016-06-29 ENCOUNTER — Other Ambulatory Visit: Payer: Self-pay | Admitting: Family Medicine

## 2016-06-29 MED ORDER — VITAMIN D (ERGOCALCIFEROL) 1.25 MG (50000 UNIT) PO CAPS: 50000.0000 [IU] | ORAL_CAPSULE | ORAL | 4 refills | Status: DC

## 2016-07-01 DIAGNOSIS — R079 Chest pain, unspecified: Secondary | ICD-10-CM

## 2016-07-01 DIAGNOSIS — R0602 Shortness of breath: Secondary | ICD-10-CM | POA: Insufficient documentation

## 2016-07-01 HISTORY — DX: Chest pain, unspecified: R07.9

## 2016-07-01 NOTE — Assessment & Plan Note (Signed)
Takes daily supplements

## 2016-07-01 NOTE — Assessment & Plan Note (Signed)
No pain today and pain not suggestive of cardiac symptoms but will proceed with further evalauation due to risk factors.

## 2016-07-01 NOTE — Assessment & Plan Note (Signed)
Increased recently and patient very anxious about worsening. EKG unremarkable today but will proceed with with Echo and cardiology referral due to risk factos including mother with MI prior to 29.

## 2016-07-01 NOTE — Assessment & Plan Note (Signed)
Avoid offending foods, start probiotics. Do not eat large meals in late evening and consider raising head of bed. May be contributing to chest pain, encouraged to pick up some mylanta and use it if pain recurs to see if it helps.

## 2016-07-01 NOTE — Assessment & Plan Note (Signed)
Well controlled, no changes to meds. Encouraged heart healthy diet such as the DASH diet and exercise as tolerated.  °

## 2016-07-01 NOTE — Assessment & Plan Note (Signed)
Encouraged heart healthy diet, increase exercise, avoid trans fats, consider a krill oil cap daily 

## 2016-07-03 ENCOUNTER — Encounter (INDEPENDENT_AMBULATORY_CARE_PROVIDER_SITE_OTHER): Payer: Federal, State, Local not specified - PPO | Admitting: Family Medicine

## 2016-07-04 ENCOUNTER — Ambulatory Visit (HOSPITAL_BASED_OUTPATIENT_CLINIC_OR_DEPARTMENT_OTHER)
Admission: RE | Admit: 2016-07-04 | Discharge: 2016-07-04 | Disposition: A | Discharge status: Home / Self Care | Payer: Federal, State, Local not specified - PPO | Source: Ambulatory Visit | Attending: Family Medicine | Admitting: Family Medicine

## 2016-07-04 DIAGNOSIS — R0602 Shortness of breath: Secondary | ICD-10-CM | POA: Diagnosis not present

## 2016-07-04 DIAGNOSIS — R0600 Dyspnea, unspecified: Secondary | ICD-10-CM | POA: Diagnosis not present

## 2016-07-04 NOTE — Progress Notes (Signed)
  Echocardiogram 2D Echocardiogram has been performed.  Tresa Res 07/04/2016, 10:05 AM

## 2016-07-05 ENCOUNTER — Telehealth: Payer: Self-pay | Admitting: Family Medicine

## 2016-07-05 ENCOUNTER — Ambulatory Visit (INDEPENDENT_AMBULATORY_CARE_PROVIDER_SITE_OTHER): Payer: Federal, State, Local not specified - PPO | Admitting: Family Medicine

## 2016-07-05 ENCOUNTER — Encounter: Payer: Self-pay | Admitting: Family Medicine

## 2016-07-05 VITALS — BP 148/100 | HR 74 | Temp 98.3°F | Ht 63.0 in | Wt 224.8 lb

## 2016-07-05 DIAGNOSIS — R0683 Snoring: Secondary | ICD-10-CM

## 2016-07-05 DIAGNOSIS — E6609 Other obesity due to excess calories: Secondary | ICD-10-CM | POA: Diagnosis not present

## 2016-07-05 DIAGNOSIS — R631 Polydipsia: Secondary | ICD-10-CM | POA: Diagnosis not present

## 2016-07-05 DIAGNOSIS — Z6839 Body mass index (BMI) 39.0-39.9, adult: Secondary | ICD-10-CM | POA: Diagnosis not present

## 2016-07-05 DIAGNOSIS — I10 Essential (primary) hypertension: Secondary | ICD-10-CM

## 2016-07-05 MED ORDER — AMLODIPINE BESYLATE 5 MG PO TABS: 5.0000 mg | ORAL_TABLET | Freq: Every day | ORAL | 1 refills | Status: DC

## 2016-07-05 NOTE — Telephone Encounter (Signed)
Patient called stating that she has had splitting headaches the last two days when she has woken up in the morning. She states that she thinks her pressures are high. Patient said that yesterday the headache subsided but today it has not. She would like to get a note back to Dr. Charlett Blake regarding this. Patient transferred to team health to be evaluated.

## 2016-07-05 NOTE — Progress Notes (Signed)
Chief Complaint  Patient presents with  . Headache    x 2 days  . Hypertension    Subjective Julie Bishop is a 46 y.o. female who presents for hypertension follow up. She does not routinely monitor home blood pressures. She is compliant with medications- Toprol XL 50 mg daily, chlorothiazide 25 mg daily. Patient has these side effects of medication: none She is not adhering to a healthy diet overall. Current exercise: none   She also has has a history of dry mouth, morning headache, fatigue, obesity, and snoring. She will wake up in the middle of the night with a dry mouth. She will subsequently drink more water. She does have a fam hx of DM and would like to get checked today. Her PCP referred her to a sleep specialist, however she never set up the appointment. For her headaches, she has been experiencing them over the past 2 days mainly. She's tried taking Excedrin, which has been helpful in the past, but is not now. They tend to resolve as the day progresses.   Past Medical History:  Diagnosis Date  . Anxiety   . Cancer ([place]) 2008   skin cancer, BCC  . Constipation 11/14/2014  . Depression   . Generalized anxiety disorder   . GERD (gastroesophageal reflux disease)   . Headache 08/24/2015  . Hyperlipidemia    Elevated, but never taken medication  . Hypertension   . Insomnia 08/24/2015  . Obesity 11/14/2014  . Pain in joint, shoulder region 11/27/2015  . Post-menopause 08/23/2015  . Preventative health care 08/23/2015  . Vitamin B12 deficiency 08/23/2015  . Vitamin D deficiency 08/23/2015   Family History  Problem Relation Age of Onset  . Heart disease Mother     CAD w/MI in mid 84s  . Diabetes Mother   . Depression Mother   . Diabetes Father   . Cancer Maternal Grandmother     throat  . Heart disease Maternal Grandmother   . Cancer Paternal Grandmother     lung  . Heart disease Paternal Grandmother   . Hypertension Sister     as a teenager   Medications Current  Outpatient Prescriptions on File Prior to Visit  Medication Sig Dispense Refill  . Cholecalciferol (VITAMIN D PO) Take 5,000 Units by mouth daily. Take one 4,000 IU po qd.     . DULoxetine (CYMBALTA) 60 MG capsule Take 1 capsule by mouth daily.    . hydrochlorothiazide (HYDRODIURIL) 12.5 MG tablet TAKE ONE TABLET BY MOUTH DAILY 30 tablet 0  . LORazepam (ATIVAN) 0.5 MG tablet Take 0.5 mg by mouth daily.     . metoprolol succinate (TOPROL-XL) 50 MG 24 hr tablet Take 1 tablet (50 mg total) by mouth daily. Take with or immediately following a meal. 30 tablet 5  . Vitamin D, Ergocalciferol, (DRISDOL) 50000 units CAPS capsule Take 1 capsule (50,000 Units total) by mouth every 7 (seven) days. 4 capsule 4  . vitamin B-12 (CYANOCOBALAMIN) 250 MCG tablet Take 250 mcg by mouth daily. Reported on 11/15/2015     Allergies Allergies  Allergen Reactions  . Oxycodone Itching  . Losartan Other (See Comments)    Diarrhea and abodminal cramping    Review of Systems Cardiovascular: no chest pain Respiratory:  +intermittent shortness of breath, none currently  Exam BP (!) 148/100 (BP Location: Left Arm, Patient Position: Sitting, Cuff Size: Large)   Pulse 74   Temp 98.3 F (36.8 C) (Oral)   Ht 5\' 3"  (1.6 m)  Wt 224 lb 12.8 oz (102 kg)   SpO2 98%   BMI 39.82 kg/m  General:  well developed, well nourished, in no apparent distress Skin:  warm, no pallor or diaphoresis Eyes:  pupils equal and round, sclera anicteric without injection Heart :RRR, no murmurs, no bruits Lungs:  clear to auscultation, no accessory muscle use Psych: well oriented with normal range of affect and appropriate judgment/insight  Essential hypertension - Plan: amLODipine (NORVASC) 5 MG tablet, hydrochlorothiazide (HYDRODIURIL) 25 MG tablet, Ambulatory referral to Pulmonology  Class 2 obesity due to excess calories with body mass index (BMI) of 39.0 to 39.9 in adult, unspecified whether serious comorbidity  present  Snoring  Polydipsia - Plan: Hemoglobin A1c  Orders as above. Will add Norvasc. Will re-refer to sleep medicine. This is likely the root of her issues. Discussed the increase risk of disease should she remain untreated. Counsled on diet and exercise. Briefly reviewed Echo results with patient. F/u in with reg PCP in 1 mo for BP visit. The patient voiced understanding and agreement to the plan.  [place], DO 07/05/16  4:13 PM

## 2016-07-05 NOTE — Telephone Encounter (Signed)
Patient Name: Julie Bishop  DOB: Feb 27, 1971    Initial Comment severe headache, BP 152/103   Nurse Assessment  Nurse: Raphael Gibney, RN, [place] Date/Time (Eastern Time): 07/05/2016 12:31:03 PM  Confirm and document reason for call. If symptomatic, describe symptoms. ---Caller states her BP is 152/103. she has a severe headache. she takes 2 BP medications. Has taken excedrin over 2 hrs ago which did not help the headache.  Does the patient have any new or worsening symptoms? ---Yes  Will a triage be completed? ---Yes  Related visit to physician within the last 2 weeks? ---No  Does the PT have any chronic conditions? (i.e. diabetes, asthma, etc.) ---Yes  List chronic conditions. ---vitamin D deficiency; HTN;  Is the patient pregnant or possibly pregnant? (Ask all females between the ages of 100-55) ---No  Is this a behavioral health or substance abuse call? ---No     Guidelines    Guideline Title Affirmed Question Affirmed Notes  High Blood Pressure [1] BP ? 140/90 AND [2] taking BP medications   Headache [1] SEVERE headache (e.g., excruciating) AND [2] not improved after 2 hours of pain medicine    Final Disposition User   See Physician within 4 Hours (or PCP triage) Raphael Gibney, RN, Vanita Ingles    Comments  triage outcome upgraded to see physician within 4 hrs as she has a severe headache.  appt scheduled for 07/05/2016 at 3 pm with Riki Sheer   Referrals  REFERRED TO PCP OFFICE   Disagree/Comply: Comply    Disagree/Comply: Comply

## 2016-07-05 NOTE — Progress Notes (Signed)
Pre visit review using our clinic review tool, if applicable. No additional management support is needed unless otherwise documented below in the visit note. 

## 2016-07-05 NOTE — Patient Instructions (Signed)

## 2016-07-06 LAB — HEMOGLOBIN A1C: Hgb A1c MFr Bld: 5.6 % (ref 4.6–6.5)

## 2016-07-06 NOTE — Telephone Encounter (Signed)
Pt was seen by Dr. Nani Ravens on 07/05/16.

## 2016-07-12 ENCOUNTER — Encounter (INDEPENDENT_AMBULATORY_CARE_PROVIDER_SITE_OTHER): Payer: Self-pay | Admitting: Family Medicine

## 2016-07-12 ENCOUNTER — Ambulatory Visit (INDEPENDENT_AMBULATORY_CARE_PROVIDER_SITE_OTHER): Payer: Federal, State, Local not specified - PPO | Admitting: Family Medicine

## 2016-07-12 VITALS — BP 133/83 | HR 80 | Temp 98.6°F | Resp 14 | Ht 63.0 in | Wt 213.0 lb

## 2016-07-12 DIAGNOSIS — Z6837 Body mass index (BMI) 37.0-37.9, adult: Secondary | ICD-10-CM

## 2016-07-12 DIAGNOSIS — Z9189 Other specified personal risk factors, not elsewhere classified: Secondary | ICD-10-CM

## 2016-07-12 DIAGNOSIS — I5189 Other ill-defined heart diseases: Secondary | ICD-10-CM

## 2016-07-12 DIAGNOSIS — Z1389 Encounter for screening for other disorder: Secondary | ICD-10-CM

## 2016-07-12 DIAGNOSIS — E669 Obesity, unspecified: Secondary | ICD-10-CM

## 2016-07-12 DIAGNOSIS — Z0289 Encounter for other administrative examinations: Secondary | ICD-10-CM

## 2016-07-12 DIAGNOSIS — R0602 Shortness of breath: Secondary | ICD-10-CM

## 2016-07-12 DIAGNOSIS — I519 Heart disease, unspecified: Secondary | ICD-10-CM | POA: Diagnosis not present

## 2016-07-12 DIAGNOSIS — R5383 Other fatigue: Secondary | ICD-10-CM | POA: Diagnosis not present

## 2016-07-12 DIAGNOSIS — Z1331 Encounter for screening for depression: Secondary | ICD-10-CM

## 2016-07-12 NOTE — Progress Notes (Signed)
Office: [phone]  /  Fax: [phone]   HPI:   Chief Complaint: Julie Bishop (MR# [id number]) is a 46 y.o. female who presents on 07/12/2016 for obesity evaluation and treatment. Current BMI is Body mass index is 37.73 kg/m.Julie Bishop Chai has struggled with obesity for years and has been unsuccessful in either losing weight or maintaining long term weight loss. [name] attended our information session and states she is currently in the action stage of change and ready to dedicate time achieving and maintaining a healthier weight.  [name] states her desired weight loss is 66 she has been heavy most of  her life she started gaining weight after giving birth to her son 21 years ago.  her heaviest weight ever was 230 lbs. she snacks frequently in the evenings she frequently makes poor food choices she frequently eats larger portions than normal  she struggles with emotional eating    Fatigue Julie Bishop feels her energy is lower than it should be. This has worsened with weight gain and has worsened recently. Julie Bishop admits to daytime somnolence and  admits to waking up still tired. Patient is at risk for obstructive sleep apnea. Patent has a history of symptoms of morning fatigue and morning headache. Patient generally gets 8 hours of sleep per night, and states they generally have difficulty falling back asleep if awakened. Snoring is present. Apneic episodes are not present. Epworth Sleepiness Score is 4.   Dyspnea on exertion Julie Bishop notes increasing shortness of breath with exercising and seems to be worsening over the last 4-6 weeks. She notes getting out of breath sooner with activity than she used to. This has gotten worse recently. Julie Bishop denies orthopnea.  Grade I Diastolic Dysfunction This is per her most recent with an increase in shortness of breath with activity in the last 4-6 weeks  At risk for cardiovascular disease Julie Bishop is at a higher than  average risk for cardiovascular disease due to obesity. She currently denies any chest pain.  Depression Screen Julie Bishop's Food and Mood (modified PHQ-9) score was  Depression screen PHQ 2/9 07/12/2016  Decreased Interest 3  Down, Depressed, Hopeless 3  PHQ - 2 Score 6  Altered sleeping 3  Tired, decreased energy 3  Change in appetite 2  Feeling bad or failure about yourself  3  Trouble concentrating 0  Moving slowly or fidgety/restless 1  Suicidal thoughts 0  PHQ-9 Score 18    ALLERGIES: Allergies  Allergen Reactions  . Oxycodone Itching  . Losartan Other (See Comments)    Diarrhea and abodminal cramping    MEDICATIONS: Current Outpatient Prescriptions on File Prior to Visit  Medication Sig Dispense Refill  . amLODipine (NORVASC) 5 MG tablet Take 1 tablet (5 mg total) by mouth daily. 30 tablet 1  . Cholecalciferol (VITAMIN D PO) Take 5,000 Units by mouth daily. Take one 4,000 IU po qd.     . DULoxetine (CYMBALTA) 60 MG capsule Take 1 capsule by mouth daily.    . hydrochlorothiazide (HYDRODIURIL) 25 MG tablet Take 1 tablet (25 mg total) by mouth daily. 90 tablet 3  . LORazepam (ATIVAN) 0.5 MG tablet Take 0.5 mg by mouth daily.     . metoprolol succinate (TOPROL-XL) 50 MG 24 hr tablet Take 1 tablet (50 mg total) by mouth daily. Take with or immediately following a meal. 30 tablet 5  . Vitamin D, Ergocalciferol, (DRISDOL) 50000 units CAPS capsule Take 1 capsule (50,000 Units total) by mouth every 7 (seven) days. 4 capsule  4   No current facility-administered medications on file prior to visit.     PAST MEDICAL HISTORY: Past Medical History:  Diagnosis Date  . Adjustment disorder   . Anemia   . Anxiety   . Back pain   . Cancer ([place]) 2008   skin cancer, BCC  . Chest discomfort    at times  . Constipation 11/14/2014  . Depression   . Edema    feet and legs  . Generalized anxiety disorder   . GERD (gastroesophageal reflux disease)   . Headache 08/24/2015  . Heart  valve problem    trivial leak in mitral valve    trivial leak in tricuspid  . Hyperlipidemia    Elevated, but never taken medication  . Hypertension   . Insomnia 08/24/2015  . Joint pain   . Obesity 11/14/2014  . Pain in joint, shoulder region 11/27/2015  . Palpitations   . Post-menopause 08/23/2015  . Preventative health care 08/23/2015  . Raynauds syndrome   . Sleep apnea    appt with pulmonary Dr in march 2018  . SOB (shortness of breath)   . Vitamin B12 deficiency 08/23/2015  . Vitamin D deficiency 08/23/2015    PAST SURGICAL HISTORY: Past Surgical History:  Procedure Laterality Date  . ABDOMINAL HYSTERECTOMY  2010  . BREAST BIOPSY     right, 1996  . ECTOPIC PREGNANCY SURGERY    . SHOULDER OPEN ROTATOR CUFF REPAIR  2017   right  . TUBAL LIGATION     on right  . TYMPANOSTOMY TUBE PLACEMENT      SOCIAL HISTORY: Social History  Substance Use Topics  . Smoking status: Never Smoker  . Smokeless tobacco: Never Used  . Alcohol use No    FAMILY HISTORY: Family History  Problem Relation Age of Onset  . Heart disease Mother     CAD w/MI in mid 31s  . Diabetes Mother   . Depression Mother   . Hypertension Mother   . Hyperlipidemia Mother   . Liver disease Mother   . Sleep apnea Mother   . Obesity Mother   . Diabetes Father   . Cancer Maternal Grandmother     throat  . Heart disease Maternal Grandmother   . Cancer Paternal Grandmother     lung  . Heart disease Paternal Grandmother   . Hypertension Sister     as a teenager    ROS: Review of Systems  Constitutional: Positive for malaise/fatigue.       Trouble sleeping  HENT: Positive for hearing loss.   Eyes:       Vision changes Wear glasses or contacts Floaters  Respiratory: Positive for shortness of breath (with activity).   Cardiovascular:       Leg cramping  Gastrointestinal: Positive for heartburn.  Genitourinary: Positive for frequency.  Skin: Positive for itching.       Dryness    Endo/Heme/Allergies: Positive for polydipsia.       Heat/cold intolerance   Psychiatric/Behavioral:       Stress  All other systems reviewed and are negative.   PHYSICAL EXAM: Blood pressure 133/83, pulse 80, temperature 98.6 F (37 C), temperature source Oral, resp. rate 14, height 5\' 3"  (1.6 m), weight 213 lb (96.6 kg), SpO2 97 %. Body mass index is 37.73 kg/m. Physical Exam  Constitutional: She is oriented to person, place, and time. She appears well-developed and well-nourished.  HENT:  Head: Normocephalic and atraumatic.  Eyes: EOM are normal. Pupils are equal,  round, and reactive to light.  Neck: Normal range of motion. Neck supple.  Cardiovascular: Normal rate and regular rhythm.   Pulmonary/Chest: Effort normal.  Abdominal: Soft. Bowel sounds are normal.  Musculoskeletal: Normal range of motion.  Neurological: She is alert and oriented to person, place, and time.  Skin: Skin is warm and dry.  Psychiatric: She has a normal mood and affect. Her behavior is normal.  Vitals reviewed.   RECENT LABS AND TESTS: BMET    Component Value Date/Time   NA 136 06/28/2016 1233   K 3.7 06/28/2016 1233   CL 102 06/28/2016 1233   CO2 30 06/28/2016 1233   GLUCOSE 97 06/28/2016 1233   BUN 13 06/28/2016 1233   CREATININE 0.61 06/28/2016 1233   CALCIUM 9.4 06/28/2016 1233   Lab Results  Component Value Date   HGBA1C 5.6 07/05/2016   No results found for: INSULIN CBC    Component Value Date/Time   WBC 8.0 06/28/2016 1233   RBC 4.05 06/28/2016 1233   HGB 11.8 (L) 06/28/2016 1233   HCT 34.7 (L) 06/28/2016 1233   PLT 272.0 06/28/2016 1233   MCV 85.7 06/28/2016 1233   MCHC 34.1 06/28/2016 1233   RDW 14.6 06/28/2016 1233   Iron/TIBC/Ferritin/ %Sat No results found for: IRON, TIBC, FERRITIN, IRONPCTSAT Lipid Panel     Component Value Date/Time   CHOL 219 (H) 06/28/2016 1233   TRIG 125.0 06/28/2016 1233   HDL 76.00 06/28/2016 1233   CHOLHDL 3 06/28/2016 1233   VLDL  25.0 06/28/2016 1233   LDLCALC 118 (H) 06/28/2016 1233   Hepatic Function Panel     Component Value Date/Time   PROT 7.1 06/28/2016 1233   ALBUMIN 3.9 06/28/2016 1233   AST 15 06/28/2016 1233   ALT 17 06/28/2016 1233   ALKPHOS 42 06/28/2016 1233   BILITOT 0.3 06/28/2016 1233      Component Value Date/Time   TSH 2.54 06/28/2016 1233   TSH 1.64 03/26/2016 1617   TSH 2.67 11/11/2014 1237    ECG  shows NSR with a rate of 66. INDIRECT CALORIMETER done today shows a VO2 of 274 and a REE of 1907.    ASSESSMENT AND PLAN: Other fatigue - Plan: Vitamin B12, CBC With Differential, Comprehensive metabolic panel, Folate, Hemoglobin A1c, Insulin, random, Lipid Panel With LDL/HDL Ratio, T3, T4, free, VITAMIN D 25 Hydroxy (Vit-D Deficiency, Fractures), TSH  Shortness of breath on exertion  Diastolic dysfunction - Grade I  Depression screening  At risk for heart disease  Class 2 obesity without serious comorbidity with body mass index (BMI) of 37.0 to 37.9 in adult, unspecified obesity type  PLAN:  Fatigue Keana was informed that her fatigue may be related to obesity, depression or many other causes. Labs will be ordered, and in the meanwhile Mykisha has agreed to work on diet, exercise and weight loss to help with fatigue. Proper sleep hygiene was discussed including the need for 7-8 hours of quality sleep each night. A sleep study was not ordered based on symptoms and Epworth score.  Dyspnea on exertion Milika's shortness of breath appears to be obesity related and exercise induced. She has agreed to work on weight loss and gradually increase exercise to treat her exercise induced shortness of breath. If Tephanie follows our instructions and loses weight without improvement of her shortness of breath, we will plan to refer to pulmonology. We will monitor this condition regularly. Karenza agrees to this plan.  Cardiovascular risk counselling Charlin was given extended  (  at least 15 minutes) coronary artery disease prevention counseling today. She is 46 y.o. female and has risk factors for heart disease including obesity. We discussed intensive lifestyle modifications today with an emphasis on specific weight loss instructions and strategies. Pt was also informed of the importance of increasing exercise and decreasing saturated fats to help prevent heart disease.  Depression Screen Keyonte had a positive depression screening. Depression is commonly associated with obesity and often results in emotional eating behaviors. We will monitor this closely and work on CBT to help improve the non-hunger eating patterns. Referral to Psychology may be required if no improvement is seen as she continues in our clinic.  Obesity Makiko is currently in the action stage of change and her goal is to continue with weight loss efforts She has agreed to follow the Category 2 plan +100. Derika has been instructed to work up to a goal of 150 minutes of combined cardio and strengthening exercise per week for weight loss and overall health benefits. We discussed the following Behavioral Modification Stratagies today: increasing lean protein intake, decreasing simple carbohydrates , increasing vegetables and dealing with family or coworker sabotage  Leyona has agreed to follow up with our clinic in 2 weeks. She was informed of the importance of frequent follow up visits to maximize her success with intensive lifestyle modifications for her multiple health conditions. She was informed we would discuss her lab results at her next visit unless there is a critical issue that needs to be addressed sooner. Ellouise agreed to keep her next visit at the agreed upon time to discuss these results.  I, April Moore , am acting as Education administrator for Dennard Nip, MD  I have reviewed the above documentation for accuracy and completeness, and I agree with the above. -Dennard Nip,  MD

## 2016-07-13 LAB — TSH: TSH: 1.34 u[IU]/mL (ref 0.450–4.500)

## 2016-07-13 LAB — LIPID PANEL WITH LDL/HDL RATIO
CHOLESTEROL TOTAL: 226 mg/dL — AB (ref 100–199)
HDL: 73 mg/dL (ref >39)
LDL Calculated: 134 mg/dL — ABNORMAL HIGH (ref 0–99)
LDL/HDL RATIO: 1.8 ratio (ref 0.0–3.2)
TRIGLYCERIDES: 93 mg/dL (ref 0–149)
VLDL Cholesterol Cal: 19 mg/dL (ref 5–40)

## 2016-07-13 LAB — COMPREHENSIVE METABOLIC PANEL
A/G RATIO: 1.4 (ref 1.2–2.2)
ALT: 17 IU/L (ref 0–32)
AST: 19 IU/L (ref 0–40)
Albumin: 4.4 g/dL (ref 3.5–5.5)
Alkaline Phosphatase: 59 IU/L (ref 39–117)
BUN/Creatinine Ratio: 23 (ref 9–23)
BUN: 14 mg/dL (ref 6–24)
Bilirubin Total: 0.3 mg/dL (ref 0.0–1.2)
CALCIUM: 9.6 mg/dL (ref 8.7–10.2)
CO2: 27 mmol/L (ref 18–29)
CREATININE: 0.61 mg/dL (ref 0.57–1.00)
Chloride: 95 mmol/L — ABNORMAL LOW (ref 96–106)
GFR calc Af Amer: 127 mL/min/{1.73_m2} (ref >59)
GFR, EST NON AFRICAN AMERICAN: 110 mL/min/{1.73_m2} (ref >59)
GLOBULIN, TOTAL: 3.1 g/dL (ref 1.5–4.5)
Glucose: 88 mg/dL (ref 65–99)
POTASSIUM: 4 mmol/L (ref 3.5–5.2)
Sodium: 140 mmol/L (ref 134–144)
TOTAL PROTEIN: 7.5 g/dL (ref 6.0–8.5)

## 2016-07-13 LAB — HEMOGLOBIN A1C
ESTIMATED AVERAGE GLUCOSE: 100 mg/dL
HEMOGLOBIN A1C: 5.1 % (ref 4.8–5.6)

## 2016-07-13 LAB — CBC WITH DIFFERENTIAL
BASOS: 0 %
Basophils Absolute: 0 10*3/uL (ref 0.0–0.2)
EOS (ABSOLUTE): 0.2 10*3/uL (ref 0.0–0.4)
Eos: 2 %
HEMATOCRIT: 37.7 % (ref 34.0–46.6)
Hemoglobin: 13.1 g/dL (ref 11.1–15.9)
IMMATURE GRANULOCYTES: 0 %
Immature Grans (Abs): 0 10*3/uL (ref 0.0–0.1)
LYMPHS: 33 %
Lymphocytes Absolute: 2.1 10*3/uL (ref 0.7–3.1)
MCH: 29 pg (ref 26.6–33.0)
MCHC: 34.7 g/dL (ref 31.5–35.7)
MCV: 83 fL (ref 79–97)
Monocytes Absolute: 0.3 10*3/uL (ref 0.1–0.9)
Monocytes: 5 %
NEUTROS PCT: 60 %
Neutrophils Absolute: 3.9 10*3/uL (ref 1.4–7.0)
RBC: 4.52 x10E6/uL (ref 3.77–5.28)
RDW: 14.5 % (ref 12.3–15.4)
WBC: 6.4 10*3/uL (ref 3.4–10.8)

## 2016-07-13 LAB — T4, FREE: Free T4: 1.13 ng/dL (ref 0.82–1.77)

## 2016-07-13 LAB — VITAMIN B12: VITAMIN B 12: 468 pg/mL (ref 232–1245)

## 2016-07-13 LAB — FOLATE: Folate: 17.3 ng/mL (ref >3.0)

## 2016-07-13 LAB — INSULIN, RANDOM: INSULIN: 19.1 u[IU]/mL (ref 2.6–24.9)

## 2016-07-13 LAB — T3: T3, Total: 121 ng/dL (ref 71–180)

## 2016-07-13 LAB — VITAMIN D 25 HYDROXY (VIT D DEFICIENCY, FRACTURES): VIT D 25 HYDROXY: 46.1 ng/mL (ref 30.0–100.0)

## 2016-07-17 ENCOUNTER — Encounter (INDEPENDENT_AMBULATORY_CARE_PROVIDER_SITE_OTHER): Payer: Self-pay | Admitting: Family Medicine

## 2016-07-17 ENCOUNTER — Ambulatory Visit (INDEPENDENT_AMBULATORY_CARE_PROVIDER_SITE_OTHER): Payer: Federal, State, Local not specified - PPO | Admitting: Cardiology

## 2016-07-17 ENCOUNTER — Encounter: Payer: Self-pay | Admitting: Cardiology

## 2016-07-17 VITALS — BP 118/86 | HR 70 | Ht 63.0 in | Wt 218.0 lb

## 2016-07-17 DIAGNOSIS — R0602 Shortness of breath: Secondary | ICD-10-CM | POA: Diagnosis not present

## 2016-07-17 DIAGNOSIS — R079 Chest pain, unspecified: Secondary | ICD-10-CM | POA: Diagnosis not present

## 2016-07-17 NOTE — Patient Instructions (Addendum)
Medication Instructions:  Your physician recommends that you continue on your current medications as directed. Please refer to the Current Medication list given to you today.   Labwork: None Ordered   Testing/Procedures: None Ordered   Follow-Up: Follow-up with Dr. Curt Bears as needed  Any Other Special Instructions Will Be Listed Below (If Applicable).

## 2016-07-17 NOTE — Progress Notes (Signed)
Electrophysiology Office Note   Date:  07/17/2016   ID:  Julie Bishop, DOB 1971/03/04, MRN [id number]  PCP:  Penni Homans, MD Primary Electrophysiologist:  Alexsandria Kivett Meredith Leeds, MD    Chief Complaint  Patient presents with  . New Patient (Initial Visit)    Chest pain/SOB     History of Present Illness: Julie Bishop is a 46 y.o. female who presents today for electrophysiology evaluation.   She has a history of hypertension who presents today with complaints of chest pain and shortness of breath. She says that over the last few months she's been getting twinges in her chest. They mainly occur when she is at rest and have not occurred when she is exerting herself. She says that the 20's last a few seconds at a time and occur multiple times in a week. They do not radiate, and are not associated with shortness of breath or diaphoresis. She also has episodes of shortness of breath. She has had 2 episodes she can remember. One episode occurred when she was singing, and another episode occurred when she was walking in her office to the printer and back. She has done these activities since the episodes of shortness of breath and has not had any further issues.   Today, she denies symptoms of palpitations, chest pain, shortness of breath, orthopnea, PND, lower extremity edema, claudication, dizziness, presyncope, syncope, bleeding, or neurologic sequela. The patient is tolerating medications without difficulties and is otherwise without complaint today.    Past Medical History:  Diagnosis Date  . Adjustment disorder   . Anemia   . Anxiety   . Back pain   . Cancer ([place]) 2008   skin cancer, BCC  . Chest discomfort    at times  . Constipation 11/14/2014  . Depression   . Edema    feet and legs  . Generalized anxiety disorder   . GERD (gastroesophageal reflux disease)   . Headache 08/24/2015  . Heart valve problem    trivial leak in mitral valve    trivial leak in tricuspid   . Hyperlipidemia    Elevated, but never taken medication  . Hypertension   . Insomnia 08/24/2015  . Joint pain   . Obesity 11/14/2014  . Pain in joint, shoulder region 11/27/2015  . Palpitations   . Post-menopause 08/23/2015  . Preventative health care 08/23/2015  . Raynauds syndrome   . Sleep apnea    appt with pulmonary Dr in march 2018  . SOB (shortness of breath)   . Vitamin B12 deficiency 08/23/2015  . Vitamin D deficiency 08/23/2015   Past Surgical History:  Procedure Laterality Date  . ABDOMINAL HYSTERECTOMY  2010  . BREAST BIOPSY     right, 1996  . ECTOPIC PREGNANCY SURGERY    . SHOULDER OPEN ROTATOR CUFF REPAIR  2017   right  . TUBAL LIGATION     on right  . TYMPANOSTOMY TUBE PLACEMENT       Current Outpatient Prescriptions  Medication Sig Dispense Refill  . amLODipine (NORVASC) 5 MG tablet Take 1 tablet (5 mg total) by mouth daily. 30 tablet 1  . Cholecalciferol (VITAMIN D PO) Take 5,000 Units by mouth daily. Take one 4,000 IU po qd.     . DULoxetine (CYMBALTA) 60 MG capsule Take 1 capsule by mouth daily.    . hydrochlorothiazide (HYDRODIURIL) 25 MG tablet Take 1 tablet (25 mg total) by mouth daily. 90 tablet 3  . KRILL OIL PO Take 1  capsule by mouth every morning.    Marland Kitchen LORazepam (ATIVAN) 0.5 MG tablet Take 0.5 mg by mouth daily.     . metoprolol succinate (TOPROL-XL) 50 MG 24 hr tablet Take 1 tablet (50 mg total) by mouth daily. Take with or immediately following a meal. 30 tablet 5  . Vitamin D, Ergocalciferol, (DRISDOL) 50000 units CAPS capsule Take 1 capsule (50,000 Units total) by mouth every 7 (seven) days. 4 capsule 4   No current facility-administered medications for this visit.     Allergies:   Oxycodone and Losartan   Social History:  The patient  reports that she has never smoked. She has never used smokeless tobacco. She reports that she does not drink alcohol or use drugs.   Family History:  The patient's family history includes Cancer in her maternal  grandmother and paternal grandmother; Depression in her mother; Diabetes in her father and mother; Heart disease in her maternal grandmother, mother, and paternal grandmother; Hyperlipidemia in her mother; Hypertension in her mother and sister; Liver disease in her mother; Obesity in her mother; Sleep apnea in her mother.    ROS:  Please see the history of present illness.   Otherwise, review of systems is positive for Leg swelling, dyspnea on exertion, snoring, anxiety, depression, headaches.   All other systems are reviewed and negative.    PHYSICAL EXAM: VS:  BP 118/86   Pulse 70   Ht 5\' 3"  (1.6 m)   Wt 218 lb (98.9 kg)   BMI 38.62 kg/m  , BMI Body mass index is 38.62 kg/m. GEN: Well nourished, well developed, in no acute distress  HEENT: normal  Neck: no JVD, carotid bruits, or masses Cardiac: RRR; no murmurs, rubs, or gallops,no edema  Respiratory:  clear to auscultation bilaterally, normal work of breathing GI: soft, nontender, nondistended, + BS MS: no deformity or atrophy  Skin: warm and dry, Neuro:  Strength and sensation are intact Psych: euthymic mood, full affect  EKG:  EKG is ordered today. Personal review of the ekg ordered shows sinus rhythm, rate 70  Recent Labs: 06/28/2016: Hemoglobin 11.8; Platelets 272.0 07/12/2016: ALT 17; BUN 14; Creatinine, Ser 0.61; Potassium 4.0; Sodium 140; TSH 1.340    Lipid Panel     Component Value Date/Time   CHOL 226 (H) 07/12/2016 1058   TRIG 93 07/12/2016 1058   HDL 73 07/12/2016 1058   CHOLHDL 3 06/28/2016 1233   VLDL 25.0 06/28/2016 1233   LDLCALC 134 (H) 07/12/2016 1058     Wt Readings from Last 3 Encounters:  07/17/16 218 lb (98.9 kg)  07/12/16 213 lb (96.6 kg)  07/05/16 224 lb 12.8 oz (102 kg)      Other studies Reviewed: Additional studies/ records that were reviewed today include: TTE 07/04/16  Review of the above records today demonstrates:  - Left ventricle: The cavity size was normal. Wall thickness was    normal. Systolic function was normal. The estimated ejection   fraction was in the range of 50% to 55%. Normal GLPSS at -20%.   Wall motion was normal; there were no regional wall motion   abnormalities. Doppler parameters are consistent with abnormal   left ventricular relaxation (grade 1 diastolic dysfunction). The   E/e&' ratio is between 8-15, suggesting indeterminate LV filling   pressure. - Mitral valve: Mildly thickened leaflets . There was trivial   regurgitation. - Left atrium: The atrium was normal in size. - Right ventricle: The cavity size was mildly dilated. Systolic  function was normal. - Tricuspid valve: There was trivial regurgitation. - Pulmonary arteries: PA peak pressure: 23 mm Hg (S). - Inferior vena cava: The vessel was normal in size. The   respirophasic diameter changes were in the normal range (>= 50%),   consistent with normal central venous pressure.   ASSESSMENT AND PLAN:  1.  Chest pain: She describes her chest pain as a twin sensation. The pain is not associated with exertion, and only last for a few seconds. Due to the character of her chest pain, it is unlikely to be caused by these. I have told her of the worrisome signs of coronary artery disease and chest pain, and she [place] call us back if she develops chest pain with exertion.  2. Shortness of breath: At this time is unclear as to the cause of her shortness of breath. It is encouraging that she is only had 2 episodes, and with similar activities has not had any further shortness of breath. Her echocardiogram shows a normal EF with grade 1 diastolic dysfunction. Is unlikely that her diastolic dysfunction is causing her shortness of breath at this time. Would continue to work to control her blood pressure, which is well-controlled today.  3. Hypertension: Well-controlled today on her current dosage of amlodipine, chlorothiazide, and metoprolol.   4. Snoring: Has a sleep study scheduled. Control of sleep  apnea, if present could potentially help with her blood pressure issues.    Current medicines are reviewed at length with the patient today.   The patient does not have concerns regarding her medicines.  The following changes were made today:  none  Labs/ tests ordered today include:  Orders Placed This Encounter  Procedures  . EKG 12-Lead     Disposition:   FU with Jamieson Hetland PRN  Signed, Jamirah Zelaya Meredith Leeds, MD  07/17/2016 3:55 PM     [place] [address] [place] [address] [phone] (office) [phone] (fax)

## 2016-07-25 ENCOUNTER — Telehealth: Payer: Self-pay | Admitting: Family Medicine

## 2016-07-25 ENCOUNTER — Other Ambulatory Visit: Payer: Self-pay | Admitting: Family Medicine

## 2016-07-25 ENCOUNTER — Encounter: Payer: Self-pay | Admitting: Family Medicine

## 2016-07-25 DIAGNOSIS — B373 Candidiasis of vulva and vagina: Secondary | ICD-10-CM

## 2016-07-25 DIAGNOSIS — B3731 Acute candidiasis of vulva and vagina: Secondary | ICD-10-CM

## 2016-07-25 MED ORDER — FLUCONAZOLE 150 MG PO TABS: 150.0000 mg | ORAL_TABLET | ORAL | 0 refills | Status: DC

## 2016-07-25 NOTE — Telephone Encounter (Signed)
Caller name: Relation to PO:[id number] Call back number:[phone] Pharmacy:[facility]  Reason for call:  Pt would like to know if dr. Charlett Blake can call her in a rx for diflucan states she thinks she has a yeast infection,pt is aware that she may have to be seen to be dx if Dr. Charlett Blake will not write the rx without being seen.

## 2016-07-25 NOTE — Telephone Encounter (Signed)
I have sent in Diflucan for her please let her know and let her know if no improvement then come in

## 2016-07-26 NOTE — Telephone Encounter (Signed)
Patient informed of PCP instructions. 

## 2016-07-31 ENCOUNTER — Ambulatory Visit (INDEPENDENT_AMBULATORY_CARE_PROVIDER_SITE_OTHER): Payer: Federal, State, Local not specified - PPO | Admitting: Family Medicine

## 2016-07-31 VITALS — BP 113/69 | HR 72 | Temp 98.6°F | Ht 63.0 in | Wt 206.0 lb

## 2016-07-31 DIAGNOSIS — E8881 Metabolic syndrome: Secondary | ICD-10-CM

## 2016-07-31 DIAGNOSIS — Z9189 Other specified personal risk factors, not elsewhere classified: Secondary | ICD-10-CM | POA: Diagnosis not present

## 2016-07-31 DIAGNOSIS — Z6836 Body mass index (BMI) 36.0-36.9, adult: Secondary | ICD-10-CM

## 2016-07-31 DIAGNOSIS — E559 Vitamin D deficiency, unspecified: Secondary | ICD-10-CM

## 2016-07-31 DIAGNOSIS — E669 Obesity, unspecified: Secondary | ICD-10-CM

## 2016-07-31 MED ORDER — METFORMIN HCL 500 MG PO TABS: 500.0000 mg | ORAL_TABLET | Freq: Every day | ORAL | 0 refills | Status: DC

## 2016-07-31 NOTE — Progress Notes (Signed)
Office: [phone]  /  Fax: [phone]   HPI:   Chief Complaint: OBESITY Julie Bishop is here to discuss her progress with her obesity treatment plan. She is following her eating plan approximately 90 % of the time and states she is exercising 0 minutes 0 times per week. Julie Bishop has done well with weight loss. She did deviate from plan some and had eaten out 2 to 3 times but she tried to make good choices. She likely did not eat enough protein. Her weight is 206 lb (93.4 kg) today and has had a weight loss of 7 pounds over a period of 2 to 3 weeks since her last visit. She has lost 7 lbs since starting treatment with [place].  Vitamin D deficiency Julie Bishop has a diagnosis of vitamin D deficiency. She is currently taking vit D  50,000 IU every week and 5,000 IU OTC daily for many months. She denies nausea, vomiting or muscle weakness.  Insulin Resistance Julie Bishop has a diagnosis of insulin resistance based on her elevated fasting insulin level >5. Although Julie Bishop's Hgb A1c is normal and  blood glucose readings are still under good control, insulin resistance puts her at greater risk of metabolic syndrome and diabetes. She has a strong family history of diabetes in both parents. She is not taking metformin currently and continues to work on diet and exercise to decrease risk of diabetes. She denies hypoglycemia.  At risk for diabetes Julie Bishop is at higher than average risk for developing diabetes due to her obesity. She currently denies polyuria or polydipsia.  Wt Readings from Last 500 Encounters:  07/31/16 206 lb (93.4 kg)  07/17/16 218 lb (98.9 kg)  07/12/16 213 lb (96.6 kg)  07/05/16 224 lb 12.8 oz (102 kg)  06/28/16 227 lb 3.2 oz (103.1 kg)  06/25/16 224 lb 3.2 oz (101.7 kg)  03/26/16 219 lb 8 oz (99.6 kg)  03/19/16 220 lb (99.8 kg)  11/15/15 211 lb 10 oz (96 kg)  11/03/15 211 lb (95.7 kg)  09/19/15 202 lb (91.6 kg)  08/23/15 195 lb (88.5 kg)  12/10/14 175 lb (79.4 kg)    12/08/14 179 lb 9.6 oz (81.5 kg)  11/11/14 176 lb 8 oz (80.1 kg)     ALLERGIES: Allergies  Allergen Reactions  . Oxycodone Itching  . Losartan Other (See Comments)    Diarrhea and abodminal cramping    MEDICATIONS: Current Outpatient Prescriptions on File Prior to Visit  Medication Sig Dispense Refill  . amLODipine (NORVASC) 5 MG tablet Take 1 tablet (5 mg total) by mouth daily. 30 tablet 1  . Cholecalciferol (VITAMIN D PO) Take 5,000 Units by mouth daily. Take one 4,000 IU po qd.     . DULoxetine (CYMBALTA) 60 MG capsule Take 1 capsule by mouth daily.    . fluconazole (DIFLUCAN) 150 MG tablet Take 1 tablet (150 mg total) by mouth once a week. 2 tablet 0  . hydrochlorothiazide (HYDRODIURIL) 25 MG tablet Take 1 tablet (25 mg total) by mouth daily. 90 tablet 3  . KRILL OIL PO Take 1 capsule by mouth every morning.    Marland Kitchen LORazepam (ATIVAN) 0.5 MG tablet Take 0.5 mg by mouth daily.     . metoprolol succinate (TOPROL-XL) 50 MG 24 hr tablet Take 1 tablet (50 mg total) by mouth daily. Take with or immediately following a meal. 30 tablet 5  . Vitamin D, Ergocalciferol, (DRISDOL) 50000 units CAPS capsule Take 1 capsule (50,000 Units total) by mouth every 7 (seven) days. 4  capsule 4   No current facility-administered medications on file prior to visit.     PAST MEDICAL HISTORY: Past Medical History:  Diagnosis Date  . Adjustment disorder   . Anemia   . Anxiety   . Back pain   . Cancer ([place]) 2008   skin cancer, BCC  . Chest discomfort    at times  . Constipation 11/14/2014  . Depression   . Edema    feet and legs  . Generalized anxiety disorder   . GERD (gastroesophageal reflux disease)   . Headache 08/24/2015  . Heart valve problem    trivial leak in mitral valve    trivial leak in tricuspid  . Hyperlipidemia    Elevated, but never taken medication  . Hypertension   . Insomnia 08/24/2015  . Joint pain   . Obesity 11/14/2014  . Pain in joint, shoulder region 11/27/2015  .  Palpitations   . Post-menopause 08/23/2015  . Preventative health care 08/23/2015  . Raynauds syndrome   . Sleep apnea    appt with pulmonary Dr in march 2018  . SOB (shortness of breath)   . Vitamin B12 deficiency 08/23/2015  . Vitamin D deficiency 08/23/2015    PAST SURGICAL HISTORY: Past Surgical History:  Procedure Laterality Date  . ABDOMINAL HYSTERECTOMY  2010  . BREAST BIOPSY     right, 1996  . ECTOPIC PREGNANCY SURGERY    . SHOULDER OPEN ROTATOR CUFF REPAIR  2017   right  . TUBAL LIGATION     on right  . TYMPANOSTOMY TUBE PLACEMENT      SOCIAL HISTORY: Social History  Substance Use Topics  . Smoking status: Never Smoker  . Smokeless tobacco: Never Used  . Alcohol use No    FAMILY HISTORY: Family History  Problem Relation Age of Onset  . Heart disease Mother     CAD w/MI in mid 67s  . Diabetes Mother   . Depression Mother   . Hypertension Mother   . Hyperlipidemia Mother   . Liver disease Mother   . Sleep apnea Mother   . Obesity Mother   . Diabetes Father   . Cancer Maternal Grandmother     throat  . Heart disease Maternal Grandmother   . Cancer Paternal Grandmother     lung  . Heart disease Paternal Grandmother   . Hypertension Sister     as a teenager    ROS: Review of Systems  Constitutional: Positive for weight loss.  Gastrointestinal: Negative for nausea and vomiting.  Genitourinary: Negative for frequency.  Musculoskeletal:       Negative muscle weakness  Endo/Heme/Allergies: Negative for polydipsia.    PHYSICAL EXAM: Blood pressure 113/69, pulse 72, temperature 98.6 F (37 C), temperature source Oral, height 5\' 3"  (1.6 m), weight 206 lb (93.4 kg), SpO2 98 %. Body mass index is 36.49 kg/m. Physical Exam  Constitutional: She is oriented to person, place, and time. She appears well-developed and well-nourished.  Cardiovascular: Normal rate.   Pulmonary/Chest: Effort normal.  Musculoskeletal: Normal range of motion.  Neurological: She  is oriented to person, place, and time.  Skin: Skin is warm and dry.  Psychiatric: She has a normal mood and affect. Her behavior is normal.  Vitals reviewed.   RECENT LABS AND TESTS: BMET    Component Value Date/Time   NA 140 07/12/2016 1058   K 4.0 07/12/2016 1058   CL 95 (L) 07/12/2016 1058   CO2 27 07/12/2016 1058   GLUCOSE 88 07/12/2016  1058   GLUCOSE 97 06/28/2016 1233   BUN 14 07/12/2016 1058   CREATININE 0.61 07/12/2016 1058   CALCIUM 9.6 07/12/2016 1058   GFRNONAA 110 07/12/2016 1058   GFRAA 127 07/12/2016 1058   Lab Results  Component Value Date   HGBA1C 5.1 07/12/2016   HGBA1C 5.6 07/05/2016   HGBA1C 5.2 08/23/2015   Lab Results  Component Value Date   INSULIN 19.1 07/12/2016   CBC    Component Value Date/Time   WBC 6.4 07/12/2016 1058   WBC 8.0 06/28/2016 1233   RBC 4.52 07/12/2016 1058   RBC 4.05 06/28/2016 1233   HGB 11.8 (L) 06/28/2016 1233   HCT 37.7 07/12/2016 1058   PLT 272.0 06/28/2016 1233   MCV 83 07/12/2016 1058   MCH 29.0 07/12/2016 1058   MCHC 34.7 07/12/2016 1058   MCHC 34.1 06/28/2016 1233   RDW 14.5 07/12/2016 1058   LYMPHSABS 2.1 07/12/2016 1058   EOSABS 0.2 07/12/2016 1058   BASOSABS 0.0 07/12/2016 1058   Iron/TIBC/Ferritin/ %Sat No results found for: IRON, TIBC, FERRITIN, IRONPCTSAT Lipid Panel     Component Value Date/Time   CHOL 226 (H) 07/12/2016 1058   TRIG 93 07/12/2016 1058   HDL 73 07/12/2016 1058   CHOLHDL 3 06/28/2016 1233   VLDL 25.0 06/28/2016 1233   LDLCALC 134 (H) 07/12/2016 1058   Hepatic Function Panel     Component Value Date/Time   PROT 7.5 07/12/2016 1058   ALBUMIN 4.4 07/12/2016 1058   AST 19 07/12/2016 1058   ALT 17 07/12/2016 1058   ALKPHOS 59 07/12/2016 1058   BILITOT 0.3 07/12/2016 1058      Component Value Date/Time   TSH 1.340 07/12/2016 1058   TSH 2.54 06/28/2016 1233   TSH 1.64 03/26/2016 1617    ASSESSMENT AND PLAN: Insulin resistance - Plan: metFORMIN (GLUCOPHAGE) 500 MG  tablet  Vitamin D deficiency  At risk for diabetes mellitus  Class 2 obesity without serious comorbidity with body mass index (BMI) of 36.0 to 36.9 in adult, unspecified obesity type  PLAN:  Vitamin D Deficiency Julie Bishop was informed that low vitamin D levels contributes to fatigue and are associated with obesity, breast, and colon cancer. She agrees to continue to take prescription Vit D @50 ,000 IU every week and OTC vitamin D as is and we will re-check labs in 3 months and follow closely and will follow up for routine testing of vitamin D, at least 2-3 times per year. She was informed of the risk of over-replacement of vitamin D and agrees to not increase her dose unless he discusses this with [place] first.  Insulin Resistance Julie Bishop will continue to work on weight loss, exercise, and decreasing simple carbohydrates in her diet to help decrease the risk of diabetes. We dicussed metformin including benefits and risks. She was informed that eating too many simple carbohydrates or too many calories at one sitting increases the likelihood of GI side effects. Julie Bishop requested metformin for now and prescription was written today for Metformin 500 mg every morning #30 with no refills . Julie Bishop agreed to follow up with [place] as directed to monitor her progress.  Diabetes risk counselling Julie Bishop was given extended (at least 30 minutes) diabetes prevention counseling today. She is 46 y.o. female and has risk factors for diabetes including obesity. We discussed intensive lifestyle modifications today with an emphasis on weight loss as well as increasing exercise and decreasing simple carbohydrates in her diet.  Obesity Julie Bishop is currently in the  action stage of change. As such, her goal is to continue with weight loss efforts She has agreed to change to keeping a food journal with 1200 to 1500 calories and 75+ grams of protein daily Julie Bishop has been instructed to work up to a goal of 150  minutes of combined cardio and strengthening exercise per week for weight loss and overall health benefits. We discussed the following Behavioral Modification Stratagies today: increasing lean protein intake, decreasing simple carbohydrates , increasing lower sugar fruits and work on meal planning and easy cooking plans  Julie Bishop has agreed to follow up with our clinic in 2 weeks. She was informed of the importance of frequent follow up visits to maximize her success with intensive lifestyle modifications for her multiple health conditions.  I, Doreene Nest, am acting as scribe for Dennard Nip, MD  I have reviewed the above documentation for accuracy and completeness, and I agree with the above. -Dennard Nip, MD

## 2016-08-07 ENCOUNTER — Ambulatory Visit: Payer: Federal, State, Local not specified - PPO | Admitting: Family Medicine

## 2016-08-14 ENCOUNTER — Ambulatory Visit (INDEPENDENT_AMBULATORY_CARE_PROVIDER_SITE_OTHER): Payer: Federal, State, Local not specified - PPO | Admitting: Family Medicine

## 2016-08-14 VITALS — BP 127/85 | HR 61 | Temp 98.8°F | Ht 63.0 in | Wt 206.0 lb

## 2016-08-14 DIAGNOSIS — Z6836 Body mass index (BMI) 36.0-36.9, adult: Secondary | ICD-10-CM

## 2016-08-14 DIAGNOSIS — E669 Obesity, unspecified: Secondary | ICD-10-CM | POA: Diagnosis not present

## 2016-08-14 DIAGNOSIS — Z9189 Other specified personal risk factors, not elsewhere classified: Secondary | ICD-10-CM

## 2016-08-14 DIAGNOSIS — E8881 Metabolic syndrome: Secondary | ICD-10-CM

## 2016-08-14 NOTE — Progress Notes (Signed)
Office: [phone]  /  Fax: [phone]   HPI:   Chief Complaint: OBESITY Dim is here to discuss her progress with her obesity treatment plan. She is following her eating plan approximately 95 % of the time and states she is exercising 0 minutes 0 times per week. Jaclyn noted some celebration eating and then got off track aftrewards. She did a good job maintaining her weight loss but is retaining fluid. Her weight is 206 lb (93.4 kg) today and has maintained her weight over a period of 2 weeks since her last visit. She has lost 7 lbs since starting treatment with [place].  Insulin Resistance Sanda has a diagnosis of insulin resistance based on her elevated fasting insulin level >5. Although Duha's blood glucose readings are still under good control, insulin resistance puts her at greater risk of metabolic syndrome and diabetes. She started Metformin and had GI upset so she stopped. She is still working on diet and weight loss.   Wt Readings from Last 500 Encounters:  08/14/16 206 lb (93.4 kg)  07/31/16 206 lb (93.4 kg)  07/17/16 218 lb (98.9 kg)  07/12/16 213 lb (96.6 kg)  07/05/16 224 lb 12.8 oz (102 kg)  06/28/16 227 lb 3.2 oz (103.1 kg)  06/25/16 224 lb 3.2 oz (101.7 kg)  03/26/16 219 lb 8 oz (99.6 kg)  03/19/16 220 lb (99.8 kg)  11/15/15 211 lb 10 oz (96 kg)  11/03/15 211 lb (95.7 kg)  09/19/15 202 lb (91.6 kg)  08/23/15 195 lb (88.5 kg)  12/10/14 175 lb (79.4 kg)  12/08/14 179 lb 9.6 oz (81.5 kg)  11/11/14 176 lb 8 oz (80.1 kg)     ALLERGIES: Allergies  Allergen Reactions  . Oxycodone Itching  . Losartan Other (See Comments)    Diarrhea and abodminal cramping    MEDICATIONS: Current Outpatient Prescriptions on File Prior to Visit  Medication Sig Dispense Refill  . amLODipine (NORVASC) 5 MG tablet Take 1 tablet (5 mg total) by mouth daily. 30 tablet 1  . Cholecalciferol (VITAMIN D PO) Take 5,000 Units by mouth daily. Take one 4,000 IU po qd.     .  DULoxetine (CYMBALTA) 60 MG capsule Take 1 capsule by mouth daily.    . hydrochlorothiazide (HYDRODIURIL) 25 MG tablet Take 1 tablet (25 mg total) by mouth daily. 90 tablet 3  . KRILL OIL PO Take 1 capsule by mouth every morning.    Marland Kitchen LORazepam (ATIVAN) 0.5 MG tablet Take 0.5 mg by mouth daily.     . metFORMIN (GLUCOPHAGE) 500 MG tablet Take 1 tablet (500 mg total) by mouth daily with breakfast. (Patient taking differently: Take 500 mg by mouth daily with breakfast. Take 1/2 tab daily) 30 tablet 0  . metoprolol succinate (TOPROL-XL) 50 MG 24 hr tablet Take 1 tablet (50 mg total) by mouth daily. Take with or immediately following a meal. 30 tablet 5  . Vitamin D, Ergocalciferol, (DRISDOL) 50000 units CAPS capsule Take 1 capsule (50,000 Units total) by mouth every 7 (seven) days. 4 capsule 4   No current facility-administered medications on file prior to visit.     PAST MEDICAL HISTORY: Past Medical History:  Diagnosis Date  . Adjustment disorder   . Anemia   . Anxiety   . Back pain   . Cancer ([place]) 2008   skin cancer, BCC  . Chest discomfort    at times  . Constipation 11/14/2014  . Depression   . Edema    feet and  legs  . Generalized anxiety disorder   . GERD (gastroesophageal reflux disease)   . Headache 08/24/2015  . Heart valve problem    trivial leak in mitral valve    trivial leak in tricuspid  . Hyperlipidemia    Elevated, but never taken medication  . Hypertension   . Insomnia 08/24/2015  . Joint pain   . Obesity 11/14/2014  . Pain in joint, shoulder region 11/27/2015  . Palpitations   . Post-menopause 08/23/2015  . Preventative health care 08/23/2015  . Raynauds syndrome   . Sleep apnea    appt with pulmonary Dr in march 2018  . SOB (shortness of breath)   . Vitamin B12 deficiency 08/23/2015  . Vitamin D deficiency 08/23/2015    PAST SURGICAL HISTORY: Past Surgical History:  Procedure Laterality Date  . ABDOMINAL HYSTERECTOMY  2010  . BREAST BIOPSY     right, 1996    . ECTOPIC PREGNANCY SURGERY    . SHOULDER OPEN ROTATOR CUFF REPAIR  2017   right  . TUBAL LIGATION     on right  . TYMPANOSTOMY TUBE PLACEMENT      SOCIAL HISTORY: Social History  Substance Use Topics  . Smoking status: Never Smoker  . Smokeless tobacco: Never Used  . Alcohol use No    FAMILY HISTORY: Family History  Problem Relation Age of Onset  . Heart disease Mother     CAD w/MI in mid 65s  . Diabetes Mother   . Depression Mother   . Hypertension Mother   . Hyperlipidemia Mother   . Liver disease Mother   . Sleep apnea Mother   . Obesity Mother   . Diabetes Father   . Cancer Maternal Grandmother     throat  . Heart disease Maternal Grandmother   . Cancer Paternal Grandmother     lung  . Heart disease Paternal Grandmother   . Hypertension Sister     as a teenager    ROS: Review of Systems  Constitutional: Negative for weight loss.  Gastrointestinal: Positive for diarrhea and nausea.    PHYSICAL EXAM: Blood pressure 127/85, pulse 61, temperature 98.8 F (37.1 C), temperature source Oral, height 5\' 3"  (1.6 m), weight 206 lb (93.4 kg), SpO2 98 %. Body mass index is 36.49 kg/m. Physical Exam  Constitutional: She is oriented to person, place, and time. She appears well-developed and well-nourished.  Cardiovascular: Normal rate.   Pulmonary/Chest: Effort normal.  Musculoskeletal: Normal range of motion.  Neurological: She is oriented to person, place, and time.  Skin: Skin is warm and dry.  Psychiatric: She has a normal mood and affect. Her behavior is normal.  Vitals reviewed.   RECENT LABS AND TESTS: BMET    Component Value Date/Time   NA 140 07/12/2016 1058   K 4.0 07/12/2016 1058   CL 95 (L) 07/12/2016 1058   CO2 27 07/12/2016 1058   GLUCOSE 88 07/12/2016 1058   GLUCOSE 97 06/28/2016 1233   BUN 14 07/12/2016 1058   CREATININE 0.61 07/12/2016 1058   CALCIUM 9.6 07/12/2016 1058   GFRNONAA 110 07/12/2016 1058   GFRAA 127 07/12/2016 1058    Lab Results  Component Value Date   HGBA1C 5.1 07/12/2016   HGBA1C 5.6 07/05/2016   HGBA1C 5.2 08/23/2015   Lab Results  Component Value Date   INSULIN 19.1 07/12/2016   CBC    Component Value Date/Time   WBC 6.4 07/12/2016 1058   WBC 8.0 06/28/2016 1233   RBC 4.52 07/12/2016  1058   RBC 4.05 06/28/2016 1233   HGB 11.8 (L) 06/28/2016 1233   HCT 37.7 07/12/2016 1058   PLT 272.0 06/28/2016 1233   MCV 83 07/12/2016 1058   MCH 29.0 07/12/2016 1058   MCHC 34.7 07/12/2016 1058   MCHC 34.1 06/28/2016 1233   RDW 14.5 07/12/2016 1058   LYMPHSABS 2.1 07/12/2016 1058   EOSABS 0.2 07/12/2016 1058   BASOSABS 0.0 07/12/2016 1058   Iron/TIBC/Ferritin/ %Sat No results found for: IRON, TIBC, FERRITIN, IRONPCTSAT Lipid Panel     Component Value Date/Time   CHOL 226 (H) 07/12/2016 1058   TRIG 93 07/12/2016 1058   HDL 73 07/12/2016 1058   CHOLHDL 3 06/28/2016 1233   VLDL 25.0 06/28/2016 1233   LDLCALC 134 (H) 07/12/2016 1058   Hepatic Function Panel     Component Value Date/Time   PROT 7.5 07/12/2016 1058   ALBUMIN 4.4 07/12/2016 1058   AST 19 07/12/2016 1058   ALT 17 07/12/2016 1058   ALKPHOS 59 07/12/2016 1058   BILITOT 0.3 07/12/2016 1058      Component Value Date/Time   TSH 1.340 07/12/2016 1058   TSH 2.54 06/28/2016 1233   TSH 1.64 03/26/2016 1617    ASSESSMENT AND PLAN: Insulin resistance  Class 2 obesity without serious comorbidity with body mass index (BMI) of 36.0 to 36.9 in adult, unspecified obesity type  PLAN:  Insulin Resistance Davida will continue to work on weight loss, exercise, and decreasing simple carbohydrates in her diet to help decrease the risk of diabetes. We dicussed metformin including benefits and risks. She was informed that eating too many simple carbohydrates or too many calories at one sitting increases the likelihood of GI side effects. Carolie agrees to change to Metformin 1/2 pill every night with no refills. Brilee agreed to  follow up with [place] as directed to monitor her progress.  Diabetes risk counselling Juli was given extended (at least 15 minutes) diabetes prevention counseling today. She is 46 y.o. female and has risk factors for diabetes including obesity. We discussed intensive lifestyle modifications today with an emphasis on weight loss as well as increasing exercise and decreasing simple carbohydrates in her diet.  Obesity Ahliyah is currently in the action stage of change. As such, her goal is to continue with weight loss efforts She has agreed to keep a food journal with 1200 to 1500 calories and 80+ grams of protein daily Siboney has been instructed to work up to a goal of 150 minutes of combined cardio and strengthening exercise per week for weight loss and overall health benefits. We discussed the following Behavioral Modification Stratagies today: no skipping meals, increasing lean protein intake, increasing lower sugar fruits and increasing fiber rich foods  Niaja has agreed to follow up with our clinic in 2 weeks. She was informed of the importance of frequent follow up visits to maximize her success with intensive lifestyle modifications for her multiple health conditions.  I, Doreene Nest, am acting as scribe for Dennard Nip, MD  I have reviewed the above documentation for accuracy and completeness, and I agree with the above. -Dennard Nip, MD

## 2016-08-20 ENCOUNTER — Ambulatory Visit (INDEPENDENT_AMBULATORY_CARE_PROVIDER_SITE_OTHER): Payer: Federal, State, Local not specified - PPO | Admitting: Pulmonary Disease

## 2016-08-20 ENCOUNTER — Encounter: Payer: Self-pay | Admitting: Pulmonary Disease

## 2016-08-20 VITALS — BP 118/82 | HR 67 | Ht 63.0 in | Wt 205.8 lb

## 2016-08-20 DIAGNOSIS — G4733 Obstructive sleep apnea (adult) (pediatric): Secondary | ICD-10-CM

## 2016-08-20 NOTE — Progress Notes (Signed)
Past Surgical History She  has a past surgical history that includes Abdominal hysterectomy (2010); Ectopic pregnancy surgery; Breast biopsy; Tubal ligation; Shoulder open rotator cuff repair (2017); and Tympanostomy tube placement.  Allergies  Allergen Reactions  . Oxycodone Itching  . Losartan Other (See Comments)    Diarrhea and abodminal cramping    Family History Her family history includes Cancer in her maternal grandmother and paternal grandmother; Depression in her mother; Diabetes in her father and mother; Heart disease in her maternal grandmother, mother, and paternal grandmother; Hyperlipidemia in her mother; Hypertension in her mother and sister; Liver disease in her mother; Obesity in her mother; Sleep apnea in her mother.  Social History She  reports that she has never smoked. She has never used smokeless tobacco. She reports that she does not drink alcohol or use drugs.  Review of systems Constitutional: Negative for fever and unexpected weight change.  HENT: Negative for congestion, dental problem, ear pain, nosebleeds, postnasal drip, rhinorrhea, sinus pressure, sneezing, sore throat and trouble swallowing.   Eyes: Negative for redness and itching.  Respiratory: Positive for shortness of breath. Negative for cough, chest tightness and wheezing.   Cardiovascular: Negative for palpitations and leg swelling.  Gastrointestinal: Negative for nausea and vomiting.  Genitourinary: Negative for dysuria.  Musculoskeletal: Positive for joint swelling.  Skin: Negative for rash.  Neurological: Positive for headaches.  Hematological: Does not bruise/bleed easily.  Psychiatric/Behavioral: Positive for dysphoric mood. The patient is nervous/anxious.     Current Outpatient Prescriptions on File Prior to Visit  Medication Sig  . amLODipine (NORVASC) 5 MG tablet Take 1 tablet (5 mg total) by mouth daily.  . Cholecalciferol (VITAMIN D PO) Take 5,000 Units by mouth daily. Take one 4,000  IU po qd.   . DULoxetine (CYMBALTA) 60 MG capsule Take 1 capsule by mouth daily.  . hydrochlorothiazide (HYDRODIURIL) 25 MG tablet Take 1 tablet (25 mg total) by mouth daily.  Marland Kitchen KRILL OIL PO Take 1 capsule by mouth every morning.  Marland Kitchen LORazepam (ATIVAN) 0.5 MG tablet Take 0.5 mg by mouth daily.   . metFORMIN (GLUCOPHAGE) 500 MG tablet Take 1 tablet (500 mg total) by mouth daily with breakfast. (Patient taking differently: Take 250 mg by mouth daily with breakfast. Take 1/2 tab daily)  . metoprolol succinate (TOPROL-XL) 50 MG 24 hr tablet Take 1 tablet (50 mg total) by mouth daily. Take with or immediately following a meal.  . Vitamin D, Ergocalciferol, (DRISDOL) 50000 units CAPS capsule Take 1 capsule (50,000 Units total) by mouth every 7 (seven) days.   No current facility-administered medications on file prior to visit.     Chief Complaint  Patient presents with  . SLEEP CONSULT    Referred by Dr Randel Pigg. Sleep study 10+ years ago in [facility]. Epworth Score: 7    Cardiac tests Echo 07/04/16 >> EF 50 to 55%, grade 1 DD, PAS 23 mmHg  Past medical history She  has a past medical history of Adjustment disorder; Anemia; Anxiety; Back pain; Cancer [facility]) (2008); Chest discomfort; Constipation (11/14/2014); Depression; Edema; Generalized anxiety disorder; GERD (gastroesophageal reflux disease); Headache (08/24/2015); Heart valve problem; Hyperlipidemia; Hypertension; Insomnia (08/24/2015); Joint pain; Obesity (11/14/2014); Pain in joint, shoulder region (11/27/2015); Palpitations; Post-menopause (08/23/2015); Preventative health care (08/23/2015); Raynauds syndrome; Sleep apnea; SOB (shortness of breath); Vitamin B12 deficiency (08/23/2015); and Vitamin D deficiency (08/23/2015).  Vital signs BP 118/82 (BP Location: Left Arm, Cuff Size: Normal)   Pulse 67   Ht 5\' 3"  (1.6 m)  Wt 205 lb 12.8 oz (93.4 kg)   SpO2 98%   BMI 36.46 kg/m   History of Present Illness Julie Bishop is a 46 y.o. female  for evaluation of sleep problems.  She had sleep study about 10 yrs ago while living in [place].  She was found to have sleep apnea.  She started a monitored diet and lost about 75 lbs.  Her sleep improved.  Unfortunately, she regained the weight and her sleep got worse.  She has to sleep in separate room from her husband due to her snoring.  She also stops breathing while asleep.  Her mouth gets dry at night, and she wakes up frequently during dreams.  She was getting headaches at night and was found to have high blood pressure.  She has trouble staying awake when reading.  She goes to sleep at 9 pm.  She falls asleep after 10 minutes.  She wakes up some times to use the bathroom.  She gets out of bed at 7 am.  She feels tired in the morning.  She does not use anything to help her fall sleep.  She drinks 1 cup of coffee in the morning.  She denies sleep walking, sleep talking, bruxism, or nightmares.  There is no history of restless legs.  She denies sleep hallucinations, sleep paralysis, or cataplexy.  The Epworth score is 7 out of 24.   Physical Exam:  General - No distress Eyes - wears glasses ENT - No sinus tenderness, no oral exudate, no LAN, no thyromegaly, TM clear, pupils equal/reactive, MP 3, poor dentition Cardiac - s1s2 regular, no murmur, pulses symmetric Chest - No wheeze/rales/dullness, good air entry, normal respiratory excursion Back - No focal tenderness Abd - Soft, non-tender, no organomegaly, + bowel sounds Ext - No edema Neuro - Normal strength, cranial nerves intact Skin - No rashes Psych - Normal mood, and behavior  Discussion: She has snoring, sleep disruption, witnessed apnea, and daytime sleepiness.  She has hypertension and insulin resistance.  Her BMI is > 35.  She had prior sleep study that showed sleep apnea, and improved with weight loss.  Her symptoms recurred after she regained weight.  She likely has recurrent sleep apnea.  We discussed how sleep  apnea can affect various health problems, including risks for hypertension, cardiovascular disease, and diabetes.  We also discussed how sleep disruption can increase risks for accidents, such as while driving.  Weight loss as a means of improving sleep apnea was also reviewed.  Additional treatment options discussed were CPAP therapy, oral appliance, and surgical intervention.  Assessment/plan:  Snoring with concern for obstructive sleep apnea. - will arrange for home sleep study  There are no Patient Instructions on file for this visit.   Chesley Mires, M.D. Pager [phone] 08/20/2016, 11:27 AM

## 2016-08-20 NOTE — Progress Notes (Signed)
   Subjective:    Patient ID: Julie Bishop, female    DOB: Mar 27, 1971, 46 y.o.   MRN: [id number]  HPI    Review of Systems  Constitutional: Negative for fever and unexpected weight change.  HENT: Negative for congestion, dental problem, ear pain, nosebleeds, postnasal drip, rhinorrhea, sinus pressure, sneezing, sore throat and trouble swallowing.   Eyes: Negative for redness and itching.  Respiratory: Positive for shortness of breath. Negative for cough, chest tightness and wheezing.   Cardiovascular: Negative for palpitations and leg swelling.  Gastrointestinal: Negative for nausea and vomiting.  Genitourinary: Negative for dysuria.  Musculoskeletal: Positive for joint swelling.  Skin: Negative for rash.  Neurological: Positive for headaches.  Hematological: Does not bruise/bleed easily.  Psychiatric/Behavioral: Positive for dysphoric mood. The patient is nervous/anxious.        Objective:   Physical Exam        Assessment & Plan:

## 2016-08-20 NOTE — Patient Instructions (Signed)
Will arrange for home sleep study Will call to arrange for follow up after sleep study reviewed  

## 2016-08-28 ENCOUNTER — Ambulatory Visit (INDEPENDENT_AMBULATORY_CARE_PROVIDER_SITE_OTHER): Payer: Federal, State, Local not specified - PPO | Admitting: Family Medicine

## 2016-08-28 VITALS — BP 122/78 | HR 59 | Temp 98.3°F | Ht 63.0 in | Wt 199.0 lb

## 2016-08-28 DIAGNOSIS — Z6835 Body mass index (BMI) 35.0-35.9, adult: Secondary | ICD-10-CM

## 2016-08-28 DIAGNOSIS — E669 Obesity, unspecified: Secondary | ICD-10-CM | POA: Diagnosis not present

## 2016-08-28 DIAGNOSIS — E8881 Metabolic syndrome: Secondary | ICD-10-CM

## 2016-08-28 NOTE — Progress Notes (Signed)
Office: [phone]  /  Fax: [phone]   HPI:   Chief Complaint: OBESITY Julie Bishop is here to discuss her progress with her obesity treatment plan. She is following her eating plan approximately 100 % of the time and states she is exercising 0 minutes 0 times per week. Siriyah continues to do well with weight loss but sometimes not eating enough calories. She is doing well with meeting her protein goals. Her weight is 199 lb (90.3 kg) today and has had a weight loss of 7 pounds over a period of 2 weeks since her last visit. She has lost 14 lbs since starting treatment with [place].  Insulin Resistance/Pre-Diabetes Arvella has a diagnosis of insulin resistance based on her elevated fasting insulin level >5. Although Tianna's blood glucose readings are still under good control, insulin resistance puts her at greater risk of metabolic syndrome and diabetes. She is taking metformin 1/2 pill every morning and noted decreased polyphagia. She continues to do well on diet and metformin. She will continue to work on diet and exercise to decrease risk of diabetes.   Wt Readings from Last 500 Encounters:  08/28/16 199 lb (90.3 kg)  08/20/16 205 lb 12.8 oz (93.4 kg)  08/14/16 206 lb (93.4 kg)  07/31/16 206 lb (93.4 kg)  07/17/16 218 lb (98.9 kg)  07/12/16 213 lb (96.6 kg)  07/05/16 224 lb 12.8 oz (102 kg)  06/28/16 227 lb 3.2 oz (103.1 kg)  06/25/16 224 lb 3.2 oz (101.7 kg)  03/26/16 219 lb 8 oz (99.6 kg)  03/19/16 220 lb (99.8 kg)  11/15/15 211 lb 10 oz (96 kg)  11/03/15 211 lb (95.7 kg)  09/19/15 202 lb (91.6 kg)  08/23/15 195 lb (88.5 kg)  12/10/14 175 lb (79.4 kg)  12/08/14 179 lb 9.6 oz (81.5 kg)  11/11/14 176 lb 8 oz (80.1 kg)     ALLERGIES: Allergies  Allergen Reactions  . Oxycodone Itching  . Losartan Other (See Comments)    Diarrhea and abodminal cramping    MEDICATIONS: Current Outpatient Prescriptions on File Prior to Visit  Medication Sig Dispense Refill  .  amLODipine (NORVASC) 5 MG tablet Take 1 tablet (5 mg total) by mouth daily. 30 tablet 1  . Cholecalciferol (VITAMIN D PO) Take 5,000 Units by mouth daily. Take one 4,000 IU po qd.     . DULoxetine (CYMBALTA) 60 MG capsule Take 1 capsule by mouth daily.    . hydrochlorothiazide (HYDRODIURIL) 25 MG tablet Take 1 tablet (25 mg total) by mouth daily. 90 tablet 3  . KRILL OIL PO Take 1 capsule by mouth every morning.    Marland Kitchen LORazepam (ATIVAN) 0.5 MG tablet Take 0.5 mg by mouth daily.     . metFORMIN (GLUCOPHAGE) 500 MG tablet Take 1 tablet (500 mg total) by mouth daily with breakfast. (Patient taking differently: Take 250 mg by mouth daily with breakfast. Take 1/2 tab daily) 30 tablet 0  . metoprolol succinate (TOPROL-XL) 50 MG 24 hr tablet Take 1 tablet (50 mg total) by mouth daily. Take with or immediately following a meal. 30 tablet 5  . Vitamin D, Ergocalciferol, (DRISDOL) 50000 units CAPS capsule Take 1 capsule (50,000 Units total) by mouth every 7 (seven) days. 4 capsule 4   No current facility-administered medications on file prior to visit.     PAST MEDICAL HISTORY: Past Medical History:  Diagnosis Date  . Adjustment disorder   . Anemia   . Anxiety   . Back pain   .  Cancer ([place]) 2008   skin cancer, BCC  . Chest discomfort    at times  . Constipation 11/14/2014  . Depression   . Edema    feet and legs  . Generalized anxiety disorder   . GERD (gastroesophageal reflux disease)   . Headache 08/24/2015  . Heart valve problem    trivial leak in mitral valve    trivial leak in tricuspid  . Hyperlipidemia    Elevated, but never taken medication  . Hypertension   . Insomnia 08/24/2015  . Joint pain   . Obesity 11/14/2014  . Pain in joint, shoulder region 11/27/2015  . Palpitations   . Post-menopause 08/23/2015  . Preventative health care 08/23/2015  . Raynauds syndrome   . Sleep apnea    appt with pulmonary Dr in march 2018  . SOB (shortness of breath)   . Vitamin B12 deficiency  08/23/2015  . Vitamin D deficiency 08/23/2015    PAST SURGICAL HISTORY: Past Surgical History:  Procedure Laterality Date  . ABDOMINAL HYSTERECTOMY  2010  . BREAST BIOPSY     right, 1996  . ECTOPIC PREGNANCY SURGERY    . SHOULDER OPEN ROTATOR CUFF REPAIR  2017   right  . TUBAL LIGATION     on right  . TYMPANOSTOMY TUBE PLACEMENT      SOCIAL HISTORY: Social History  Substance Use Topics  . Smoking status: Never Smoker  . Smokeless tobacco: Never Used  . Alcohol use No    FAMILY HISTORY: Family History  Problem Relation Age of Onset  . Heart disease Mother     CAD w/MI in mid 53s  . Diabetes Mother   . Depression Mother   . Hypertension Mother   . Hyperlipidemia Mother   . Liver disease Mother   . Sleep apnea Mother   . Obesity Mother   . Diabetes Father   . Cancer Maternal Grandmother     throat  . Heart disease Maternal Grandmother   . Cancer Paternal Grandmother     lung  . Heart disease Paternal Grandmother   . Hypertension Sister     as a teenager    ROS: Review of Systems  Constitutional: Positive for weight loss.  Endo/Heme/Allergies:       Polyphagia    PHYSICAL EXAM: Blood pressure 122/78, pulse (!) 59, temperature 98.3 F (36.8 C), height 5\' 3"  (1.6 m), weight 199 lb (90.3 kg), SpO2 98 %. Body mass index is 35.25 kg/m. Physical Exam  Constitutional: She is oriented to person, place, and time. She appears well-developed and well-nourished.  Cardiovascular: Normal rate.   Pulmonary/Chest: Effort normal.  Musculoskeletal: Normal range of motion.  Neurological: She is oriented to person, place, and time.  Skin: Skin is warm and dry.  Psychiatric: She has a normal mood and affect. Her behavior is normal.  Vitals reviewed.   RECENT LABS AND TESTS: BMET    Component Value Date/Time   NA 140 07/12/2016 1058   K 4.0 07/12/2016 1058   CL 95 (L) 07/12/2016 1058   CO2 27 07/12/2016 1058   GLUCOSE 88 07/12/2016 1058   GLUCOSE 97 06/28/2016  1233   BUN 14 07/12/2016 1058   CREATININE 0.61 07/12/2016 1058   CALCIUM 9.6 07/12/2016 1058   GFRNONAA 110 07/12/2016 1058   GFRAA 127 07/12/2016 1058   Lab Results  Component Value Date   HGBA1C 5.1 07/12/2016   HGBA1C 5.6 07/05/2016   HGBA1C 5.2 08/23/2015   Lab Results  Component Value  Date   INSULIN 19.1 07/12/2016   CBC    Component Value Date/Time   WBC 6.4 07/12/2016 1058   WBC 8.0 06/28/2016 1233   RBC 4.52 07/12/2016 1058   RBC 4.05 06/28/2016 1233   HGB 11.8 (L) 06/28/2016 1233   HCT 37.7 07/12/2016 1058   PLT 272.0 06/28/2016 1233   MCV 83 07/12/2016 1058   MCH 29.0 07/12/2016 1058   MCHC 34.7 07/12/2016 1058   MCHC 34.1 06/28/2016 1233   RDW 14.5 07/12/2016 1058   LYMPHSABS 2.1 07/12/2016 1058   EOSABS 0.2 07/12/2016 1058   BASOSABS 0.0 07/12/2016 1058   Iron/TIBC/Ferritin/ %Sat No results found for: IRON, TIBC, FERRITIN, IRONPCTSAT Lipid Panel     Component Value Date/Time   CHOL 226 (H) 07/12/2016 1058   TRIG 93 07/12/2016 1058   HDL 73 07/12/2016 1058   CHOLHDL 3 06/28/2016 1233   VLDL 25.0 06/28/2016 1233   LDLCALC 134 (H) 07/12/2016 1058   Hepatic Function Panel     Component Value Date/Time   PROT 7.5 07/12/2016 1058   ALBUMIN 4.4 07/12/2016 1058   AST 19 07/12/2016 1058   ALT 17 07/12/2016 1058   ALKPHOS 59 07/12/2016 1058   BILITOT 0.3 07/12/2016 1058      Component Value Date/Time   TSH 1.340 07/12/2016 1058   TSH 2.54 06/28/2016 1233   TSH 1.64 03/26/2016 1617    ASSESSMENT AND PLAN: Insulin resistance  Class 2 obesity without serious comorbidity with body mass index (BMI) of 35.0 to 35.9 in adult, unspecified obesity type  PLAN:  Insulin Resistance/Pre-Diabetes Navpreet will continue to work on weight loss, exercise, and decreasing simple carbohydrates in her diet to help decrease the risk of diabetes. We dicussed metformin including benefits and risks. She was informed that eating too many simple carbohydrates or too  many calories at one sitting increases the likelihood of GI side effects. Nikcole agrees to continue to take metformin and follow up with [place] as directed to monitor her progress.  We spent > than 50% of the 15 minute visit on the counseling as documented in the note.   Obesity Joua is currently in the action stage of change. As such, her goal is to continue with weight loss efforts She has agreed to keep a food journal with 1200 to 1500 calories and 80+ grams of protein daily Geneive has been instructed to work up to a goal of 150 minutes of combined cardio and strengthening exercise per week or start cardio 30 minutes 3 to 5 times per week for weight loss and overall health benefits. We discussed the following Behavioral Modification Stratagies today: increasing lean protein intake, increasing lower sugar fruits and work on meal planning and easy cooking plans  Arayna has agreed to follow up with our clinic in 2 weeks. She was informed of the importance of frequent follow up visits to maximize her success with intensive lifestyle modifications for her multiple health conditions.  I, Doreene Nest, am acting as scribe for Dennard Nip, MD  I have reviewed the above documentation for accuracy and completeness, and I agree with the above. -Dennard Nip, MD

## 2016-08-29 ENCOUNTER — Other Ambulatory Visit (INDEPENDENT_AMBULATORY_CARE_PROVIDER_SITE_OTHER): Payer: Self-pay | Admitting: Family Medicine

## 2016-08-29 DIAGNOSIS — E8881 Metabolic syndrome: Secondary | ICD-10-CM

## 2016-08-31 ENCOUNTER — Other Ambulatory Visit: Payer: Self-pay | Admitting: *Deleted

## 2016-08-31 DIAGNOSIS — I10 Essential (primary) hypertension: Secondary | ICD-10-CM

## 2016-08-31 MED ORDER — AMLODIPINE BESYLATE 5 MG PO TABS: 5.0000 mg | ORAL_TABLET | Freq: Every day | ORAL | 0 refills | Status: DC

## 2016-08-31 NOTE — Telephone Encounter (Signed)
Rx sent to the pharmacy by e-script.  Pt needs to schedule and appt before further refills are given.//AB/CMA

## 2016-09-04 ENCOUNTER — Telehealth: Payer: Self-pay | Admitting: Family Medicine

## 2016-09-04 ENCOUNTER — Ambulatory Visit: Payer: Federal, State, Local not specified - PPO | Admitting: Family Medicine

## 2016-09-04 NOTE — Telephone Encounter (Signed)
No charge. 

## 2016-09-04 NOTE — Telephone Encounter (Signed)
Patient lvm at 2:56pm cancelling her 5:30pm appointment no explanation given, charge or no charge

## 2016-09-06 DIAGNOSIS — G4733 Obstructive sleep apnea (adult) (pediatric): Secondary | ICD-10-CM | POA: Diagnosis not present

## 2016-09-10 DIAGNOSIS — R05 Cough: Secondary | ICD-10-CM | POA: Diagnosis not present

## 2016-09-10 DIAGNOSIS — J069 Acute upper respiratory infection, unspecified: Secondary | ICD-10-CM | POA: Diagnosis not present

## 2016-09-11 ENCOUNTER — Encounter: Payer: Self-pay | Admitting: Family Medicine

## 2016-09-12 ENCOUNTER — Ambulatory Visit (INDEPENDENT_AMBULATORY_CARE_PROVIDER_SITE_OTHER): Payer: Federal, State, Local not specified - PPO | Admitting: Medical

## 2016-09-12 ENCOUNTER — Ambulatory Visit (INDEPENDENT_AMBULATORY_CARE_PROVIDER_SITE_OTHER): Payer: Federal, State, Local not specified - PPO | Admitting: Family Medicine

## 2016-09-12 ENCOUNTER — Encounter: Payer: Self-pay | Admitting: Pulmonary Disease

## 2016-09-12 ENCOUNTER — Encounter (INDEPENDENT_AMBULATORY_CARE_PROVIDER_SITE_OTHER): Payer: Self-pay

## 2016-09-12 ENCOUNTER — Encounter: Payer: Self-pay | Admitting: Medical

## 2016-09-12 ENCOUNTER — Telehealth: Payer: Self-pay | Admitting: Pulmonary Disease

## 2016-09-12 VITALS — BP 121/78 | HR 84 | Temp 99.6°F | Resp 16 | Ht 63.0 in | Wt 201.0 lb

## 2016-09-12 DIAGNOSIS — J40 Bronchitis, not specified as acute or chronic: Secondary | ICD-10-CM | POA: Diagnosis not present

## 2016-09-12 DIAGNOSIS — J0110 Acute frontal sinusitis, unspecified: Secondary | ICD-10-CM

## 2016-09-12 DIAGNOSIS — R05 Cough: Secondary | ICD-10-CM | POA: Diagnosis not present

## 2016-09-12 DIAGNOSIS — J301 Allergic rhinitis due to pollen: Secondary | ICD-10-CM | POA: Diagnosis not present

## 2016-09-12 DIAGNOSIS — G4733 Obstructive sleep apnea (adult) (pediatric): Secondary | ICD-10-CM

## 2016-09-12 DIAGNOSIS — R059 Cough, unspecified: Secondary | ICD-10-CM

## 2016-09-12 HISTORY — DX: Obstructive sleep apnea (adult) (pediatric): G47.33

## 2016-09-12 MED ORDER — DOXYCYCLINE HYCLATE 100 MG PO TABS: 100.0000 mg | ORAL_TABLET | Freq: Two times a day (BID) | ORAL | 0 refills | Status: DC

## 2016-09-12 MED ORDER — FLUTICASONE PROPIONATE 50 MCG/ACT NA SUSP: 2.0000 | Freq: Every day | NASAL | 1 refills | Status: DC

## 2016-09-12 MED ORDER — LEVOCETIRIZINE DIHYDROCHLORIDE 5 MG PO TABS: 5.0000 mg | ORAL_TABLET | Freq: Every evening | ORAL | 0 refills | Status: DC

## 2016-09-12 NOTE — Progress Notes (Signed)
Pre visit review using our clinic review tool, if applicable. No additional management support is needed unless otherwise documented below in the visit note. 

## 2016-09-12 NOTE — Patient Instructions (Signed)
For allergies rx xyzal and flonase.  For sinusitis and bronchitis rx doxycycline antibiotic.  For cough use your benzonatate.  Your rapid test for strep did not show result/control. Bad test. I thought unnecessary to repeat as did not think test indicated after looking at your throat(MA ran test before I examined)  Follow up in 7 days or as needed  Stop norel and claritin

## 2016-09-12 NOTE — Progress Notes (Signed)
Subjective:    Patient ID: Julie [place], female    DOB: 1970/08/06, 46 y.o.   MRN: [id number]  HPI  Pt in for some recent symptoms that started over weekend .  Pt states started with scratchy throat, some sneezing and feeling nasal congested. Some sinus pressure  Pt feels like getting chest congested. Pt feels like she needs to bring up mucous when she coughs.   No wheezing.   Pt was tested for flu at [facility]. Test was negative.  Pt has some mild rt sciatic pain. She states gets this in past when just laying around.  lmp- hysterectomy.  Review of Systems  Constitutional: Negative for chills, fatigue and fever.  HENT: Positive for congestion, postnasal drip, sinus pain and sinus pressure. Negative for sore throat.        Faint sore throat.  Respiratory: Positive for cough. Negative for chest tightness, shortness of breath and wheezing.        Chest congestion  Cardiovascular: Negative for chest pain and palpitations.  Gastrointestinal: Negative for abdominal pain, blood in stool, constipation, diarrhea, nausea and vomiting.  Musculoskeletal: Negative for back pain.       Rt si area pain on palpation.  Skin: Negative for rash.  Neurological: Negative for dizziness, weakness, light-headedness and headaches.  Hematological: Negative for adenopathy. Does not bruise/bleed easily.  Psychiatric/Behavioral: Negative for behavioral problems and confusion.     Past Medical History:  Diagnosis Date  . Adjustment disorder   . Anemia   . Anxiety   . Back pain   . Cancer ([place]) 2008   skin cancer, BCC  . Chest discomfort    at times  . Constipation 11/14/2014  . Depression   . Edema    feet and legs  . Generalized anxiety disorder   . GERD (gastroesophageal reflux disease)   . Headache 08/24/2015  . Heart valve problem    trivial leak in mitral valve    trivial leak in tricuspid  . Hyperlipidemia    Elevated, but never taken medication  . Hypertension   . Insomnia  08/24/2015  . Joint pain   . Obesity 11/14/2014  . Pain in joint, shoulder region 11/27/2015  . Palpitations   . Post-menopause 08/23/2015  . Preventative health care 08/23/2015  . Raynauds syndrome   . Sleep apnea    appt with pulmonary Dr in march 2018  . SOB (shortness of breath)   . Vitamin B12 deficiency 08/23/2015  . Vitamin D deficiency 08/23/2015     Social History   Social History  . Marital status: Married    Spouse name: Dreyah Montrose  . Number of children: 1  . Years of education: 82   Occupational History  . claims representative w/ [employer]    Social History Main Topics  . Smoking status: Never Smoker  . Smokeless tobacco: Never Used  . Alcohol use No  . Drug use: No  . Sexual activity: Yes    Partners: Male    Birth control/ protection: Post-menopausal     Comment: 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[place], [employer] lives with husband   Other Topics Concern  . Not on file   Social History Narrative  . No narrative on file    Past Surgical History:  Procedure Laterality Date  . ABDOMINAL HYSTERECTOMY  2010  . BREAST BIOPSY     right, 1996  . ECTOPIC PREGNANCY SURGERY    . SHOULDER OPEN ROTATOR CUFF REPAIR  2017   right  . TUBAL LIGATION     on right  . TYMPANOSTOMY TUBE PLACEMENT      Family History  Problem Relation Age of Onset  . Heart disease Mother     CAD w/MI in mid 34s  . Diabetes Mother   . Depression Mother   . Hypertension Mother   . Hyperlipidemia Mother   . Liver disease Mother   . Sleep apnea Mother   . Obesity Mother   . Diabetes Father   . Cancer Maternal Grandmother     throat  . Heart disease Maternal Grandmother   . Cancer Paternal Grandmother     lung  . Heart disease Paternal Grandmother   . Hypertension Sister     as a teenager    Allergies  Allergen Reactions  . Oxycodone Itching  . Losartan Other (See Comments)    Diarrhea and abodminal cramping    Current Outpatient Prescriptions on File  Prior to Visit  Medication Sig Dispense Refill  . amLODipine (NORVASC) 5 MG tablet Take 1 tablet (5 mg total) by mouth daily. 30 tablet 0  . Cholecalciferol (VITAMIN D PO) Take 5,000 Units by mouth daily. Take one 4,000 IU po qd.     . DULoxetine (CYMBALTA) 60 MG capsule Take 1 capsule by mouth daily.    . hydrochlorothiazide (HYDRODIURIL) 25 MG tablet Take 1 tablet (25 mg total) by mouth daily. 90 tablet 3  . KRILL OIL PO Take 1 capsule by mouth every morning.    Marland Kitchen LORazepam (ATIVAN) 0.5 MG tablet Take 0.5 mg by mouth daily.     . metFORMIN (GLUCOPHAGE) 500 MG tablet Take 1 tablet (500 mg total) by mouth daily with breakfast. (Patient taking differently: Take 250 mg by mouth daily with breakfast. Take 1/2 tab daily) 30 tablet 0  . metoprolol succinate (TOPROL-XL) 50 MG 24 hr tablet Take 1 tablet (50 mg total) by mouth daily. Take with or immediately following a meal. 30 tablet 5  . Vitamin D, Ergocalciferol, (DRISDOL) 50000 units CAPS capsule Take 1 capsule (50,000 Units total) by mouth every 7 (seven) days. 4 capsule 4   No current facility-administered medications on file prior to visit.     BP 121/78 (BP Location: Right Arm, Patient Position: Sitting, Cuff Size: Large)   Pulse 84   Temp 99.6 F (37.6 C) (Oral)   Resp 16   Ht 5\' 3"  (1.6 m)   Wt 201 lb (91.2 kg)   SpO2 99%   BMI 35.61 kg/m       Objective:   Physical Exam  General  Mental Status - Alert. General Appearance - Well groomed. Not in acute distress.  Skin Rashes- No Rashes.  HEENT Head- Normal. Ear Auditory Canal - Left- Normal. Right - Normal.Tympanic Membrane- Left- Normal. Right- Normal. Eye Sclera/Conjunctiva- Left- Normal. Right- Normal. Nose & Sinuses Nasal Mucosa- Left-  Boggy and Congested. Right-  Boggy and  Congested.Bilateral  No maxillary pressure but left  frontal sinus pressure. Mouth & Throat Lips: Upper Lip- Normal: no dryness, cracking, pallor, cyanosis, or vesicular eruption. Lower  Lip-Normal: no dryness, cracking, pallor, cyanosis or vesicular eruption. Buccal Mucosa- Bilateral- No Aphthous ulcers. Oropharynx- No Discharge or Erythema. Tonsils: Characteristics- Bilateral- No Erythema or Congestion. Size/Enlargement- Bilateral- No enlargement. Discharge- bilateral-None.  Neck Neck- Supple. No Masses.   Chest and Lung Exam Auscultation: Breath Sounds:-Clear even and unlabored.  Cardiovascular Auscultation:Rythm- Regular, rate and rhythm. Murmurs & Other Heart Sounds:Ausculatation of the heart reveal-  No Murmurs.  Lymphatic Head & Neck General Head & Neck Lymphatics: Bilateral: Description- No Localized lymphadenopathy.   Back- Rt si area tenderness to palpation.  No mid lumbar pain.  Rt hip- good range of motion. No crepitus.     Assessment & Plan:  For allergies rx xyzal and flonase.  For sinusitis and bronchitis rx doxycycline antibiotic.  For cough use your benzonatate.  Your rapid test for strep did not show result/control. Bad test. I thought unnecessary to repeat as did not think test indicated after looking at your throat(MA ran test before I examined)  Follow up in 7 days or as needed  Stop norel and claritin  Advised ibuprofen for mild sciatica type pain.  Rayshaun Needle, Percell [place], PA-C

## 2016-09-12 NOTE — Telephone Encounter (Signed)
HST 09/06/16 >> AHI 17.5, SaO2 low 77%   Will have my nurse inform pt that sleep study shows moderate sleep apnea.  Options are 1) CPAP now, 2) ROV first.  If She is agreeable to CPAP, then please send order for auto CPAP range 5 to 15 cm H2O with heated humidity and mask of choice.  Have download sent 1 month after starting CPAP and set up ROV 2 months after starting CPAP.  ROV can be with me or NP.

## 2016-09-13 NOTE — Telephone Encounter (Signed)
Spoke with the pt and notified of results per VS  She verbalized understanding  She wants to go ahead and get started on CPAP  Order sent to [facility]  Pt knows to call for 2 month ov with VS or NP once she received her machine

## 2016-09-13 NOTE — Telephone Encounter (Signed)
Patient requesting sleep study results, patient contact # [phone].Julie [place]

## 2016-09-14 ENCOUNTER — Emergency Department (HOSPITAL_COMMUNITY): Payer: Federal, State, Local not specified - PPO

## 2016-09-14 ENCOUNTER — Telehealth: Payer: Self-pay | Admitting: Family Medicine

## 2016-09-14 ENCOUNTER — Emergency Department (HOSPITAL_COMMUNITY)
Admission: EM | Admit: 2016-09-14 | Discharge: 2016-09-14 | Disposition: A | Discharge status: Home / Self Care | Payer: Federal, State, Local not specified - PPO | Attending: Emergency Medicine | Admitting: Emergency Medicine

## 2016-09-14 ENCOUNTER — Encounter (HOSPITAL_COMMUNITY): Payer: Self-pay

## 2016-09-14 DIAGNOSIS — J029 Acute pharyngitis, unspecified: Secondary | ICD-10-CM

## 2016-09-14 DIAGNOSIS — Z85828 Personal history of other malignant neoplasm of skin: Secondary | ICD-10-CM | POA: Insufficient documentation

## 2016-09-14 DIAGNOSIS — J40 Bronchitis, not specified as acute or chronic: Secondary | ICD-10-CM | POA: Diagnosis not present

## 2016-09-14 DIAGNOSIS — I10 Essential (primary) hypertension: Secondary | ICD-10-CM | POA: Diagnosis not present

## 2016-09-14 DIAGNOSIS — J069 Acute upper respiratory infection, unspecified: Secondary | ICD-10-CM

## 2016-09-14 DIAGNOSIS — Z79899 Other long term (current) drug therapy: Secondary | ICD-10-CM | POA: Insufficient documentation

## 2016-09-14 DIAGNOSIS — R05 Cough: Secondary | ICD-10-CM

## 2016-09-14 DIAGNOSIS — R059 Cough, unspecified: Secondary | ICD-10-CM

## 2016-09-14 MED ORDER — ALBUTEROL SULFATE HFA 108 (90 BASE) MCG/ACT IN AERS
2.0000 | INHALATION_SPRAY | Freq: Once | RESPIRATORY_TRACT | Status: AC
  Administered 2016-09-14: 2 via RESPIRATORY_TRACT
  Filled 2016-09-14: qty 6.7

## 2016-09-14 MED ORDER — ACETAMINOPHEN 325 MG PO TABS
650.0000 mg | ORAL_TABLET | Freq: Once | ORAL | Status: AC
  Administered 2016-09-14: 650 mg via ORAL
  Filled 2016-09-14: qty 2

## 2016-09-14 NOTE — Telephone Encounter (Signed)
cxr order placed. She can get today.

## 2016-09-14 NOTE — ED Triage Notes (Signed)
Pt dx'd with viral URI on Sat. Pt went to her MD on Wed and was told the same thing. Pt states that her symptoms have not subsided. She is currently complaining of dry cough, sore throat and congestion. A&Ox4. Reports low grade fever at home.

## 2016-09-14 NOTE — ED Provider Notes (Signed)
[facility] DEPT Provider Note   CSN: [id number] Arrival date & time: 09/14/16  1651  By signing my name below, I, Ethelle Lyon Long, attest that this documentation has been prepared under the direction and in the presence of [address] [employer]. Electronically Signed: Ethelle Lyon Long, Scribe. 09/14/2016. 5:36 PM.  History   Chief Complaint Chief Complaint  Patient presents with  . Nasal Congestion  . Sore Throat  . Cough   The history is provided by the patient and medical records. No language interpreter was used.  Cough  This is a new problem. The current episode started more than 2 days ago. The problem occurs constantly. The problem has not changed since onset.The cough is non-productive. The maximum temperature recorded prior to her arrival was 100 to 100.9 F. The fever has been present for 1 to 2 days. Associated symptoms include rhinorrhea, sore throat and myalgias. Pertinent negatives include no chest pain, no ear pain, no shortness of breath and no wheezing. Treatments tried: xyzal, flonase, tessalon perles, ibuprofen. The treatment provided no relief. She is not a smoker. Her past medical history does not include COPD or asthma.   HPI Comments:  Julie [place] is a 46 y.o. female with a PMHx of Depression, HLD, Palpitations, Anxiety, HTN, Vitamin B12 Deficiency, GERD, Anemia, and Raynauds Syndrome, who presents to the Emergency Department complaining of persistent, unchanging, dry cough and URI symptoms onset six days ago. States that she feels like she has congestion in her chest but can't actually cough anything up. One time she had some scant yellow sputum production after the cough, but nothing very significant. Pt has associated symptoms of sore/scratchy throat, nasal and chest congestion, diminished appetite, nausea, body aches, and fever (TMax 100.3). Per chart review, pt was seen at an UC six days ago and tested negative for flu, given Tessalon and Ibuprofen for  possible viral infection. She then was seen by her PCP two days ago (09/12/16) for same, had RST which was negative, and she was diagnosed with bronchitis and sinusitis, and Rx'd xyzal, flonase, and doxycycline. She reports the ibuprofen provides mild relief of her body aches/pain.  No h/o Asthma or COPD. 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 Nonsmoker. She denies ear pain, ear drainage, trismus, drooling, difficulty swallowing, CP, SOB, wheezing, abd pain, V/D/C, hematuria, dysuria, arthralgias, numbness, tingling, focal weakness, rashes, or any other complaints at this time.   Past Medical History:  Diagnosis Date  . Adjustment disorder   . Anemia   . Anxiety   . Back pain   . Cancer ([place]) 2008   skin cancer, BCC  . Chest discomfort    at times  . Constipation 11/14/2014  . Depression   . Edema    feet and legs  . Generalized anxiety disorder   . GERD (gastroesophageal reflux disease)   . Headache 08/24/2015  . Heart valve problem    trivial leak in mitral valve    trivial leak in tricuspid  . Hyperlipidemia    Elevated, but never taken medication  . Hypertension   . Insomnia 08/24/2015  . Joint pain   . Obesity 11/14/2014  . OSA (obstructive sleep apnea) 09/12/2016  . Pain in joint, shoulder region 11/27/2015  . Palpitations   . Post-menopause 08/23/2015  . Preventative health care 08/23/2015  . Raynauds syndrome   . Sleep apnea    appt with pulmonary Dr in march 2018  . SOB (  shortness of breath)   . Vitamin B12 deficiency 08/23/2015  . Vitamin D deficiency 08/23/2015    Patient Active Problem List   Diagnosis Date Noted  . OSA (obstructive sleep apnea) 09/12/2016  . SOB (shortness of breath) 07/01/2016  . Chest pain 07/01/2016  . Headache disorder 04/01/2016  . Snoring 04/01/2016  . Pain in joint, shoulder region 11/27/2015  . Insomnia 08/24/2015  . Vitamin D deficiency 08/23/2015  . Vitamin B12 deficiency 08/23/2015    . Preventative health care 08/23/2015  . Post-menopause 08/23/2015  . Polydipsia 12/08/2014  . Constipation 11/14/2014  . Obesity 11/14/2014  . Generalized anxiety disorder   . Cancer ([place])   . GERD (gastroesophageal reflux disease)   . Hyperlipidemia   . Hypertension    Past Surgical History:  Procedure Laterality Date  . ABDOMINAL HYSTERECTOMY  2010  . BREAST BIOPSY     right, 1996  . ECTOPIC PREGNANCY SURGERY    . SHOULDER OPEN ROTATOR CUFF REPAIR  2017   right  . TUBAL LIGATION     on right  . TYMPANOSTOMY TUBE PLACEMENT     OB History    Gravida Para Term Preterm AB Living   3 1 1     1    SAB TAB Ectopic Multiple Live Births                 Home Medications    Prior to Admission medications   Medication Sig Start Date End Date Taking? Authorizing Provider  amLODipine (NORVASC) 5 MG tablet Take 1 tablet (5 mg total) by mouth daily. 08/31/16   [name], MD  benzonatate (TESSALON) 100 MG capsule Take 1 capsule by mouth 3 (three) times daily. 09/10/16   Historical Provider, MD  Cholecalciferol (VITAMIN D PO) Take 5,000 Units by mouth daily. Take one 4,000 IU po qd.     Historical Provider, MD  doxycycline (VIBRA-TABS) 100 MG tablet Take 1 tablet (100 mg total) by mouth 2 (two) times daily. Can give caps or generic 09/12/16   Mackie Pai, PA-C  DULoxetine (CYMBALTA) 60 MG capsule Take 1 capsule by mouth daily. 08/21/15   Historical Provider, MD  fluticasone (FLONASE) 50 MCG/ACT nasal spray Place 2 sprays into both nostrils daily. 09/12/16   Percell Miller Saguier, PA-C  hydrochlorothiazide (HYDRODIURIL) 25 MG tablet Take 1 tablet (25 mg total) by mouth daily. 07/05/16   Crosby Oyster Wendling, DO  KRILL OIL PO Take 1 capsule by mouth every morning.    Historical Provider, MD  levocetirizine (XYZAL) 5 MG tablet Take 1 tablet (5 mg total) by mouth every evening. 09/12/16   Percell Miller Saguier, PA-C  LORazepam (ATIVAN) 0.5 MG tablet Take 0.5 mg by mouth daily.  08/21/15   Historical  Provider, MD  metFORMIN (GLUCOPHAGE) 500 MG tablet Take 1 tablet (500 mg total) by mouth daily with breakfast. Patient taking differently: Take 250 mg by mouth daily with breakfast. Take 1/2 tab daily 07/31/16   Caren D Leafy Ro, MD  metoprolol succinate (TOPROL-XL) 50 MG 24 hr tablet Take 1 tablet (50 mg total) by mouth daily. Take with or immediately following a meal. 03/26/16   [name], MD  Vitamin D, Ergocalciferol, (DRISDOL) 50000 units CAPS capsule Take 1 capsule (50,000 Units total) by mouth every 7 (seven) days. 06/29/16   [name], MD   Family History Family History  Problem Relation Age of Onset  . Heart disease Mother     CAD w/MI in mid 53s  .  Diabetes Mother   . Depression Mother   . Hypertension Mother   . Hyperlipidemia Mother   . Liver disease Mother   . Sleep apnea Mother   . Obesity Mother   . Diabetes Father   . Cancer Maternal Grandmother     throat  . Heart disease Maternal Grandmother   . Cancer Paternal Grandmother     lung  . Heart disease Paternal Grandmother   . Hypertension Sister     as a teenager    Social History Social History  Substance Use Topics  . Smoking status: Never Smoker  . Smokeless tobacco: Never Used  . Alcohol use No   Allergies   Oxycodone and Losartan  Review of Systems Review of Systems  Constitutional: Positive for appetite change (decreased) and fever.  HENT: Positive for congestion (nasal), rhinorrhea and sore throat. Negative for drooling, ear discharge, ear pain and trouble swallowing.        Neg trismus, drooling  Respiratory: Positive for cough. Negative for shortness of breath and wheezing.   Cardiovascular: Negative for chest pain.  Gastrointestinal: Positive for nausea. Negative for abdominal pain, constipation, diarrhea and vomiting.  Genitourinary: Negative for dysuria and hematuria.  Musculoskeletal: Positive for myalgias. Negative for arthralgias.  Skin: Negative for color change.    Allergic/Immunologic: Negative for immunocompromised state.  Neurological: Negative for weakness and numbness.  Psychiatric/Behavioral: Negative for confusion.    All systems reviewed and are negative for acute change except as noted in the HPI.   Physical Exam Updated Vital Signs BP (!) 132/95 (BP Location: Left Arm)   Pulse 90   Temp 100.2 F (37.9 C) (Oral)   Resp 16   Ht 5\' 3"  (1.6 m)   Wt 200 lb 4 oz (90.8 kg)   SpO2 99%   BMI 35.47 kg/m   Physical Exam  Constitutional: She is oriented to person, place, and time. Vital signs are normal. She appears well-developed and well-nourished.  Non-toxic appearance. No distress.  Low grade temp 100.2, nontoxic, NAD  HENT:  Head: Normocephalic and atraumatic.  Nose: Mucosal edema and rhinorrhea present.  Mouth/Throat: Uvula is midline, oropharynx is clear and moist and mucous membranes are normal. No trismus in the jaw. No uvula swelling. Tonsils are 0 on the right. Tonsils are 0 on the left. No tonsillar exudate.  Nose mildly congested with rhinorrhea. Oropharynx clear and moist, without uvular swelling or deviation, no trismus or drooling, no tonsillar swelling or erythema, no exudates.    Eyes: Conjunctivae and EOM are normal. Right eye exhibits no discharge. Left eye exhibits no discharge.  Neck: Normal range of motion. Neck supple.  Cardiovascular: Normal rate, regular rhythm, normal heart sounds and intact distal pulses.  Exam reveals no gallop and no friction rub.   No murmur heard. Pulmonary/Chest: Effort normal and breath sounds normal. No respiratory distress. She has no decreased breath sounds. She has no wheezes. She has no rhonchi. She has no rales.  CTAB in all lung fields, no w/r/r, no hypoxia or increased WOB, speaking in full sentences, SpO2 99% on RA   Abdominal: Soft. Normal appearance and bowel sounds are normal. She exhibits no distension. There is no tenderness. There is no rigidity, no rebound, no guarding, no CVA  tenderness, no tenderness at McBurney's point and negative Murphy's sign.  Musculoskeletal: Normal range of motion.  Neurological: She is alert and oriented to person, place, and time. She has normal strength. No sensory deficit.  Skin: Skin is warm, dry  and intact. No rash noted.  Psychiatric: She has a normal mood and affect.  Nursing note and vitals reviewed.    ED Treatments / Results  DIAGNOSTIC STUDIES:  Oxygen Saturation is 99% on RA, normal by my interpretation.    COORDINATION OF CARE:  5:36 PM Discussed treatment plan with pt at bedside including CXR, albuterol, tylenol, and pt agreed to plan.  Labs (all labs ordered are listed, but only abnormal results are displayed) Labs Reviewed - No data to display  EKG  EKG Interpretation None       Radiology Dg Chest 2 View  Result Date: 09/14/2016 CLINICAL DATA:  Viral URI, nonproductive cough with congestion EXAM: CHEST  2 VIEW COMPARISON:  None. FINDINGS: The heart size and mediastinal contours are within normal limits. Both lungs are clear. The visualized skeletal structures are unremarkable. IMPRESSION: No active cardiopulmonary disease. Electronically Signed   By: Donavan Foil M.D.   On: 09/14/2016 18:09    Procedures Procedures (including critical care time)  Medications Ordered in ED Medications  albuterol (PROVENTIL HFA;VENTOLIN HFA) 108 (90 Base) MCG/ACT inhaler 2 puff (2 puffs Inhalation Given 09/14/16 1804)  acetaminophen (TYLENOL) tablet 650 mg (650 mg Oral Given 09/14/16 1804)     Initial Impression / Assessment and Plan / ED Course  I have reviewed the triage vital signs and the nursing notes.  Pertinent labs & imaging results that were available during my care of the patient were reviewed by me and considered in my medical decision making (see chart for details).     46 y.o. female here with ongoing URI symptoms x6 days. Seen at UC at onset and flu swab neg, sent home with tessalon perles; then went  to her PCP 2 days ago, RST neg there, rx'd doxycycline, xyzal, and flonase; no significant relief. Still having low grade temps, chest and nasal congestion, dry cough, and sore throat. Called her PCP today and had CXR ordered, but showed up here instead of doing her outpatient CXR. On exam, low-grade temp 100.2, nontoxic, mild nasal congestion, throat completely clear without tonsillar swelling/exudates, lungs sound clear but cough sounds harsh like there's some congestion rattling around. Will give inhaler to see if we can make the cough more productive and open her up more, and give tylenol for her fever. Will get CXR and reassess shortly. Doubt need for RST given exam findings. Will reassess shortly  7:24 PM CXR neg. Pt feels much better, and lung sounds seem more open, after inhaler. This is likely viral (?flu), too late for tamiflu, but I think it's reasonable for her to continue the outpatient rx's she was given already, including doxycycline although I doubt this is actually a bacterial process. Advised use of inhaler. Discussed use of mucinex and other OTC remedies for relief of symptoms, as well as all the other meds she was given. F/up with PCP in 1wk for recheck of symptoms. I explained the diagnosis and have given explicit precautions to return to the ER including for any other new or worsening symptoms. The patient understands and accepts the medical plan as it's been dictated and I have answered their questions. Discharge instructions concerning home care and prescriptions have been given. The patient is STABLE and is discharged to home in good condition.   I personally performed the services described in this documentation, which was scribed in my presence. The recorded information has been reviewed and is accurate.   Final Clinical Impressions(s) / ED Diagnoses   Final diagnoses:  Bronchitis  Upper respiratory tract infection, unspecified type  Cough  Sore throat    New  Prescriptions New Prescriptions   No medications on file     [address] PA-C 09/14/16 [place], MD 09/15/16 1149

## 2016-09-14 NOTE — Discharge Instructions (Signed)
Continue to stay well-hydrated. Gargle warm salt water and spit it out. Use chloraseptic spray as needed for sore throat. Continue to alternate between Tylenol and Ibuprofen for pain or fever. Use Mucinex for cough suppression/expectoration of mucus. Use netipot and flonase to help with nasal congestion. May consider over-the-counter Benadryl or other antihistamine to decrease secretions and for help with your symptoms. Use inhaler as directed, as needed for cough/chest congestion/wheezing/shortness of breath. Use Tesslon pearls for cough suppression. Continue using all the medications prescribed to you by your regular doctor. Follow up with your primary care doctor in 5-7 days for recheck of ongoing symptoms. Return to emergency department for emergent changing or worsening of symptoms.

## 2016-09-14 NOTE — Telephone Encounter (Signed)
Patient checked into [facility].

## 2016-09-14 NOTE — Telephone Encounter (Signed)
°  Relation to [place]:KGOV Call back number:[phone] Pharmacy: [facility] at [place], [place] - [place] [phone] (Phone) [phone] (Fax)     Reason for call:  Patient states symtomps have not improved, patient experiencing deep cough & congestion patient requesting chest x ray. Patient states she has not completed antibiotics prescribed by Percell Miller, please advise

## 2016-09-17 ENCOUNTER — Encounter (INDEPENDENT_AMBULATORY_CARE_PROVIDER_SITE_OTHER): Payer: Self-pay

## 2016-09-17 ENCOUNTER — Ambulatory Visit (INDEPENDENT_AMBULATORY_CARE_PROVIDER_SITE_OTHER): Payer: Federal, State, Local not specified - PPO | Admitting: Family Medicine

## 2016-09-17 ENCOUNTER — Other Ambulatory Visit: Payer: Self-pay | Admitting: *Deleted

## 2016-09-17 ENCOUNTER — Telehealth: Payer: Self-pay | Admitting: Family Medicine

## 2016-09-17 DIAGNOSIS — G4733 Obstructive sleep apnea (adult) (pediatric): Secondary | ICD-10-CM | POA: Diagnosis not present

## 2016-09-17 NOTE — Telephone Encounter (Signed)
Caller name: Relationship to patient: Self Can be reached: [phone]  Pharmacy:  [facility] at [place], [place] [phone] (Phone) [phone] (Fax)     Reason for call: Patient request Diflucan for yeast infection

## 2016-09-17 NOTE — Telephone Encounter (Signed)
Ok to write Diflucan 150 mg tabs, 1 tab po q week x 2 weeks. Disp #2 tabs

## 2016-09-18 MED ORDER — FLUCONAZOLE 150 MG PO TABS: ORAL_TABLET | ORAL | 0 refills | Status: DC

## 2016-09-18 NOTE — Telephone Encounter (Signed)
It was sent in this morning and patient was informed

## 2016-09-18 NOTE — Telephone Encounter (Signed)
Pt's spouse Elenore Rota) called to know the status of medication, pt's spouse was informed will be called to let them know when prescription is sent to pharmacy. Please advise.

## 2016-09-18 NOTE — Telephone Encounter (Signed)
Sent in and patient informed 

## 2016-09-19 ENCOUNTER — Ambulatory Visit (INDEPENDENT_AMBULATORY_CARE_PROVIDER_SITE_OTHER): Payer: Federal, State, Local not specified - PPO | Admitting: Medical

## 2016-09-19 ENCOUNTER — Encounter: Payer: Self-pay | Admitting: Medical

## 2016-09-19 VITALS — BP 118/75 | HR 62 | Temp 98.1°F | Resp 16 | Ht 63.0 in | Wt 202.6 lb

## 2016-09-19 DIAGNOSIS — B029 Zoster without complications: Secondary | ICD-10-CM | POA: Diagnosis not present

## 2016-09-19 DIAGNOSIS — M546 Pain in thoracic spine: Secondary | ICD-10-CM

## 2016-09-19 DIAGNOSIS — M792 Neuralgia and neuritis, unspecified: Secondary | ICD-10-CM | POA: Diagnosis not present

## 2016-09-19 MED ORDER — FAMCICLOVIR 500 MG PO TABS: 500.0000 mg | ORAL_TABLET | Freq: Three times a day (TID) | ORAL | 0 refills | Status: DC

## 2016-09-19 NOTE — Progress Notes (Signed)
Subjective:    Patient ID: Julie Bishop, female    DOB: 1970/11/03, 46 y.o.   MRN: [id number]  HPI   Pt in this morning and woke up noticing some pain on rt side of her back. Very sensitive to light touch on skin. No rash. No pain on movement. No fall.   Pt states getting better from her prior bronchitis(I saw first but when was not improving went to ED). Still coughing on occasion but overall getting better. Pt cxr 5 days ago was normal.   No fever or chills.  Pt states ED continued every thing I rx'd but added the inhaler.  Pt husband looked at are on her back and thought it looked mild swollen but did not see rash.  Review of Systems  Constitutional: Positive for fatigue. Negative for chills and fever.  HENT: Negative for congestion, ear pain, nosebleeds and rhinorrhea.   Respiratory: Positive for cough. Negative for chest tightness, shortness of breath and wheezing.   Cardiovascular: Negative for chest pain and palpitations.  Gastrointestinal: Negative for abdominal pain.  Genitourinary: Negative for dyspareunia, dysuria and enuresis.  Musculoskeletal: Positive for back pain.  Skin:       Very sensitive skin over area of back.See hpi and exam.  Neurological: Negative for dizziness and headaches.  Hematological: Negative for adenopathy. Does not bruise/bleed easily.  Psychiatric/Behavioral: Negative for behavioral problems and confusion.    Past Medical History:  Diagnosis Date  . Adjustment disorder   . Anemia   . Anxiety   . Back pain   . Cancer ([place]) 2008   skin cancer, BCC  . Chest discomfort    at times  . Constipation 11/14/2014  . Depression   . Edema    feet and legs  . Generalized anxiety disorder   . GERD (gastroesophageal reflux disease)   . Headache 08/24/2015  . Heart valve problem    trivial leak in mitral valve    trivial leak in tricuspid  . Hyperlipidemia    Elevated, but never taken medication  . Hypertension   . Insomnia 08/24/2015    . Joint pain   . Obesity 11/14/2014  . OSA (obstructive sleep apnea) 09/12/2016  . Pain in joint, shoulder region 11/27/2015  . Palpitations   . Post-menopause 08/23/2015  . Preventative health care 08/23/2015  . Raynauds syndrome   . Sleep apnea    appt with pulmonary Dr in march 2018  . SOB (shortness of breath)   . Vitamin B12 deficiency 08/23/2015  . Vitamin D deficiency 08/23/2015     Social History   Social History  . Marital status: Married    Spouse name: Carmelia Tiner  . Number of children: 1  . Years of education: 61   Occupational History  . claims representative w/ [employer]    Social History Main Topics  . Smoking status: Never Smoker  . Smokeless tobacco: Never Used  . Alcohol use No  . Drug use: No  . Sexual activity: Yes    Partners: Male    Birth control/ protection: Post-menopausal     Comment: 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[place], [employer] lives with husband   Other Topics Concern  . Not on file   Social History Narrative  . No narrative on file    Past Surgical History:  Procedure Laterality Date  . ABDOMINAL HYSTERECTOMY  2010  . BREAST BIOPSY     right, 1996  . ECTOPIC PREGNANCY SURGERY    .  SHOULDER OPEN ROTATOR CUFF REPAIR  2017   right  . TUBAL LIGATION     on right  . TYMPANOSTOMY TUBE PLACEMENT      Family History  Problem Relation Age of Onset  . Heart disease Mother     CAD w/MI in mid 23s  . Diabetes Mother   . Depression Mother   . Hypertension Mother   . Hyperlipidemia Mother   . Liver disease Mother   . Sleep apnea Mother   . Obesity Mother   . Diabetes Father   . Cancer Maternal Grandmother     throat  . Heart disease Maternal Grandmother   . Cancer Paternal Grandmother     lung  . Heart disease Paternal Grandmother   . Hypertension Sister     as a teenager    Allergies  Allergen Reactions  . Oxycodone Itching  . Losartan Other (See Comments)    Diarrhea and abodminal cramping    Current  Outpatient Prescriptions on File Prior to Visit  Medication Sig Dispense Refill  . amLODipine (NORVASC) 5 MG tablet Take 1 tablet (5 mg total) by mouth daily. 30 tablet 0  . benzonatate (TESSALON) 100 MG capsule Take 1 capsule by mouth 3 (three) times daily.    . Cholecalciferol (VITAMIN D PO) Take 5,000 Units by mouth daily. Take one 4,000 IU po qd.     . doxycycline (VIBRA-TABS) 100 MG tablet Take 1 tablet (100 mg total) by mouth 2 (two) times daily. Can give caps or generic 20 tablet 0  . DULoxetine (CYMBALTA) 60 MG capsule Take 1 capsule by mouth daily.    . fluconazole (DIFLUCAN) 150 MG tablet Take 1 by mouth once a week for 2 weeks 2 tablet 0  . fluticasone (FLONASE) 50 MCG/ACT nasal spray Place 2 sprays into both nostrils daily. 16 g 1  . hydrochlorothiazide (HYDRODIURIL) 25 MG tablet Take 1 tablet (25 mg total) by mouth daily. 90 tablet 3  . KRILL OIL PO Take 1 capsule by mouth every morning.    Marland Kitchen levocetirizine (XYZAL) 5 MG tablet Take 1 tablet (5 mg total) by mouth every evening. 30 tablet 0  . LORazepam (ATIVAN) 0.5 MG tablet Take 0.5 mg by mouth daily.     . metFORMIN (GLUCOPHAGE) 500 MG tablet Take 1 tablet (500 mg total) by mouth daily with breakfast. (Patient taking differently: Take 250 mg by mouth daily with breakfast. Take 1/2 tab daily) 30 tablet 0  . metoprolol succinate (TOPROL-XL) 50 MG 24 hr tablet Take 1 tablet (50 mg total) by mouth daily. Take with or immediately following a meal. 30 tablet 5  . Vitamin D, Ergocalciferol, (DRISDOL) 50000 units CAPS capsule Take 1 capsule (50,000 Units total) by mouth every 7 (seven) days. 4 capsule 4   No current facility-administered medications on file prior to visit.     BP 118/75 (BP Location: Left Arm, Patient Position: Sitting, Cuff Size: Large)   Pulse 62   Temp 98.1 F (36.7 C) (Oral)   Resp 16   Ht 5\' 3"  (1.6 m)   Wt 202 lb 9.6 oz (91.9 kg)   SpO2 100%   BMI 35.89 kg/m       Objective:   Physical Exam  General-  No acute distress. Pleasant patient. Neck- Full range of motion, no jvd Lungs- Clear, even and unlabored. Heart- regular rate and rhythm. Neurologic- CNII- XII grossly intact.  Skin- on back exam. Just rt of lower tspine has very sensitive region.  Painful to light touch small area. Skin looks normal. No rash. No vesicles. No warmth. No induration.        Assessment & Plan:  With  your severe sensitivity to light touch and absence of muscle type injury or skin infection, I do think early shingles is on top of differential diagnosis list.  Will rx famvir and advise use ibuprofen low dose otc.  If your symptoms worsen or change please notify us.  Follow up in 7-10 days or as needed  Wayne Wicklund, Percell Miller, [employer]

## 2016-09-19 NOTE — Progress Notes (Signed)
Pre visit review using our clinic review tool, if applicable. No additional management support is needed unless otherwise documented below in the visit note. 

## 2016-09-19 NOTE — Patient Instructions (Addendum)
With  your severe sensitivity to light touch and absence of muscle type injury or skin infection, I do think early shingles is on top of differential diagnosis list.  Will rx famvir and advise use ibuprofen low dose otc.  If your symptoms worsen or change please notify us.  Follow up in 7-10 days or as needed

## 2016-09-25 ENCOUNTER — Telehealth: Payer: Self-pay

## 2016-09-25 ENCOUNTER — Encounter: Payer: Self-pay | Admitting: Podiatry

## 2016-09-25 ENCOUNTER — Ambulatory Visit (INDEPENDENT_AMBULATORY_CARE_PROVIDER_SITE_OTHER): Payer: Federal, State, Local not specified - PPO

## 2016-09-25 ENCOUNTER — Ambulatory Visit (INDEPENDENT_AMBULATORY_CARE_PROVIDER_SITE_OTHER): Payer: Federal, State, Local not specified - PPO | Admitting: Podiatry

## 2016-09-25 VITALS — BP 117/75 | HR 70 | Resp 16

## 2016-09-25 DIAGNOSIS — T148XXA Other injury of unspecified body region, initial encounter: Secondary | ICD-10-CM

## 2016-09-25 DIAGNOSIS — S99921A Unspecified injury of right foot, initial encounter: Secondary | ICD-10-CM | POA: Diagnosis not present

## 2016-09-25 MED ORDER — FAMCICLOVIR 500 MG PO TABS: 500.0000 mg | ORAL_TABLET | Freq: Three times a day (TID) | ORAL | 0 refills | Status: DC

## 2016-09-25 NOTE — Progress Notes (Signed)
Julie Bishop presents today for a chief complaint of pain to the right foot along the lateral side. She states that there is a knot on the outside she states that she rolled the foot about 2 months ago she was wearing high heels at work. She states the foot rolled over towards the outside and since that time she developed a very large painful knot.  Objective: Vital signs are stable she is alert and oriented 3. Pulses are palpable. She has moderate severe pain on a nonpulsatile nodule to the dorsal aspect of the fifth metatarsal of the right foot. This appears to be a ganglion that was present prior to any injury. Radiographs taken today do not demonstrate any type of osseus abnormalities in this area.  Assessment: Bony contusion with ganglion cyst right fifth metatarsal area right foot.  Plan: Place her in a [facility] and will follow up with her in 1 month.

## 2016-09-25 NOTE — Telephone Encounter (Signed)
Pt is requesting refill on Famciclovir. Please advise.

## 2016-09-25 NOTE — Telephone Encounter (Signed)
fx famvir sent to pt pharmacy. Let us know how she feels after 7 more days. What is going on right now. Does she have a rash or blister. Or just pain?

## 2016-09-26 NOTE — Telephone Encounter (Signed)
Patient called in response to a voicemail she received to call the office. Patient stated she never called to request a refill on Famciclovir because she has never taken it. Stated it was prescribed by Mackie Pai because he thought she had Shingles, but she never got the prescription and never took the medication so she does not know who called to ask for a refill. Information passed on to [facility] because patient stated this is who left the message.

## 2016-09-30 ENCOUNTER — Other Ambulatory Visit: Payer: Self-pay | Admitting: Family Medicine

## 2016-09-30 DIAGNOSIS — I10 Essential (primary) hypertension: Secondary | ICD-10-CM

## 2016-10-08 ENCOUNTER — Other Ambulatory Visit: Payer: Self-pay | Admitting: Family Medicine

## 2016-10-08 DIAGNOSIS — I10 Essential (primary) hypertension: Secondary | ICD-10-CM

## 2016-10-11 ENCOUNTER — Encounter (INDEPENDENT_AMBULATORY_CARE_PROVIDER_SITE_OTHER): Payer: Federal, State, Local not specified - PPO | Admitting: Podiatry

## 2016-10-11 NOTE — Progress Notes (Signed)
This encounter was created in error - please disregard.

## 2016-10-12 DIAGNOSIS — G4733 Obstructive sleep apnea (adult) (pediatric): Secondary | ICD-10-CM | POA: Diagnosis not present

## 2016-10-23 ENCOUNTER — Other Ambulatory Visit: Payer: Self-pay | Admitting: Family Medicine

## 2016-10-23 DIAGNOSIS — Z1231 Encounter for screening mammogram for malignant neoplasm of breast: Secondary | ICD-10-CM

## 2016-10-25 ENCOUNTER — Ambulatory Visit (HOSPITAL_BASED_OUTPATIENT_CLINIC_OR_DEPARTMENT_OTHER)
Admission: RE | Admit: 2016-10-25 | Discharge: 2016-10-25 | Disposition: A | Discharge status: Home / Self Care | Payer: Federal, State, Local not specified - PPO | Source: Ambulatory Visit | Attending: Family Medicine | Admitting: Family Medicine

## 2016-10-25 DIAGNOSIS — Z1231 Encounter for screening mammogram for malignant neoplasm of breast: Secondary | ICD-10-CM | POA: Diagnosis not present

## 2016-11-07 ENCOUNTER — Other Ambulatory Visit: Payer: Self-pay | Admitting: Family Medicine

## 2016-11-07 DIAGNOSIS — I10 Essential (primary) hypertension: Secondary | ICD-10-CM

## 2016-11-12 DIAGNOSIS — G4733 Obstructive sleep apnea (adult) (pediatric): Secondary | ICD-10-CM | POA: Diagnosis not present

## 2016-12-12 ENCOUNTER — Other Ambulatory Visit: Payer: Self-pay | Admitting: Family Medicine

## 2016-12-12 DIAGNOSIS — G4733 Obstructive sleep apnea (adult) (pediatric): Secondary | ICD-10-CM | POA: Diagnosis not present

## 2016-12-12 DIAGNOSIS — I10 Essential (primary) hypertension: Secondary | ICD-10-CM

## 2017-01-12 DIAGNOSIS — G4733 Obstructive sleep apnea (adult) (pediatric): Secondary | ICD-10-CM | POA: Diagnosis not present

## 2017-01-22 ENCOUNTER — Other Ambulatory Visit: Payer: Self-pay | Admitting: Family Medicine

## 2017-01-22 DIAGNOSIS — I10 Essential (primary) hypertension: Secondary | ICD-10-CM

## 2017-01-25 ENCOUNTER — Other Ambulatory Visit: Payer: Self-pay | Admitting: Family Medicine

## 2017-01-25 ENCOUNTER — Encounter: Payer: Self-pay | Admitting: Family Medicine

## 2017-01-25 ENCOUNTER — Ambulatory Visit (INDEPENDENT_AMBULATORY_CARE_PROVIDER_SITE_OTHER): Payer: Federal, State, Local not specified - PPO | Admitting: Family Medicine

## 2017-01-25 DIAGNOSIS — F4322 Adjustment disorder with anxiety: Secondary | ICD-10-CM | POA: Diagnosis not present

## 2017-01-25 DIAGNOSIS — F4320 Adjustment disorder, unspecified: Secondary | ICD-10-CM | POA: Insufficient documentation

## 2017-01-25 DIAGNOSIS — Z6839 Body mass index (BMI) 39.0-39.9, adult: Secondary | ICD-10-CM | POA: Diagnosis not present

## 2017-01-25 DIAGNOSIS — E6609 Other obesity due to excess calories: Secondary | ICD-10-CM | POA: Diagnosis not present

## 2017-01-25 DIAGNOSIS — E782 Mixed hyperlipidemia: Secondary | ICD-10-CM | POA: Diagnosis not present

## 2017-01-25 DIAGNOSIS — I10 Essential (primary) hypertension: Secondary | ICD-10-CM | POA: Diagnosis not present

## 2017-01-25 DIAGNOSIS — K219 Gastro-esophageal reflux disease without esophagitis: Secondary | ICD-10-CM | POA: Diagnosis not present

## 2017-01-25 DIAGNOSIS — G4733 Obstructive sleep apnea (adult) (pediatric): Secondary | ICD-10-CM

## 2017-01-25 HISTORY — DX: Adjustment disorder, unspecified: F43.20

## 2017-01-25 MED ORDER — RANITIDINE HCL 300 MG PO TABS: 300.0000 mg | ORAL_TABLET | Freq: Every evening | ORAL | 3 refills | Status: DC | PRN

## 2017-01-25 MED ORDER — AMLODIPINE BESYLATE 5 MG PO TABS: 5.0000 mg | ORAL_TABLET | Freq: Every day | ORAL | 0 refills | Status: DC

## 2017-01-25 NOTE — Assessment & Plan Note (Signed)
Using CPAP daily 

## 2017-01-25 NOTE — Patient Instructions (Addendum)
Clear fluids x 24 hours then a bland diet as tolerated MIND or DASH diet  Food Choices to Help Relieve Diarrhea, Adult When you have diarrhea, the foods you eat and your eating habits are very important. Choosing the right foods and drinks can help:  Relieve diarrhea.  Replace lost fluids and nutrients.  Prevent dehydration.  What general guidelines should I follow? Relieving diarrhea  Choose foods with less than 2 g or .07 oz. of fiber per serving.  Limit fats to less than 8 tsp (38 g or 1.34 oz.) a day.  Avoid the following: ? Foods and beverages sweetened with high-fructose corn syrup, honey, or sugar alcohols such as xylitol, sorbitol, and mannitol. ? Foods that contain a lot of fat or sugar. ? Fried, greasy, or spicy foods. ? High-fiber grains, breads, and cereals. ? Raw fruits and vegetables.  Eat foods that are rich in probiotics. These foods include dairy products such as yogurt and fermented milk products. They help increase healthy bacteria in the stomach and intestines (gastrointestinal tract, or GI tract).  If you have lactose intolerance, avoid dairy products. These may make your diarrhea worse.  Take medicine to help stop diarrhea (antidiarrheal medicine) only as told by your health care provider. Replacing nutrients  Eat small meals or snacks every 3-4 hours.  Eat bland foods, such as white rice, toast, or baked potato, until your diarrhea starts to get better. Gradually reintroduce nutrient-rich foods as tolerated or as told by your health care provider. This includes: ? Well-cooked protein foods. ? Peeled, seeded, and soft-cooked fruits and vegetables. ? Low-fat dairy products.  Take vitamin and mineral supplements as told by your health care provider. Preventing dehydration   Start by sipping water or a special solution to prevent dehydration (oral rehydration solution, ORS). Urine that is clear or pale yellow means that you are getting enough  fluid.  Try to drink at least 8-10 cups of fluid each day to help replace lost fluids.  You may add other liquids in addition to water, such as clear juice or decaffeinated sports drinks, as tolerated or as told by your health care provider.  Avoid drinks with caffeine, such as coffee, tea, or soft drinks.  Avoid alcohol. What foods are recommended? The items listed may not be a complete list. Talk with your health care provider about what dietary choices are best for you. Grains White rice. White, [place], or pita breads (fresh or toasted), including plain rolls, buns, or bagels. White pasta. Saltine, soda, or graham crackers. Pretzels. Low-fiber cereal. Cooked cereals made with water (such as cornmeal, farina, or cream cereals). Plain muffins. Matzo. Melba toast. Zwieback. Vegetables Potatoes (without the skin). Most well-cooked and canned vegetables without skins or seeds. Tender lettuce. Fruits Apple sauce. Fruits canned in juice. Cooked apricots, cherries, grapefruit, peaches, pears, or plums. Fresh bananas and cantaloupe. Meats and other protein foods Baked or boiled chicken. Eggs. Tofu. Fish. Seafood. Smooth nut butters. Ground or well-cooked tender beef, ham, veal, lamb, pork, or poultry. Dairy Plain yogurt, kefir, and unsweetened liquid yogurt. Lactose-free milk, buttermilk, skim milk, or soy milk. Low-fat or nonfat hard cheese. Beverages Water. Low-calorie sports drinks. Fruit juices without pulp. Strained tomato and vegetable juices. Decaffeinated teas. Sugar-free beverages not sweetened with sugar alcohols. Oral rehydration solutions, if approved by your health care provider. Seasoning and other foods Bouillon, broth, or soups made from recommended foods. What foods are not recommended? The items listed may not be a complete list. Talk with your  health care provider about what dietary choices are best for you. Grains Whole grain, whole wheat, bran, or rye breads, rolls, pastas,  and crackers. Wild or brown rice. Whole grain or bran cereals. Barley. Oats and oatmeal. Corn tortillas or taco shells. Granola. Popcorn. Vegetables Raw vegetables. Fried vegetables. Cabbage, broccoli, Brussels sprouts, artichokes, baked beans, beet greens, corn, kale, legumes, peas, sweet potatoes, and yams. Potato skins. Cooked spinach and cabbage. Fruits Dried fruit, including raisins and dates. Raw fruits. Stewed or dried prunes. Canned fruits with syrup. Meat and other protein foods Fried or fatty meats. Deli meats. Chunky nut butters. Nuts and seeds. Beans and lentils. Berniece Salines. Hot dogs. Sausage. Dairy High-fat cheeses. Whole milk, chocolate milk, and beverages made with milk, such as milk shakes. Half-and-half. Cream. sour cream. Ice cream. Beverages Caffeinated beverages (such as coffee, tea, soda, or energy drinks). Alcoholic beverages. Fruit juices with pulp. Prune juice. Soft drinks sweetened with high-fructose corn syrup or sugar alcohols. High-calorie sports drinks. Fats and oils Butter. Cream sauces. Margarine. Salad oils. Plain salad dressings. Olives. Avocados. Mayonnaise. Sweets and desserts Sweet rolls, doughnuts, and sweet breads. Sugar-free desserts sweetened with sugar alcohols such as xylitol and sorbitol. Seasoning and other foods Honey. Hot sauce. Chili powder. Gravy. Cream-based or milk-based soups. Pancakes and waffles. Summary  When you have diarrhea, the foods you eat and your eating habits are very important.  Make sure you get at least 8-10 cups of fluid each day, or enough to keep your urine clear or pale yellow.  Eat bland foods and gradually reintroduce healthy, nutrient-rich foods as tolerated, or as told by your health care provider.  Avoid high-fiber, fried, greasy, or spicy foods. This information is not intended to replace advice given to you by your health care provider. Make sure you discuss any questions you have with your health care  provider. Document Released: 08/04/2003 Document Revised: 05/11/2016 Document Reviewed: 05/11/2016 Elsevier Interactive Patient Education  2017 [employer].

## 2017-01-25 NOTE — Progress Notes (Signed)
Pre visit review using our clinic review tool, if applicable. No additional management support is needed unless otherwise documented below in the visit note. 

## 2017-01-25 NOTE — Assessment & Plan Note (Signed)
Well controlled, no changes to meds. Encouraged heart healthy diet such as the DASH diet and exercise as tolerated.  °

## 2017-01-25 NOTE — Assessment & Plan Note (Signed)
Encouraged heart healthy diet, increase exercise, avoid trans fats, consider a krill oil cap daily 

## 2017-01-25 NOTE — Assessment & Plan Note (Signed)
Encouraged DASH diet, decrease po intake and increase exercise as tolerated. Needs 7-8 hours of sleep nightly. Avoid trans fats, eat small, frequent meals every 4-5 hours with lean proteins, complex carbs and healthy fats. Minimize simple carbs, tried bariatric referral

## 2017-01-25 NOTE — Telephone Encounter (Signed)
Caller name: Jamesina Relation to pt: self  Call back number: [phone] Pharmacy: [facility] at [place], [place]  Reason for call: Pt was seen today and forgot to mention needs refill on amLODipine (NORVASC) 5 MG tablet. Pt stated does not have meds left.  Please advice.

## 2017-01-25 NOTE — Assessment & Plan Note (Addendum)
complaining of globus and discomfort when swallowing especially diet coke. Encouraged to stop drinking diet coke and started on Ranitidine.

## 2017-01-25 NOTE — Assessment & Plan Note (Addendum)
Is seeing PA, Donnal Moat, at psychiatry at [facility] using Duloxetine and Lorazepam.

## 2017-01-25 NOTE — Telephone Encounter (Signed)
Medication has been refilled.

## 2017-01-28 NOTE — Progress Notes (Signed)
Patient ID: Julie Bishop, female   DOB: Sep 30, 1970, 46 y.o.   MRN: [id number]   Subjective:    Patient ID: Julie Bishop, female    DOB: 1970/12/23, 46 y.o.   MRN: [id number]  Chief Complaint  Patient presents with  . Follow-up  . Hypertension    HPI Patient is in today for follow up . She has been having trouble with reflux and even a sore throat at times. Has a sense of globus and it hurts to swallow at times. Diet coke is the worst and can even hurt to her back. No recent febrile illness or hospitalizations. No change in appetite, constipation or diarrhea. No nausea or vomiting. Denies CP/palp/SOB/HA/congestion/fevers or GU c/o. Taking meds as prescribed  Past Medical History:  Diagnosis Date  . Adjustment disorder   . Adjustment disorder 01/25/2017  . Anemia   . Anxiety   . Back pain   . Cancer ([place]) 2008   skin cancer, BCC  . Chest discomfort    at times  . Constipation 11/14/2014  . Depression   . Edema    feet and legs  . Generalized anxiety disorder   . GERD (gastroesophageal reflux disease)   . Headache 08/24/2015  . Heart valve problem    trivial leak in mitral valve    trivial leak in tricuspid  . Hyperlipidemia    Elevated, but never taken medication  . Hypertension   . Insomnia 08/24/2015  . Joint pain   . Obesity 11/14/2014  . OSA (obstructive sleep apnea) 09/12/2016  . Pain in joint, shoulder region 11/27/2015  . Palpitations   . Post-menopause 08/23/2015  . Preventative health care 08/23/2015  . Raynauds syndrome   . Sleep apnea    appt with pulmonary Dr in march 2018  . SOB (shortness of breath)   . Vitamin B12 deficiency 08/23/2015  . Vitamin D deficiency 08/23/2015    Past Surgical History:  Procedure Laterality Date  . ABDOMINAL HYSTERECTOMY  2010  . BREAST BIOPSY     right, 1996  . ECTOPIC PREGNANCY SURGERY    . SHOULDER OPEN ROTATOR CUFF REPAIR  2017   right  . TUBAL LIGATION     on right  . TYMPANOSTOMY TUBE PLACEMENT       Family History  Problem Relation Age of Onset  . Heart disease Mother        CAD w/MI in mid 39s  . Diabetes Mother   . Depression Mother   . Hypertension Mother   . Hyperlipidemia Mother   . Liver disease Mother   . Sleep apnea Mother   . Obesity Mother   . Diabetes Father   . Cancer Maternal Grandmother        throat  . Heart disease Maternal Grandmother   . Cancer Paternal Grandmother        lung  . Heart disease Paternal Grandmother   . Hypertension Sister        as a teenager    Social History   Social History  . Marital status: Married    Spouse name: Melba Araki  . Number of children: 1  . Years of education: 31   Occupational History  . claims representative w/ [employer]    Social History Main Topics  . Smoking status: Never Smoker  . Smokeless tobacco: Never Used  . Alcohol use No  . Drug use: No  . Sexual activity: Yes    Partners: Male    Birth  control/ protection: Post-menopausal     Comment: works at [employer] off in [place], [employer] lives with husband   Other Topics Concern  . Not on file   Social History Narrative  . No narrative on file    Outpatient Medications Prior to Visit  Medication Sig Dispense Refill  . DULoxetine (CYMBALTA) 60 MG capsule Take 1 capsule by mouth daily.    . hydrochlorothiazide (HYDRODIURIL) 25 MG tablet TAKE ONE TABLET BY MOUTH DAILY 30 tablet 4  . LORazepam (ATIVAN) 0.5 MG tablet Take 0.5 mg by mouth daily.     . metoprolol succinate (TOPROL-XL) 50 MG 24 hr tablet TAKE 1 TABLET BY MOUTH DAILY. TAKE WITH, OR IMMEDIATELY FOLLOWING, A MEAL. 30 tablet 4  . amLODipine (NORVASC) 5 MG tablet Take 1 tablet (5 mg total) by mouth daily. 30 tablet 0  . Cholecalciferol (VITAMIN D PO) Take 5,000 Units by mouth daily. Take one 4,000 IU po qd.     . famciclovir (FAMVIR) 500 MG tablet Take 1 tablet (500 mg total) by mouth 3 (three) times daily. 21 tablet 0  . fluticasone (FLONASE) 50 MCG/ACT nasal spray  Place 2 sprays into both nostrils daily. 16 g 1  . hydrochlorothiazide (HYDRODIURIL) 25 MG tablet Take 1 tablet (25 mg total) by mouth daily. 90 tablet 3  . KRILL OIL PO Take 1 capsule by mouth every morning.    Marland Kitchen levocetirizine (XYZAL) 5 MG tablet Take 1 tablet (5 mg total) by mouth every evening. 30 tablet 0  . metFORMIN (GLUCOPHAGE) 500 MG tablet Take 1 tablet (500 mg total) by mouth daily with breakfast. (Patient taking differently: Take 250 mg by mouth daily with breakfast. Take 1/2 tab daily) 30 tablet 0  . Vitamin D, Ergocalciferol, (DRISDOL) 50000 units CAPS capsule Take 1 capsule (50,000 Units total) by mouth every 7 (seven) days. 4 capsule 4   No facility-administered medications prior to visit.     Allergies  Allergen Reactions  . Oxycodone Itching  . Losartan Other (See Comments)    Diarrhea and abodminal cramping    Review of Systems  Constitutional: Negative for fever and malaise/fatigue.  HENT: Positive for sore throat. Negative for congestion.   Eyes: Negative for blurred vision.  Respiratory: Positive for hemoptysis. Negative for shortness of breath.   Cardiovascular: Negative for chest pain, palpitations and leg swelling.  Gastrointestinal: Positive for heartburn. Negative for abdominal pain, blood in stool and nausea.  Genitourinary: Negative for dysuria and frequency.  Musculoskeletal: Positive for back pain. Negative for falls.  Skin: Negative for rash.  Neurological: Negative for dizziness, loss of consciousness and headaches.  Endo/Heme/Allergies: Negative for environmental allergies.  Psychiatric/Behavioral: Negative for depression. The patient is not nervous/anxious.        Objective:    Physical Exam  Constitutional: She is oriented to person, place, and time. She appears well-developed and well-nourished. No distress.  HENT:  Head: Normocephalic and atraumatic.  Nose: Nose normal.  Eyes: Right eye exhibits no discharge. Left eye exhibits no  discharge.  Neck: Normal range of motion. Neck supple.  Cardiovascular: Normal rate and regular rhythm.   No murmur heard. Pulmonary/Chest: Effort normal and breath sounds normal.  Abdominal: Soft. Bowel sounds are normal. There is no tenderness.  Genitourinary: No vaginal discharge found.  Musculoskeletal: She exhibits no edema.  Neurological: She is alert and oriented to person, place, and time.  Skin: Skin is warm and dry.  Psychiatric: She has a normal mood and affect.  Nursing  note and vitals reviewed.   BP 130/84   Pulse 73   Temp 98.2 F (36.8 C) (Oral)   Ht 5\' 3"  (1.6 m)   Wt 221 lb 12.8 oz (100.6 kg)   SpO2 95%   BMI 39.29 kg/m  Wt Readings from Last 3 Encounters:  01/25/17 221 lb 12.8 oz (100.6 kg)  09/19/16 202 lb 9.6 oz (91.9 kg)  09/14/16 200 lb 4 oz (90.8 kg)     Lab Results  Component Value Date   WBC 6.4 07/12/2016   HGB 13.1 07/12/2016   HCT 37.7 07/12/2016   PLT 272.0 06/28/2016   GLUCOSE 88 07/12/2016   CHOL 226 (H) 07/12/2016   TRIG 93 07/12/2016   HDL 73 07/12/2016   LDLCALC 134 (H) 07/12/2016   ALT 17 07/12/2016   AST 19 07/12/2016   NA 140 07/12/2016   K 4.0 07/12/2016   CL 95 (L) 07/12/2016   CREATININE 0.61 07/12/2016   BUN 14 07/12/2016   CO2 27 07/12/2016   TSH 1.340 07/12/2016   HGBA1C 5.1 07/12/2016    Lab Results  Component Value Date   TSH 1.340 07/12/2016   Lab Results  Component Value Date   WBC 6.4 07/12/2016   HGB 13.1 07/12/2016   HCT 37.7 07/12/2016   MCV 83 07/12/2016   PLT 272.0 06/28/2016   Lab Results  Component Value Date   NA 140 07/12/2016   K 4.0 07/12/2016   CO2 27 07/12/2016   GLUCOSE 88 07/12/2016   BUN 14 07/12/2016   CREATININE 0.61 07/12/2016   BILITOT 0.3 07/12/2016   ALKPHOS 59 07/12/2016   AST 19 07/12/2016   ALT 17 07/12/2016   PROT 7.5 07/12/2016   ALBUMIN 4.4 07/12/2016   CALCIUM 9.6 07/12/2016   GFR 112.62 06/28/2016   Lab Results  Component Value Date   CHOL 226 (H)  07/12/2016   Lab Results  Component Value Date   HDL 73 07/12/2016   Lab Results  Component Value Date   LDLCALC 134 (H) 07/12/2016   Lab Results  Component Value Date   TRIG 93 07/12/2016   Lab Results  Component Value Date   CHOLHDL 3 06/28/2016   Lab Results  Component Value Date   HGBA1C 5.1 07/12/2016       Assessment & Plan:   Problem List Items Addressed This Visit    GERD (gastroesophageal reflux disease)    complaining of globus and discomfort when swallowing especially diet coke. Encouraged to stop drinking diet coke and started on Ranitidine.       Relevant Medications   ranitidine (ZANTAC) 300 MG tablet   Hyperlipidemia    Encouraged heart healthy diet, increase exercise, avoid trans fats, consider a krill oil cap daily      Hypertension    Well controlled, no changes to meds. Encouraged heart healthy diet such as the DASH diet and exercise as tolerated.       Obesity    Encouraged DASH diet, decrease po intake and increase exercise as tolerated. Needs 7-8 hours of sleep nightly. Avoid trans fats, eat small, frequent meals every 4-5 hours with lean proteins, complex carbs and healthy fats. Minimize simple carbs, tried bariatric referral      OSA (obstructive sleep apnea)    Using CPAP daily      Adjustment disorder    Is seeing PA, Donnal Moat, at psychiatry at [facility] using Duloxetine and Lorazepam.          I  have discontinued Ms. Mcroberts's Cholecalciferol (VITAMIN D PO), Vitamin D (Ergocalciferol), KRILL OIL PO, metFORMIN, levocetirizine, fluticasone, and famciclovir. I am also having her start on ranitidine. Additionally, I am having her maintain her DULoxetine, LORazepam, hydrochlorothiazide, and metoprolol succinate.  Meds ordered this encounter  Medications  . ranitidine (ZANTAC) 300 MG tablet    Sig: Take 1 tablet (300 mg total) by mouth at bedtime as needed for heartburn.    Dispense:  30 tablet    Refill:  3     Penni Homans, MD

## 2017-02-12 DIAGNOSIS — G4733 Obstructive sleep apnea (adult) (pediatric): Secondary | ICD-10-CM | POA: Diagnosis not present

## 2017-03-14 DIAGNOSIS — G4733 Obstructive sleep apnea (adult) (pediatric): Secondary | ICD-10-CM | POA: Diagnosis not present

## 2017-03-19 ENCOUNTER — Ambulatory Visit (INDEPENDENT_AMBULATORY_CARE_PROVIDER_SITE_OTHER): Payer: Federal, State, Local not specified - PPO | Admitting: Podiatry

## 2017-03-19 DIAGNOSIS — M6740 Ganglion, unspecified site: Secondary | ICD-10-CM | POA: Diagnosis not present

## 2017-03-19 NOTE — Patient Instructions (Signed)
Pre-Operative Instructions  Congratulations, you have decided to take an important step towards improving your quality of life.  You can be assured that the doctors and staff at [facility] will be with you every step of the way.  Here are some important things you should know:  1. Plan to be at the surgery center/hospital at least 1 (one) hour prior to your scheduled time, unless otherwise directed by the surgical center/hospital staff.  You must have a responsible adult accompany you, remain during the surgery and drive you home.  Make sure you have directions to the surgical center/hospital to ensure you arrive on time. 2. If you are having surgery at [facility] or [facility] hospitals, you will need a copy of your medical history and physical form from your family physician within one month prior to the date of surgery. We will give you a form for your primary physician to complete.  3. We make every effort to accommodate the date you request for surgery.  However, there are times where surgery dates or times have to be moved.  We will contact you as soon as possible if a change in schedule is required.   4. No aspirin/ibuprofen for one week before surgery.  If you are on aspirin, any non-steroidal anti-inflammatory medications (Mobic, Aleve, Ibuprofen) should not be taken seven (7) days prior to your surgery.  You make take Tylenol for pain prior to surgery.  5. Medications - If you are taking daily heart and blood pressure medications, seizure, reflux, allergy, asthma, anxiety, pain or diabetes medications, make sure you notify the surgery center/hospital before the day of surgery so they can tell you which medications you should take or avoid the day of surgery. 6. No food or drink after midnight the night before surgery unless directed otherwise by surgical center/hospital staff. 7. No alcoholic beverages 24-hours prior to surgery.  No smoking 24-hours prior or 24-hours after  surgery. 8. Wear loose pants or shorts. They should be loose enough to fit over bandages, boots, and casts. 9. Don't wear slip-on shoes. Sneakers are preferred. 10. Bring your boot with you to the surgery center/hospital.  Also bring crutches or a walker if your physician has prescribed it for you.  If you do not have this equipment, it will be provided for you after surgery. 11. If you have not been contacted by the surgery center/hospital by the day before your surgery, call to confirm the date and time of your surgery. 12. Leave-time from work may vary depending on the type of surgery you have.  Appropriate arrangements should be made prior to surgery with your employer. 13. Prescriptions will be provided immediately following surgery by your doctor.  Fill these as soon as possible after surgery and take the medication as directed. Pain medications will not be refilled on weekends and must be approved by the doctor. 14. Remove nail polish on the operative foot and avoid getting pedicures prior to surgery. 15. Wash the night before surgery.  The night before surgery wash the foot and leg well with water and the antibacterial soap provided. Be sure to pay special attention to beneath the toenails and in between the toes.  Wash for at least three (3) minutes. Rinse thoroughly with water and dry well with a towel.  Perform this wash unless told not to do so by your physician.  Enclosed: 1 Ice pack (please put in freezer the night before surgery)   1 Hibiclens skin cleaner     Pre-op instructions  If you have any questions regarding the instructions, please do not hesitate to call our office.  [place]: [address], [place], [place] [address] -- [phone]  [place]: [address] [place], [place] [address] -- [phone]  [place]: [address]  [place], [place] [address] -- [phone]  [place]: [address], [address], [place], [place] [address] -- [phone]  Website:  [email or web address] 

## 2017-03-20 DIAGNOSIS — F331 Major depressive disorder, recurrent, moderate: Secondary | ICD-10-CM | POA: Diagnosis not present

## 2017-03-20 NOTE — Progress Notes (Signed)
She presents today for follow-up of a ganglion to her right foot. She states that is really starting to grow and bother me. She states that her mother is about to have foot surgery she's going have to take care of her or she would go ahead and have the cyst removed herself. She would like to consider surgical intervention in the near future once her mother is well-healed.  Objective: Vital signs are stable she is alert and oriented 3. Pulses are palpable. She has a relatively large ganglion cyst of the lateral aspect of the right foot measuring 4 cm x 4 cm x 1.5 cm. The area was aspirated today after local anesthesia was achieved and Betadine was used for prepping the skin. One and a half cc of volume was removed this appeared to be typical ganglion material one small amount of blood.  Assessment: Ganglion cyst right foot.  Plan: We went over a consent form today line by line number by number given her ample time to ask questions she saw fit regarding excision of this tumor. She understands this and is amenable to it we did discuss the possible postop complications which may include but are not limited to postop pain bleeding swelling infection recurrence need for further surgery overcorrection and under correction.  We also aspirated the lesion today and place compression dressing. Follow up with her in the near future for surgery.

## 2017-04-03 ENCOUNTER — Other Ambulatory Visit: Payer: Self-pay | Admitting: Family Medicine

## 2017-04-03 DIAGNOSIS — I10 Essential (primary) hypertension: Secondary | ICD-10-CM

## 2017-04-14 DIAGNOSIS — G4733 Obstructive sleep apnea (adult) (pediatric): Secondary | ICD-10-CM | POA: Diagnosis not present

## 2017-04-22 ENCOUNTER — Ambulatory Visit: Payer: Federal, State, Local not specified - PPO | Admitting: Family Medicine

## 2017-04-22 ENCOUNTER — Telehealth: Payer: Self-pay | Admitting: Family Medicine

## 2017-04-22 NOTE — Telephone Encounter (Signed)
OK with me.

## 2017-04-22 NOTE — Telephone Encounter (Signed)
Pt had to reschedule her follow up apt because she is currently taking care of her mother. Pt would like to know if she could reschedule apt to 05/02/17 at 4p instead. Dr. Charlett Blake please advise for scheduling?

## 2017-04-23 NOTE — Telephone Encounter (Signed)
Great. Pt has been scheduled.  °

## 2017-05-01 DIAGNOSIS — F4322 Adjustment disorder with anxiety: Secondary | ICD-10-CM | POA: Diagnosis not present

## 2017-05-02 ENCOUNTER — Ambulatory Visit: Payer: Federal, State, Local not specified - PPO | Admitting: Family Medicine

## 2017-05-02 ENCOUNTER — Encounter: Payer: Self-pay | Admitting: Family Medicine

## 2017-05-02 DIAGNOSIS — E782 Mixed hyperlipidemia: Secondary | ICD-10-CM | POA: Diagnosis not present

## 2017-05-02 DIAGNOSIS — E559 Vitamin D deficiency, unspecified: Secondary | ICD-10-CM

## 2017-05-02 DIAGNOSIS — E1169 Type 2 diabetes mellitus with other specified complication: Secondary | ICD-10-CM | POA: Insufficient documentation

## 2017-05-02 DIAGNOSIS — I10 Essential (primary) hypertension: Secondary | ICD-10-CM

## 2017-05-02 DIAGNOSIS — E538 Deficiency of other specified B group vitamins: Secondary | ICD-10-CM

## 2017-05-02 DIAGNOSIS — R739 Hyperglycemia, unspecified: Secondary | ICD-10-CM | POA: Diagnosis not present

## 2017-05-02 NOTE — Assessment & Plan Note (Addendum)
Check a level today, noted to be low. Labs reveal deficiency. Start on Vitamin D 50000 IU caps, 1 cap po weekly x 12 weeks. Disp #4 with 4 rf. Also take daily Vitamin D over the counter. If already taking a daily supplement increase by 1000 IU daily and if not start Vitamin D 2000 IU daily.

## 2017-05-02 NOTE — Progress Notes (Signed)
Subjective:  I acted as a Education administrator for [employer]. Yancey Flemings, [place]   Patient ID: Julie Bishop, female    DOB: [birth year], 46 y.o.   MRN: [id number]  Chief Complaint  Patient presents with  . Follow-up    HPI  Patient is in today for follow up and she is feeling well today. No recent febrille illness or hospitalizations. Well controlled, no changes to meds. Denies CP/palp/SOB/HA/congestion/fevers/GI c/o. Taking meds as prescribed. Does note some urinary urgency, frequency and fould smeel this week.   Patient Care Team: [name], MD as PCP - General (Family Medicine)   Past Medical History:  Diagnosis Date  . Adjustment disorder   . Adjustment disorder 01/25/2017  . Anemia   . Anxiety   . Back pain   . Cancer ([place]) 2008   skin cancer, BCC  . Chest discomfort    at times  . Constipation 11/14/2014  . Depression   . Edema    feet and legs  . Generalized anxiety disorder   . GERD (gastroesophageal reflux disease)   . Headache 08/24/2015  . Heart valve problem    trivial leak in mitral valve    trivial leak in tricuspid  . Hyperlipidemia    Elevated, but never taken medication  . Hypertension   . Insomnia 08/24/2015  . Joint pain   . Obesity 11/14/2014  . OSA (obstructive sleep apnea) 09/12/2016  . Pain in joint, shoulder region 11/27/2015  . Palpitations   . Post-menopause 08/23/2015  . Preventative health care 08/23/2015  . Raynauds syndrome   . Sleep apnea    appt with pulmonary Dr in march 2018  . SOB (shortness of breath)   . Vitamin B12 deficiency 08/23/2015  . Vitamin D deficiency 08/23/2015    Past Surgical History:  Procedure Laterality Date  . ABDOMINAL HYSTERECTOMY  2010  . BREAST BIOPSY     right, 1996  . ECTOPIC PREGNANCY SURGERY    . SHOULDER OPEN ROTATOR CUFF REPAIR  2017   right  . TUBAL LIGATION     on right  . TYMPANOSTOMY TUBE PLACEMENT      Family History  Problem Relation Age of Onset  . Heart disease Mother        CAD w/MI in mid  69s  . Diabetes Mother   . Depression Mother   . Hypertension Mother   . Hyperlipidemia Mother   . Liver disease Mother   . Sleep apnea Mother   . Obesity Mother   . Diabetes Father   . Cancer Maternal Grandmother        throat  . Heart disease Maternal Grandmother   . Cancer Paternal Grandmother        lung  . Heart disease Paternal Grandmother   . Hypertension Sister        as a teenager    Social History   Socioeconomic History  . Marital status: Married    Spouse name: Janaki Exley  . Number of children: 1  . Years of education: 26  . Highest education level: Not on file  Social Needs  . Financial resource strain: Not on file  . Food insecurity - worry: Not on file  . Food insecurity - inability: Not on file  . Transportation needs - medical: Not on file  . Transportation needs - non-medical: Not on file  Occupational History  . 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[employer]  Tobacco Use  . Smoking status: Never Smoker  .  Smokeless tobacco: Never Used  Substance and Sexual Activity  . Alcohol use: No    Alcohol/week: 0.0 oz  . Drug use: No  . 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 Not on file  Social History Narrative  . Not on file    Outpatient Medications Prior to Visit  Medication Sig Dispense Refill  . amLODipine (NORVASC) 5 MG tablet Take 1 tablet (5 mg total) by mouth daily. 90 tablet 0  . DULoxetine (CYMBALTA) 60 MG capsule Take 1 capsule by mouth daily.    . hydrochlorothiazide (HYDRODIURIL) 25 MG tablet TAKE ONE TABLET BY MOUTH DAILY 30 tablet 3  . LORazepam (ATIVAN) 0.5 MG tablet Take 0.5 mg by mouth daily.     . metoprolol succinate (TOPROL-XL) 50 MG 24 hr tablet TAKE 1 TABLET BY MOUTH DAILY. TAKE WITH, OR IMMEDIATELY FOLLOWING, A MEAL. 30 tablet 3  . ranitidine (ZANTAC) 300 MG  tablet Take 1 tablet (300 mg total) by mouth at bedtime as needed for heartburn. 30 tablet 3   No facility-administered medications prior to visit.     Allergies  Allergen Reactions  . Oxycodone Itching  . Losartan Other (See Comments)    Diarrhea and abodminal cramping    Review of Systems  Constitutional: Negative for fever and malaise/fatigue.  HENT: Negative for congestion.   Eyes: Negative for blurred vision.  Respiratory: Negative for shortness of breath.   Cardiovascular: Negative for chest pain, palpitations and leg swelling.  Gastrointestinal: Negative for abdominal pain, blood in stool and nausea.  Genitourinary: Positive for frequency and urgency. Negative for dysuria.  Musculoskeletal: Negative for falls.  Skin: Negative for rash.  Neurological: Negative for dizziness, loss of consciousness and headaches.  Endo/Heme/Allergies: Positive for polydipsia. Negative for environmental allergies.  Psychiatric/Behavioral: Negative for depression. The patient is not nervous/anxious.        Objective:    Physical Exam  Constitutional: She is oriented to person, place, and time. She appears well-developed and well-nourished. No distress.  HENT:  Head: Normocephalic and atraumatic.  Nose: Nose normal.  Eyes: Right eye exhibits no discharge. Left eye exhibits no discharge.  Neck: Normal range of motion. Neck supple.  Cardiovascular: Normal rate and regular rhythm.  No murmur heard. Pulmonary/Chest: Effort normal and breath sounds normal.  Abdominal: Soft. Bowel sounds are normal. There is no tenderness.  Musculoskeletal: She exhibits no edema.  Neurological: She is alert and oriented to person, place, and time.  Skin: Skin is warm and dry.  Psychiatric: She has a normal mood and affect.  Nursing note and vitals reviewed.   BP 132/87 (BP Location: Left Arm, Patient Position: Sitting, Cuff Size: Large)   Pulse 74   Temp 98.8 F (37.1 C) (Oral)   Resp 16   Ht 5' 2.99"  (1.6 m)   Wt 229 lb 6.4 oz (104.1 kg)   BMI 40.65 kg/m  Wt Readings from Last 3 Encounters:  05/02/17 229 lb 6.4 oz (104.1 kg)  01/25/17 221 lb 12.8 oz (100.6 kg)  09/19/16 202 lb 9.6 oz (91.9 kg)   BP Readings from Last 3 Encounters:  05/02/17 132/87  01/25/17 130/84  09/25/16 117/75     Immunization History  Administered Date(s) Administered  . Tdap 11/15/2015    Health Maintenance  Topic Date Due  . HIV Screening  04/07/1986  .  PAP SMEAR  08/26/2016  . INFLUENZA VACCINE  01/02/2018 (Originally 12/26/2016)  . MAMMOGRAM  10/25/2017  . TETANUS/TDAP  11/14/2025    Lab Results  Component Value Date   WBC 8.8 05/02/2017   HGB 13.0 05/02/2017   HCT 38.2 05/02/2017   PLT 344.0 05/02/2017   GLUCOSE 81 05/02/2017   CHOL 212 (H) 05/02/2017   TRIG 118.0 05/02/2017   HDL 76.70 05/02/2017   LDLCALC 111 (H) 05/02/2017   ALT 20 05/02/2017   AST 17 05/02/2017   NA 137 05/02/2017   K 3.9 05/02/2017   CL 98 05/02/2017   CREATININE 0.63 05/02/2017   BUN 15 05/02/2017   CO2 30 05/02/2017   TSH 1.96 05/02/2017   HGBA1C 5.6 05/02/2017    Lab Results  Component Value Date   TSH 1.96 05/02/2017   Lab Results  Component Value Date   WBC 8.8 05/02/2017   HGB 13.0 05/02/2017   HCT 38.2 05/02/2017   MCV 85.2 05/02/2017   PLT 344.0 05/02/2017   Lab Results  Component Value Date   NA 137 05/02/2017   K 3.9 05/02/2017   CO2 30 05/02/2017   GLUCOSE 81 05/02/2017   BUN 15 05/02/2017   CREATININE 0.63 05/02/2017   BILITOT 0.4 05/02/2017   ALKPHOS 54 05/02/2017   AST 17 05/02/2017   ALT 20 05/02/2017   PROT 7.5 05/02/2017   ALBUMIN 4.4 05/02/2017   CALCIUM 9.4 05/02/2017   GFR 108.09 05/02/2017   Lab Results  Component Value Date   CHOL 212 (H) 05/02/2017   Lab Results  Component Value Date   HDL 76.70 05/02/2017   Lab Results  Component Value Date   LDLCALC 111 (H) 05/02/2017   Lab Results  Component Value Date   TRIG 118.0 05/02/2017   Lab Results    Component Value Date   CHOLHDL 3 05/02/2017   Lab Results  Component Value Date   HGBA1C 5.6 05/02/2017         Assessment & Plan:   Problem List Items Addressed This Visit    Hyperlipidemia    Encouraged heart healthy diet, increase exercise, avoid trans fats, consider a krill oil cap daily      Relevant Orders   Lipid panel (Completed)   Hypertension    Well controlled, no changes to meds. Encouraged heart healthy diet such as the DASH diet and exercise as tolerated.       Relevant Orders   CBC with Differential/Platelet (Completed)   Comprehensive metabolic panel (Completed)   TSH (Completed)   Vitamin D deficiency    Check a level today, noted to be low. Labs reveal deficiency. Start on Vitamin D 50000 IU caps, 1 cap po weekly x 12 weeks. Disp #4 with 4 rf. Also take daily Vitamin D over the counter. If already taking a daily supplement increase by 1000 IU daily and if not start Vitamin D 2000 IU daily.       Relevant Orders   VITAMIN D 25 Hydroxy (Vit-D Deficiency, Fractures) (Completed)   Vitamin B12 deficiency    Check level today encouraged daily supplements      Relevant Orders   Vitamin B12 (Completed)   Hyperglycemia    hgba1c acceptable, minimize simple carbs. Increase exercise as tolerated.       Relevant Orders   Hemoglobin A1c (Completed)      I am having Tenlee L. Abundis maintain her DULoxetine, LORazepam, ranitidine, amLODipine, hydrochlorothiazide, and metoprolol succinate.  No orders of  the defined types were placed in this encounter.   CMA served as Education administrator during this visit. History, Physical and Plan performed by medical provider. Documentation and orders reviewed and attested to.  Penni Homans, MD

## 2017-05-02 NOTE — Assessment & Plan Note (Signed)
hgba1c acceptable, minimize simple carbs. Increase exercise as tolerated.  

## 2017-05-02 NOTE — Assessment & Plan Note (Signed)
Encouraged heart healthy diet, increase exercise, avoid trans fats, consider a krill oil cap daily 

## 2017-05-02 NOTE — Assessment & Plan Note (Addendum)
Check level today encouraged daily supplements

## 2017-05-02 NOTE — Patient Instructions (Signed)
DASH Eating Plan DASH stands for "Dietary Approaches to Stop Hypertension." The DASH eating plan is a healthy eating plan that has been shown to reduce high blood pressure (hypertension). It may also reduce your risk for type 2 diabetes, heart disease, and stroke. The DASH eating plan may also help with weight loss. What are tips for following this plan? General guidelines  Avoid eating more than 2,300 mg (milligrams) of salt (sodium) a day. If you have hypertension, you may need to reduce your sodium intake to 1,500 mg a day.  Limit alcohol intake to no more than 1 drink a day for nonpregnant women and 2 drinks a day for men. One drink equals 12 oz of beer, 5 oz of wine, or 1 oz of hard liquor.  Work with your health care provider to maintain a healthy body weight or to lose weight. Ask what an ideal weight is for you.  Get at least 30 minutes of exercise that causes your heart to beat faster (aerobic exercise) most days of the week. Activities may include walking, swimming, or biking.  Work with your health care provider or diet and nutrition specialist (dietitian) to adjust your eating plan to your individual calorie needs. Reading food labels  Check food labels for the amount of sodium per serving. Choose foods with less than 5 percent of the Daily Value of sodium. Generally, foods with less than 300 mg of sodium per serving fit into this eating plan.  To find whole grains, look for the word "whole" as the first word in the ingredient list. Shopping  Buy products labeled as "low-sodium" or "no salt added."  Buy fresh foods. Avoid canned foods and premade or frozen meals. Cooking  Avoid adding salt when cooking. Use salt-free seasonings or herbs instead of table salt or sea salt. Check with your health care provider or pharmacist before using salt substitutes.  Do not fry foods. Cook foods using healthy methods such as baking, boiling, grilling, and broiling instead.  Cook with  heart-healthy oils, such as olive, canola, soybean, or sunflower oil. Meal planning   Eat a balanced diet that includes: ? 5 or more servings of fruits and vegetables each day. At each meal, try to fill half of your plate with fruits and vegetables. ? Up to 6-8 servings of whole grains each day. ? Less than 6 oz of lean meat, poultry, or fish each day. A 3-oz serving of meat is about the same size as a deck of cards. One egg equals 1 oz. ? 2 servings of low-fat dairy each day. ? A serving of nuts, seeds, or beans 5 times each week. ? Heart-healthy fats. Healthy fats called Omega-3 fatty acids are found in foods such as flaxseeds and coldwater fish, like sardines, salmon, and mackerel.  Limit how much you eat of the following: ? Canned or prepackaged foods. ? Food that is high in trans fat, such as fried foods. ? Food that is high in saturated fat, such as fatty meat. ? Sweets, desserts, sugary drinks, and other foods with added sugar. ? Full-fat dairy products.  Do not salt foods before eating.  Try to eat at least 2 vegetarian meals each week.  Eat more home-cooked food and less restaurant, buffet, and fast food.  When eating at a restaurant, ask that your food be prepared with less salt or no salt, if possible. What foods are recommended? The items listed may not be a complete list. Talk with your dietitian about what   dietary choices are best for you. Grains Whole-grain or whole-wheat bread. Whole-grain or whole-wheat pasta. Brown rice. Oatmeal. Quinoa. Bulgur. Whole-grain and low-sodium cereals. Pita bread. Low-fat, low-sodium crackers. Whole-wheat flour tortillas. Vegetables Fresh or frozen vegetables (raw, steamed, roasted, or grilled). Low-sodium or reduced-sodium tomato and vegetable juice. Low-sodium or reduced-sodium tomato sauce and tomato paste. Low-sodium or reduced-sodium canned vegetables. Fruits All fresh, dried, or frozen fruit. Canned fruit in natural juice (without  added sugar). Meat and other protein foods Skinless chicken or turkey. Ground chicken or turkey. Pork with fat trimmed off. Fish and seafood. Egg whites. Dried beans, peas, or lentils. Unsalted nuts, nut butters, and seeds. Unsalted canned beans. Lean cuts of beef with fat trimmed off. Low-sodium, lean deli meat. Dairy Low-fat (1%) or fat-free (skim) milk. Fat-free, low-fat, or reduced-fat cheeses. Nonfat, low-sodium ricotta or cottage cheese. Low-fat or nonfat yogurt. Low-fat, low-sodium cheese. Fats and oils Soft margarine without trans fats. Vegetable oil. Low-fat, reduced-fat, or light mayonnaise and salad dressings (reduced-sodium). Canola, safflower, olive, soybean, and sunflower oils. Avocado. Seasoning and other foods Herbs. Spices. Seasoning mixes without salt. Unsalted popcorn and pretzels. Fat-free sweets. What foods are not recommended? The items listed may not be a complete list. Talk with your dietitian about what dietary choices are best for you. Grains Baked goods made with fat, such as croissants, muffins, or some breads. Dry pasta or rice meal packs. Vegetables Creamed or fried vegetables. Vegetables in a cheese sauce. Regular canned vegetables (not low-sodium or reduced-sodium). Regular canned tomato sauce and paste (not low-sodium or reduced-sodium). Regular tomato and vegetable juice (not low-sodium or reduced-sodium). Pickles. Olives. Fruits Canned fruit in a light or heavy syrup. Fried fruit. Fruit in cream or butter sauce. Meat and other protein foods Fatty cuts of meat. Ribs. Fried meat. Bacon. Sausage. Bologna and other processed lunch meats. Salami. Fatback. Hotdogs. Bratwurst. Salted nuts and seeds. Canned beans with added salt. Canned or smoked fish. Whole eggs or egg yolks. Chicken or turkey with skin. Dairy Whole or 2% milk, cream, and half-and-half. Whole or full-fat cream cheese. Whole-fat or sweetened yogurt. Full-fat cheese. Nondairy creamers. Whipped toppings.  Processed cheese and cheese spreads. Fats and oils Butter. Stick margarine. Lard. Shortening. Ghee. Bacon fat. Tropical oils, such as coconut, palm kernel, or palm oil. Seasoning and other foods Salted popcorn and pretzels. Onion salt, garlic salt, seasoned salt, table salt, and sea salt. Worcestershire sauce. Tartar sauce. Barbecue sauce. Teriyaki sauce. Soy sauce, including reduced-sodium. Steak sauce. Canned and packaged gravies. Fish sauce. Oyster sauce. Cocktail sauce. Horseradish that you find on the shelf. Ketchup. Mustard. Meat flavorings and tenderizers. Bouillon cubes. Hot sauce and Tabasco sauce. Premade or packaged marinades. Premade or packaged taco seasonings. Relishes. Regular salad dressings. Where to find more information:  National Heart, Lung, and Blood Institute: www.nhlbi.nih.gov  American Heart Association: www.heart.org Summary  The DASH eating plan is a healthy eating plan that has been shown to reduce high blood pressure (hypertension). It may also reduce your risk for type 2 diabetes, heart disease, and stroke.  With the DASH eating plan, you should limit salt (sodium) intake to 2,300 mg a day. If you have hypertension, you may need to reduce your sodium intake to 1,500 mg a day.  When on the DASH eating plan, aim to eat more fresh fruits and vegetables, whole grains, lean proteins, low-fat dairy, and heart-healthy fats.  Work with your health care provider or diet and nutrition specialist (dietitian) to adjust your eating plan to your individual   calorie needs. This information is not intended to replace advice given to you by your health care provider. Make sure you discuss any questions you have with your health care provider. Document Released: 05/03/2011 Document Revised: 05/07/2016 Document Reviewed: 05/07/2016 Elsevier Interactive Patient Education  2017 Elsevier Inc.  

## 2017-05-02 NOTE — Assessment & Plan Note (Signed)
Well controlled, no changes to meds. Encouraged heart healthy diet such as the DASH diet and exercise as tolerated.  °

## 2017-05-03 LAB — CBC WITH DIFFERENTIAL/PLATELET
BASOS PCT: 0.5 % (ref 0.0–3.0)
Basophils Absolute: 0 10*3/uL (ref 0.0–0.1)
EOS PCT: 2.6 % (ref 0.0–5.0)
Eosinophils Absolute: 0.2 10*3/uL (ref 0.0–0.7)
HEMATOCRIT: 38.2 % (ref 36.0–46.0)
HEMOGLOBIN: 13 g/dL (ref 12.0–15.0)
LYMPHS PCT: 29.6 % (ref 12.0–46.0)
Lymphs Abs: 2.6 10*3/uL (ref 0.7–4.0)
MCHC: 33.9 g/dL (ref 30.0–36.0)
MCV: 85.2 fl (ref 78.0–100.0)
MONO ABS: 0.5 10*3/uL (ref 0.1–1.0)
MONOS PCT: 5.8 % (ref 3.0–12.0)
Neutro Abs: 5.4 10*3/uL (ref 1.4–7.7)
Neutrophils Relative %: 61.5 % (ref 43.0–77.0)
Platelets: 344 10*3/uL (ref 150.0–400.0)
RBC: 4.48 Mil/uL (ref 3.87–5.11)
RDW: 15 % (ref 11.5–15.5)
WBC: 8.8 10*3/uL (ref 4.0–10.5)

## 2017-05-03 LAB — LIPID PANEL
CHOLESTEROL: 212 mg/dL — AB (ref 0–200)
HDL: 76.7 mg/dL (ref >39.00)
LDL CALC: 111 mg/dL — AB (ref 0–99)
NonHDL: 134.87
TRIGLYCERIDES: 118 mg/dL (ref 0.0–149.0)
Total CHOL/HDL Ratio: 3
VLDL: 23.6 mg/dL (ref 0.0–40.0)

## 2017-05-03 LAB — COMPREHENSIVE METABOLIC PANEL
ALBUMIN: 4.4 g/dL (ref 3.5–5.2)
ALK PHOS: 54 U/L (ref 39–117)
ALT: 20 U/L (ref 0–35)
AST: 17 U/L (ref 0–37)
BUN: 15 mg/dL (ref 6–23)
CALCIUM: 9.4 mg/dL (ref 8.4–10.5)
CHLORIDE: 98 meq/L (ref 96–112)
CO2: 30 mEq/L (ref 19–32)
CREATININE: 0.63 mg/dL (ref 0.40–1.20)
GFR: 108.09 mL/min (ref >60.00)
Glucose, Bld: 81 mg/dL (ref 70–99)
POTASSIUM: 3.9 meq/L (ref 3.5–5.1)
SODIUM: 137 meq/L (ref 135–145)
TOTAL PROTEIN: 7.5 g/dL (ref 6.0–8.3)
Total Bilirubin: 0.4 mg/dL (ref 0.2–1.2)

## 2017-05-03 LAB — VITAMIN B12: Vitamin B-12: 285 pg/mL (ref 211–911)

## 2017-05-03 LAB — VITAMIN D 25 HYDROXY (VIT D DEFICIENCY, FRACTURES): VITD: 24.43 ng/mL — ABNORMAL LOW (ref 30.00–100.00)

## 2017-05-03 LAB — TSH: TSH: 1.96 u[IU]/mL (ref 0.35–4.50)

## 2017-05-03 LAB — HEMOGLOBIN A1C: HEMOGLOBIN A1C: 5.6 % (ref 4.6–6.5)

## 2017-05-14 DIAGNOSIS — G4733 Obstructive sleep apnea (adult) (pediatric): Secondary | ICD-10-CM | POA: Diagnosis not present

## 2017-05-28 DIAGNOSIS — G4733 Obstructive sleep apnea (adult) (pediatric): Secondary | ICD-10-CM | POA: Diagnosis not present

## 2017-06-02 ENCOUNTER — Other Ambulatory Visit: Payer: Self-pay | Admitting: Family Medicine

## 2017-06-02 DIAGNOSIS — I10 Essential (primary) hypertension: Secondary | ICD-10-CM

## 2017-06-03 DIAGNOSIS — M67471 Ganglion, right ankle and foot: Secondary | ICD-10-CM | POA: Diagnosis not present

## 2017-06-03 DIAGNOSIS — M79671 Pain in right foot: Secondary | ICD-10-CM | POA: Diagnosis not present

## 2017-06-14 DIAGNOSIS — G4733 Obstructive sleep apnea (adult) (pediatric): Secondary | ICD-10-CM | POA: Diagnosis not present

## 2017-07-04 DIAGNOSIS — M79671 Pain in right foot: Secondary | ICD-10-CM | POA: Diagnosis not present

## 2017-07-04 DIAGNOSIS — M67471 Ganglion, right ankle and foot: Secondary | ICD-10-CM | POA: Diagnosis not present

## 2017-07-15 DIAGNOSIS — G4733 Obstructive sleep apnea (adult) (pediatric): Secondary | ICD-10-CM | POA: Diagnosis not present

## 2017-07-24 DIAGNOSIS — Z01818 Encounter for other preprocedural examination: Secondary | ICD-10-CM | POA: Diagnosis not present

## 2017-07-26 ENCOUNTER — Ambulatory Visit (INDEPENDENT_AMBULATORY_CARE_PROVIDER_SITE_OTHER): Payer: Federal, State, Local not specified - PPO | Admitting: Family Medicine

## 2017-07-26 ENCOUNTER — Encounter: Payer: Self-pay | Admitting: Family Medicine

## 2017-07-26 VITALS — BP 148/92 | HR 80 | Temp 98.3°F | Resp 16 | Ht 62.99 in | Wt 230.4 lb

## 2017-07-26 DIAGNOSIS — R739 Hyperglycemia, unspecified: Secondary | ICD-10-CM

## 2017-07-26 DIAGNOSIS — Z0001 Encounter for general adult medical examination with abnormal findings: Secondary | ICD-10-CM

## 2017-07-26 DIAGNOSIS — R079 Chest pain, unspecified: Secondary | ICD-10-CM

## 2017-07-26 DIAGNOSIS — I10 Essential (primary) hypertension: Secondary | ICD-10-CM

## 2017-07-26 DIAGNOSIS — Z0000 Encounter for general adult medical examination without abnormal findings: Secondary | ICD-10-CM

## 2017-07-26 DIAGNOSIS — E6609 Other obesity due to excess calories: Secondary | ICD-10-CM

## 2017-07-26 DIAGNOSIS — E559 Vitamin D deficiency, unspecified: Secondary | ICD-10-CM | POA: Diagnosis not present

## 2017-07-26 DIAGNOSIS — F329 Major depressive disorder, single episode, unspecified: Secondary | ICD-10-CM

## 2017-07-26 DIAGNOSIS — Z79899 Other long term (current) drug therapy: Secondary | ICD-10-CM

## 2017-07-26 DIAGNOSIS — F32A Depression, unspecified: Secondary | ICD-10-CM

## 2017-07-26 DIAGNOSIS — E782 Mixed hyperlipidemia: Secondary | ICD-10-CM

## 2017-07-26 DIAGNOSIS — F419 Anxiety disorder, unspecified: Secondary | ICD-10-CM

## 2017-07-26 DIAGNOSIS — E538 Deficiency of other specified B group vitamins: Secondary | ICD-10-CM

## 2017-07-26 DIAGNOSIS — Z6839 Body mass index (BMI) 39.0-39.9, adult: Secondary | ICD-10-CM

## 2017-07-26 NOTE — Assessment & Plan Note (Signed)
Encouraged heart healthy diet, increase exercise, avoid trans fats, consider a krill oil cap daily 

## 2017-07-26 NOTE — Assessment & Plan Note (Signed)
Check level at next visit

## 2017-07-26 NOTE — Assessment & Plan Note (Addendum)
Not well controlled, switch to Maxzide from HCTZ. Encouraged heart healthy diet such as the DASH diet and exercise as tolerated. Recheck in 1 month

## 2017-07-26 NOTE — Assessment & Plan Note (Addendum)
Encouraged DASH diet, decrease po intake and increase exercise as tolerated. Needs 7-8 hours of sleep nightly. Avoid trans fats, eat small, frequent meals every 4-5 hours with lean proteins, complex carbs and healthy fats. Minimize simple carbs, DASH or MIND diet consider

## 2017-07-26 NOTE — Assessment & Plan Note (Signed)
hgba1c acceptable, minimize simple carbs. Increase exercise as tolerated.  

## 2017-07-26 NOTE — Patient Instructions (Addendum)
Vitamin D 2000 IU daily  [place]  Preventive Care 40-64 Years, Female Preventive care refers to lifestyle choices and visits with your health care provider that can promote health and wellness. What does preventive care include?  A yearly physical exam. This is also called an annual well check.  Dental exams once or twice a year.  Routine eye exams. Ask your health care provider how often you should have your eyes checked.  Personal lifestyle choices, including: ? Daily care of your teeth and gums. ? Regular physical activity. ? Eating a healthy diet. ? Avoiding tobacco and drug use. ? Limiting alcohol use. ? Practicing safe sex. ? Taking low-dose aspirin daily starting at age 13. ? Taking vitamin and mineral supplements as recommended by your health care provider. What happens during an annual well check? The services and screenings done by your health care provider during your annual well check will depend on your age, overall health, lifestyle risk factors, and family history of disease. Counseling Your health care provider may ask you questions about your:  Alcohol use.  Tobacco use.  Drug use.  Emotional well-being.  Home and relationship well-being.  Sexual activity.  Eating habits.  Work and work Statistician.  Method of birth control.  Menstrual cycle.  Pregnancy history.  Screening You may have the following tests or measurements:  Height, weight, and BMI.  Blood pressure.  Lipid and cholesterol levels. These may be checked every 5 years, or more frequently if you are over 60 years old.  Skin check.  Lung cancer screening. You may have this screening every year starting at age 48 if you have a 30-pack-year history of smoking and currently smoke or have quit within the past 15 years.  Fecal occult blood test (FOBT) of the stool. You may have this test every year starting at age 72.  Flexible sigmoidoscopy or colonoscopy. You may have a sigmoidoscopy  every 5 years or a colonoscopy every 10 years starting at age 63.  Hepatitis C blood test.  Hepatitis B blood test.  Sexually transmitted disease (STD) testing.  Diabetes screening. This is done by checking your blood sugar (glucose) after you have not eaten for a while (fasting). You may have this done every 1-3 years.  Mammogram. This may be done every 1-2 years. Talk to your health care provider about when you should start having regular mammograms. This may depend on whether you have a family history of breast cancer.  BRCA-related cancer screening. This may be done if you have a family history of breast, ovarian, tubal, or peritoneal cancers.  Pelvic exam and Pap test. This may be done every 3 years starting at age 90. Starting at age 81, this may be done every 5 years if you have a Pap test in combination with an HPV test.  Bone density scan. This is done to screen for osteoporosis. You may have this scan if you are at high risk for osteoporosis.  Discuss your test results, treatment options, and if necessary, the need for more tests with your health care provider. Vaccines Your health care provider may recommend certain vaccines, such as:  Influenza vaccine. This is recommended every year.  Tetanus, diphtheria, and acellular pertussis (Tdap, Td) vaccine. You may need a Td booster every 10 years.  Varicella vaccine. You may need this if you have not been vaccinated.  Zoster vaccine. You may need this after age 24.  Measles, mumps, and rubella (MMR) vaccine. You may need at least one  dose of MMR if you were born in 1957 or later. You may also need a second dose.  Pneumococcal 13-valent conjugate (PCV13) vaccine. You may need this if you have certain conditions and were not previously vaccinated.  Pneumococcal polysaccharide (PPSV23) vaccine. You may need one or two doses if you smoke cigarettes or if you have certain conditions.  Meningococcal vaccine. You may need this if  you have certain conditions.  Hepatitis A vaccine. You may need this if you have certain conditions or if you travel or work in places where you may be exposed to hepatitis A.  Hepatitis B vaccine. You may need this if you have certain conditions or if you travel or work in places where you may be exposed to hepatitis B.  Haemophilus influenzae type b (Hib) vaccine. You may need this if you have certain conditions.  Talk to your health care provider about which screenings and vaccines you need and how often you need them. This information is not intended to replace advice given to you by your health care provider. Make sure you discuss any questions you have with your health care provider. Document Released: 06/10/2015 Document Revised: 02/01/2016 Document Reviewed: 03/15/2015 Elsevier Interactive Patient Education  [employer].

## 2017-07-26 NOTE — Assessment & Plan Note (Addendum)
Struggling

## 2017-07-26 NOTE — Assessment & Plan Note (Signed)
Vitamin D def will check level with next. Should daily vitamin D

## 2017-07-28 NOTE — Assessment & Plan Note (Signed)
Patient encouraged to maintain heart healthy diet, regular exercise, adequate sleep. Consider daily probiotics. Take medications as prescribed 

## 2017-07-28 NOTE — Progress Notes (Signed)
Julie Bishop [id number] [birth year] 07/28/2017      Progress Note   Subjective  Chief Complaint  Chief Complaint  Patient presents with  . Annual Exam    HPI  Patient is in today for an annual physical exam and follow up on chronic medical concerns including hypertension, Vitamin D Deficiency, reflux and hyperlipidemia. She feels well today but she notes new onset of chest discomfort about a week ago. She describes it as lasting roughly 10 seconds each episode and occurring at both home and work. No associated symptoms but she does endorse a long history of intermittent palpitations and occasional nausea which are unchanged from her baseline. She is trying to maintain a heart healthy diet. She is preparing to have a ganglion cyst removed from her right foot soon due the increasing discomfort. Denies SOB/HA/congestion/fevers or GU c/o. Taking meds as prescribed  Past Medical History:  Diagnosis Date  . Adjustment disorder   . Adjustment disorder 01/25/2017  . Anemia   . Anxiety   . Anxiety and depression   . Back pain   . Cancer ([place]) 2008   skin cancer, BCC  . Chest discomfort    at times  . Constipation 11/14/2014  . Depression   . Edema    feet and legs  . Generalized anxiety disorder   . GERD (gastroesophageal reflux disease)   . Headache 08/24/2015  . Heart valve problem    trivial leak in mitral valve    trivial leak in tricuspid  . Hyperlipidemia    Elevated, but never taken medication  . Hypertension   . Insomnia 08/24/2015  . Joint pain   . Obesity 11/14/2014  . OSA (obstructive sleep apnea) 09/12/2016  . Pain in joint, shoulder region 11/27/2015  . Palpitations   . Post-menopause 08/23/2015  . Preventative health care 08/23/2015  . Raynauds syndrome   . Sleep apnea    appt with pulmonary Dr in march 2018  . SOB (shortness of breath)   . Vitamin B12 deficiency 08/23/2015  . Vitamin D deficiency 08/23/2015    Past Surgical History:  Procedure Laterality Date   . ABDOMINAL HYSTERECTOMY  2010  . BREAST BIOPSY     right, 1996  . ECTOPIC PREGNANCY SURGERY    . SHOULDER OPEN ROTATOR CUFF REPAIR  2017   right  . TUBAL LIGATION     on right  . TYMPANOSTOMY TUBE PLACEMENT      Family History  Problem Relation Age of Onset  . Heart disease Mother        CAD w/MI in mid 43s  . Diabetes Mother   . Depression Mother   . Hypertension Mother   . Hyperlipidemia Mother   . Liver disease Mother   . Sleep apnea Mother   . Obesity Mother   . Diabetes Father   . Cancer Maternal Grandmother        throat  . Heart disease Maternal Grandmother   . Cancer Paternal Grandmother        lung  . Heart disease Paternal Grandmother   . Hypertension Sister        as a teenager    Social History   Socioeconomic History  . Marital status: Married    Spouse name: Rebeka Kimble  . Number of children: 1  . Years of education: 3  . Highest education level: Not on file  Social Needs  . Financial resource strain: Not on file  . Food insecurity - worry: Not  on file  . Food insecurity - inability: Not on file  . Transportation needs - medical: Not on file  . Transportation needs - non-medical: Not on file  Occupational History  . 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[employer]  Tobacco Use  . Smoking status: Never Smoker  . Smokeless tobacco: Never Used  Substance and Sexual Activity  . Alcohol use: No    Alcohol/week: 0.0 oz  . Drug use: No  . 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 Not on file  Social History Narrative  . Not on file    Current Outpatient Medications on File Prior to Visit  Medication Sig Dispense Refill  . amLODipine (NORVASC) 5 MG tablet TAKE ONE TABLET BY MOUTH DAILY 90 tablet 0  . DULoxetine (CYMBALTA) 60 MG capsule Take 1 capsule by mouth daily.    .  hydrochlorothiazide (HYDRODIURIL) 25 MG tablet TAKE ONE TABLET BY MOUTH DAILY 30 tablet 3  . LORazepam (ATIVAN) 0.5 MG tablet Take 0.5 mg by mouth daily.     . metoprolol succinate (TOPROL-XL) 50 MG 24 hr tablet TAKE 1 TABLET BY MOUTH DAILY. TAKE WITH, OR IMMEDIATELY FOLLOWING, A MEAL. 30 tablet 3  . ranitidine (ZANTAC) 300 MG tablet Take 1 tablet (300 mg total) by mouth at bedtime as needed for heartburn. 30 tablet 3   No current facility-administered medications on file prior to visit.     Allergies  Allergen Reactions  . Oxycodone Itching  . Losartan Other (See Comments)    Diarrhea and abodminal cramping    Review of Systems  Review of Systems  Constitutional: Negative for fever and malaise/fatigue.  HENT: Negative for congestion.   Eyes: Negative for blurred vision.  Respiratory: Negative for shortness of breath.   Cardiovascular: Positive for chest pain and palpitations. Negative for leg swelling.  Gastrointestinal: Positive for nausea. Negative for abdominal pain and blood in stool.  Genitourinary: Negative for dysuria and frequency.  Musculoskeletal: Negative for falls.  Skin: Negative for rash.  Neurological: Negative for dizziness, loss of consciousness and headaches.  Endo/Heme/Allergies: Negative for environmental allergies.  Psychiatric/Behavioral: Negative for depression. The patient is not nervous/anxious.      Objective  BP (!) 148/92 (BP Location: Left Arm, Patient Position: Sitting, Cuff Size: Large)   Pulse 80   Temp 98.3 F (36.8 C) (Oral)   Resp 16   Ht 5' 2.99" (1.6 m)   Wt 230 lb 6.4 oz (104.5 kg)   SpO2 99%   BMI 40.82 kg/m   Physical Exam  Physical Exam  Constitutional: She is oriented to person, place, and time and well-developed, well-nourished, and in no distress. No distress.  HENT:  Head: Normocephalic and atraumatic.  Right Ear: External ear normal.  Left Ear: External ear normal.  Nose: Nose normal.  Mouth/Throat: Oropharynx is  clear and moist. No oropharyngeal exudate.  Eyes: Conjunctivae are normal. Pupils are equal, round, and reactive to light. Right eye exhibits no discharge. Left eye exhibits no discharge. No scleral icterus.  Neck: Normal range of motion. Neck supple. No thyromegaly present.  Cardiovascular: Normal rate, regular rhythm, normal heart sounds and intact distal pulses.  No murmur heard. Pulmonary/Chest: Effort normal and breath sounds normal. No respiratory distress. She has no wheezes. She has no rales.  Abdominal: Soft. Bowel sounds are normal. She exhibits no  distension and no mass. There is no tenderness.  Musculoskeletal: Normal range of motion. She exhibits no edema or tenderness.  Lymphadenopathy:    She has no cervical adenopathy.  Neurological: She is alert and oriented to person, place, and time. She has normal reflexes. No cranial nerve deficit. Coordination normal.  Skin: Skin is warm and dry. No rash noted. She is not diaphoretic.  Psychiatric: Mood, memory and affect normal.       Assessment & Plan  Hyperglycemia hgba1c acceptable, minimize simple carbs. Increase exercise as tolerated.   Hypertension Not well controlled, switch to Maxzide from HCTZ. Encouraged heart healthy diet such as the DASH diet and exercise as tolerated. Recheck in 1 month  Hyperlipidemia Encouraged heart healthy diet, increase exercise, avoid trans fats, consider a krill oil cap daily  Vitamin D deficiency  Vitamin D def will check level with next. Should daily vitamin D  Vitamin B12 deficiency Check level at next visit   Obesity Encouraged DASH diet, decrease po intake and increase exercise as tolerated. Needs 7-8 hours of sleep nightly. Avoid trans fats, eat small, frequent meals every 4-5 hours with lean proteins, complex carbs and healthy fats. Minimize simple carbs, DASH or MIND diet consider  Anxiety and depression Struggling   Chest pain She is describes fleeting episode of chest  discomfort over past week. Lasts roughly 10 seconds and resolves without associated symptoms. She is refered to cardiology for further consideration. She will minimize strenuous activity til cardiac evaluation is complete. EKG is unremarkable today  Preventative health care Patient encouraged to maintain heart healthy diet, regular exercise, adequate sleep. Consider daily probiotics. Take medications as prescribed  [name] MD

## 2017-07-28 NOTE — Assessment & Plan Note (Signed)
She is describes fleeting episode of chest discomfort over past week. Lasts roughly 10 seconds and resolves without associated symptoms. She is refered to cardiology for further consideration. She will minimize strenuous activity til cardiac evaluation is complete. EKG is unremarkable today

## 2017-07-30 ENCOUNTER — Other Ambulatory Visit: Payer: Self-pay | Admitting: Family Medicine

## 2017-07-30 ENCOUNTER — Encounter: Payer: Self-pay | Admitting: Family Medicine

## 2017-07-30 ENCOUNTER — Ambulatory Visit: Payer: Federal, State, Local not specified - PPO | Admitting: Physician Assistant

## 2017-07-30 ENCOUNTER — Encounter: Payer: Self-pay | Admitting: Physician Assistant

## 2017-07-30 VITALS — BP 124/84 | HR 64 | Ht 64.0 in | Wt 229.8 lb

## 2017-07-30 DIAGNOSIS — Z01818 Encounter for other preprocedural examination: Secondary | ICD-10-CM | POA: Diagnosis not present

## 2017-07-30 DIAGNOSIS — I10 Essential (primary) hypertension: Secondary | ICD-10-CM | POA: Diagnosis not present

## 2017-07-30 DIAGNOSIS — R0789 Other chest pain: Secondary | ICD-10-CM

## 2017-07-30 DIAGNOSIS — G4733 Obstructive sleep apnea (adult) (pediatric): Secondary | ICD-10-CM | POA: Diagnosis not present

## 2017-07-30 DIAGNOSIS — R0602 Shortness of breath: Secondary | ICD-10-CM

## 2017-07-30 LAB — PAIN MGMT, PROFILE 8 W/CONF, U
6 Acetylmorphine: NEGATIVE ng/mL (ref <10)
ALPHAHYDROXYALPRAZOLAM: NEGATIVE ng/mL (ref <25)
ALPHAHYDROXYMIDAZOLAM: NEGATIVE ng/mL (ref <50)
ALPHAHYDROXYTRIAZOLAM: NEGATIVE ng/mL (ref <50)
AMPHETAMINES: NEGATIVE ng/mL (ref <500)
Alcohol Metabolites: NEGATIVE ng/mL (ref <500)
Aminoclonazepam: NEGATIVE ng/mL (ref <25)
BENZODIAZEPINES: POSITIVE ng/mL — AB (ref <100)
BUPRENORPHINE, URINE: NEGATIVE ng/mL (ref <5)
CREATININE: 145.5 mg/dL
Cocaine Metabolite: NEGATIVE ng/mL (ref <150)
Hydroxyethylflurazepam: NEGATIVE ng/mL (ref <50)
Lorazepam: 328 ng/mL — ABNORMAL HIGH (ref <50)
MARIJUANA METABOLITE: NEGATIVE ng/mL (ref <20)
MDMA: NEGATIVE ng/mL (ref <500)
NORDIAZEPAM: NEGATIVE ng/mL (ref <50)
Opiates: NEGATIVE ng/mL (ref <100)
Oxazepam: NEGATIVE ng/mL (ref <50)
Oxidant: NEGATIVE ug/mL (ref <200)
Oxycodone: NEGATIVE ng/mL (ref <100)
TEMAZEPAM: NEGATIVE ng/mL (ref <50)
pH: 6.16 (ref 4.5–9.0)

## 2017-07-30 MED ORDER — HYDROCHLOROTHIAZIDE 25 MG PO TABS: 25.0000 mg | ORAL_TABLET | Freq: Every day | ORAL | 0 refills | Status: DC

## 2017-07-30 MED ORDER — AMLODIPINE BESYLATE 5 MG PO TABS: 5.0000 mg | ORAL_TABLET | Freq: Every day | ORAL | 0 refills | Status: DC

## 2017-07-30 MED ORDER — TRIAMTERENE-HCTZ 37.5-25 MG PO TABS: 1.0000 | ORAL_TABLET | Freq: Every day | ORAL | 1 refills | Status: DC

## 2017-07-30 NOTE — Progress Notes (Signed)
Cardiology Office Note:    Date:  07/30/2017   ID:  Julie Bishop, DOB 18-Sep-1970, MRN [id number]  PCP:  [name], MD  Cardiologist:  Constance Haw, MD   Referring MD: [name], MD   Chief Complaint  Patient presents with  . Chest Pain    History of Present Illness:    Julie Bishop is a 47 y.o. female with hypertension, hyperlipidemia, sleep apnea, obesity who is being seen today for the evaluation of chest pain at the request of [name], MD.   Ms. Setzer was evaluated by Dr. Curt Bears in 2018 for chest pain.  Symptoms were not consistent with angina and no further testing was recommended.  She is here alone today.  She has been experiencing chest discomfort for the past 2 weeks.  It is left-sided and substernal.  She notes pain at times as well as pressure/tightness.  She denies any radiating symptoms but has noted some associated left scapular pain.  She has been nauseated at times.  She denies associated diaphoresis.  Her symptoms do not seem to be clearly associated with exertion.  Her discomfort only last for seconds.  She had it off and on all day last week.  She has not had any chest discomfort this week.  She has noted dyspnea with exertion over the last year.  She notes dyspnea with more moderate to extreme activities.  She denies orthopnea, PND.  She has chronic right ankle swelling.  She denies syncope.  Of note, she has right foot ganglion cyst excision scheduled for Monday of next week (to be done under general anesthesia).  Prior CV studies:   The following studies were reviewed today:  Echo 07/04/16 EF 50-55, GLS -20% (normal), normal wall motion, grade 1 diastolic dysfunction, trivial MR, mild RVE, normal RV SF, trivial TR, PASP 23  Past Medical History:  Diagnosis Date  . Adjustment disorder 01/25/2017  . Anemia   . Anxiety and depression   . Back pain   . Cancer ([place]) 2008   skin cancer, BCC  . Constipation 11/14/2014  .  Depression   . Generalized anxiety disorder   . GERD (gastroesophageal reflux disease)   . Headache 08/24/2015  . History of echocardiogram    Echo 2/18:  EF 50-55, GLS -20% (normal), normal wall motion, grade 1 diastolic dysfunction, trivial MR, mild RVE, normal RV SF, trivial TR, PASP 23  . Hyperlipidemia    Elevated, but never taken medication  . Hypertension   . Insomnia 08/24/2015  . Obesity 11/14/2014  . OSA (obstructive sleep apnea) 09/12/2016  . Pain in joint, shoulder region 11/27/2015  . Palpitations   . Post-menopause 08/23/2015  . Raynauds syndrome   . Sleep apnea    appt with pulmonary Dr in march 2018  . Vitamin B12 deficiency 08/23/2015  . Vitamin D deficiency 08/23/2015    Past Surgical History:  Procedure Laterality Date  . ABDOMINAL HYSTERECTOMY  2010  . BREAST BIOPSY     right, 1996  . ECTOPIC PREGNANCY SURGERY    . SHOULDER OPEN ROTATOR CUFF REPAIR  2017   right  . TUBAL LIGATION     on right  . TYMPANOSTOMY TUBE PLACEMENT      Current Medications: Current Meds  Medication Sig  . DULoxetine (CYMBALTA) 30 MG capsule Take 30 mg by mouth 3 (three) times daily.   Marland Kitchen LORazepam (ATIVAN) 0.5 MG tablet Take 0.5 mg by mouth daily.   Marland Kitchen  metoprolol succinate (TOPROL-XL) 50 MG 24 hr tablet TAKE 1 TABLET BY MOUTH DAILY. TAKE WITH, OR IMMEDIATELY FOLLOWING, A MEAL.  . ranitidine (ZANTAC) 300 MG tablet Take 1 tablet (300 mg total) by mouth at bedtime as needed for heartburn.  . [DISCONTINUED] amLODipine (NORVASC) 5 MG tablet TAKE ONE TABLET BY MOUTH DAILY  . [DISCONTINUED] hydrochlorothiazide (HYDRODIURIL) 25 MG tablet TAKE ONE TABLET BY MOUTH DAILY     Allergies:   Losartan and Oxycodone   Social History   Socioeconomic History  . Marital status: Married    Spouse name: Julie Bishop  . Number of children: 1  . Years of education: 69  . Highest education level: None  Social Needs  . Financial resource strain: None  . Food insecurity - worry: None  . Food  insecurity - inability: None  . Transportation needs - medical: None  . Transportation needs - non-medical: None  Occupational History  . 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[employer]  Tobacco Use  . Smoking status: Never Smoker  . Smokeless tobacco: Never Used  Substance and Sexual Activity  . Alcohol use: No    Alcohol/week: 0.0 oz  . Drug use: No  . Sexual activity: Yes    Partners: Male    Birth control/protection: Post-menopausal    Comment: 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[place], [employer] lives with husband  Other Topics Concern  . None  Social History Narrative  . None     Family Hx: The patient's family history includes Cancer in her maternal grandmother and paternal grandmother; Depression in her mother; Diabetes in her father and mother; Heart attack in her maternal grandmother; Heart attack (age of onset: 53) in her mother; Heart disease in her paternal grandmother; Hyperlipidemia in her mother; Hypertension in her father, mother, and sister; Liver disease in her mother; Obesity in her mother; Sleep apnea in her mother.  ROS:   Please see the history of present illness.    Review of Systems  Constitution: Positive for malaise/fatigue.  Cardiovascular: Positive for chest pain, dyspnea on exertion and irregular heartbeat.  Respiratory: Positive for snoring.   Neurological: Positive for headaches.  Psychiatric/Behavioral: Positive for depression. The patient is nervous/anxious.    All other systems reviewed and are negative.   EKGs/Labs/Other Test Reviewed:    EKG:  EKG is  ordered today.  The ekg ordered today demonstrates normal sinus rhythm, heart rate 65, normal axis, QTC 416 ms  Recent Labs: 05/02/2017: ALT 20; BUN 15; Creatinine, Ser 0.63; Hemoglobin 13.0; Platelets 344.0; Potassium 3.9; Sodium 137; TSH 1.96   Recent Lipid Panel Lab Results  Component Value Date/Time   CHOL 212 (H) 05/02/2017 05:24 PM   CHOL 226 (H) 07/12/2016 10:58 AM   TRIG  118.0 05/02/2017 05:24 PM   HDL 76.70 05/02/2017 05:24 PM   HDL 73 07/12/2016 10:58 AM   CHOLHDL 3 05/02/2017 05:24 PM   LDLCALC 111 (H) 05/02/2017 05:24 PM   LDLCALC 134 (H) 07/12/2016 10:58 AM    Physical Exam:    VS:  BP 124/84   Pulse 64   Ht 5\' 4"  (1.626 m)   Wt 229 lb 12.8 oz (104.2 kg)   SpO2 97%   BMI 39.45 kg/m     Wt Readings from Last 3 Encounters:  07/30/17 229 lb 12.8 oz (104.2 kg)  07/26/17 230 lb 6.4 oz (104.5 kg)  05/02/17 229 lb 6.4 oz (104.1 kg)     Physical Exam  Constitutional: She is oriented to  person, place, and time. She appears well-developed and well-nourished. No distress.  HENT:  Head: Normocephalic and atraumatic.  Neck: No JVD present. Carotid bruit is not present.  Cardiovascular: Normal rate and regular rhythm.  No murmur heard. Pulmonary/Chest: Effort normal. She has no rales.  Abdominal: Soft.  Musculoskeletal: She exhibits no edema.  Neurological: She is alert and oriented to person, place, and time.  Skin: Skin is warm and dry.    ASSESSMENT & PLAN:    #1.  Other chest pain  She has symptoms of chest discomfort with typical and atypical features.  Her ECG is nonacute.  She does have a family history of coronary artery disease and risk factor of hypertension.  She does not smoke.  She does not have diabetes.  Her 10-year ASCVD risk is low (0.7%).  In any event, she needs surgery under general anesthesia next week.  I recommended proceeding with a stress echocardiogram.  However, we cannot get this arrange for several weeks.  I think she is low risk enough to start with a plain exercise treadmill test.    -Arrange plain exercise treadmill test.   #2.  Shortness of breath She did have diastolic dysfunction on echocardiogram last year.  Obtain BNP today.  Proceed with stress testing as noted.  #3.  Essential hypertension The patient's blood pressure is controlled on her current regimen.  Continue current therapy.   #4.  OSA  (obstructive sleep apnea) Continue CPAP  #5.  Surgical clearance Proceed with stress testing as outlined above.  If this is low risk, no further testing will be needed.   Dispo:  Return As needed based upon testing..   Medication Adjustments/Labs and Tests Ordered: Current medicines are reviewed at length with the patient today.  Concerns regarding medicines are outlined above.  Orders/Tests:  Orders Placed This Encounter  Procedures  . Pro b natriuretic peptide  . Exercise Tolerance Test  . EKG 12-Lead   Medication changes: No orders of the defined types were placed in this encounter.  Signed, Richardson Dopp, PA-C  07/30/2017 4:04 PM    [facility] [address], [place], [place]  [address] Phone: [phone]; Fax: [phone]

## 2017-07-30 NOTE — Patient Instructions (Signed)
Medication Instructions:  Your physician recommends that you continue on your current medications as directed. Please refer to the Current Medication list given to you today.   Labwork: PRO BNP TODAY  Testing/Procedures: Your physician has requested that you have a stress echocardiogram. For further information please visit [email or web address]. Please follow instruction sheet as given. PER SCOTT WEAVER, PAC THIS IS FOR SURGERY CLEARANCE AND NEEDS TO BE DONE BEFORE Friday 08/02/17    Follow-Up: FOLLOW UP AS NEEDED PENDING TEST RESULTS   Any Other Special Instructions Will Be Listed Below (If Applicable).     If you need a refill on your cardiac medications before your next appointment, please call your pharmacy.

## 2017-07-30 NOTE — Telephone Encounter (Signed)
Hypertension Not well controlled, switch to Maxzide from HCTZ. Encouraged heart healthy diet such as the DASH diet and exercise as tolerated. Recheck in 1 month

## 2017-07-30 NOTE — Addendum Note (Signed)
Addended byDamita Dunnings D on: 07/30/2017 01:15 PM   Modules accepted: Orders

## 2017-07-31 LAB — PRO B NATRIURETIC PEPTIDE: NT-PRO BNP: 21 pg/mL (ref 0–249)

## 2017-08-01 ENCOUNTER — Ambulatory Visit (HOSPITAL_COMMUNITY)
Admission: RE | Admit: 2017-08-01 | Discharge: 2017-08-01 | Disposition: A | Discharge status: Home / Self Care | Payer: Federal, State, Local not specified - PPO | Source: Ambulatory Visit | Attending: Physician Assistant | Admitting: Physician Assistant

## 2017-08-01 DIAGNOSIS — Z01818 Encounter for other preprocedural examination: Secondary | ICD-10-CM | POA: Diagnosis not present

## 2017-08-01 DIAGNOSIS — R0602 Shortness of breath: Secondary | ICD-10-CM | POA: Insufficient documentation

## 2017-08-01 DIAGNOSIS — R0789 Other chest pain: Secondary | ICD-10-CM

## 2017-08-02 ENCOUNTER — Encounter: Payer: Self-pay | Admitting: Physician Assistant

## 2017-08-02 ENCOUNTER — Telehealth: Payer: Self-pay | Admitting: *Deleted

## 2017-08-02 LAB — EXERCISE TOLERANCE TEST
Estimated workload: 7 METS
Exercise duration (min): 5 min
Exercise duration (sec): 27 s
MPHR: 174 {beats}/min
Peak HR: 179 {beats}/min
Percent HR: 102 %
Rest HR: 90 {beats}/min

## 2017-08-02 NOTE — Telephone Encounter (Signed)
-----   Message from Liliane Shi, [place] sent at 08/02/2017  2:17 PM EST ----- Please call the patient. The stress test is normal.  She may proceed with her surgery as planned next week. Please send a copy to her surgeon. Please fax a copy of this study result to her PCP:  [name], MD  Thanks! Richardson Dopp, PA-C    08/02/2017 2:15 PM

## 2017-08-02 NOTE — Telephone Encounter (Signed)
Pt has been notified of GXT results by phone with verbal understanding. Pt has been made aware she is cleared for her upcoming surgery. I will fax a copy of results to Dr. Armida Sans [facility] [address] [place], [place] [address], phone # [phone], fax # [phone].  I will forward a copy to PCP as well.

## 2017-08-05 DIAGNOSIS — M67471 Ganglion, right ankle and foot: Secondary | ICD-10-CM | POA: Diagnosis not present

## 2017-08-12 DIAGNOSIS — G4733 Obstructive sleep apnea (adult) (pediatric): Secondary | ICD-10-CM | POA: Diagnosis not present

## 2017-08-15 ENCOUNTER — Other Ambulatory Visit: Payer: Self-pay | Admitting: Family Medicine

## 2017-08-15 DIAGNOSIS — I10 Essential (primary) hypertension: Secondary | ICD-10-CM

## 2017-08-23 ENCOUNTER — Other Ambulatory Visit: Payer: Self-pay | Admitting: Family Medicine

## 2017-08-23 DIAGNOSIS — I10 Essential (primary) hypertension: Secondary | ICD-10-CM

## 2017-08-27 ENCOUNTER — Other Ambulatory Visit (INDEPENDENT_AMBULATORY_CARE_PROVIDER_SITE_OTHER): Payer: Federal, State, Local not specified - PPO

## 2017-08-27 ENCOUNTER — Ambulatory Visit (INDEPENDENT_AMBULATORY_CARE_PROVIDER_SITE_OTHER): Payer: Federal, State, Local not specified - PPO | Admitting: Family Medicine

## 2017-08-27 VITALS — BP 136/96 | HR 70

## 2017-08-27 DIAGNOSIS — I10 Essential (primary) hypertension: Secondary | ICD-10-CM | POA: Diagnosis not present

## 2017-08-27 DIAGNOSIS — R300 Dysuria: Secondary | ICD-10-CM

## 2017-08-27 LAB — BASIC METABOLIC PANEL
BUN: 15 mg/dL (ref 6–23)
CHLORIDE: 101 meq/L (ref 96–112)
CO2: 29 mEq/L (ref 19–32)
Calcium: 9.5 mg/dL (ref 8.4–10.5)
Creatinine, Ser: 0.61 mg/dL (ref 0.40–1.20)
GFR: 112.04 mL/min (ref >60.00)
Glucose, Bld: 101 mg/dL — ABNORMAL HIGH (ref 70–99)
Potassium: 4.6 mEq/L (ref 3.5–5.1)
SODIUM: 137 meq/L (ref 135–145)

## 2017-08-27 NOTE — Progress Notes (Signed)
Pre visit review using our clinic review tool, if applicable. No additional management support is needed unless otherwise documented below in the visit note.  Pt here for blood pressure check per Dr Charlett Blake.  BP Readings from Last 3 Encounters:  07/30/17 124/84  07/26/17 (!) 148/92  05/02/17 132/87   Pt was changed from HCTZ to Maxzide 37.5 / 25 on 07/26/17. She is also taking metoprolol 50mg  XL once a day and Amlodipine 5mg  once a day. Pt reports compliance with medications. . Pt notes that urine is lighter since starting Maxzide. Urine has strong odor now and has some burning at end of urinary stream.  BP @ 9:03am = 138/97 Repeat BP @ 9:16am = 136/96  Advised pt per verbal from PCP to increase Metoprolol XL 50mg  twice a day and continue Maxzide 37.5 / 25mg  once daily and amlodipine 5mg  once daily. Pt will return on 09/10/17 at 4pm for blood pressure check. Orders placed for u/a and culture for above urinary complaints  Nursing bp check note reviewed. Agree with documention and plan.

## 2017-08-27 NOTE — Patient Instructions (Addendum)
Increase Metoprolol XL 50mg  to 1 tablet twice a day and continue Maxzide 37.5 / 25mg  once daily and amlodipine 5mg  once daily. Return on 09/10/17 at 4pm for blood pressure check.  We will contact you within 1 week with your urinalysis and culture results.

## 2017-08-29 ENCOUNTER — Encounter: Payer: Self-pay | Admitting: Family Medicine

## 2017-08-29 LAB — URINALYSIS, ROUTINE W REFLEX MICROSCOPIC
Bilirubin Urine: NEGATIVE
GLUCOSE, UA: NEGATIVE
Hgb urine dipstick: NEGATIVE
Ketones, ur: NEGATIVE
LEUKOCYTES UA: NEGATIVE
NITRITE: NEGATIVE
PH: 6.5 (ref 5.0–8.0)
Protein, ur: NEGATIVE
SPECIFIC GRAVITY, URINE: 1.019 (ref 1.001–1.03)

## 2017-08-29 LAB — URINE CULTURE
MICRO NUMBER: 90406065
SPECIMEN QUALITY:: ADEQUATE

## 2017-08-29 MED ORDER — SULFAMETHOXAZOLE-TRIMETHOPRIM 800-160 MG PO TABS: 1.0000 | ORAL_TABLET | Freq: Two times a day (BID) | ORAL | 0 refills | Status: DC

## 2017-08-29 NOTE — Telephone Encounter (Signed)
Notes recorded by [name], MD on 08/29/2017 at 11:32 AM EDT Have her start Bactrim DS 1 tab po bid x 5 days. She also needs cranberry tabs, probiotics and plenty of fluids.

## 2017-08-30 ENCOUNTER — Encounter: Payer: Self-pay | Admitting: Family Medicine

## 2017-08-30 ENCOUNTER — Telehealth: Payer: Self-pay | Admitting: Family Medicine

## 2017-08-30 NOTE — Telephone Encounter (Signed)
Copied from [place]. Topic: Quick Communication - See Telephone Encounter >> Aug 30, 2017  3:41 PM Rutherford Nail, NT wrote: CRM for notification. See Telephone encounter for: 08/30/17. Patient calling and was inquiring about the patient email sent to Dr. Charlett Blake. She's hoping this could be responded to today and if something different could be called in. If so, could you please send it to [place], [place]

## 2017-09-02 MED ORDER — FLUCONAZOLE 150 MG PO TABS: 150.0000 mg | ORAL_TABLET | ORAL | 0 refills | Status: DC

## 2017-09-02 NOTE — Telephone Encounter (Signed)
Sent mychart message

## 2017-09-06 ENCOUNTER — Encounter: Payer: Self-pay | Admitting: Family Medicine

## 2017-09-06 ENCOUNTER — Telehealth: Payer: Self-pay | Admitting: *Deleted

## 2017-09-06 ENCOUNTER — Encounter: Payer: Self-pay | Admitting: Physician Assistant

## 2017-09-06 NOTE — Telephone Encounter (Signed)
Left message per Richardson Dopp, PA to schedule appt to review BP and meds. See My CHART message.

## 2017-09-06 NOTE — Telephone Encounter (Signed)
Please arrange follow up with me for blood pressure. Richardson Dopp, PA-C    09/06/2017 1:58 PM

## 2017-09-10 ENCOUNTER — Ambulatory Visit (INDEPENDENT_AMBULATORY_CARE_PROVIDER_SITE_OTHER): Payer: Federal, State, Local not specified - PPO | Admitting: Family Medicine

## 2017-09-10 VITALS — BP 130/84 | HR 74

## 2017-09-10 DIAGNOSIS — I10 Essential (primary) hypertension: Secondary | ICD-10-CM

## 2017-09-10 NOTE — Patient Instructions (Signed)
BP looks great here! Bring in BP monitor at your convenience, we can compare readings. Also, low sodium diet, and sit and rest for at least 10-15 minutes prior to checking BPs.

## 2017-09-10 NOTE — Progress Notes (Signed)
Pre visit review using our clinic review tool, if applicable. No additional management support is needed unless otherwise documented below in the visit note.  Pt here today for BP check. At BP check on 08/27/2017 regimen changed to metoprolol XL 50mg  twice daily and to continue Maxzide/hct 37.5/25mg  once daily and amlodipine 5mg  once daily. Pt then sent MyChart message on 09/06/2017 regarding still having elevated BPs- Dr. Charlett Blake recommended increasing amlodipine 5mg  to bid and rechecking in 2 weeks, however MyChart message was not read.   BP today in R arm: 120/77 Pulse: 75  BP today in L arm: 130/84 Pulse: 74  Per Pt at home she just received a new BP monitor from [employer]- using large cuff, is still ranging in upper 150s/100s.  Per Dr. Charlett Blake- continue same medications, bring by BP monitor at Pt's convenience we can compare readings, low sodium diet, rest 10-15 minutes before checking BPs.   Nursing blood pressure check note reviewed. Agree with documention and plan.

## 2017-09-12 DIAGNOSIS — G4733 Obstructive sleep apnea (adult) (pediatric): Secondary | ICD-10-CM | POA: Diagnosis not present

## 2017-09-19 ENCOUNTER — Encounter: Payer: Self-pay | Admitting: Physician Assistant

## 2017-09-20 ENCOUNTER — Other Ambulatory Visit: Payer: Self-pay | Admitting: Family Medicine

## 2017-09-27 ENCOUNTER — Encounter: Payer: Self-pay | Admitting: Medical

## 2017-09-27 ENCOUNTER — Ambulatory Visit: Payer: Federal, State, Local not specified - PPO | Admitting: Medical

## 2017-09-27 ENCOUNTER — Telehealth: Payer: Self-pay | Admitting: Family Medicine

## 2017-09-27 ENCOUNTER — Ambulatory Visit: Payer: Self-pay

## 2017-09-27 VITALS — BP 135/88 | HR 77 | Temp 98.5°F | Resp 16 | Ht 64.0 in | Wt 237.6 lb

## 2017-09-27 DIAGNOSIS — I10 Essential (primary) hypertension: Secondary | ICD-10-CM | POA: Diagnosis not present

## 2017-09-27 DIAGNOSIS — R600 Localized edema: Secondary | ICD-10-CM

## 2017-09-27 DIAGNOSIS — L089 Local infection of the skin and subcutaneous tissue, unspecified: Secondary | ICD-10-CM

## 2017-09-27 DIAGNOSIS — T8189XD Other complications of procedures, not elsewhere classified, subsequent encounter: Secondary | ICD-10-CM

## 2017-09-27 LAB — COMPREHENSIVE METABOLIC PANEL
ALT: 17 U/L (ref 0–35)
AST: 13 U/L (ref 0–37)
Albumin: 4 g/dL (ref 3.5–5.2)
Alkaline Phosphatase: 55 U/L (ref 39–117)
BUN: 12 mg/dL (ref 6–23)
CALCIUM: 9.2 mg/dL (ref 8.4–10.5)
CHLORIDE: 102 meq/L (ref 96–112)
CO2: 29 meq/L (ref 19–32)
Creatinine, Ser: 0.58 mg/dL (ref 0.40–1.20)
GFR: 118.71 mL/min (ref >60.00)
GLUCOSE: 100 mg/dL — AB (ref 70–99)
Potassium: 4.2 mEq/L (ref 3.5–5.1)
Sodium: 138 mEq/L (ref 135–145)
Total Bilirubin: 0.3 mg/dL (ref 0.2–1.2)
Total Protein: 7.1 g/dL (ref 6.0–8.3)

## 2017-09-27 MED ORDER — DOXYCYCLINE HYCLATE 100 MG PO TABS
100.0000 mg | ORAL_TABLET | Freq: Two times a day (BID) | ORAL | 0 refills | Status: DC
Start: 2017-09-27 — End: 2017-10-03

## 2017-09-27 MED ORDER — METOPROLOL SUCCINATE ER 50 MG PO TB24: ORAL_TABLET | ORAL | 2 refills | Status: DC

## 2017-09-27 MED ORDER — FUROSEMIDE 20 MG PO TABS: 20.0000 mg | ORAL_TABLET | Freq: Every day | ORAL | 3 refills | Status: DC

## 2017-09-27 NOTE — Telephone Encounter (Signed)
Patient called in with c/o "foot swelling."  She says "it started this week to my right foot and ankle, some to the left foot. My right calf is tight and I do see some indention, but worse indention to my ankle. There is no pain, no redness to my leg, but my foot looks like it's red." I asked about her BP, she says "I'm at work and didn't check it before I left home." I asked about SOB, fever, chest pain, she denies them all. According to protocol, see within 3 days, no availability with PCP today, appointment scheduled today at 1100 with Mackie Pai, [facility], care advice given, patient verbalized understanding.  Reason for Disposition . [1] MILD swelling of both ankles (i.e., pedal edema) AND [2] new onset or worsening  Answer Assessment - Initial Assessment Questions 1. ONSET: "When did the swelling start?" (e.g., minutes, hours, days)     This week 2. LOCATION: "What part of the leg is swollen?"  "Are both legs swollen or just one leg?"     Right foot and ankle, some on left, right calf tightness 3. SEVERITY: "How bad is the swelling?" (e.g., localized; mild, moderate, severe)  - Localized - small area of swelling localized to one leg  - MILD pedal edema - swelling limited to foot and ankle, pitting edema < 1/4 inch (6 mm) deep, rest and elevation eliminate most or all swelling  - MODERATE edema - swelling of lower leg to knee, pitting edema > 1/4 inch (6 mm) deep, rest and elevation only partially reduce swelling  - SEVERE edema - swelling extends above knee, facial or hand swelling present      Mild to moderate 4. REDNESS: "Does the swelling look red or infected?"    Really hard to tell, the foot may be a different color 5. PAIN: "Is the swelling painful to touch?" If so, ask: "How painful is it?"   (Scale 1-10; mild, moderate or severe)     No 6. FEVER: "Do you have a fever?" If so, ask: "What is it, how was it measured, and when did it start?"      No 7. CAUSE: "What do you think is  causing the leg swelling?"     Usually like that with my BP 8. MEDICAL HISTORY: "Do you have a history of heart failure, kidney disease, liver failure, or cancer?"     No 9. RECURRENT SYMPTOM: "Have you had leg swelling before?" If so, ask: "When was the last time?" "What happened that time?"    No 10. OTHER SYMPTOMS: "Do you have any other symptoms?" (e.g., chest pain, difficulty breathing)       No 11. PREGNANCY: "Is there any chance you are pregnant?" "When was your last menstrual period?"       No  Protocols used: LEG SWELLING AND EDEMA-A-AH

## 2017-09-27 NOTE — Progress Notes (Signed)
Subjective:    Patient ID: Julie Bishop, female    DOB: 1971-02-24, 47 y.o.   MRN: [id number]  HPI  Pt in with some rt foot swelling over last week. On and off swelling of legs in past. Rt side chronic since she was pregnant year ago.  Pt was on hctz in the past for htn. Dr. Charlett Blake switched maxzide in past but swelling still present. Pt also state metoprol increased to metoprol twice daily so she is out of bp medication already. Also on amlodipine low dose. Pt statest that Dr. Charlett Blake in past had mentioned she might need to be on lasix if swelling persisted despite maxide.   Pt also had ganglion cyst on he foot on rt side. Surgery on August 05, 2017. Pt has followed up by podiatrist. Pt states podiatrist did not give her any antibiotic.She states currently she has been released from podiatrist. Over last 1-2 weeks slight yellow appearance to wound. No fever, no chills or seats. The wound never healed and had thick scab which podiatrist debrided the scab.    Review of Systems  Constitutional: Negative for chills, fatigue and fever.  Cardiovascular: Negative for chest pain and palpitations.  Gastrointestinal: Negative for abdominal pain.  Musculoskeletal: Negative for arthralgias, back pain, joint swelling and neck stiffness.       Pedal edema.  Skin: Negative for rash.       See hpi.  Neurological: Negative for dizziness, seizures, light-headedness and headaches.  Hematological: Negative for adenopathy. Does not bruise/bleed easily.  Psychiatric/Behavioral: Negative for behavioral problems, confusion, dysphoric mood and sleep disturbance. The patient is not nervous/anxious.     Past Medical History:  Diagnosis Date  . Adjustment disorder 01/25/2017  . Anemia   . Anxiety and depression   . Back pain   . Cancer ([place]) 2008   skin cancer, BCC  . Constipation 11/14/2014  . Depression   . Generalized anxiety disorder   . GERD (gastroesophageal reflux disease)   . Headache  08/24/2015  . History of cardiovascular stress test    GXT 3/19: No ischemic ECG changes  . History of echocardiogram    Echo 2/18:  EF 50-55, GLS -20% (normal), normal wall motion, grade 1 diastolic dysfunction, trivial MR, mild RVE, normal RV SF, trivial TR, PASP 23  . Hyperlipidemia    Elevated, but never taken medication  . Hypertension   . Insomnia 08/24/2015  . Obesity 11/14/2014  . OSA (obstructive sleep apnea) 09/12/2016  . Pain in joint, shoulder region 11/27/2015  . Palpitations   . Post-menopause 08/23/2015  . Raynauds syndrome   . Sleep apnea    appt with pulmonary Dr in march 2018  . Vitamin B12 deficiency 08/23/2015  . Vitamin D deficiency 08/23/2015     Social History   Socioeconomic History  . Marital status: Married    Spouse name: Alera Quevedo  . Number of children: 1  . Years of education: 71  . Highest education level: Not on file  Occupational History  . 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[employer]  . Financial resource strain: Not on file  . Food insecurity:    Worry: Not on file    Inability: Not on file  . Transportation needs:    Medical: Not on file    Non-medical: Not on file  Tobacco Use  . Smoking status: Never Smoker  . Smokeless tobacco: Never Used  Substance and Sexual Activity  . Alcohol use: No  Alcohol/week: 0.0 oz  . Drug use: No  . Sexual activity: Yes    Partners: Male    Birth control/protection: Post-menopausal    Comment: 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[place], [employer] lives with husband  Lifestyle  . Physical activity:    Days per week: Not on file    Minutes per session: Not on file  . Stress: Not on file  Relationships  . Social connections:    Talks on phone: Not on file    Gets together: Not on file    Attends religious service: Not on file    Active member of club or organization: Not on file    Attends meetings of clubs or organizations: Not on file    Relationship status: Not on  file  . Intimate partner violence:    Fear of current or ex partner: Not on file    Emotionally abused: Not on file    Physically abused: Not on file    Forced sexual activity: Not on file  Other Topics Concern  . Not on file  Social History Narrative  . Not on file    Past Surgical History:  Procedure Laterality Date  . ABDOMINAL HYSTERECTOMY  2010  . BREAST BIOPSY     right, 1996  . ECTOPIC PREGNANCY SURGERY    . SHOULDER OPEN ROTATOR CUFF REPAIR  2017   right  . TUBAL LIGATION     on right  . TYMPANOSTOMY TUBE PLACEMENT      Family History  Problem Relation Age of Onset  . Diabetes Mother   . Depression Mother   . Hypertension Mother   . Hyperlipidemia Mother   . Liver disease Mother   . Sleep apnea Mother   . Obesity Mother   . Heart attack Mother 27       CAD w/MI in mid 91s; s/p PCI  . Diabetes Father   . Hypertension Father   . Cancer Maternal Grandmother        throat  . Heart attack Maternal Grandmother   . Cancer Paternal Grandmother        lung  . Heart disease Paternal Grandmother   . Hypertension Sister        as a teenager    Allergies  Allergen Reactions  . Losartan Other (See Comments)    Diarrhea and abodminal cramping  . Oxycodone Itching    Current Outpatient Medications on File Prior to Visit  Medication Sig Dispense Refill  . amLODipine (NORVASC) 5 MG tablet TAKE ONE TABLET BY MOUTH DAILY 90 tablet 0  . DULoxetine (CYMBALTA) 30 MG capsule Take 30 mg by mouth 3 (three) times daily.     Marland Kitchen LORazepam (ATIVAN) 0.5 MG tablet Take 0.5 mg by mouth daily.     . metoprolol succinate (TOPROL-XL) 50 MG 24 hr tablet TAKE 1 TABLET BY MOUTH DAILY. TAKE WITH, OR IMMEDIATELY FOLLOWING, A MEAL. 30 tablet 2  . ranitidine (ZANTAC) 300 MG tablet Take 1 tablet (300 mg total) by mouth at bedtime as needed for heartburn. 30 tablet 3  . triamterene-hydrochlorothiazide (MAXZIDE-25) 37.5-25 MG tablet TAKE ONE TABLET BY MOUTH DAILY 90 tablet 0   No current  facility-administered medications on file prior to visit.     BP 135/88   Pulse 77   Temp 98.5 F (36.9 C) (Oral)   Resp 16   Ht 5\' 4"  (1.626 m)   Wt 237 lb 9.6 oz (107.8 kg)   SpO2 100%  BMI 40.78 kg/m       Objective:   Physical Exam  General- No acute distress. Pleasant patient. Neck- Full range of motion, no jvd Lungs- Clear, even and unlabored. Heart- regular rate and rhythm. Neurologic- CNII- XII grossly intact.  Rt lower ext- negative homans signs. 1+ pedal edema of foot. Rt lateral foot 2 cm x 1 cm nonhealing shallow wound. Faint creamy yellow dc. Mid red around edges.      Assessment & Plan:  For your borderlin high blood pressure today and chronic pedal edema, I want you to continue with metoprolol 50 mg twice daily and amlodipine 5 mg daily.  You  had mentioned that Dr. Charlett Blake had considered switching you off of Maxide and onto Lasix.  So I will prescribe you Lasix 20 mg daily and advise you to stop Maxide.  Check your blood pressure daily and if your blood pressure is increasing compared to today's level notify me by my chart and would advise increasing Lasix to 40 mg daily.  Recommend eating a banana a day to maintain good potassium level.  Also get CMP today.  Your right foot wound may be infected.  This might be delaying the healing process.  I did get a wound culture today and prescribe doxycycline.  Rx advisement given.  Follow-up in 7 days for wound check and blood pressure check.  PRN as well.  Mackie Pai, PA-C

## 2017-09-27 NOTE — Addendum Note (Signed)
Addended by: Harl Bowie on: 09/27/2017 12:20 PM   Modules accepted: Orders

## 2017-09-27 NOTE — Patient Instructions (Addendum)
For your borderlin high blood pressure today and chronic pedal edema, I want you to continue with metoprolol 50 mg twice daily and amlodipine 5 mg daily.  You  had mentioned that Dr. Charlett Blake had considered switching you off of Maxide and onto Lasix.  So I will prescribe you Lasix 20 mg daily and advise you to stop Maxide.  Check your blood pressure daily and if your blood pressure is increasing compared to today's level notify me by my chart and would advise increasing Lasix to 40 mg daily.  Recommend eating a banana a day to maintain good potassium level.  Also get CMP today.  Your right foot wound may be infected.  This might be delaying the healing process.  I did get a wound culture today and prescribe doxycycline.  Rx advisement given.  Follow-up in 7 days for wound check and blood pressure check.  PRN as well.

## 2017-09-27 NOTE — Telephone Encounter (Signed)
Copied from [place] [id number]. Topic: Quick Communication - Rx Refill/Question >> Sep 27, 2017 10:04 AM Robina Ade, Helene Kelp D wrote: Medication: metoprolol succinate (TOPROL-XL) 50 MG 24 hr tablet Has the patient contacted their pharmacy? Yes, pt completely out because Dr. Charlett Blake increase med but did not send it new RX for patient. (Agent: If no, request that the patient contact the pharmacy for the refill.) Preferred Pharmacy (with phone number or street name): [facility] [place], [place]: Please be advised that RX refills may take up to 3 business days. We ask that you follow-up with your pharmacy.

## 2017-09-27 NOTE — Telephone Encounter (Signed)
Mediation filled on 09/27/17

## 2017-09-29 ENCOUNTER — Encounter: Payer: Self-pay | Admitting: Medical

## 2017-10-01 ENCOUNTER — Encounter: Payer: Self-pay | Admitting: Medical

## 2017-10-01 LAB — WOUND CULTURE
MICRO NUMBER:: 90542590
SPECIMEN QUALITY: ADEQUATE

## 2017-10-03 ENCOUNTER — Encounter: Payer: Self-pay | Admitting: Medical

## 2017-10-03 ENCOUNTER — Ambulatory Visit (HOSPITAL_BASED_OUTPATIENT_CLINIC_OR_DEPARTMENT_OTHER)
Admission: RE | Admit: 2017-10-03 | Discharge: 2017-10-03 | Disposition: A | Discharge status: Home / Self Care | Payer: Federal, State, Local not specified - PPO | Source: Ambulatory Visit | Attending: Medical | Admitting: Medical

## 2017-10-03 ENCOUNTER — Ambulatory Visit: Payer: Federal, State, Local not specified - PPO | Admitting: Medical

## 2017-10-03 VITALS — BP 135/85 | HR 69 | Temp 98.1°F | Resp 16 | Ht 64.0 in | Wt 240.2 lb

## 2017-10-03 DIAGNOSIS — I10 Essential (primary) hypertension: Secondary | ICD-10-CM

## 2017-10-03 DIAGNOSIS — R0602 Shortness of breath: Secondary | ICD-10-CM | POA: Diagnosis not present

## 2017-10-03 DIAGNOSIS — R0600 Dyspnea, unspecified: Secondary | ICD-10-CM

## 2017-10-03 DIAGNOSIS — R600 Localized edema: Secondary | ICD-10-CM | POA: Diagnosis not present

## 2017-10-03 DIAGNOSIS — R059 Cough, unspecified: Secondary | ICD-10-CM

## 2017-10-03 DIAGNOSIS — R232 Flushing: Secondary | ICD-10-CM | POA: Diagnosis not present

## 2017-10-03 DIAGNOSIS — T8189XD Other complications of procedures, not elsewhere classified, subsequent encounter: Secondary | ICD-10-CM

## 2017-10-03 DIAGNOSIS — R05 Cough: Secondary | ICD-10-CM | POA: Insufficient documentation

## 2017-10-03 LAB — BRAIN NATRIURETIC PEPTIDE: Pro B Natriuretic peptide (BNP): 41 pg/mL (ref 0.0–100.0)

## 2017-10-03 LAB — FOLLICLE STIMULATING HORMONE: FSH: 5.9 m[IU]/mL

## 2017-10-03 MED ORDER — DOXYCYCLINE HYCLATE 100 MG PO TABS: 100.0000 mg | ORAL_TABLET | Freq: Two times a day (BID) | ORAL | 0 refills | Status: DC

## 2017-10-03 MED ORDER — SULFAMETHOXAZOLE-TRIMETHOPRIM 800-160 MG PO TABS: 1.0000 | ORAL_TABLET | Freq: Two times a day (BID) | ORAL | 0 refills | Status: DC

## 2017-10-03 NOTE — Patient Instructions (Signed)
For delayed chronic wound healing, I am going to add 5 day bactrim to your doxycycline regimen. Update me in 5 days and send my chart. If not better then refer to wound care. Your wound did dry up some and shrink some after doxycycline.  For bp continue same regimen. Keep checking bp.  Get fsh today for hot flashes.  Probable dependent edema. Will get bnp and cxr to rule out chf. Follow test and keep feet elevated. If study negative consider ted hose compression stocking. Any poplitial pain let me know.  Follow up date to be determined after lab and cxr review.

## 2017-10-03 NOTE — Progress Notes (Signed)
Subjective:    Patient ID: Julie Bishop, female    DOB: Jun 04, 1970, 47 y.o.   MRN: [id number]  HPI  Pt in for rt foot scab lateral aspect. This is chronic wound since August 05, 2017. Recent culture came back growth of skin flora. Gram stain did comment few positive cocci in pairs.  Pt reporting some swelling in both feet at the end of the day. Faint brief shortness of breath for a minute or so then went away. Has not returned and not on activity.  Pt bp at home have been, 136/96, 136/103, 125/80, 134/94, 132/93, 119/85, 127/81, 134/90 and 125/87. Pt has started lasix.   Review of Systems  Constitutional: Negative for chills, fatigue and fever.  Respiratory: Negative for cough, chest tightness and shortness of breath.   Cardiovascular: Negative for chest pain and palpitations.  Gastrointestinal: Negative for abdominal pain.  Genitourinary: Negative for dysuria and frequency.  Musculoskeletal: Negative for arthralgias, back pain, gait problem and neck pain.  Skin: Negative for rash.       Rt foot lateral aspect scab.  Neurological: Negative for dizziness, seizures, weakness, numbness and headaches.  Hematological: Negative for adenopathy. Does not bruise/bleed easily.  Psychiatric/Behavioral: Negative for behavioral problems and confusion.   Past Medical History:  Diagnosis Date  . Adjustment disorder 01/25/2017  . Anemia   . Anxiety and depression   . Back pain   . Cancer ([place]) 2008   skin cancer, BCC  . Constipation 11/14/2014  . Depression   . Generalized anxiety disorder   . GERD (gastroesophageal reflux disease)   . Headache 08/24/2015  . History of cardiovascular stress test    GXT 3/19: No ischemic ECG changes  . History of echocardiogram    Echo 2/18:  EF 50-55, GLS -20% (normal), normal wall motion, grade 1 diastolic dysfunction, trivial MR, mild RVE, normal RV SF, trivial TR, PASP 23  . Hyperlipidemia    Elevated, but never taken medication  . Hypertension    . Insomnia 08/24/2015  . Obesity 11/14/2014  . OSA (obstructive sleep apnea) 09/12/2016  . Pain in joint, shoulder region 11/27/2015  . Palpitations   . Post-menopause 08/23/2015  . Raynauds syndrome   . Sleep apnea    appt with pulmonary Dr in march 2018  . Vitamin B12 deficiency 08/23/2015  . Vitamin D deficiency 08/23/2015     Social History   Socioeconomic History  . Marital status: Married    Spouse name: Maddilynn Esperanza  . Number of children: 1  . Years of education: 9  . Highest education level: Not on file  Occupational History  . 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 Financial resource strain: Not on file  . Food insecurity:    Worry: Not on file    Inability: Not on file  . Transportation needs:    Medical: Not on file    Non-medical: Not on file  Tobacco Use  . Smoking status: Never Smoker  . Smokeless tobacco: Never Used  Substance and Sexual Activity  . Alcohol use: No    Alcohol/week: 0.0 oz  . Drug use: No  . Sexual activity: Yes    Partners: Male    Birth control/protection: Post-menopausal    Comment: 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[place], [employer] lives with husband  Lifestyle  . Physical activity:    Days per week: Not on file    Minutes per session: Not on file  . Stress:  Not on file  Relationships  . Social connections:    Talks on phone: Not on file    Gets together: Not on file    Attends religious service: Not on file    Active member of club or organization: Not on file    Attends meetings of clubs or organizations: Not on file    Relationship status: Not on file  . Intimate partner violence:    Fear of current or ex partner: Not on file    Emotionally abused: Not on file    Physically abused: Not on file    Forced sexual activity: Not on file  Other Topics Concern  . Not on file  Social History Narrative  . Not on file    Past Surgical History:  Procedure Laterality Date  . ABDOMINAL  HYSTERECTOMY  2010  . BREAST BIOPSY     right, 1996  . ECTOPIC PREGNANCY SURGERY    . SHOULDER OPEN ROTATOR CUFF REPAIR  2017   right  . TUBAL LIGATION     on right  . TYMPANOSTOMY TUBE PLACEMENT      Family History  Problem Relation Age of Onset  . Diabetes Mother   . Depression Mother   . Hypertension Mother   . Hyperlipidemia Mother   . Liver disease Mother   . Sleep apnea Mother   . Obesity Mother   . Heart attack Mother 51       CAD w/MI in mid 62s; s/p PCI  . Diabetes Father   . Hypertension Father   . Cancer Maternal Grandmother        throat  . Heart attack Maternal Grandmother   . Cancer Paternal Grandmother        lung  . Heart disease Paternal Grandmother   . Hypertension Sister        as a teenager    Allergies  Allergen Reactions  . Losartan Other (See Comments)    Diarrhea and abodminal cramping  . Oxycodone Itching    Current Outpatient Medications on File Prior to Visit  Medication Sig Dispense Refill  . amLODipine (NORVASC) 5 MG tablet TAKE ONE TABLET BY MOUTH DAILY 90 tablet 0  . doxycycline (VIBRA-TABS) 100 MG tablet Take 1 tablet (100 mg total) by mouth 2 (two) times daily. Can give caps or generic 20 tablet 0  . DULoxetine (CYMBALTA) 30 MG capsule Take 30 mg by mouth 3 (three) times daily.     . furosemide (LASIX) 20 MG tablet Take 1 tablet (20 mg total) by mouth daily. 14 tablet 3  . LORazepam (ATIVAN) 0.5 MG tablet Take 0.5 mg by mouth daily.     . metoprolol succinate (TOPROL-XL) 50 MG 24 hr tablet 1 tab po bid 60 tablet 2  . ranitidine (ZANTAC) 300 MG tablet Take 1 tablet (300 mg total) by mouth at bedtime as needed for heartburn. 30 tablet 3   No current facility-administered medications on file prior to visit.     BP (!) 154/71   Pulse 69   Temp 98.1 F (36.7 C)   Resp 16   Ht 5\' 4"  (1.626 m)   Wt 240 lb 3.2 oz (109 kg)   SpO2 100%   BMI 41.23 kg/m       Objective:   Physical Exam  General Mental Status- Alert. General  Appearance- Not in acute distress.   Skin General: Color- Normal Color. Moisture- Normal Moisture.  Neck Carotid Arteries- Normal color. Moisture- Normal Moisture. No  carotid bruits. No JVD.  Chest and Lung Exam Auscultation: Breath Sounds:-Normal.  Cardiovascular Auscultation:Rythm- Regular. Murmurs & Other Heart Sounds:Auscultation of the heart reveals- No Murmurs.  Abdomen Inspection:-Inspeection Normal. Palpation/Percussion:Note:No mass. Palpation and Percussion of the abdomen reveal- Non Tender, Non Distended + BS, no rebound or guarding.   Neurologic Cranial Nerve exam:- CN III-XII intact(No nystagmus), symmetric smile. Strength:- 5/5 equal and symmetric strength both upper and lower extremities.  Right foot- the scab present on the foot looks dry your than before.  Also appears to have decreased in size.  There is no treatment discharge.  No surrounding redness.  Bilateral lower extremity- 1+ edema at best.  Negative Homans sign bilaterally.     Assessment & Plan:  For delayed chronic wound healing, I am going to add 5 day bactrim to your doxycycline regimen. Update me in 5 days and send my chart. If not better then refer to wound care. Your wound did dry up some and shrink some after doxycycline.  For bp continue same regimen. Keep checking bp.  Get fsh today for hot flashes.  Probable dependent edema. Will get bnp and cxr to rule out chf. Follow test and keep feet elevated. If study negative consider ted hose compression stocking. Any poplitial pain let me know.  Follow up date to be determined after lab and cxr review.  Mackie Pai, PA-C

## 2017-10-04 ENCOUNTER — Ambulatory Visit: Payer: Federal, State, Local not specified - PPO | Admitting: Medical

## 2017-10-07 NOTE — Progress Notes (Signed)
Cardiology Office Note:    Date:  10/08/2017   ID:  Julie Bishop, DOB Nov 14, 1970, MRN [id number]  PCP:  [name], MD  Cardiologist:  Constance Haw, MD   Referring MD: [name], MD   Chief Complaint  Patient presents with  . Follow-up    Blood Pressure    History of Present Illness:    Julie Bishop is a 47 y.o. female with hypertension, hyperlipidemia, obstructive sleep apnea.  She was last seen in 3/19 for the evaluation of chest pain.  GXT was arranged and this was normal.    Julie Bishop returns for evaluation of blood pressure.  She is here alone.  She had her R foot surgery in March.  Over the past few weeks, she has had increased swelling in her R leg.  Her foot wound has been slow to heal. Her PCP has started her on antibiotics to cover for infection.  She has minimal shortness of breath with activity.  She denies chest pain.  She sleeps with CPAP.  She denies paroxysmal nocturnal dyspnea, syncope or significant palpitations.   Prior CV studies:   The following studies were reviewed today:  GXT 08/01/17  There was no ST segment deviation noted during stress.  Clinically and electrically negative for ischemia.  Blood pressure demonstrated a normal response to exercise.  Echo 07/04/16 EF 50-55, GLS -20% (normal), normal wall motion, grade 1 diastolic dysfunction, trivial MR, mild RVE, normal RV SF, trivial TR, PASP 23  Past Medical History:  Diagnosis Date  . Adjustment disorder 01/25/2017  . Anemia   . Anxiety and depression   . Back pain   . Cancer ([place]) 2008   skin cancer, BCC  . Constipation 11/14/2014  . Depression   . Generalized anxiety disorder   . GERD (gastroesophageal reflux disease)   . Headache 08/24/2015  . History of cardiovascular stress test    GXT 3/19: No ischemic ECG changes  . History of echocardiogram    Echo 2/18:  EF 50-55, GLS -20% (normal), normal wall motion, grade 1 diastolic dysfunction, trivial MR,  mild RVE, normal RV SF, trivial TR, PASP 23  . Hyperlipidemia    Elevated, but never taken medication  . Hypertension   . Insomnia 08/24/2015  . Obesity 11/14/2014  . OSA (obstructive sleep apnea) 09/12/2016  . Pain in joint, shoulder region 11/27/2015  . Palpitations   . Post-menopause 08/23/2015  . Raynauds syndrome   . Sleep apnea    appt with pulmonary Dr in march 2018  . Vitamin B12 deficiency 08/23/2015  . Vitamin D deficiency 08/23/2015   Surgical Hx: The patient  has a past surgical history that includes Abdominal hysterectomy (2010); Ectopic pregnancy surgery; Breast biopsy; Tubal ligation; Shoulder open rotator cuff repair (2017); and Tympanostomy tube placement.   Current Medications: Current Meds  Medication Sig  . amLODipine (NORVASC) 5 MG tablet TAKE ONE TABLET BY MOUTH DAILY  . doxycycline (VIBRA-TABS) 100 MG tablet Take 1 tablet (100 mg total) by mouth 2 (two) times daily.  . DULoxetine (CYMBALTA) 30 MG capsule Take 30 mg by mouth 3 (three) times daily.   Marland Kitchen LORazepam (ATIVAN) 0.5 MG tablet Take 0.5 mg by mouth daily.   . metoprolol succinate (TOPROL-XL) 50 MG 24 hr tablet 1 tab po bid  . ranitidine (ZANTAC) 300 MG tablet Take 1 tablet (300 mg total) by mouth at bedtime as needed for heartburn.  . sulfamethoxazole-trimethoprim (BACTRIM DS) 800-160 MG tablet Take  1 tablet by mouth 2 (two) times daily.  . [DISCONTINUED] furosemide (LASIX) 20 MG tablet Take 1 tablet (20 mg total) by mouth daily.     Allergies:   Losartan and Oxycodone   Social History   Tobacco Use  . Smoking status: Never Smoker  . Smokeless tobacco: Never Used  Substance Use Topics  . Alcohol use: No    Alcohol/week: 0.0 oz  . Drug use: No     Family Hx: The patient's family history includes Cancer in her maternal grandmother and paternal grandmother; Depression in her mother; Diabetes in her father and mother; Heart attack in her maternal grandmother; Heart attack (age of onset: 78) in her mother;  Heart disease in her paternal grandmother; Hyperlipidemia in her mother; Hypertension in her father, mother, and sister; Liver disease in her mother; Obesity in her mother; Sleep apnea in her mother.  ROS:   Please see the history of present illness.    Review of Systems  Cardiovascular: Positive for dyspnea on exertion and leg swelling.   All other systems reviewed and are negative.   EKGs/Labs/Other Test Reviewed:    EKG:  EKG is not ordered today.    Recent Labs: 05/02/2017: Hemoglobin 13.0; Platelets 344.0; TSH 1.96 09/27/2017: ALT 17; BUN 12; Creatinine, Ser 0.58; Potassium 4.2; Sodium 138 10/03/2017: Pro B Natriuretic peptide (BNP) 41.0   Recent Lipid Panel Lab Results  Component Value Date/Time   CHOL 212 (H) 05/02/2017 05:24 PM   CHOL 226 (H) 07/12/2016 10:58 AM   TRIG 118.0 05/02/2017 05:24 PM   HDL 76.70 05/02/2017 05:24 PM   HDL 73 07/12/2016 10:58 AM   CHOLHDL 3 05/02/2017 05:24 PM   LDLCALC 111 (H) 05/02/2017 05:24 PM   LDLCALC 134 (H) 07/12/2016 10:58 AM    Physical Exam:    VS:  BP 140/90   Pulse 77   Ht 5\' 4"  (1.626 m)   Wt 233 lb (105.7 kg)   SpO2 99%   BMI 39.99 kg/m     Wt Readings from Last 3 Encounters:  10/08/17 233 lb (105.7 kg)  10/03/17 240 lb 3.2 oz (109 kg)  09/27/17 237 lb 9.6 oz (107.8 kg)     Physical Exam  Constitutional: She is oriented to person, place, and time. She appears well-developed and well-nourished. No distress.  HENT:  Head: Normocephalic and atraumatic.  Neck: Neck supple. No JVD present.  Cardiovascular: Normal rate, regular rhythm, S1 normal, S2 normal and normal heart sounds.  No murmur heard. Pulmonary/Chest: Effort normal. She has no rales.  Abdominal: Soft. There is no hepatomegaly.  Musculoskeletal: She exhibits edema (1+ R ankle edema; trace L ankle edema).  Neurological: She is alert and oriented to person, place, and time.  Skin: Skin is warm and dry.    ASSESSMENT & PLAN:    Essential hypertension Her  BP has remained uncontrolled.  She has seen readings as high as 150/100.  She has a hx of cough with Lisinopril but tolerated Enalapril well.  She had GI side effects with Losartan.  She was taken off of Triamterene/HCTZ when her PCP put her on Lasix to help with her leg swelling.  Question if Amlodipine is contributing to her swelling.  She is willing to try Enalapril again.    -DC Amlodipine  -DC Lasix; may continue PRN  -Start Enalapril 5/12.5 mg Once daily   -BMET 2 weeks  -Track BP and bring to next appt  R leg swelling She had surgery several weeks  ago.  I doubt she has a DVT.  But, the swelling in her R leg is quite severe at the end of the day.  She showed me a photo on her phone.  Will get R leg venous duplex.  Stop Amlodipine.  If [place] is neg and edema does not improve with being off of Amlodipine, it may be worthwhile getting back to the surgeon to look at her foot.  -Obtain R lower extremity ultrasound to r/o DVT  -Stop Amlodipine  -If swelling continues, consider follow up with foot surgeon  Sleep apnea She remains on CPAP.   Dispo:  Return in about 2 weeks (around 10/22/2017) for Routine Follow Up, w/ Richardson Dopp, PA-C.   Medication Adjustments/Labs and Tests Ordered: Current medicines are reviewed at length with the patient today.  Concerns regarding medicines are outlined above.  Tests Ordered: Orders Placed This Encounter  Procedures  . Basic Metabolic Panel (BMET)   Medication Changes: Meds ordered this encounter  Medications  . Enalapril-hydroCHLOROthiazide 5-12.5 MG tablet    Sig: Take 1 tablet by mouth daily.    Dispense:  90 tablet    Refill:  3    CHANGE IN THERAPY; STOP AMLODIPINE  . furosemide (LASIX) 20 MG tablet    Sig: Take 1 tablet (20 mg total) by mouth as needed for edema.    Dispense:  30 tablet    Refill:  6    CHANGE IN DIRECTIONS    Signed, Richardson Dopp, PA-C  10/08/2017 9:28 AM    [facility] [place],  [place], [place]  [address] Phone: [phone]; Fax: [phone]

## 2017-10-08 ENCOUNTER — Other Ambulatory Visit: Payer: Self-pay | Admitting: Physician Assistant

## 2017-10-08 ENCOUNTER — Ambulatory Visit: Payer: Federal, State, Local not specified - PPO | Admitting: Physician Assistant

## 2017-10-08 ENCOUNTER — Encounter: Payer: Self-pay | Admitting: Physician Assistant

## 2017-10-08 VITALS — BP 140/90 | HR 77 | Ht 64.0 in | Wt 233.0 lb

## 2017-10-08 DIAGNOSIS — R601 Generalized edema: Secondary | ICD-10-CM

## 2017-10-08 DIAGNOSIS — G4733 Obstructive sleep apnea (adult) (pediatric): Secondary | ICD-10-CM | POA: Diagnosis not present

## 2017-10-08 DIAGNOSIS — I10 Essential (primary) hypertension: Secondary | ICD-10-CM

## 2017-10-08 DIAGNOSIS — M7989 Other specified soft tissue disorders: Secondary | ICD-10-CM | POA: Diagnosis not present

## 2017-10-08 MED ORDER — FUROSEMIDE 20 MG PO TABS: 20.0000 mg | ORAL_TABLET | ORAL | 6 refills | Status: DC | PRN

## 2017-10-08 MED ORDER — ENALAPRIL-HYDROCHLOROTHIAZIDE 5-12.5 MG PO TABS: 1.0000 | ORAL_TABLET | Freq: Every day | ORAL | 3 refills | Status: DC

## 2017-10-08 NOTE — Patient Instructions (Addendum)
Medication Instructions:  1. STOP AMLODIPINE   2. STOP LASIX DAILY AND ONLY TAKE AS NEEDED FOR SWELLING   3. START ENALAPRIL/HCTZ 5/12.5 MG DAILY; RX HAS BEEN SENT IN  Labwork: BMET TO BE DONE IN 2 WEEKS ; SAME DAY YOU SEE SCOTT WEAVER, PAC  Testing/Procedures: Your physician has requested that you have a lower extremity arterial duplex. This test is an ultrasound of the arteries in the legs or arms. It looks at arterial blood flow in the legs and arms. Allow one hour for Lower and Upper Arterial scans. There are no restrictions or special instructions   Follow-Up: [facility] 2 WEEKS   Any Other Special Instructions Will Be Listed Below (If Applicable).   BRING A LIST BLOOD PRESSURE READINGS TO YOUR NEXT APPT   If you need a refill on your cardiac medications before your next appointment, please call your pharmacy.

## 2017-10-12 DIAGNOSIS — G4733 Obstructive sleep apnea (adult) (pediatric): Secondary | ICD-10-CM | POA: Diagnosis not present

## 2017-10-14 ENCOUNTER — Encounter: Payer: Self-pay | Admitting: Medical

## 2017-10-14 ENCOUNTER — Inpatient Hospital Stay (HOSPITAL_COMMUNITY): Admission: RE | Admit: 2017-10-14 | Payer: Federal, State, Local not specified - PPO | Source: Ambulatory Visit

## 2017-10-14 ENCOUNTER — Telehealth: Payer: Self-pay | Admitting: Physician Assistant

## 2017-10-14 ENCOUNTER — Encounter: Payer: Self-pay | Admitting: Physician Assistant

## 2017-10-14 NOTE — Telephone Encounter (Signed)
Patient contacted me via [place]. Her swelling has resolved and she wants to cancel the lower extremity venous ultrasound. Please cancel this test. Richardson Dopp, PA-C    10/14/2017 1:15 PM

## 2017-10-14 NOTE — Telephone Encounter (Signed)
Testing has been cancelled

## 2017-10-15 NOTE — Telephone Encounter (Signed)
Ultrasound has been cancelled

## 2017-10-21 NOTE — Progress Notes (Signed)
Cardiology Office Note:    Date:  10/22/2017   ID:  Julie Bishop, DOB [birth year], MRN [id number]  PCP:  Julie Lukes, MD  Cardiologist:  Julie Haw, MD   Referring MD: Julie Lukes, MD   Chief Complaint  Patient presents with  . Follow-up    blood pressure    History of Present Illness:    Julie Bishop is a 47 y.o. female with hypertension, hyperlipidemia, obstructive sleep apnea. She was last seen 10/08/17.    Julie Bishop returns for follow up on her blood pressure.  She is here alone.  She is doing well.  She denies significant headache, chest pain, shortness of breath, leg swelling.  Prior CV studies:   The following studies were reviewed today:   GXT 08/01/17  There was no ST segment deviation noted during stress.  Clinically and electrically negative for ischemia.  Blood pressure demonstrated a normal response to exercise.  Echo 07/04/16 EF 50-55, GLS -20% (normal), normal wall motion, grade 1 diastolic dysfunction, trivial MR, mild RVE, normal RV SF, trivial TR, PASP 23   Past Medical History:  Diagnosis Date  . Adjustment disorder 01/25/2017  . Anemia   . Anxiety and depression   . Back pain   . Cancer ([place]) 2008   skin cancer, BCC  . Constipation 11/14/2014  . Depression   . Generalized anxiety disorder   . GERD (gastroesophageal reflux disease)   . Headache 08/24/2015  . History of cardiovascular stress test    GXT 3/19: No ischemic ECG changes  . History of echocardiogram    Echo 2/18:  EF 50-55, GLS -20% (normal), normal wall motion, grade 1 diastolic dysfunction, trivial MR, mild RVE, normal RV SF, trivial TR, PASP 23  . Hyperlipidemia    Elevated, but never taken medication  . Hypertension   . Insomnia 08/24/2015  . Obesity 11/14/2014  . OSA (obstructive sleep apnea) 09/12/2016  . Pain in joint, shoulder region 11/27/2015  . Palpitations   . Post-menopause 08/23/2015  . Raynauds syndrome   . Sleep apnea    appt with  pulmonary Dr in march 2018  . Vitamin B12 deficiency 08/23/2015  . Vitamin D deficiency 08/23/2015   Surgical Hx: The patient  has a past surgical history that includes Abdominal hysterectomy (2010); Ectopic pregnancy surgery; Breast biopsy; Tubal ligation; Shoulder open rotator cuff repair (2017); and Tympanostomy tube placement.   Current Medications: Current Meds  Medication Sig  . doxycycline (VIBRA-TABS) 100 MG tablet Take 1 tablet (100 mg total) by mouth 2 (two) times daily.  . DULoxetine (CYMBALTA) 30 MG capsule Take 30 mg by mouth 3 (three) times daily.   . Enalapril-hydroCHLOROthiazide 5-12.5 MG tablet Take 1 tablet by mouth daily.  . furosemide (LASIX) 20 MG tablet Take 1 tablet (20 mg total) by mouth as needed for edema.  . metoprolol succinate (TOPROL-XL) 50 MG 24 hr tablet 1 tab po bid  . ranitidine (ZANTAC) 300 MG tablet Take 1 tablet (300 mg total) by mouth at bedtime as needed for heartburn.     Allergies:   Losartan and Oxycodone   Social History   Tobacco Use  . Smoking status: Never Smoker  . Smokeless tobacco: Never Used  Substance Use Topics  . Alcohol use: No    Alcohol/week: 0.0 oz  . Drug use: No     Family Hx: The patient's family history includes Cancer in her maternal grandmother and paternal grandmother; Depression in her mother; Diabetes  in her father and mother; Heart attack in her maternal grandmother; Heart attack (age of onset: 72) in her mother; Heart disease in her paternal grandmother; Hyperlipidemia in her mother; Hypertension in her father, mother, and sister; Liver disease in her mother; Obesity in her mother; Sleep apnea in her mother.  ROS:   Please see the history of present illness.    ROS All other systems reviewed and are negative.   EKGs/Labs/Other Test Reviewed:    EKG:  EKG is not ordered today.    Recent Labs: 05/02/2017: Hemoglobin 13.0; Platelets 344.0; TSH 1.96 09/27/2017: ALT 17; BUN 12; Creatinine, Ser 0.58; Potassium 4.2;  Sodium 138 10/03/2017: Pro B Natriuretic peptide (BNP) 41.0   Recent Lipid Panel Lab Results  Component Value Date/Time   CHOL 212 (H) 05/02/2017 05:24 PM   CHOL 226 (H) 07/12/2016 10:58 AM   TRIG 118.0 05/02/2017 05:24 PM   HDL 76.70 05/02/2017 05:24 PM   HDL 73 07/12/2016 10:58 AM   CHOLHDL 3 05/02/2017 05:24 PM   LDLCALC 111 (H) 05/02/2017 05:24 PM   LDLCALC 134 (H) 07/12/2016 10:58 AM    Physical Exam:    VS:  BP 120/88   Pulse 60   Ht 5\' 4"  (1.626 m)   Wt 224 lb 12.8 oz (102 kg)   SpO2 98%   BMI 38.59 kg/m     Wt Readings from Last 3 Encounters:  10/22/17 224 lb 12.8 oz (102 kg)  10/08/17 233 lb (105.7 kg)  10/03/17 240 lb 3.2 oz (109 kg)     Physical Exam  Constitutional: She is oriented to person, place, and time. She appears well-developed and well-nourished. No distress.  HENT:  Head: Normocephalic and atraumatic.  Neck: Neck supple.  Cardiovascular: Normal rate, regular rhythm, S1 normal, S2 normal and normal heart sounds.  No murmur heard. Pulmonary/Chest: Effort normal. She has no rales.  Abdominal: Soft.  Musculoskeletal: She exhibits no edema.  Neurological: She is alert and oriented to person, place, and time.  Skin: Skin is warm and dry.    ASSESSMENT & PLAN:    Essential hypertension  She brings in a list of her blood pressures.  Her systolic pressure is at goal.  Her diastolic pressure has been in the low to mid 80s.  She has had a couple of readings of 90 and 95.  She has been working on weight loss and has lost 9 pounds since her last visit.  She feels much better.  At this point, I have recommended continuing her current medical therapy.  With continued weight loss, her pressure should become more optimal.  If her diastolic remains above target, I would recommend adding HCTZ 12.5 mg to her current regimen.  Follow-up BMET today.  Follow-up in 6 months.   Dispo:  Return in about 6 months (around 04/24/2018) for Routine Follow Up, w/ Julie Dopp,  PA-C.   Medication Adjustments/Labs and Tests Ordered: Current medicines are reviewed at length with the patient today.  Concerns regarding medicines are outlined above.  Tests Ordered: No orders of the defined types were placed in this encounter.  Medication Changes: No orders of the defined types were placed in this encounter.   Signed, Julie Dopp, PA-C  10/22/2017 9:10 AM    [facility] [place], [place], [place]  [address] Phone: [phone]; Fax: [phone]

## 2017-10-22 ENCOUNTER — Encounter: Payer: Self-pay | Admitting: Physician Assistant

## 2017-10-22 ENCOUNTER — Ambulatory Visit: Payer: Federal, State, Local not specified - PPO | Admitting: Physician Assistant

## 2017-10-22 ENCOUNTER — Other Ambulatory Visit: Payer: Federal, State, Local not specified - PPO | Admitting: *Deleted

## 2017-10-22 VITALS — BP 120/88 | HR 60 | Ht 64.0 in | Wt 224.8 lb

## 2017-10-22 DIAGNOSIS — I10 Essential (primary) hypertension: Secondary | ICD-10-CM

## 2017-10-22 LAB — BASIC METABOLIC PANEL
BUN/Creatinine Ratio: 22 (ref 9–23)
BUN: 15 mg/dL (ref 6–24)
CALCIUM: 9.7 mg/dL (ref 8.7–10.2)
CO2: 22 mmol/L (ref 20–29)
Chloride: 100 mmol/L (ref 96–106)
Creatinine, Ser: 0.68 mg/dL (ref 0.57–1.00)
GFR calc Af Amer: 121 mL/min/{1.73_m2} (ref >59)
GFR calc non Af Amer: 105 mL/min/{1.73_m2} (ref >59)
Glucose: 96 mg/dL (ref 65–99)
POTASSIUM: 3.7 mmol/L (ref 3.5–5.2)
SODIUM: 138 mmol/L (ref 134–144)

## 2017-10-22 NOTE — Patient Instructions (Signed)
Medication Instructions:  1. Your physician recommends that you continue on your current medications as directed. Please refer to the Current Medication list given to you today.   Labwork: BMET ALREADY SCHEDULED   Testing/Procedures: NONE ORDERED TODAY  Follow-Up: Your physician wants you to follow-up in: [place], [facility]  You will receive a reminder letter in the mail two months in advance. If you don't receive a letter, please call our office to schedule the follow-up appointment.   Any Other Special Instructions Will Be Listed Below (If Applicable).     If you need a refill on your cardiac medications before your next appointment, please call your pharmacy.

## 2017-10-23 ENCOUNTER — Telehealth: Payer: Self-pay | Admitting: *Deleted

## 2017-10-23 NOTE — Telephone Encounter (Signed)
-----   Message from Liliane Shi, [place] sent at 10/22/2017  4:50 PM EDT ----- The kidney function (Creatinine) and potassium are normal. Continue current medications and follow up as planned.  Richardson Dopp, PA-C    10/22/2017 4:50 PM

## 2017-10-23 NOTE — Telephone Encounter (Signed)
Left message to go over lab results. Lab results have been forwarded to [facility] for pt's review.

## 2017-10-31 ENCOUNTER — Ambulatory Visit: Payer: Federal, State, Local not specified - PPO | Admitting: Family Medicine

## 2017-11-12 DIAGNOSIS — G4733 Obstructive sleep apnea (adult) (pediatric): Secondary | ICD-10-CM | POA: Diagnosis not present

## 2017-12-12 ENCOUNTER — Other Ambulatory Visit: Payer: Self-pay | Admitting: Family Medicine

## 2017-12-12 DIAGNOSIS — G4733 Obstructive sleep apnea (adult) (pediatric): Secondary | ICD-10-CM | POA: Diagnosis not present

## 2017-12-12 DIAGNOSIS — Z1231 Encounter for screening mammogram for malignant neoplasm of breast: Secondary | ICD-10-CM

## 2017-12-16 DIAGNOSIS — M79671 Pain in right foot: Secondary | ICD-10-CM | POA: Diagnosis not present

## 2017-12-16 DIAGNOSIS — M67471 Ganglion, right ankle and foot: Secondary | ICD-10-CM | POA: Diagnosis not present

## 2017-12-21 ENCOUNTER — Inpatient Hospital Stay (HOSPITAL_BASED_OUTPATIENT_CLINIC_OR_DEPARTMENT_OTHER): Admission: RE | Admit: 2017-12-21 | Payer: Federal, State, Local not specified - PPO | Source: Ambulatory Visit

## 2017-12-23 ENCOUNTER — Encounter: Payer: Self-pay | Admitting: Family Medicine

## 2017-12-25 DIAGNOSIS — F4322 Adjustment disorder with anxiety: Secondary | ICD-10-CM | POA: Diagnosis not present

## 2017-12-30 ENCOUNTER — Encounter (HOSPITAL_BASED_OUTPATIENT_CLINIC_OR_DEPARTMENT_OTHER): Payer: Self-pay

## 2017-12-30 ENCOUNTER — Ambulatory Visit (HOSPITAL_BASED_OUTPATIENT_CLINIC_OR_DEPARTMENT_OTHER)
Admission: RE | Admit: 2017-12-30 | Discharge: 2017-12-30 | Disposition: A | Discharge status: Home / Self Care | Payer: Federal, State, Local not specified - PPO | Source: Ambulatory Visit | Attending: Family Medicine | Admitting: Family Medicine

## 2017-12-30 DIAGNOSIS — Z1231 Encounter for screening mammogram for malignant neoplasm of breast: Secondary | ICD-10-CM

## 2018-01-23 ENCOUNTER — Other Ambulatory Visit: Payer: Self-pay | Admitting: Medical

## 2018-01-23 DIAGNOSIS — I10 Essential (primary) hypertension: Secondary | ICD-10-CM

## 2018-01-29 ENCOUNTER — Ambulatory Visit: Payer: Federal, State, Local not specified - PPO | Admitting: Family Medicine

## 2018-01-29 ENCOUNTER — Encounter: Payer: Self-pay | Admitting: Family Medicine

## 2018-01-29 DIAGNOSIS — H66002 Acute suppurative otitis media without spontaneous rupture of ear drum, left ear: Secondary | ICD-10-CM

## 2018-01-29 DIAGNOSIS — H6692 Otitis media, unspecified, left ear: Secondary | ICD-10-CM | POA: Insufficient documentation

## 2018-01-29 MED ORDER — AMOXICILLIN-POT CLAVULANATE 875-125 MG PO TABS: 1.0000 | ORAL_TABLET | Freq: Two times a day (BID) | ORAL | 0 refills | Status: DC

## 2018-01-29 MED ORDER — FLUCONAZOLE 150 MG PO TABS: 150.0000 mg | ORAL_TABLET | Freq: Once | ORAL | 0 refills | Status: AC

## 2018-01-29 NOTE — Progress Notes (Signed)
Julie Bishop - 47 y.o. female MRN [id number]  Date of birth: [birth year]  Subjective Chief Complaint  Patient presents with  . Ear Pain    HPI Julie Bishop is a 47 y.o. female here today with complaint of L sided ear pain.  Pain began a few days ago.  Pain was fairly quick in on set.  Has had some associated nasal congestion and had some sinus pressure earlier in the week.  Sinus pressure has resolved.  She denies fever, chills, chest pain, dizziness, ear drainage, headache or neck pain. She has not tried anything for treatment so far.   ROS:  A comprehensive ROS was completed and negative except as noted per HPI.   Allergies  Allergen Reactions  . Losartan Other (See Comments)    Diarrhea and abodminal cramping  . Oxycodone Itching    Past Medical History:  Diagnosis Date  . Adjustment disorder 01/25/2017  . Anemia   . Anxiety and depression   . Back pain   . Cancer ([place]) 2008   skin cancer, BCC  . Constipation 11/14/2014  . Depression   . Generalized anxiety disorder   . GERD (gastroesophageal reflux disease)   . Headache 08/24/2015  . History of cardiovascular stress test    GXT 3/19: No ischemic ECG changes  . History of echocardiogram    Echo 2/18:  EF 50-55, GLS -20% (normal), normal wall motion, grade 1 diastolic dysfunction, trivial MR, mild RVE, normal RV SF, trivial TR, PASP 23  . Hyperlipidemia    Elevated, but never taken medication  . Hypertension   . Insomnia 08/24/2015  . Obesity 11/14/2014  . OSA (obstructive sleep apnea) 09/12/2016  . Pain in joint, shoulder region 11/27/2015  . Palpitations   . Post-menopause 08/23/2015  . Raynauds syndrome   . Sleep apnea    appt with pulmonary Dr in march 2018  . Vitamin B12 deficiency 08/23/2015  . Vitamin D deficiency 08/23/2015    Past Surgical History:  Procedure Laterality Date  . ABDOMINAL HYSTERECTOMY  2010  . BREAST BIOPSY     right, 1996  . ECTOPIC PREGNANCY SURGERY    . SHOULDER OPEN  ROTATOR CUFF REPAIR  2017   right  . TUBAL LIGATION     on right  . TYMPANOSTOMY TUBE PLACEMENT      Social History   Socioeconomic History  . Marital status: Married    Spouse name: Lenee Franze  . Number of children: 1  . Years of education: 91  . Highest education level: Not on file  Occupational History  . 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[employer]  . Financial resource strain: Not on file  . Food insecurity:    Worry: Not on file    Inability: Not on file  . Transportation needs:    Medical: Not on file    Non-medical: Not on file  Tobacco Use  . Smoking status: Never Smoker  . Smokeless tobacco: Never Used  Substance and Sexual Activity  . Alcohol use: No    Alcohol/week: 0.0 standard drinks  . Drug use: No  . 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 Physical activity:    Days per week: Not on file    Minutes per session: Not on file  . Stress: Not on file  Relationships  .  Social connections:    Talks on phone: Not on file    Gets together: Not on file    Attends religious service: Not on file    Active member of club or organization: Not on file    Attends meetings of clubs or organizations: Not on file    Relationship status: Not on file  Other Topics Concern  . Not on file  Social History Narrative  . Not on file    Family History  Problem Relation Age of Onset  . Diabetes Mother   . Depression Mother   . Hypertension Mother   . Hyperlipidemia Mother   . Liver disease Mother   . Sleep apnea Mother   . Obesity Mother   . Heart attack Mother 2       CAD w/MI in mid 13s; s/p PCI  . Diabetes Father   . Hypertension Father   . Cancer Maternal Grandmother        throat  . Heart attack Maternal Grandmother   . Cancer Paternal Grandmother        lung  . Heart disease  Paternal Grandmother   . Hypertension Sister        as a teenager    Health Maintenance  Topic Date Due  . HIV Screening  04/07/1986  . INFLUENZA VACCINE  12/26/2017  . MAMMOGRAM  12/31/2018  . TETANUS/TDAP  11/14/2025    ----------------------------------------------------------------------------------------------------------------------------------------------------------------------------------------------------------------- Physical Exam BP 140/90   Pulse 75   Temp 98.7 F (37.1 C) (Oral)   Ht 5\' 4"  (1.626 m)   Wt 225 lb 12.8 oz (102.4 kg)   SpO2 99%   BMI 38.76 kg/m   Physical Exam  Constitutional: She appears well-nourished. No distress.  HENT:  Head: Normocephalic and atraumatic.  Mouth/Throat: Oropharynx is clear and moist.  L TM inflamed, erythematous R TM normal.   B/L EAC normal.  No sinus tenderness  Eyes: No scleral icterus.  Neck: Neck supple. No thyromegaly present.  Cardiovascular: Normal rate, regular rhythm and normal heart sounds.  Pulmonary/Chest: Effort normal and breath sounds normal.  Lymphadenopathy:    She has no cervical adenopathy.  Skin: Skin is warm and dry. No rash noted.  Psychiatric: She has a normal mood and affect. Her behavior is normal.    ------------------------------------------------------------------------------------------------------------------------------------------------------------------------------------------------------------------- Assessment and Plan  Otitis media Rx for augmentin May use tylenol/motrin prn for comfort.  Remain well hydrated  Typically gets yeast infection with antibiotic use, rx for diflucan sent in as well.

## 2018-01-29 NOTE — Assessment & Plan Note (Signed)
Rx for augmentin May use tylenol/motrin prn for comfort.  Remain well hydrated  Typically gets yeast infection with antibiotic use, rx for diflucan sent in as well.

## 2018-01-29 NOTE — Patient Instructions (Signed)

## 2018-02-16 DIAGNOSIS — N3001 Acute cystitis with hematuria: Secondary | ICD-10-CM | POA: Diagnosis not present

## 2018-02-16 DIAGNOSIS — R300 Dysuria: Secondary | ICD-10-CM | POA: Diagnosis not present

## 2018-03-30 ENCOUNTER — Other Ambulatory Visit: Payer: Self-pay | Admitting: Medical

## 2018-03-30 DIAGNOSIS — I10 Essential (primary) hypertension: Secondary | ICD-10-CM

## 2018-04-02 DIAGNOSIS — G4733 Obstructive sleep apnea (adult) (pediatric): Secondary | ICD-10-CM | POA: Diagnosis not present

## 2018-06-17 ENCOUNTER — Telehealth: Payer: Self-pay | Admitting: Cardiology

## 2018-06-17 DIAGNOSIS — I10 Essential (primary) hypertension: Secondary | ICD-10-CM

## 2018-06-17 MED ORDER — METOPROLOL SUCCINATE ER 25 MG PO TB24: 25.0000 mg | ORAL_TABLET | Freq: Every day | ORAL | 3 refills | Status: DC

## 2018-06-17 NOTE — Telephone Encounter (Signed)
Ms. Julie Bishop now states she has taken Toprol 50mg  daily. Per Julie Bishop, the patient will decrease Toprol to 25 mg daily. EKG/BP nurse visit scheduled 1/29. Ms. Julie Bishop was grateful for call and agrees with treatment plan.

## 2018-06-17 NOTE — Telephone Encounter (Signed)
Julie Bishop states she has lost around 45 lbs and thinks she may be on too much BP medication.  She has felt a little woozy and lightheaded several times over the past couple weeks. She has never fainted. She has no BP readings to report, but wears a FitBit and her HR is usually around 52. The lowest she has seen is 48 bpm.  Confirmed medications with patient - she is taking Toprol 50 mg BID and enalapril-hctz 5-12.5 mg qd. She has not had to take Lasix at all.   She understands she will be called with Scott's medication recommendations.

## 2018-06-17 NOTE — Telephone Encounter (Signed)
Decrease Toprol to 50 mg Once daily. Return for BP check and ECG with RN in 1-2 weeks. Richardson Dopp, PA-C    06/17/2018 5:10 PM

## 2018-06-17 NOTE — Telephone Encounter (Signed)
New Message   Pt c/o medication issue:  1. Name of Medication: Metoprolol, Enalapril   2. How are you currently taking this medication (dosage and times per day)? Both 1x daily in the morning   3. Are you having a reaction (difficulty breathing--STAT)? No   4. What is your medication issue? Pt says she lost 45.2 lbs and she thinks BP has gone lower. She feels off, lightheaded.  A slight headache yesterday She thinks she needs her medication adjusted  Please call

## 2018-06-25 ENCOUNTER — Ambulatory Visit: Payer: Federal, State, Local not specified - PPO

## 2018-07-02 DIAGNOSIS — G4733 Obstructive sleep apnea (adult) (pediatric): Secondary | ICD-10-CM | POA: Diagnosis not present

## 2018-07-14 ENCOUNTER — Other Ambulatory Visit: Payer: Self-pay | Admitting: Physician Assistant

## 2018-07-15 ENCOUNTER — Ambulatory Visit: Payer: Federal, State, Local not specified - PPO | Admitting: Family Medicine

## 2018-07-15 ENCOUNTER — Encounter: Payer: Self-pay | Admitting: Family Medicine

## 2018-07-15 ENCOUNTER — Telehealth: Payer: Self-pay | Admitting: Family Medicine

## 2018-07-15 VITALS — BP 120/82 | HR 79 | Temp 98.3°F | Resp 18 | Wt 189.0 lb

## 2018-07-15 DIAGNOSIS — F419 Anxiety disorder, unspecified: Secondary | ICD-10-CM | POA: Diagnosis not present

## 2018-07-15 DIAGNOSIS — I10 Essential (primary) hypertension: Secondary | ICD-10-CM | POA: Diagnosis not present

## 2018-07-15 DIAGNOSIS — R739 Hyperglycemia, unspecified: Secondary | ICD-10-CM | POA: Diagnosis not present

## 2018-07-15 DIAGNOSIS — M25552 Pain in left hip: Secondary | ICD-10-CM

## 2018-07-15 DIAGNOSIS — R32 Unspecified urinary incontinence: Secondary | ICD-10-CM | POA: Diagnosis not present

## 2018-07-15 DIAGNOSIS — E559 Vitamin D deficiency, unspecified: Secondary | ICD-10-CM | POA: Diagnosis not present

## 2018-07-15 DIAGNOSIS — E782 Mixed hyperlipidemia: Secondary | ICD-10-CM | POA: Diagnosis not present

## 2018-07-15 DIAGNOSIS — E538 Deficiency of other specified B group vitamins: Secondary | ICD-10-CM | POA: Diagnosis not present

## 2018-07-15 DIAGNOSIS — Z79899 Other long term (current) drug therapy: Secondary | ICD-10-CM | POA: Diagnosis not present

## 2018-07-15 DIAGNOSIS — F329 Major depressive disorder, single episode, unspecified: Secondary | ICD-10-CM

## 2018-07-15 DIAGNOSIS — F32A Depression, unspecified: Secondary | ICD-10-CM

## 2018-07-15 MED ORDER — DULOXETINE HCL 30 MG PO CPEP: 30.0000 mg | ORAL_CAPSULE | Freq: Three times a day (TID) | ORAL | 3 refills | Status: DC

## 2018-07-15 MED ORDER — TIZANIDINE HCL 4 MG PO TABS: 4.0000 mg | ORAL_TABLET | Freq: Two times a day (BID) | ORAL | 2 refills | Status: DC | PRN

## 2018-07-15 MED ORDER — LORAZEPAM 0.5 MG PO TABS: 0.5000 mg | ORAL_TABLET | Freq: Two times a day (BID) | ORAL | 3 refills | Status: DC | PRN

## 2018-07-15 MED ORDER — METOPROLOL SUCCINATE ER 25 MG PO TB24: 12.5000 mg | ORAL_TABLET | Freq: Every day | ORAL | 3 refills | Status: DC

## 2018-07-15 NOTE — Assessment & Plan Note (Signed)
Supplement and monitor 

## 2018-07-15 NOTE — Assessment & Plan Note (Signed)
Encouraged heart healthy diet, increase exercise, avoid trans fats, consider a krill oil cap daily 

## 2018-07-15 NOTE — Progress Notes (Signed)
Subjective:    Patient ID: Julie Bishop, female    DOB: November 25, 1970, 48 y.o.   MRN: [id number]  No chief complaint on file.   HPI Patient is in today for follow-up.  She is noting trouble with her left hip recently.  Hurts at the posterior hip and then radiates down her leg to her calf.  Does not necessarily worsen with movement but has been present for over a week.  No recent fall or injury.  No swelling warmth or redness anywhere in her leg.  She has had trouble in the past with similar pain but it was many years ago and resolved.  No recent febrile illness or hospitalizations.  She continues to deal with a good deal of stress but is otherwise doing well. Encouraged heart healthy diet, increase exercise, avoid trans fats, consider a krill oil cap daily  Past Medical History:  Diagnosis Date  . Adjustment disorder 01/25/2017  . Anemia   . Anxiety and depression   . Back pain   . Cancer ([place]) 2008   skin cancer, BCC  . Constipation 11/14/2014  . Depression   . Generalized anxiety disorder   . GERD (gastroesophageal reflux disease)   . Headache 08/24/2015  . History of cardiovascular stress test    GXT 3/19: No ischemic ECG changes  . History of echocardiogram    Echo 2/18:  EF 50-55, GLS -20% (normal), normal wall motion, grade 1 diastolic dysfunction, trivial MR, mild RVE, normal RV SF, trivial TR, PASP 23  . Hyperlipidemia    Elevated, but never taken medication  . Hypertension   . Insomnia 08/24/2015  . Obesity 11/14/2014  . OSA (obstructive sleep apnea) 09/12/2016  . Pain in joint, shoulder region 11/27/2015  . Palpitations   . Post-menopause 08/23/2015  . Raynauds syndrome   . Sleep apnea    appt with pulmonary Dr in march 2018  . Vitamin B12 deficiency 08/23/2015  . Vitamin D deficiency 08/23/2015    Past Surgical History:  Procedure Laterality Date  . ABDOMINAL HYSTERECTOMY  2010  . BREAST BIOPSY     right, 1996  . ECTOPIC PREGNANCY SURGERY    . SHOULDER OPEN  ROTATOR CUFF REPAIR  2017   right  . TUBAL LIGATION     on right  . TYMPANOSTOMY TUBE PLACEMENT      Family History  Problem Relation Age of Onset  . Diabetes Mother   . Depression Mother   . Hypertension Mother   . Hyperlipidemia Mother   . Liver disease Mother   . Sleep apnea Mother   . Obesity Mother   . Heart attack Mother 73       CAD w/MI in mid 54s; s/p PCI  . Diabetes Father   . Hypertension Father   . Cancer Maternal Grandmother        throat  . Heart attack Maternal Grandmother   . Cancer Paternal Grandmother        lung  . Heart disease Paternal Grandmother   . Hypertension Sister        as a teenager    Social History   Socioeconomic History  . Marital status: Married    Spouse name: Harbor Paster  . Number of children: 1  . Years of education: 6  . Highest education level: Not on file  Occupational History  . 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[employer]  . Financial resource strain: Not on file  . Food insecurity:  Worry: Not on file    Inability: Not on file  . Transportation needs:    Medical: Not on file    Non-medical: Not on file  Tobacco Use  . Smoking status: Never Smoker  . Smokeless tobacco: Never Used  Substance and Sexual Activity  . Alcohol use: No    Alcohol/week: 0.0 standard drinks  . Drug use: No  . 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 Physical activity:    Days per week: Not on file    Minutes per session: Not on file  . Stress: Not on file  Relationships  . Social connections:    Talks on phone: Not on file    Gets together: Not on file    Attends religious service: Not on file    Active member of club or organization: Not on file    Attends meetings of clubs or organizations: Not on file    Relationship status: Not on file  . Intimate partner  violence:    Fear of current or ex partner: Not on file    Emotionally abused: Not on file    Physically abused: Not on file    Forced sexual activity: Not on file  Other Topics Concern  . Not on file  Social History Narrative  . Not on file    Outpatient Medications Prior to Visit  Medication Sig Dispense Refill  . Enalapril-hydroCHLOROthiazide 5-12.5 MG tablet Take 1 tablet by mouth daily. 90 tablet 3  . amoxicillin-clavulanate (AUGMENTIN) 875-125 MG tablet Take 1 tablet by mouth 2 (two) times daily. 20 tablet 0  . DULoxetine (CYMBALTA) 30 MG capsule Take 30 mg by mouth 3 (three) times daily.     . furosemide (LASIX) 20 MG tablet Take 1 tablet (20 mg total) by mouth as needed for edema. 30 tablet 6  . metoprolol succinate (TOPROL-XL) 25 MG 24 hr tablet Take 1 tablet (25 mg total) by mouth daily. 90 tablet 3  . ranitidine (ZANTAC) 300 MG tablet Take 1 tablet (300 mg total) by mouth at bedtime as needed for heartburn. 30 tablet 3   No facility-administered medications prior to visit.     Allergies  Allergen Reactions  . Losartan Other (See Comments)    Diarrhea and abodminal cramping  . Oxycodone Itching    Review of Systems  Constitutional: Negative for fever and malaise/fatigue.  HENT: Negative for congestion.   Eyes: Negative for blurred vision.  Respiratory: Negative for shortness of breath.   Cardiovascular: Negative for chest pain, palpitations and leg swelling.  Gastrointestinal: Negative for abdominal pain, blood in stool and nausea.  Genitourinary: Negative for dysuria and frequency.  Musculoskeletal: Positive for joint pain. Negative for falls.  Skin: Negative for rash.  Neurological: Negative for dizziness, loss of consciousness and headaches.  Endo/Heme/Allergies: Negative for environmental allergies.  Psychiatric/Behavioral: Negative for depression. The patient is nervous/anxious.        Objective:    Physical Exam Vitals signs and nursing note reviewed.    Constitutional:      General: She is not in acute distress.    Appearance: She is well-developed.  HENT:     Head: Normocephalic and atraumatic.     Nose: Nose normal.  Eyes:     General:        Right eye: No discharge.  Left eye: No discharge.  Neck:     Musculoskeletal: Normal range of motion and neck supple.  Cardiovascular:     Rate and Rhythm: Normal rate and regular rhythm.     Heart sounds: No murmur.  Pulmonary:     Effort: Pulmonary effort is normal.     Breath sounds: Normal breath sounds.  Abdominal:     General: Bowel sounds are normal.     Palpations: Abdomen is soft.     Tenderness: There is no abdominal tenderness.  Musculoskeletal: Normal range of motion.        General: No tenderness or deformity.     Right lower leg: No edema.     Left lower leg: No edema.  Skin:    General: Skin is warm and dry.  Neurological:     Mental Status: She is alert and oriented to person, place, and time.     BP 120/82 (BP Location: Left Arm, Patient Position: Sitting, Cuff Size: Normal)   Pulse 79   Temp 98.3 F (36.8 C) (Oral)   Resp 18   Wt 189 lb (85.7 kg)   SpO2 98%   BMI 32.44 kg/m  Wt Readings from Last 3 Encounters:  07/15/18 189 lb (85.7 kg)  01/29/18 225 lb 12.8 oz (102.4 kg)  10/22/17 224 lb 12.8 oz (102 kg)     Lab Results  Component Value Date   WBC 8.2 07/15/2018   HGB 13.3 07/15/2018   HCT 38.7 07/15/2018   PLT 323.0 07/15/2018   GLUCOSE 71 07/15/2018   CHOL 246 (H) 07/15/2018   TRIG 128.0 07/15/2018   HDL 78.30 07/15/2018   LDLCALC 142 (H) 07/15/2018   ALT 15 07/15/2018   AST 14 07/15/2018   NA 139 07/15/2018   K 4.3 07/15/2018   CL 99 07/15/2018   CREATININE 0.71 07/15/2018   BUN 17 07/15/2018   CO2 31 07/15/2018   TSH 1.89 07/15/2018   HGBA1C 5.2 07/15/2018    Lab Results  Component Value Date   TSH 1.89 07/15/2018   Lab Results  Component Value Date   WBC 8.2 07/15/2018   HGB 13.3 07/15/2018   HCT 38.7 07/15/2018    MCV 86.1 07/15/2018   PLT 323.0 07/15/2018   Lab Results  Component Value Date   NA 139 07/15/2018   K 4.3 07/15/2018   CO2 31 07/15/2018   GLUCOSE 71 07/15/2018   BUN 17 07/15/2018   CREATININE 0.71 07/15/2018   BILITOT 0.5 07/15/2018   ALKPHOS 59 07/15/2018   AST 14 07/15/2018   ALT 15 07/15/2018   PROT 7.2 07/15/2018   ALBUMIN 4.4 07/15/2018   CALCIUM 9.7 07/15/2018   GFR 88.13 07/15/2018   Lab Results  Component Value Date   CHOL 246 (H) 07/15/2018   Lab Results  Component Value Date   HDL 78.30 07/15/2018   Lab Results  Component Value Date   LDLCALC 142 (H) 07/15/2018   Lab Results  Component Value Date   TRIG 128.0 07/15/2018   Lab Results  Component Value Date   CHOLHDL 3 07/15/2018   Lab Results  Component Value Date   HGBA1C 5.2 07/15/2018       Assessment & Plan:   Problem List Items Addressed This Visit    Anxiety and depression    Will be taking over her cymbalta and Lorazepam from [facility], was previously seeing Teres Hurst a PA over there. She was diagnosed with Adjustment Disorder and she is doing much  better now. She usually only needs one Lorazepam a month. UDS and contract are done today      Relevant Medications   LORazepam (ATIVAN) 0.5 MG tablet   DULoxetine (CYMBALTA) 30 MG capsule   Hyperlipidemia    Encouraged heart healthy diet, increase exercise, avoid trans fats, consider a krill oil cap daily      Relevant Medications   metoprolol succinate (TOPROL-XL) 25 MG 24 hr tablet   Other Relevant Orders   Lipid panel (Completed)   Hypertension    Well controlled, no changes to meds. Encouraged heart healthy diet such as the DASH diet and exercise as tolerated.       Relevant Medications   metoprolol succinate (TOPROL-XL) 25 MG 24 hr tablet   Other Relevant Orders   CBC (Completed)   Comprehensive metabolic panel (Completed)   TSH (Completed)   Vitamin D deficiency    Supplement and monitor      Relevant Orders    VITAMIN D 25 Hydroxy (Vit-D Deficiency, Fractures) (Completed)   Vitamin B12 deficiency    Supplement and monitor      Relevant Orders   Vitamin B12 (Completed)   Hyperglycemia    hgba1c acceptable, minimize simple carbs. Increase exercise as tolerated.       Relevant Orders   Hemoglobin A1c (Completed)   Hip pain, left    Likely sacroilietis. Start on TIzanidine qhs prn. Encouraged moist heat and gentle stretching as tolerated. May try NSAIDs and prescription meds as directed and report if symptoms worsen or seek immediate care       Other Visit Diagnoses    High risk medication use    -  Primary   Relevant Orders   Pain Mgmt, Profile 8 w/Conf, U   Urinary incontinence, unspecified type       Relevant Orders   Urinalysis (Completed)   Urine Culture      I have discontinued Navia L. Thull's ranitidine, furosemide, and amoxicillin-clavulanate. I have also changed her metoprolol succinate and DULoxetine. Additionally, I am having her start on LORazepam and tiZANidine. Lastly, I am having her maintain her Enalapril-hydroCHLOROthiazide.  Meds ordered this encounter  Medications  . LORazepam (ATIVAN) 0.5 MG tablet    Sig: Take 1 tablet (0.5 mg total) by mouth 2 (two) times daily as needed for anxiety.    Dispense:  30 tablet    Refill:  3  . tiZANidine (ZANAFLEX) 4 MG tablet    Sig: Take 1 tablet (4 mg total) by mouth 2 (two) times daily as needed for muscle spasms.    Dispense:  30 tablet    Refill:  2  . metoprolol succinate (TOPROL-XL) 25 MG 24 hr tablet    Sig: Take 0.5 tablets (12.5 mg total) by mouth daily.    Dispense:  90 tablet    Refill:  3  . DULoxetine (CYMBALTA) 30 MG capsule    Sig: Take 1 capsule (30 mg total) by mouth 3 (three) times daily.    Dispense:  90 capsule    Refill:  3     Penni Homans, MD

## 2018-07-15 NOTE — Telephone Encounter (Signed)
Copied from [place] [id number]. Topic: Quick Communication - See Telephone Encounter >> Jul 15, 2018  3:04 PM Vernona Rieger wrote: CRM for notification. See Telephone encounter for: 07/15/18.  [employer] needs clarification on directions for DULoxetine (CYMBALTA) 30 MG capsule. It says take one tablet by mouth three time daily. She said she thinks its suppose to say take three tablets by mouth one daily.

## 2018-07-15 NOTE — Assessment & Plan Note (Signed)
Well controlled, no changes to meds. Encouraged heart healthy diet such as the DASH diet and exercise as tolerated.  °

## 2018-07-15 NOTE — Assessment & Plan Note (Signed)
Will be taking over her cymbalta and Lorazepam from [facility], was previously seeing Teres Hurst a PA over there. She was diagnosed with Adjustment Disorder and she is doing much better now. She usually only needs one Lorazepam a month. UDS and contract are done today

## 2018-07-15 NOTE — Assessment & Plan Note (Signed)
Likely sacroilietis. Start on TIzanidine qhs prn. Encouraged moist heat and gentle stretching as tolerated. May try NSAIDs and prescription meds as directed and report if symptoms worsen or seek immediate care

## 2018-07-15 NOTE — Telephone Encounter (Signed)
will route to office for final disposition; pt last seen by Dr Charlett Blake 07/15/2018; spoke with Delana Meyer, and she will clarify with provider, and contact the pharmacy.

## 2018-07-15 NOTE — Patient Instructions (Signed)
Kegel Exercises  Kegel exercises help strengthen the muscles that support the rectum, vagina, small intestine, bladder, and uterus. Doing Kegel exercises can help:   Improve bladder and bowel control.   Improve sexual response.   Reduce problems and discomfort during pregnancy.  Kegel exercises involve squeezing your pelvic floor muscles, which are the same muscles you squeeze when you try to stop the flow of urine. The exercises can be done while sitting, standing, or lying down, but it is best to vary your position.  Exercises  1. Squeeze your pelvic floor muscles tight. You should feel a tight lift in your rectal area. If you are a female, you should also feel a tightness in your vaginal area. Keep your stomach, buttocks, and legs relaxed.  2. Hold the muscles tight for up to 10 seconds.  3. Relax your muscles.  Repeat this exercise 50 times a day or as many times as told by your health care provider. Continue to do this exercise for at least 4-6 weeks or for as long as told by your health care provider.  This information is not intended to replace advice given to you by your health care provider. Make sure you discuss any questions you have with your health care provider.  Document Released: 04/30/2012 Document Revised: 09/24/2016 Document Reviewed: 04/03/2015  Elsevier Interactive Patient Education  2019 [employer]vier Inc[employer]

## 2018-07-15 NOTE — Assessment & Plan Note (Addendum)
hgba1c acceptable, minimize simple carbs. Increase exercise as tolerated.  

## 2018-07-16 ENCOUNTER — Telehealth: Payer: Self-pay | Admitting: Family Medicine

## 2018-07-16 LAB — COMPREHENSIVE METABOLIC PANEL
ALT: 15 U/L (ref 0–35)
AST: 14 U/L (ref 0–37)
Albumin: 4.4 g/dL (ref 3.5–5.2)
Alkaline Phosphatase: 59 U/L (ref 39–117)
BUN: 17 mg/dL (ref 6–23)
CHLORIDE: 99 meq/L (ref 96–112)
CO2: 31 mEq/L (ref 19–32)
Calcium: 9.7 mg/dL (ref 8.4–10.5)
Creatinine, Ser: 0.71 mg/dL (ref 0.40–1.20)
GFR: 88.13 mL/min (ref >60.00)
Glucose, Bld: 71 mg/dL (ref 70–99)
Potassium: 4.3 mEq/L (ref 3.5–5.1)
Sodium: 139 mEq/L (ref 135–145)
Total Bilirubin: 0.5 mg/dL (ref 0.2–1.2)
Total Protein: 7.2 g/dL (ref 6.0–8.3)

## 2018-07-16 LAB — PAIN MGMT, PROFILE 8 W/CONF, U
6 Acetylmorphine: NEGATIVE ng/mL (ref <10)
Alcohol Metabolites: NEGATIVE ng/mL (ref <500)
Amphetamines: NEGATIVE ng/mL (ref <500)
Benzodiazepines: NEGATIVE ng/mL (ref <100)
Buprenorphine, Urine: NEGATIVE ng/mL (ref <5)
Cocaine Metabolite: NEGATIVE ng/mL (ref <150)
Creatinine: 16.4 mg/dL — ABNORMAL LOW
MDMA: NEGATIVE ng/mL (ref <500)
Marijuana Metabolite: NEGATIVE ng/mL (ref <20)
Opiates: NEGATIVE ng/mL (ref <100)
Oxidant: NEGATIVE ug/mL (ref <200)
Oxycodone: NEGATIVE ng/mL (ref <100)
Specific Gravity: 1.006 (ref >=1.0)
pH: 6.76 (ref 4.5–9.0)

## 2018-07-16 LAB — LIPID PANEL
Cholesterol: 246 mg/dL — ABNORMAL HIGH (ref 0–200)
HDL: 78.3 mg/dL (ref >39.00)
LDL Cholesterol: 142 mg/dL — ABNORMAL HIGH (ref 0–99)
NONHDL: 167.33
Total CHOL/HDL Ratio: 3
Triglycerides: 128 mg/dL (ref 0.0–149.0)
VLDL: 25.6 mg/dL (ref 0.0–40.0)

## 2018-07-16 LAB — URINALYSIS
Bilirubin Urine: NEGATIVE
Hgb urine dipstick: NEGATIVE
Ketones, ur: NEGATIVE
Leukocytes,Ua: NEGATIVE
Nitrite: NEGATIVE
Specific Gravity, Urine: <=1.005 — AB (ref 1.000–1.030)
Total Protein, Urine: NEGATIVE
URINE GLUCOSE: NEGATIVE
Urobilinogen, UA: 0.2 (ref 0.0–1.0)
pH: 7 (ref 5.0–8.0)

## 2018-07-16 LAB — CBC
HCT: 38.7 % (ref 36.0–46.0)
HEMOGLOBIN: 13.3 g/dL (ref 12.0–15.0)
MCHC: 34.3 g/dL (ref 30.0–36.0)
MCV: 86.1 fl (ref 78.0–100.0)
PLATELETS: 323 10*3/uL (ref 150.0–400.0)
RBC: 4.5 Mil/uL (ref 3.87–5.11)
RDW: 14.5 % (ref 11.5–15.5)
WBC: 8.2 10*3/uL (ref 4.0–10.5)

## 2018-07-16 LAB — HEMOGLOBIN A1C: Hgb A1c MFr Bld: 5.2 % (ref 4.6–6.5)

## 2018-07-16 LAB — TSH: TSH: 1.89 u[IU]/mL (ref 0.35–4.50)

## 2018-07-16 LAB — URINE CULTURE
MICRO NUMBER:: 209556
SPECIMEN QUALITY:: ADEQUATE

## 2018-07-16 LAB — VITAMIN D 25 HYDROXY (VIT D DEFICIENCY, FRACTURES): VITD: 20.36 ng/mL — ABNORMAL LOW (ref 30.00–100.00)

## 2018-07-16 LAB — VITAMIN B12: Vitamin B-12: 249 pg/mL (ref 211–911)

## 2018-07-16 NOTE — Telephone Encounter (Signed)
Please follow up with Devin the pharmacist   Kristopher Oppenheim at [place], [place] [phone] (Phone) [phone] (Fax)

## 2018-07-16 NOTE — Telephone Encounter (Signed)
[facility] calling to get clarification. Pt was previously taking the Cymbalta 3 tabs at the same time daily (from previous PA at [facility]). Pharmacist called to verify clarification of RX directions. Instructions in office note is to take 1 (30 mg) capsule three times per day. [phone]

## 2018-07-16 NOTE — Telephone Encounter (Signed)
Pharmacy is calling to get clarification of Rx directions. Please review for correction.

## 2018-07-16 NOTE — Telephone Encounter (Signed)
Cymbalta 30 mg tid is appropriate as we try and titrate her off of the medication

## 2018-07-16 NOTE — Telephone Encounter (Signed)
Please advise 

## 2018-07-16 NOTE — Telephone Encounter (Signed)
Copied from [place] [id number]. Topic: Quick Communication - Rx Refill/Question >> Jul 16, 2018  8:46 AM Burchel, Abbi R wrote: Medication: DULoxetine (CYMBALTA) 30 MG capsule   Kristopher Oppenheim at [place], [place] [phone] (Phone) [phone] (Fax)  Devin (Pharmacist) called to get rx clarification.  She would like to know if it was meant as (3) 1 x per day instead of (1) 3 x per day.

## 2018-07-17 NOTE — Telephone Encounter (Signed)
Left detailed voicemail for Julie Bishop about the medication

## 2018-08-09 DIAGNOSIS — J029 Acute pharyngitis, unspecified: Secondary | ICD-10-CM | POA: Diagnosis not present

## 2018-08-12 ENCOUNTER — Ambulatory Visit: Payer: BLUE CROSS/BLUE SHIELD | Admitting: Family Medicine

## 2018-08-12 ENCOUNTER — Other Ambulatory Visit: Payer: Self-pay

## 2018-08-12 ENCOUNTER — Encounter: Payer: Self-pay | Admitting: Family Medicine

## 2018-08-12 VITALS — BP 114/60 | HR 75 | Temp 98.0°F | Resp 18 | Ht 64.0 in | Wt 184.4 lb

## 2018-08-12 DIAGNOSIS — J029 Acute pharyngitis, unspecified: Secondary | ICD-10-CM | POA: Diagnosis not present

## 2018-08-12 DIAGNOSIS — H66002 Acute suppurative otitis media without spontaneous rupture of ear drum, left ear: Secondary | ICD-10-CM

## 2018-08-12 DIAGNOSIS — I10 Essential (primary) hypertension: Secondary | ICD-10-CM

## 2018-08-12 LAB — POCT INFLUENZA A/B
INFLUENZA A, POC: NEGATIVE
Influenza B, POC: NEGATIVE

## 2018-08-12 LAB — POCT RAPID STREP A (OFFICE): Rapid Strep A Screen: NEGATIVE

## 2018-08-12 MED ORDER — FLUCONAZOLE 150 MG PO TABS: 150.0000 mg | ORAL_TABLET | ORAL | 0 refills | Status: DC

## 2018-08-12 MED ORDER — CEFDINIR 300 MG PO CAPS: 300.0000 mg | ORAL_CAPSULE | Freq: Two times a day (BID) | ORAL | 0 refills | Status: AC

## 2018-08-12 NOTE — Patient Instructions (Signed)
Take antibiotic if ear pain worsens Call for referral for testing if fever occurs  Pharyngitis  Pharyngitis is redness, pain, and swelling (inflammation) of the throat (pharynx). It is a very common cause of sore throat. Pharyngitis can be caused by a bacteria, but it is usually caused by a virus. Most cases of pharyngitis get better on their own without treatment. What are the causes? This condition may be caused by:  Infection by viruses (viral). Viral pharyngitis spreads from person to person (is contagious) through coughing, sneezing, and sharing of personal items or utensils such as cups, forks, spoons, and toothbrushes.  Infection by bacteria (bacterial). Bacterial pharyngitis may be spread by touching the nose or face after coming in contact with the bacteria, or through more intimate contact, such as kissing.  Allergies. Allergies can cause buildup of mucus in the throat (post-nasal drip), leading to inflammation and irritation. Allergies can also cause blocked nasal passages, forcing breathing through the mouth, which dries and irritates the throat. What increases the risk? You are more likely to develop this condition if:  You are 18-110 years old.  You are exposed to crowded environments such as daycare, school, or dormitory living.  You live in a cold climate.  You have a weakened disease-fighting (immune) system. What are the signs or symptoms? Symptoms of this condition vary by the cause (viral, bacterial, or allergies) and can include:  Sore throat.  Fatigue.  Low-grade fever.  Headache.  Joint pain and muscle aches.  Skin rashes.  Swollen glands in the throat (lymph nodes).  Plaque-like film on the throat or tonsils. This is often a symptom of bacterial pharyngitis.  Vomiting.  Stuffy nose (nasal congestion).  Cough.  Red, itchy eyes (conjunctivitis).  Loss of appetite. How is this diagnosed? This condition is often diagnosed based on your medical  history and a physical exam. Your health care provider will ask you questions about your illness and your symptoms. A swab of your throat may be done to check for bacteria (rapid strep test). Other lab tests may also be done, depending on the suspected cause, but these are rare. How is this treated? This condition usually gets better in 3-4 days without medicine. Bacterial pharyngitis may be treated with antibiotic medicines. Follow these instructions at home:  Take over-the-counter and prescription medicines only as told by your health care provider. ? If you were prescribed an antibiotic medicine, take it as told by your health care provider. Do not stop taking the antibiotic even if you start to feel better. ? Do not give children aspirin because of the association with Reye syndrome.  Drink enough water and fluids to keep your urine clear or pale yellow.  Get a lot of rest.  Gargle with a salt-water mixture 3-4 times a day or as needed. To make a salt-water mixture, completely dissolve -1 tsp of salt in 1 cup of warm water.  If your health care provider approves, you may use throat lozenges or sprays to soothe your throat. Contact a health care provider if:  You have large, tender lumps in your neck.  You have a rash.  You cough up green, yellow-brown, or bloody spit. Get help right away if:  Your neck becomes stiff.  You drool or are unable to swallow liquids.  You cannot drink or take medicines without vomiting.  You have severe pain that does not go away, even after you take medicine.  You have trouble breathing, and it is not caused by  a stuffy nose.  You have new pain and swelling in your joints such as the knees, ankles, wrists, or elbows. Summary  Pharyngitis is redness, pain, and swelling (inflammation) of the throat (pharynx).  While pharyngitis can be caused by a bacteria, the most common causes are viral.  Most cases of pharyngitis get better on their own  without treatment.  Bacterial pharyngitis is treated with antibiotic medicines. This information is not intended to replace advice given to you by your health care provider. Make sure you discuss any questions you have with your health care provider. Document Released: 05/14/2005 Document Revised: 06/19/2016 Document Reviewed: 06/19/2016 Elsevier Interactive Patient Education  2019 [employer].

## 2018-08-13 ENCOUNTER — Telehealth: Payer: Self-pay

## 2018-08-13 DIAGNOSIS — J029 Acute pharyngitis, unspecified: Secondary | ICD-10-CM | POA: Insufficient documentation

## 2018-08-13 NOTE — Progress Notes (Signed)
Subjective:    Patient ID: Julie Bishop, female    DOB: 1971-05-10, 48 y.o.   MRN: [id number]  Chief Complaint  Patient presents with   Cough    Pt seen at urgent care and feels symptoms have gotten worse. Pt seen at [facility] in [place].    Follow-up    HPI Patient is in today for evaluation of cough and sore throat she was seen in Urgent care several days ago and tested negative for strep but she doe not feel like it was a good test. She continues to struggle with dry cough, sore throat, malaise, myalgias and head congestion with intermittent fever. Denies CP/palp/SOB/HA/GI or GU c/o. Taking meds as prescribed  Past Medical History:  Diagnosis Date   Adjustment disorder 01/25/2017   Anemia    Anxiety and depression    Back pain    Cancer ([place]) 2008   skin cancer, BCC   Constipation 11/14/2014   Depression    Generalized anxiety disorder    GERD (gastroesophageal reflux disease)    Headache 08/24/2015   History of cardiovascular stress test    GXT 3/19: No ischemic ECG changes   History of echocardiogram    Echo 2/18:  EF 50-55, GLS -20% (normal), normal wall motion, grade 1 diastolic dysfunction, trivial MR, mild RVE, normal RV SF, trivial TR, PASP 23   Hyperlipidemia    Elevated, but never taken medication   Hypertension    Insomnia 08/24/2015   Obesity 11/14/2014   OSA (obstructive sleep apnea) 09/12/2016   Pain in joint, shoulder region 11/27/2015   Palpitations    Post-menopause 08/23/2015   Raynauds syndrome    Sleep apnea    appt with pulmonary Dr in march 2018   Vitamin B12 deficiency 08/23/2015   Vitamin D deficiency 08/23/2015    Past Surgical History:  Procedure Laterality Date   ABDOMINAL HYSTERECTOMY  2010   BREAST BIOPSY     right, 1996   ECTOPIC PREGNANCY SURGERY     SHOULDER OPEN ROTATOR CUFF REPAIR  2017   right   TUBAL LIGATION     on right   TYMPANOSTOMY TUBE PLACEMENT      Family History  Problem  Relation Age of Onset   Diabetes Mother    Depression Mother    Hypertension Mother    Hyperlipidemia Mother    Liver disease Mother    Sleep apnea Mother    Obesity Mother    Heart attack Mother 62       CAD w/MI in mid 34s; s/p PCI   Diabetes Father    Hypertension Father    Cancer Maternal Grandmother        throat   Heart attack Maternal Grandmother    Cancer Paternal Grandmother        lung   Heart disease Paternal Grandmother    Hypertension Sister        as a teenager    Social History   Socioeconomic History   Marital status: Married    Spouse name: Oddie Kuhlmann   Number of children: 1   Years of education: 12   Highest education level: Not on file  Occupational History   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[employer]  Social Designer, fashion/clothing strain: Not on file   Food insecurity:    Worry: Not on file    Inability: Not on file   Transportation needs:    Medical: Not on file  Non-medical: Not on file  Tobacco Use   Smoking status: Never Smoker   Smokeless tobacco: Never Used  Substance and Sexual Activity   Alcohol use: No    Alcohol/week: 0.0 standard drinks   Drug use: No   Sexual activity: Yes    Partners: Male    Birth control/protection: Post-menopausal    Comment: 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[place], minimize carbs., lives with husband  Lifestyle   Physical activity:    Days per week: Not on file    Minutes per session: Not on file   Stress: Not on file  Relationships   Social connections:    Talks on phone: Not on file    Gets together: Not on file    Attends religious service: Not on file    Active member of club or organization: Not on file    Attends meetings of clubs or organizations: Not on file    Relationship status: Not on file   Intimate partner violence:    Fear of current or ex partner: Not on file    Emotionally abused: Not on file    Physically abused: Not on file     Forced sexual activity: Not on file  Other Topics Concern   Not on file  Social History Narrative   Not on file    Outpatient Medications Prior to Visit  Medication Sig Dispense Refill   DULoxetine (CYMBALTA) 30 MG capsule Take 1 capsule (30 mg total) by mouth 3 (three) times daily. 90 capsule 3   Enalapril-hydroCHLOROthiazide 5-12.5 MG tablet Take 1 tablet by mouth daily. 90 tablet 3   LORazepam (ATIVAN) 0.5 MG tablet Take 1 tablet (0.5 mg total) by mouth 2 (two) times daily as needed for anxiety. 30 tablet 3   metoprolol succinate (TOPROL-XL) 25 MG 24 hr tablet Take 0.5 tablets (12.5 mg total) by mouth daily. 90 tablet 3   benzonatate (TESSALON) 200 MG capsule      tiZANidine (ZANAFLEX) 4 MG tablet Take 1 tablet (4 mg total) by mouth 2 (two) times daily as needed for muscle spasms. (Patient not taking: Reported on 08/12/2018) 30 tablet 2   No facility-administered medications prior to visit.     Allergies  Allergen Reactions   Losartan Other (See Comments)    Diarrhea and abodminal cramping   Oxycodone Itching    Review of Systems  Constitutional: Positive for fever and malaise/fatigue.  HENT: Positive for congestion and sore throat.   Eyes: Negative for blurred vision.  Respiratory: Positive for cough. Negative for shortness of breath.   Cardiovascular: Negative for chest pain, palpitations and leg swelling.  Gastrointestinal: Negative for abdominal pain, blood in stool and nausea.  Genitourinary: Negative for dysuria and frequency.  Musculoskeletal: Positive for myalgias. Negative for falls.  Skin: Negative for rash.  Neurological: Negative for dizziness, loss of consciousness and headaches.  Endo/Heme/Allergies: Negative for environmental allergies.  Psychiatric/Behavioral: Negative for depression. The patient is not nervous/anxious.        Objective:    Physical Exam Vitals signs and nursing note reviewed.  Constitutional:      General: She is not in  acute distress.    Appearance: She is well-developed.  HENT:     Head: Normocephalic and atraumatic.     Nose: Nose normal.  Eyes:     General:        Right eye: No discharge.        Left eye: No discharge.  Neck:  Musculoskeletal: Normal range of motion and neck supple.  Cardiovascular:     Rate and Rhythm: Normal rate and regular rhythm.     Heart sounds: No murmur.  Pulmonary:     Effort: Pulmonary effort is normal.     Breath sounds: Normal breath sounds.     Comments: Decreased breath sounds bilateral bases Abdominal:     General: Bowel sounds are normal.     Palpations: Abdomen is soft.     Tenderness: There is no abdominal tenderness.  Skin:    General: Skin is warm and dry.  Neurological:     Mental Status: She is alert and oriented to person, place, and time.     BP 114/60 (BP Location: Left Arm, Patient Position: Sitting, Cuff Size: Normal)    Pulse 75    Temp 98 F (36.7 C) (Oral)    Resp 18    Ht 5\' 4"  (1.626 m)    Wt 184 lb 6.4 oz (83.6 kg)    SpO2 99%    BMI 31.65 kg/m  Wt Readings from Last 3 Encounters:  08/12/18 184 lb 6.4 oz (83.6 kg)  07/15/18 189 lb (85.7 kg)  01/29/18 225 lb 12.8 oz (102.4 kg)     Lab Results  Component Value Date   WBC 8.2 07/15/2018   HGB 13.3 07/15/2018   HCT 38.7 07/15/2018   PLT 323.0 07/15/2018   GLUCOSE 71 07/15/2018   CHOL 246 (H) 07/15/2018   TRIG 128.0 07/15/2018   HDL 78.30 07/15/2018   LDLCALC 142 (H) 07/15/2018   ALT 15 07/15/2018   AST 14 07/15/2018   NA 139 07/15/2018   K 4.3 07/15/2018   CL 99 07/15/2018   CREATININE 0.71 07/15/2018   BUN 17 07/15/2018   CO2 31 07/15/2018   TSH 1.89 07/15/2018   HGBA1C 5.2 07/15/2018    Lab Results  Component Value Date   TSH 1.89 07/15/2018   Lab Results  Component Value Date   WBC 8.2 07/15/2018   HGB 13.3 07/15/2018   HCT 38.7 07/15/2018   MCV 86.1 07/15/2018   PLT 323.0 07/15/2018   Lab Results  Component Value Date   NA 139 07/15/2018   K 4.3  07/15/2018   CO2 31 07/15/2018   GLUCOSE 71 07/15/2018   BUN 17 07/15/2018   CREATININE 0.71 07/15/2018   BILITOT 0.5 07/15/2018   ALKPHOS 59 07/15/2018   AST 14 07/15/2018   ALT 15 07/15/2018   PROT 7.2 07/15/2018   ALBUMIN 4.4 07/15/2018   CALCIUM 9.7 07/15/2018   GFR 88.13 07/15/2018   Lab Results  Component Value Date   CHOL 246 (H) 07/15/2018   Lab Results  Component Value Date   HDL 78.30 07/15/2018   Lab Results  Component Value Date   LDLCALC 142 (H) 07/15/2018   Lab Results  Component Value Date   TRIG 128.0 07/15/2018   Lab Results  Component Value Date   CHOLHDL 3 07/15/2018   Lab Results  Component Value Date   HGBA1C 5.2 07/15/2018       Assessment & Plan:   Problem List Items Addressed This Visit    Hypertension    Well controlled, no changes to meds. Encouraged heart healthy diet such as the DASH diet and exercise as tolerated.       Otitis media of left ear    Is given a prescription for Cefdinir if symptoms worsen      Relevant Medications   cefdinir (OMNICEF)  300 MG capsule   fluconazole (DIFLUCAN) 150 MG tablet   Pharyngitis - Primary    Strep negative and flu negative. If worsens she should let us know.       Relevant Orders   POCT rapid strep A (Completed)   POCT Influenza A/B (Completed)   Culture, Group A Strep      I am having Julie Bishop start on cefdinir and fluconazole. I am also having her maintain her Enalapril-hydroCHLOROthiazide, LORazepam, tiZANidine, metoprolol succinate, DULoxetine, and benzonatate.  Meds ordered this encounter  Medications   cefdinir (OMNICEF) 300 MG capsule    Sig: Take 1 capsule (300 mg total) by mouth 2 (two) times daily for 10 days.    Dispense:  20 capsule    Refill:  0   fluconazole (DIFLUCAN) 150 MG tablet    Sig: Take 1 tablet (150 mg total) by mouth once a week. Prn yeast    Dispense:  2 tablet    Refill:  0     Penni Homans, MD

## 2018-08-13 NOTE — Telephone Encounter (Signed)
Spoke w/ Pt- she running low grade fever but doing okay. Informed to treat symptoms and let us know if not improving. Pt verbalized understanding.

## 2018-08-13 NOTE — Assessment & Plan Note (Signed)
Is given a prescription for Cefdinir if symptoms worsen

## 2018-08-13 NOTE — Assessment & Plan Note (Signed)
Well controlled, no changes to meds. Encouraged heart healthy diet such as the DASH diet and exercise as tolerated.  °

## 2018-08-13 NOTE — Telephone Encounter (Signed)
Copied from [place] [id number]. Topic: General - Other >> Aug 13, 2018  8:14 AM Carolyn Stare wrote:  Pt call to say Dr Charlett Blake told her to call this morning if she had a fever over night and she call to say she has a 100.5

## 2018-08-13 NOTE — Assessment & Plan Note (Signed)
Strep negative and flu negative. If worsens she should let us know.

## 2018-08-13 NOTE — Telephone Encounter (Signed)
Please let her know that they have tightened up we can test for today so we do not run out of tests so since she has not traveled or had contact with a known positive Covid we cannot test her today. Maybe send her a mychart note with the dot phrase that explains high risk cases. thanks

## 2018-08-14 LAB — CULTURE, GROUP A STREP
MICRO NUMBER:: 328309
SPECIMEN QUALITY:: ADEQUATE

## 2018-08-14 NOTE — Telephone Encounter (Addendum)
Pt is calling and she does not fit the criteria to be tested for covid. Pt would like a note to be able to return to work 08-18-2018. Pt still has a cough no fever. Please advise. Please put note in her mychart account

## 2018-09-30 DIAGNOSIS — G4733 Obstructive sleep apnea (adult) (pediatric): Secondary | ICD-10-CM | POA: Diagnosis not present

## 2018-10-14 ENCOUNTER — Other Ambulatory Visit: Payer: Self-pay

## 2018-10-14 ENCOUNTER — Encounter: Payer: Self-pay | Admitting: Family Medicine

## 2018-10-14 ENCOUNTER — Ambulatory Visit (INDEPENDENT_AMBULATORY_CARE_PROVIDER_SITE_OTHER): Payer: Federal, State, Local not specified - PPO | Admitting: Family Medicine

## 2018-10-14 DIAGNOSIS — E538 Deficiency of other specified B group vitamins: Secondary | ICD-10-CM

## 2018-10-14 DIAGNOSIS — Z6839 Body mass index (BMI) 39.0-39.9, adult: Secondary | ICD-10-CM

## 2018-10-14 DIAGNOSIS — E6609 Other obesity due to excess calories: Secondary | ICD-10-CM

## 2018-10-14 DIAGNOSIS — E559 Vitamin D deficiency, unspecified: Secondary | ICD-10-CM

## 2018-10-14 DIAGNOSIS — I10 Essential (primary) hypertension: Secondary | ICD-10-CM | POA: Diagnosis not present

## 2018-10-14 DIAGNOSIS — R739 Hyperglycemia, unspecified: Secondary | ICD-10-CM

## 2018-10-14 DIAGNOSIS — E782 Mixed hyperlipidemia: Secondary | ICD-10-CM

## 2018-10-14 NOTE — Assessment & Plan Note (Signed)
Supplement and monitor 

## 2018-10-14 NOTE — Assessment & Plan Note (Signed)
hgba1c acceptable, minimize simple carbs. Increase exercise as tolerated.  

## 2018-10-14 NOTE — Progress Notes (Signed)
Virtual Visit via Video Note  I connected with Julie Bishop on 10/14/18 at  9:00 AM EDT by a video enabled telemedicine application and verified that I am speaking with the correct person using two identifiers. Julie Bishop, CMA was able to get patient set up on video platform.   Location: Patient: home Provider: home   I discussed the limitations of evaluation and management by telemedicine and the availability of in person appointments. The patient expressed understanding and agreed to proceed. Julie Bishop, CMA.    Subjective:    Patient ID: Julie Bishop, female    DOB: 1970/06/21, 48 y.o.   MRN: [id number]  Chief Complaint  Patient presents with  . Hypertension  . Follow-up    HPI Patient is in today for follow up on chronic medical concerns including anxiety, pharyngitis, hyperglycemia, hypertension an more. She feels well today. No recent febrile illness or hospitalizations. No polyuria or polydipsia. Her biggest frustration is the weight gain after her weight loss. She had lost 50# but with lockdown she has gained back 25# due to immobility and snacking. Denies CP/palp/SOB/HA/congestion/fevers/GI or GU c/o. Taking meds as prescribed  Past Medical History:  Diagnosis Date  . Adjustment disorder 01/25/2017  . Anemia   . Anxiety and depression   . Back pain   . Cancer ([place]) 2008   skin cancer, BCC  . Constipation 11/14/2014  . Depression   . Generalized anxiety disorder   . GERD (gastroesophageal reflux disease)   . Headache 08/24/2015  . History of cardiovascular stress test    GXT 3/19: No ischemic ECG changes  . History of echocardiogram    Echo 2/18:  EF 50-55, GLS -20% (normal), normal wall motion, grade 1 diastolic dysfunction, trivial MR, mild RVE, normal RV SF, trivial TR, PASP 23  . Hyperlipidemia    Elevated, but never taken medication  . Hypertension   . Insomnia 08/24/2015  . Obesity 11/14/2014  . OSA (obstructive sleep apnea)  09/12/2016  . Pain in joint, shoulder region 11/27/2015  . Palpitations   . Post-menopause 08/23/2015  . Raynauds syndrome   . Sleep apnea    appt with pulmonary Dr in march 2018  . Vitamin B12 deficiency 08/23/2015  . Vitamin D deficiency 08/23/2015    Past Surgical History:  Procedure Laterality Date  . ABDOMINAL HYSTERECTOMY  2010  . BREAST BIOPSY     right, 1996  . ECTOPIC PREGNANCY SURGERY    . SHOULDER OPEN ROTATOR CUFF REPAIR  2017   right  . TUBAL LIGATION     on right  . TYMPANOSTOMY TUBE PLACEMENT      Family History  Problem Relation Age of Onset  . Diabetes Mother   . Depression Mother   . Hypertension Mother   . Hyperlipidemia Mother   . Liver disease Mother   . Sleep apnea Mother   . Obesity Mother   . Heart attack Mother 62       CAD w/MI in mid 69s; s/p PCI  . Diabetes Father   . Hypertension Father   . Cancer Maternal Grandmother        throat  . Heart attack Maternal Grandmother   . Cancer Paternal Grandmother        lung  . Heart disease Paternal Grandmother   . Hypertension Sister        as a teenager    Social History   Socioeconomic History  . Marital status: Married    Spouse name:  Britt Boozer  . Number of children: 1  . Years of education: 49  . Highest education level: Not on file  Occupational History  . 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 Financial resource strain: Not on file  . Food insecurity:    Worry: Not on file    Inability: Not on file  . Transportation needs:    Medical: Not on file    Non-medical: Not on file  Tobacco Use  . Smoking status: Never Smoker  . Smokeless tobacco: Never Used  Substance and Sexual Activity  . Alcohol use: No    Alcohol/week: 0.0 standard drinks  . Drug use: No  . Sexual activity: Yes    Partners: Male    Birth control/protection: Post-menopausal    Comment: 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[place], [employer] lives with husband  Lifestyle  .  Physical activity:    Days per week: Not on file    Minutes per session: Not on file  . Stress: Not on file  Relationships  . Social connections:    Talks on phone: Not on file    Gets together: Not on file    Attends religious service: Not on file    Active member of club or organization: Not on file    Attends meetings of clubs or organizations: Not on file    Relationship status: Not on file  . Intimate partner violence:    Fear of current or ex partner: Not on file    Emotionally abused: Not on file    Physically abused: Not on file    Forced sexual activity: Not on file  Other Topics Concern  . Not on file  Social History Narrative  . Not on file    Outpatient Medications Prior to Visit  Medication Sig Dispense Refill  . DULoxetine (CYMBALTA) 30 MG capsule Take 1 capsule (30 mg total) by mouth 3 (three) times daily. 90 capsule 3  . Enalapril-hydroCHLOROthiazide 5-12.5 MG tablet Take 1 tablet by mouth daily. 90 tablet 3  . fluconazole (DIFLUCAN) 150 MG tablet Take 1 tablet (150 mg total) by mouth once a week. Prn yeast 2 tablet 0  . LORazepam (ATIVAN) 0.5 MG tablet Take 1 tablet (0.5 mg total) by mouth 2 (two) times daily as needed for anxiety. 30 tablet 3  . metoprolol succinate (TOPROL-XL) 25 MG 24 hr tablet Take 0.5 tablets (12.5 mg total) by mouth daily. 90 tablet 3  . tiZANidine (ZANAFLEX) 4 MG tablet Take 1 tablet (4 mg total) by mouth 2 (two) times daily as needed for muscle spasms. (Patient not taking: Reported on 10/14/2018) 30 tablet 2  . benzonatate (TESSALON) 200 MG capsule      No facility-administered medications prior to visit.     Allergies  Allergen Reactions  . Losartan Other (See Comments)    Diarrhea and abodminal cramping  . Oxycodone Itching    Review of Systems  Constitutional: Negative for fever and malaise/fatigue.  HENT: Negative for congestion.   Eyes: Negative for blurred vision.  Respiratory: Negative for shortness of breath.    Cardiovascular: Negative for chest pain, palpitations and leg swelling.  Gastrointestinal: Negative for abdominal pain, blood in stool and nausea.  Genitourinary: Negative for dysuria and frequency.  Musculoskeletal: Negative for falls.  Skin: Negative for rash.  Neurological: Negative for dizziness, loss of consciousness and headaches.  Endo/Heme/Allergies: Negative for environmental allergies.  Psychiatric/Behavioral: Negative for depression. The patient is not nervous/anxious.  Objective:    Physical Exam Constitutional:      Appearance: Normal appearance. She is not ill-appearing.  HENT:     Head: Normocephalic and atraumatic.     Nose: Nose normal.  Eyes:     General:        Right eye: No discharge.        Left eye: No discharge.  Pulmonary:     Effort: Pulmonary effort is normal.  Neurological:     Mental Status: She is alert and oriented to person, place, and time.  Psychiatric:        Mood and Affect: Mood normal.        Behavior: Behavior normal.     Ht 5\' 4"  (1.626 m)   BMI 31.65 kg/m  Wt Readings from Last 3 Encounters:  08/12/18 184 lb 6.4 oz (83.6 kg)  07/15/18 189 lb (85.7 kg)  01/29/18 225 lb 12.8 oz (102.4 kg)    Diabetic Foot Exam - Simple   No data filed     Lab Results  Component Value Date   WBC 8.2 07/15/2018   HGB 13.3 07/15/2018   HCT 38.7 07/15/2018   PLT 323.0 07/15/2018   GLUCOSE 71 07/15/2018   CHOL 246 (H) 07/15/2018   TRIG 128.0 07/15/2018   HDL 78.30 07/15/2018   LDLCALC 142 (H) 07/15/2018   ALT 15 07/15/2018   AST 14 07/15/2018   NA 139 07/15/2018   K 4.3 07/15/2018   CL 99 07/15/2018   CREATININE 0.71 07/15/2018   BUN 17 07/15/2018   CO2 31 07/15/2018   TSH 1.89 07/15/2018   HGBA1C 5.2 07/15/2018    Lab Results  Component Value Date   TSH 1.89 07/15/2018   Lab Results  Component Value Date   WBC 8.2 07/15/2018   HGB 13.3 07/15/2018   HCT 38.7 07/15/2018   MCV 86.1 07/15/2018   PLT 323.0 07/15/2018    Lab Results  Component Value Date   NA 139 07/15/2018   K 4.3 07/15/2018   CO2 31 07/15/2018   GLUCOSE 71 07/15/2018   BUN 17 07/15/2018   CREATININE 0.71 07/15/2018   BILITOT 0.5 07/15/2018   ALKPHOS 59 07/15/2018   AST 14 07/15/2018   ALT 15 07/15/2018   PROT 7.2 07/15/2018   ALBUMIN 4.4 07/15/2018   CALCIUM 9.7 07/15/2018   GFR 88.13 07/15/2018   Lab Results  Component Value Date   CHOL 246 (H) 07/15/2018   Lab Results  Component Value Date   HDL 78.30 07/15/2018   Lab Results  Component Value Date   LDLCALC 142 (H) 07/15/2018   Lab Results  Component Value Date   TRIG 128.0 07/15/2018   Lab Results  Component Value Date   CHOLHDL 3 07/15/2018   Lab Results  Component Value Date   HGBA1C 5.2 07/15/2018       Assessment & Plan:   Problem List Items Addressed This Visit    Hyperlipidemia    Encouraged heart healthy diet, increase exercise, avoid trans fats, consider a krill oil cap daily      Hypertension    Encouraged to get an Omron BP cuff for home and monitor weekly and prn, no changes to meds. Encouraged heart healthy diet such as the DASH diet and exercise as tolerated. Notify us if any concerns. Is taking a full tab of Metoprolol again with weight gain      Obesity    Is frustrated after loosing 50 # to have gained  back 25. Encouraged DASH diet or NOOM or weight watchers App, decrease po intake and increase exercise as tolerated. Needs 7-8 hours of sleep nightly. Avoid trans fats, eat small, frequent meals every 4-5 hours with lean proteins, complex carbs and healthy fats. Minimize simple carbs      Vitamin D deficiency    Taking 3000 IU daily recheck level with  Next visit and continue the same      Vitamin B12 deficiency    Supplement and monitor      Hyperglycemia    hgba1c acceptable, minimize simple carbs. Increase exercise as tolerated         I have discontinued Donyetta L. Loder's benzonatate. I am also having her maintain  her Enalapril-hydroCHLOROthiazide, LORazepam, tiZANidine, metoprolol succinate, DULoxetine, and fluconazole.  No orders of the defined types were placed in this encounter.  I discussed the assessment and treatment plan with the patient. The patient was provided an opportunity to ask questions and all were answered. The patient agreed with the plan and demonstrated an understanding of the instructions.   The patient was advised to call back or seek an in-person evaluation if the symptoms worsen or if the condition fails to improve as anticipated.  I provided 25 minutes of non-face-to-face time during this encounter.   Penni Homans, MD '

## 2018-10-14 NOTE — Assessment & Plan Note (Addendum)
Encouraged to get an Omron BP cuff for home and monitor weekly and prn, no changes to meds. Encouraged heart healthy diet such as the DASH diet and exercise as tolerated. Notify us if any concerns. Is taking a full tab of Metoprolol again with weight gain

## 2018-10-14 NOTE — Assessment & Plan Note (Signed)
Is frustrated after loosing 50 # to have gained back 25. Encouraged DASH diet or NOOM or weight watchers App, decrease po intake and increase exercise as tolerated. Needs 7-8 hours of sleep nightly. Avoid trans fats, eat small, frequent meals every 4-5 hours with lean proteins, complex carbs and healthy fats. Minimize simple carbs

## 2018-10-14 NOTE — Assessment & Plan Note (Signed)
Taking 3000 IU daily recheck level with  Next visit and continue the same

## 2018-10-14 NOTE — Assessment & Plan Note (Signed)
Encouraged heart healthy diet, increase exercise, avoid trans fats, consider a krill oil cap daily 

## 2018-10-28 ENCOUNTER — Ambulatory Visit (INDEPENDENT_AMBULATORY_CARE_PROVIDER_SITE_OTHER): Payer: Federal, State, Local not specified - PPO | Admitting: Family Medicine

## 2018-10-28 ENCOUNTER — Encounter: Payer: Self-pay | Admitting: Family Medicine

## 2018-10-28 ENCOUNTER — Other Ambulatory Visit: Payer: Self-pay

## 2018-10-28 DIAGNOSIS — H9202 Otalgia, left ear: Secondary | ICD-10-CM

## 2018-10-28 MED ORDER — PREDNISONE 20 MG PO TABS: 40.0000 mg | ORAL_TABLET | Freq: Every day | ORAL | 0 refills | Status: AC

## 2018-10-28 MED ORDER — AMOXICILLIN 875 MG PO TABS: 875.0000 mg | ORAL_TABLET | Freq: Two times a day (BID) | ORAL | 0 refills | Status: DC

## 2018-10-28 MED ORDER — FLUCONAZOLE 150 MG PO TABS: 150.0000 mg | ORAL_TABLET | ORAL | 0 refills | Status: DC

## 2018-10-28 NOTE — Progress Notes (Signed)
Chief Complaint  Patient presents with  . Ear Pain    [name] here for URI complaints. Due to COVID-19 pandemic, we are interacting via web portal for an electronic face-to-face visit. I verified patient's ID using 2 identifiers. Patient agreed to proceed with visit via this method. Patient is at home, I am at home. Patient and I are present for visit.   Duration: 1 day  Associated symptoms: sinus pain, ear pain and sore throat Denies: sinus congestion, cough, rhinorrhea, itchy watery eyes, ear drainage, wheezing, shortness of breath, myalgia and fevers Treatment to date: none Sick contacts: No  ROS:  Const: Denies fevers HEENT: As noted in HPI Lungs: No SOB  Past Medical History:  Diagnosis Date  . Adjustment disorder 01/25/2017  . Anemia   . Anxiety and depression   . Back pain   . Cancer ([place]) 2008   skin cancer, BCC  . Constipation 11/14/2014  . Depression   . Generalized anxiety disorder   . GERD (gastroesophageal reflux disease)   . Headache 08/24/2015  . History of cardiovascular stress test    GXT 3/19: No ischemic ECG changes  . History of echocardiogram    Echo 2/18:  EF 50-55, GLS -20% (normal), normal wall motion, grade 1 diastolic dysfunction, trivial MR, mild RVE, normal RV SF, trivial TR, PASP 23  . Hyperlipidemia    Elevated, but never taken medication  . Hypertension   . Insomnia 08/24/2015  . Obesity 11/14/2014  . OSA (obstructive sleep apnea) 09/12/2016  . Pain in joint, shoulder region 11/27/2015  . Palpitations   . Post-menopause 08/23/2015  . Raynauds syndrome   . Sleep apnea    appt with pulmonary Dr in march 2018  . Vitamin B12 deficiency 08/23/2015  . Vitamin D deficiency 08/23/2015   Exam No conversational dyspnea Age appropriate judgment and insight Nml affect and mood  Left ear pain - Plan: predniSONE (DELTASONE) 20 MG tablet, amoxicillin (AMOXIL) 875 MG tablet, fluconazole (DIFLUCAN) 150 MG tablet  Cover for ETD first. If no  improvement in next couple days, use abx. Diflucan prn as she gets yeast infections with abx.  F/u in person if neither of above helps.  Pt voiced understanding and agreement to the plan.  [place], DO 10/28/18 10:22 AM

## 2018-11-06 ENCOUNTER — Other Ambulatory Visit: Payer: Self-pay | Admitting: Physician Assistant

## 2018-12-03 ENCOUNTER — Other Ambulatory Visit (HOSPITAL_BASED_OUTPATIENT_CLINIC_OR_DEPARTMENT_OTHER): Payer: Self-pay | Admitting: Family Medicine

## 2018-12-03 DIAGNOSIS — Z1231 Encounter for screening mammogram for malignant neoplasm of breast: Secondary | ICD-10-CM

## 2018-12-30 DIAGNOSIS — G4733 Obstructive sleep apnea (adult) (pediatric): Secondary | ICD-10-CM | POA: Diagnosis not present

## 2019-01-01 ENCOUNTER — Ambulatory Visit (HOSPITAL_BASED_OUTPATIENT_CLINIC_OR_DEPARTMENT_OTHER)
Admission: RE | Admit: 2019-01-01 | Discharge: 2019-01-01 | Disposition: A | Discharge status: Home / Self Care | Payer: Federal, State, Local not specified - PPO | Source: Ambulatory Visit | Attending: Family Medicine | Admitting: Family Medicine

## 2019-01-01 ENCOUNTER — Other Ambulatory Visit: Payer: Self-pay

## 2019-01-01 DIAGNOSIS — Z1231 Encounter for screening mammogram for malignant neoplasm of breast: Secondary | ICD-10-CM | POA: Diagnosis not present

## 2019-02-10 ENCOUNTER — Ambulatory Visit (INDEPENDENT_AMBULATORY_CARE_PROVIDER_SITE_OTHER): Payer: Federal, State, Local not specified - PPO | Admitting: Family Medicine

## 2019-02-10 ENCOUNTER — Encounter: Payer: Federal, State, Local not specified - PPO | Admitting: Family Medicine

## 2019-02-10 ENCOUNTER — Other Ambulatory Visit: Payer: Self-pay

## 2019-02-10 DIAGNOSIS — I10 Essential (primary) hypertension: Secondary | ICD-10-CM | POA: Diagnosis not present

## 2019-02-10 DIAGNOSIS — K219 Gastro-esophageal reflux disease without esophagitis: Secondary | ICD-10-CM | POA: Diagnosis not present

## 2019-02-10 DIAGNOSIS — R739 Hyperglycemia, unspecified: Secondary | ICD-10-CM

## 2019-02-10 DIAGNOSIS — R6889 Other general symptoms and signs: Secondary | ICD-10-CM | POA: Diagnosis not present

## 2019-02-10 DIAGNOSIS — H9202 Otalgia, left ear: Secondary | ICD-10-CM | POA: Diagnosis not present

## 2019-02-10 DIAGNOSIS — Z20822 Contact with and (suspected) exposure to covid-19: Secondary | ICD-10-CM

## 2019-02-10 DIAGNOSIS — J40 Bronchitis, not specified as acute or chronic: Secondary | ICD-10-CM

## 2019-02-10 MED ORDER — METHYLPREDNISOLONE 4 MG PO TABS: ORAL_TABLET | ORAL | 0 refills | Status: DC

## 2019-02-10 MED ORDER — ALBUTEROL SULFATE HFA 108 (90 BASE) MCG/ACT IN AERS: 2.0000 | INHALATION_SPRAY | Freq: Four times a day (QID) | RESPIRATORY_TRACT | 1 refills | Status: DC | PRN

## 2019-02-10 MED ORDER — FLUCONAZOLE 150 MG PO TABS: 150.0000 mg | ORAL_TABLET | ORAL | 0 refills | Status: DC

## 2019-02-10 MED ORDER — CEFDINIR 300 MG PO CAPS: 300.0000 mg | ORAL_CAPSULE | Freq: Two times a day (BID) | ORAL | 0 refills | Status: AC

## 2019-02-10 NOTE — Progress Notes (Signed)
Son exposed to [place] as of today

## 2019-02-12 LAB — NOVEL CORONAVIRUS, NAA: SARS-CoV-2, NAA: NOT DETECTED

## 2019-02-13 DIAGNOSIS — J40 Bronchitis, not specified as acute or chronic: Secondary | ICD-10-CM | POA: Insufficient documentation

## 2019-02-13 NOTE — Assessment & Plan Note (Signed)
Encouraged to monitor vitals weekly no changes to meds. Encouraged heart healthy diet such as the DASH diet and exercise as tolerated.

## 2019-02-13 NOTE — Assessment & Plan Note (Signed)
Avoid offending foods, start probiotics. Do not eat large meals in late evening and consider raising head of bed.  

## 2019-02-13 NOTE — Progress Notes (Addendum)
Patient ID: [name], female   DOB: [birth year], 48 y.o.   MRN: [id number] Virtual Visit via phone Note  I connected with Julie Bishop on 02/10/19 at  2:20 PM EDT by a phone enabled telemedicine application and verified that I am speaking with the correct person using two identifiers.  Location: Patient: home Provider: office   I discussed the limitations of evaluation and management by telemedicine and the availability of in person appointments. The patient expressed understanding and agreed to proceed. Magdalene Molly, CMA was able to set the patient up on a visit, phone when unable to set up a video visit.     Subjective:    Patient ID: Julie Bishop, female    DOB: 24-Mar-1971, 48 y.o.   MRN: [id number]  No chief complaint on file.   HPI Patient is in today for evaluation of respiratory symptoms and the fact that her son that lives with her has been exposed to [place] at work. She is interested in getting COVID testing. She is anxious and she does have some congestion and a cough. No fevers or chills. No polyuria or polydipsia. She is anxious but otherwise well. Denies CP/palp/SOB/HA/fevers/GI or GU c/o. Taking meds as prescribed  Past Medical History:  Diagnosis Date  . Adjustment disorder 01/25/2017  . Anemia   . Anxiety and depression   . Back pain   . Cancer ([place]) 2008   skin cancer, BCC  . Constipation 11/14/2014  . Depression   . Generalized anxiety disorder   . GERD (gastroesophageal reflux disease)   . Headache 08/24/2015  . History of cardiovascular stress test    GXT 3/19: No ischemic ECG changes  . History of echocardiogram    Echo 2/18:  EF 50-55, GLS -20% (normal), normal wall motion, grade 1 diastolic dysfunction, trivial MR, mild RVE, normal RV SF, trivial TR, PASP 23  . Hyperlipidemia    Elevated, but never taken medication  . Hypertension   . Insomnia 08/24/2015  . Obesity 11/14/2014  . OSA (obstructive sleep apnea) 09/12/2016  . Pain  in joint, shoulder region 11/27/2015  . Palpitations   . Post-menopause 08/23/2015  . Raynauds syndrome   . Sleep apnea    appt with pulmonary Dr in march 2018  . Vitamin B12 deficiency 08/23/2015  . Vitamin D deficiency 08/23/2015    Past Surgical History:  Procedure Laterality Date  . ABDOMINAL HYSTERECTOMY  2010  . BREAST BIOPSY     right, 1996  . ECTOPIC PREGNANCY SURGERY    . SHOULDER OPEN ROTATOR CUFF REPAIR  2017   right  . TUBAL LIGATION     on right  . TYMPANOSTOMY TUBE PLACEMENT      Family History  Problem Relation Age of Onset  . Diabetes Mother   . Depression Mother   . Hypertension Mother   . Hyperlipidemia Mother   . Liver disease Mother   . Sleep apnea Mother   . Obesity Mother   . Heart attack Mother 88       CAD w/MI in mid 62s; s/p PCI  . Diabetes Father   . Hypertension Father   . Cancer Maternal Grandmother        throat  . Heart attack Maternal Grandmother   . Cancer Paternal Grandmother        lung  . Heart disease Paternal Grandmother   . Hypertension Sister        as a teenager    Social History  Socioeconomic History  . Marital status: Married    Spouse name: Jennalyn Pozo  . Number of children: 1  . Years of education: 39  . Highest education level: Not on file  Occupational History  . 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[employer]  . Financial resource strain: Not on file  . Food insecurity    Worry: Not on file    Inability: Not on file  . Transportation needs    Medical: Not on file    Non-medical: Not on file  Tobacco Use  . Smoking status: Never Smoker  . Smokeless tobacco: Never Used  Substance and Sexual Activity  . Alcohol use: No    Alcohol/week: 0.0 standard drinks  . Drug use: No  . Sexual activity: Yes    Partners: Male    Birth control/protection: Post-menopausal    Comment: 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[place], [employer] lives with husband  Lifestyle  . Physical activity     Days per week: Not on file    Minutes per session: Not on file  . Stress: Not on file  Relationships  . Social Herbalist on phone: Not on file    Gets together: Not on file    Attends religious service: Not on file    Active member of club or organization: Not on file    Attends meetings of clubs or organizations: Not on file    Relationship status: Not on file  . Intimate partner violence    Fear of current or ex partner: Not on file    Emotionally abused: Not on file    Physically abused: Not on file    Forced sexual activity: Not on file  Other Topics Concern  . Not on file  Social History Narrative  . Not on file    Outpatient Medications Prior to Visit  Medication Sig Dispense Refill  . DULoxetine (CYMBALTA) 30 MG capsule Take 1 capsule (30 mg total) by mouth 3 (three) times daily. 90 capsule 3  . Enalapril-hydroCHLOROthiazide 5-12.5 MG tablet Take 1 tablet by mouth daily. Please an appt for further refills, 1st attempt 90 tablet 0  . LORazepam (ATIVAN) 0.5 MG tablet Take 1 tablet (0.5 mg total) by mouth 2 (two) times daily as needed for anxiety. 30 tablet 3  . metoprolol succinate (TOPROL-XL) 25 MG 24 hr tablet Take 0.5 tablets (12.5 mg total) by mouth daily. 90 tablet 3  . tiZANidine (ZANAFLEX) 4 MG tablet Take 1 tablet (4 mg total) by mouth 2 (two) times daily as needed for muscle spasms. (Patient not taking: Reported on 10/14/2018) 30 tablet 2  . amoxicillin (AMOXIL) 875 MG tablet Take 1 tablet (875 mg total) by mouth 2 (two) times daily. 14 tablet 0  . fluconazole (DIFLUCAN) 150 MG tablet Take 1 tablet (150 mg total) by mouth once a week. Prn yeast 2 tablet 0   No facility-administered medications prior to visit.     Allergies  Allergen Reactions  . Losartan Other (See Comments)    Diarrhea and abodminal cramping  . Oxycodone Itching    Review of Systems  Constitutional: Positive for malaise/fatigue. Negative for fever.  HENT: Positive for congestion  and ear pain.   Eyes: Negative for blurred vision.  Respiratory: Positive for cough. Negative for shortness of breath.   Cardiovascular: Negative for chest pain, palpitations and leg swelling.  Gastrointestinal: Negative for abdominal pain, blood in stool and nausea.  Genitourinary: Negative for dysuria  and frequency.  Musculoskeletal: Negative for falls.  Skin: Negative for rash.  Neurological: Negative for dizziness, loss of consciousness and headaches.  Endo/Heme/Allergies: Negative for environmental allergies.  Psychiatric/Behavioral: Negative for depression. The patient is nervous/anxious.        Objective:    Physical Exam unable to obtain via phone visit  Wt 228 lb (103.4 kg)   BMI 39.14 kg/m  Wt Readings from Last 3 Encounters:  02/10/19 228 lb (103.4 kg)  08/12/18 184 lb 6.4 oz (83.6 kg)  07/15/18 189 lb (85.7 kg)    Diabetic Foot Exam - Simple   No data filed     Lab Results  Component Value Date   WBC 8.2 07/15/2018   HGB 13.3 07/15/2018   HCT 38.7 07/15/2018   PLT 323.0 07/15/2018   GLUCOSE 71 07/15/2018   CHOL 246 (H) 07/15/2018   TRIG 128.0 07/15/2018   HDL 78.30 07/15/2018   LDLCALC 142 (H) 07/15/2018   ALT 15 07/15/2018   AST 14 07/15/2018   NA 139 07/15/2018   K 4.3 07/15/2018   CL 99 07/15/2018   CREATININE 0.71 07/15/2018   BUN 17 07/15/2018   CO2 31 07/15/2018   TSH 1.89 07/15/2018   HGBA1C 5.2 07/15/2018    Lab Results  Component Value Date   TSH 1.89 07/15/2018   Lab Results  Component Value Date   WBC 8.2 07/15/2018   HGB 13.3 07/15/2018   HCT 38.7 07/15/2018   MCV 86.1 07/15/2018   PLT 323.0 07/15/2018   Lab Results  Component Value Date   NA 139 07/15/2018   K 4.3 07/15/2018   CO2 31 07/15/2018   GLUCOSE 71 07/15/2018   BUN 17 07/15/2018   CREATININE 0.71 07/15/2018   BILITOT 0.5 07/15/2018   ALKPHOS 59 07/15/2018   AST 14 07/15/2018   ALT 15 07/15/2018   PROT 7.2 07/15/2018   ALBUMIN 4.4 07/15/2018   CALCIUM 9.7  07/15/2018   GFR 88.13 07/15/2018   Lab Results  Component Value Date   CHOL 246 (H) 07/15/2018   Lab Results  Component Value Date   HDL 78.30 07/15/2018   Lab Results  Component Value Date   LDLCALC 142 (H) 07/15/2018   Lab Results  Component Value Date   TRIG 128.0 07/15/2018   Lab Results  Component Value Date   CHOLHDL 3 07/15/2018   Lab Results  Component Value Date   HGBA1C 5.2 07/15/2018       Assessment & Plan:   Problem List Items Addressed This Visit    GERD (gastroesophageal reflux disease)    Avoid offending foods, start probiotics. Do not eat large meals in late evening and consider raising head of bed.       Hypertension    Encouraged to monitor vitals weekly no changes to meds. Encouraged heart healthy diet such as the DASH diet and exercise as tolerated.       Hyperglycemia    hgba1c acceptable, minimize simple carbs. Increase exercise as tolerated.       Bronchitis    COVID test negative despite her son being exposed to [place] at work. Patient given prescriptions for Cefdinir, Medrol dosepak, albuterol and given Diflucan to use prn after antibiotic use if her symptoms worsen.        Other Visit Diagnoses    Left ear pain       Relevant Medications   fluconazole (DIFLUCAN) 150 MG tablet      I have discontinued [name]  L. Haupert's amoxicillin. I am also having her start on cefdinir, albuterol, and methylPREDNISolone. Additionally, I am having her maintain her LORazepam, tiZANidine, metoprolol succinate, DULoxetine, Enalapril-hydroCHLOROthiazide, and fluconazole.  Meds ordered this encounter  Medications  . fluconazole (DIFLUCAN) 150 MG tablet    Sig: Take 1 tablet (150 mg total) by mouth once a week. Prn yeast    Dispense:  2 tablet    Refill:  0  . cefdinir (OMNICEF) 300 MG capsule    Sig: Take 1 capsule (300 mg total) by mouth 2 (two) times daily for 10 days.    Dispense:  20 capsule    Refill:  0  . albuterol (VENTOLIN HFA)  108 (90 Base) MCG/ACT inhaler    Sig: Inhale 2 puffs into the lungs every 6 (six) hours as needed for wheezing or shortness of breath.    Dispense:  18 g    Refill:  1  . methylPREDNISolone (MEDROL) 4 MG tablet    Sig: 5 tab po qd X 1d then 4 tab po qd X 1d then 3 tab po qd X 1d then 2 tab po qd then 1 tab po qd    Dispense:  15 tablet    Refill:  0     I discussed the assessment and treatment plan with the patient. The patient was provided an opportunity to ask questions and all were answered. The patient agreed with the plan and demonstrated an understanding of the instructions.   The patient was advised to call back or seek an in-person evaluation if the symptoms worsen or if the condition fails to improve as anticipated.  I provided 25 minutes of non-face-to-face time during this encounter.   Penni Homans, MD

## 2019-02-13 NOTE — Assessment & Plan Note (Addendum)
COVID test negative despite her son being exposed to [place] at work. Patient given prescriptions for Cefdinir, Medrol dosepak, albuterol and given Diflucan to use prn after antibiotic use if her symptoms worsen.

## 2019-02-13 NOTE — Assessment & Plan Note (Signed)
hgba1c acceptable, minimize simple carbs. Increase exercise as tolerated.  

## 2019-03-26 ENCOUNTER — Other Ambulatory Visit: Payer: Self-pay | Admitting: Family Medicine

## 2019-03-31 ENCOUNTER — Other Ambulatory Visit: Payer: Self-pay | Admitting: Family Medicine

## 2019-03-31 DIAGNOSIS — G4733 Obstructive sleep apnea (adult) (pediatric): Secondary | ICD-10-CM | POA: Diagnosis not present

## 2019-04-01 NOTE — Telephone Encounter (Signed)
Last written: 07/15/18 Last ov: 02/10/19 Next ov: nothing scheduled Contract: due UDS: due

## 2019-04-09 ENCOUNTER — Other Ambulatory Visit: Payer: Self-pay | Admitting: Physician Assistant

## 2019-05-01 ENCOUNTER — Other Ambulatory Visit: Payer: Self-pay | Admitting: Family Medicine

## 2019-05-01 MED ORDER — DULOXETINE HCL 30 MG PO CPEP: 30.0000 mg | ORAL_CAPSULE | Freq: Three times a day (TID) | ORAL | 0 refills | Status: DC

## 2019-05-01 MED ORDER — ENALAPRIL-HYDROCHLOROTHIAZIDE 5-12.5 MG PO TABS: 1.0000 | ORAL_TABLET | Freq: Every day | ORAL | 0 refills | Status: DC

## 2019-05-01 NOTE — Telephone Encounter (Signed)
Medication Refill - Medication:  Enalapril-hydroCHLOROthiazide 5-12.5 MG tablet DULoxetine (CYMBALTA) 30 MG capsule   Has the patient contacted their pharmacy?  Yes advised to call office. Pt states she is wanting this today and spoke with CMA to have filled. Pt is going on day 3 without medication.  Preferred Pharmacy (with phone number or street name):  [facility], [place] [phone] (Phone) [phone] (Fax)   Agent: Please be advised that RX refills may take up to 3 business days. We ask that you follow-up with your pharmacy.

## 2019-05-01 NOTE — Telephone Encounter (Signed)
Requested medication (s) are due for refill today: yes  Requested medication (s) are on the active medication list: yes  Last refill:  04/09/2019  Future visit scheduled: yes  Notes to clinic:  Review for refill Filled by different provider    Requested Prescriptions  Pending Prescriptions Disp Refills   Enalapril-hydroCHLOROthiazide 5-12.5 MG tablet 15 tablet 0    Sig: Take 1 tablet by mouth daily. Please make overdue appt with Dr. Curt Bears before anymore refills. 2nd attempt     Cardiovascular:  ACEI + Diuretic Combos Failed - 05/01/2019  9:38 AM      Failed - Na in normal range and within 180 days    Sodium  Date Value Ref Range Status  07/15/2018 139 135 - 145 mEq/L Final  10/22/2017 138 134 - 144 mmol/L Final         Failed - K in normal range and within 180 days    Potassium  Date Value Ref Range Status  07/15/2018 4.3 3.5 - 5.1 mEq/L Final         Failed - Cr in normal range and within 180 days    Creatinine, Ser  Date Value Ref Range Status  07/15/2018 0.71 0.40 - 1.20 mg/dL Final         Failed - Ca in normal range and within 180 days    Calcium  Date Value Ref Range Status  07/15/2018 9.7 8.4 - 10.5 mg/dL Final         Passed - Patient is not pregnant      Passed - Last BP in normal range    BP Readings from Last 1 Encounters:  08/12/18 114/60         Passed - Valid encounter within last 6 months    Recent Outpatient Visits          2 months ago Left ear pain   [facility], MD   6 months ago Left ear pain   [facility] [place], [place], DO   6 months ago Vitamin D deficiency   [facility], Bonnita Levan, MD   8 months ago Pharyngitis, unspecified etiology   [facility] at [facility], MD   9 months ago High risk medication use   [facility]  at [facility] [place], MD      Future Appointments            In 1 month [name], MD [place] at [facility], [place]           Signed Prescriptions Disp Refills   DULoxetine (CYMBALTA) 30 MG capsule 90 capsule 0    Sig: Take 1 capsule (30 mg total) by mouth 3 (three) times daily.     Psychiatry: Antidepressants - SNRI Passed - 05/01/2019  9:38 AM      Passed - Last BP in normal range    BP Readings from Last 1 Encounters:  08/12/18 114/60         Passed - Valid encounter within last 6 months    Recent Outpatient Visits          2 months ago Left ear pain   [facility] at [facility] [place], MD   6 months ago Left ear pain  [facility] at [facility], DO   6 months ago Vitamin D deficiency   [facility] at [place], MD   8 months ago Pharyngitis, unspecified etiology   [facility] at [place], MD   9 months ago High risk medication use   [facility] at [place], MD      Future Appointments            In 1 month [name], MD [facility] at [place] - Completed PHQ-2 or PHQ-9 in the last 360 days.

## 2019-05-12 ENCOUNTER — Other Ambulatory Visit: Payer: Self-pay | Admitting: Family Medicine

## 2019-05-19 ENCOUNTER — Telehealth: Payer: Self-pay | Admitting: Family Medicine

## 2019-05-19 ENCOUNTER — Other Ambulatory Visit: Payer: Self-pay | Admitting: Family Medicine

## 2019-05-19 MED ORDER — ENALAPRIL-HYDROCHLOROTHIAZIDE 5-12.5 MG PO TABS: 1.0000 | ORAL_TABLET | Freq: Every day | ORAL | 1 refills | Status: DC

## 2019-05-19 NOTE — Telephone Encounter (Signed)
All set!

## 2019-05-19 NOTE — Telephone Encounter (Signed)
Enalapril-hydroCHLOROthiazide 5-12.5 MG tablet      Patient is requesting refill. Patient has appointment in Jan.  Patient is out of medication.    [place], [place] Phone:  [phone]  Fax:  [phone]

## 2019-05-29 ENCOUNTER — Encounter (HOSPITAL_BASED_OUTPATIENT_CLINIC_OR_DEPARTMENT_OTHER): Payer: Self-pay | Admitting: *Deleted

## 2019-05-29 ENCOUNTER — Emergency Department (HOSPITAL_BASED_OUTPATIENT_CLINIC_OR_DEPARTMENT_OTHER): Payer: Federal, State, Local not specified - PPO

## 2019-05-29 ENCOUNTER — Emergency Department (HOSPITAL_BASED_OUTPATIENT_CLINIC_OR_DEPARTMENT_OTHER)
Admission: EM | Admit: 2019-05-29 | Discharge: 2019-05-29 | Disposition: A | Discharge status: Home / Self Care | Payer: Federal, State, Local not specified - PPO | Attending: Emergency Medicine | Admitting: Emergency Medicine

## 2019-05-29 ENCOUNTER — Other Ambulatory Visit: Payer: Self-pay

## 2019-05-29 DIAGNOSIS — J069 Acute upper respiratory infection, unspecified: Secondary | ICD-10-CM | POA: Diagnosis not present

## 2019-05-29 DIAGNOSIS — E785 Hyperlipidemia, unspecified: Secondary | ICD-10-CM | POA: Diagnosis not present

## 2019-05-29 DIAGNOSIS — Z79899 Other long term (current) drug therapy: Secondary | ICD-10-CM | POA: Insufficient documentation

## 2019-05-29 DIAGNOSIS — Z20822 Contact with and (suspected) exposure to covid-19: Secondary | ICD-10-CM | POA: Diagnosis not present

## 2019-05-29 DIAGNOSIS — B9789 Other viral agents as the cause of diseases classified elsewhere: Secondary | ICD-10-CM | POA: Diagnosis not present

## 2019-05-29 DIAGNOSIS — Z888 Allergy status to other drugs, medicaments and biological substances status: Secondary | ICD-10-CM | POA: Diagnosis not present

## 2019-05-29 DIAGNOSIS — R05 Cough: Secondary | ICD-10-CM | POA: Diagnosis not present

## 2019-05-29 DIAGNOSIS — I10 Essential (primary) hypertension: Secondary | ICD-10-CM | POA: Insufficient documentation

## 2019-05-29 DIAGNOSIS — Z885 Allergy status to narcotic agent status: Secondary | ICD-10-CM | POA: Insufficient documentation

## 2019-05-29 MED ORDER — BENZONATATE 100 MG PO CAPS: 100.0000 mg | ORAL_CAPSULE | Freq: Three times a day (TID) | ORAL | 0 refills | Status: DC

## 2019-05-29 NOTE — ED Provider Notes (Signed)
[facility] EMERGENCY DEPARTMENT Provider Note   CSN: [id number] Arrival date & time: 05/29/19  1459     History Chief Complaint  Patient presents with  . Cough    Julie Bishop is a 49 y.o. female history GERD, anxiety, obesity, hypertension, hyperlipidemia, Raynaud's, skin cancer.  Patient presents today for 2-day history of intermittent fever, cough, and headache.  Patient reports mild nonproductive cough over the past 2 days, no aggravating or alleviating factors, no associated shortness of breath or chest pain.  She reports feeling warm at home but has not measured a fever, she reports her temperature at the ED was 99.5 F, she reports she has not measured a full temperature at home.  Patient describes headache as a mild bilateral aching sensation nonradiating self resolving.  Patient reports history of similar headaches in the past and this headache improves with Tylenol, she reports she does not want a medication for headache in the ER, she took Tylenol just prior to arrival.  Patient denies vision changes, neck stiffness, chest pain/shortness of breath, hemoptysis, abdominal pain, nausea/vomiting, diarrhea, dysuria/hematuria, extremity swelling/color change from fall/injury, numbness/tingling, weakness, shortness of breath with exertion, myalgias/arthralgias, loss of taste/smell or any additional concerns.  HPI     Past Medical History:  Diagnosis Date  . Adjustment disorder 01/25/2017  . Anemia   . Anxiety and depression   . Back pain   . Cancer ([place]) 2008   skin cancer, BCC  . Constipation 11/14/2014  . Depression   . Generalized anxiety disorder   . GERD (gastroesophageal reflux disease)   . Headache 08/24/2015  . History of cardiovascular stress test    GXT 3/19: No ischemic ECG changes  . History of echocardiogram    Echo 2/18:  EF 50-55, GLS -20% (normal), normal wall motion, grade 1 diastolic dysfunction, trivial MR, mild RVE, normal RV SF, trivial  TR, PASP 23  . Hyperlipidemia    Elevated, but never taken medication  . Hypertension   . Insomnia 08/24/2015  . Obesity 11/14/2014  . OSA (obstructive sleep apnea) 09/12/2016  . Pain in joint, shoulder region 11/27/2015  . Palpitations   . Post-menopause 08/23/2015  . Raynauds syndrome   . Sleep apnea    appt with pulmonary Dr in march 2018  . Vitamin B12 deficiency 08/23/2015  . Vitamin D deficiency 08/23/2015    Patient Active Problem List   Diagnosis Date Noted  . Bronchitis 02/13/2019  . Hip pain, left 07/15/2018  . Otitis media of left ear 01/29/2018  . Hyperglycemia 05/02/2017  . Adjustment disorder 01/25/2017  . OSA (obstructive sleep apnea) 09/12/2016  . SOB (shortness of breath) 07/01/2016  . Chest pain 07/01/2016  . Headache disorder 04/01/2016  . Snoring 04/01/2016  . Pain in joint, shoulder region 11/27/2015  . Insomnia 08/24/2015  . Vitamin D deficiency 08/23/2015  . Vitamin B12 deficiency 08/23/2015  . Preventative health care 08/23/2015  . Post-menopause 08/23/2015  . Polydipsia 12/08/2014  . Constipation 11/14/2014  . Obesity 11/14/2014  . Anxiety and depression   . Cancer ([place])   . GERD (gastroesophageal reflux disease)   . Hyperlipidemia   . Hypertension     Past Surgical History:  Procedure Laterality Date  . ABDOMINAL HYSTERECTOMY  2010  . BREAST BIOPSY     right, 1996  . ECTOPIC PREGNANCY SURGERY    . SHOULDER OPEN ROTATOR CUFF REPAIR  2017   right  . TUBAL LIGATION     on right  .  TYMPANOSTOMY TUBE PLACEMENT       OB History    Gravida  3   Para  1   Term  1   Preterm      AB      Living  1     SAB      TAB      Ectopic      Multiple      Live Births              Family History  Problem Relation Age of Onset  . Diabetes Mother   . Depression Mother   . Hypertension Mother   . Hyperlipidemia Mother   . Liver disease Mother   . Sleep apnea Mother   . Obesity Mother   . Heart attack Mother 16       CAD w/MI  in mid 61s; s/p PCI  . Diabetes Father   . Hypertension Father   . Cancer Maternal Grandmother        throat  . Heart attack Maternal Grandmother   . Cancer Paternal Grandmother        lung  . Heart disease Paternal Grandmother   . Hypertension Sister        as a teenager    Social History   Tobacco Use  . Smoking status: Never Smoker  . Smokeless tobacco: Never Used  Substance Use Topics  . Alcohol use: No    Alcohol/week: 0.0 standard drinks  . Drug use: No    Home Medications Prior to Admission medications   Medication Sig Start Date End Date Taking? Authorizing Provider  acetaminophen (TYLENOL) 500 MG tablet Take 1,000 mg by mouth every 6 (six) hours as needed.   Yes [provider]  albuterol (VENTOLIN HFA) 108 (90 Base) MCG/ACT inhaler Inhale 2 puffs into the lungs every 6 (six) hours as needed for wheezing or shortness of breath. 02/10/19   [name], MD  benzonatate (TESSALON) 100 MG capsule Take 1 capsule (100 mg total) by mouth every 8 (eight) hours. 05/29/19   Nuala Alpha A, PA-C  DULoxetine (CYMBALTA) 30 MG capsule Take 1 capsule (30 mg total) by mouth 3 (three) times daily. 05/01/19   [name], MD  Enalapril-hydroCHLOROthiazide 5-12.5 MG tablet Take 1 tablet by mouth daily. Please make overdue appt with Dr. Curt Bears before anymore refills. 2nd attempt 05/19/19   [name], MD  fluconazole (DIFLUCAN) 150 MG tablet Take 1 tablet (150 mg total) by mouth once a week. Prn yeast 02/10/19   [name], MD  LORazepam (ATIVAN) 0.5 MG tablet TAKE ONE TABLET BY MOUTH TWICE A DAY AS NEEDED FOR ANXIETY 04/01/19   [name], MD  methylPREDNISolone (MEDROL) 4 MG tablet 5 tab po qd X 1d then 4 tab po qd X 1d then 3 tab po qd X 1d then 2 tab po qd then 1 tab po qd 02/10/19   [name], MD  metoprolol succinate (TOPROL-XL) 25 MG 24 hr tablet Take 0.5 tablets (12.5 mg total) by mouth daily. 07/15/18 07/10/19  [name], MD  metoprolol  succinate (TOPROL-XL) 50 MG 24 hr tablet TAKE ONE TABLET BY MOUTH TWO TIMES A DAY 05/12/19   [name], MD  tiZANidine (ZANAFLEX) 4 MG tablet Take 1 tablet (4 mg total) by mouth 2 (two) times daily as needed for muscle spasms. Patient not taking: Reported on 10/14/2018 07/15/18   [name], MD    Allergies  Losartan and Oxycodone  Review of Systems   Review of Systems Ten systems are reviewed and are negative for acute change except as noted in the HPI  Physical Exam Updated Vital Signs BP (!) 148/92   Pulse 87   Temp 99.5 F (37.5 C)   Resp 16   Ht 5\' 2"  (1.575 m)   Wt 106.6 kg   SpO2 100%   BMI 42.98 kg/m   Physical Exam Constitutional:      General: She is not in acute distress.    Appearance: Normal appearance. She is well-developed. She is not ill-appearing or diaphoretic.  HENT:     Head: Normocephalic and atraumatic.     Right Ear: External ear normal.     Left Ear: External ear normal.     Nose: Nose normal.     Mouth/Throat:     Mouth: Mucous membranes are moist.     Pharynx: Oropharynx is clear.  Eyes:     General: Vision grossly intact. Gaze aligned appropriately.     Pupils: Pupils are equal, round, and reactive to light.  Neck:     Trachea: Trachea and phonation normal. No tracheal deviation.  Cardiovascular:     Rate and Rhythm: Normal rate and regular rhythm.     Pulses: Normal pulses.     Heart sounds: Normal heart sounds.  Pulmonary:     Effort: Pulmonary effort is normal. No accessory muscle usage or respiratory distress.     Breath sounds: Normal breath sounds and air entry.  Chest:     Chest wall: No tenderness.  Abdominal:     General: There is no distension.     Palpations: Abdomen is soft.     Tenderness: There is no abdominal tenderness. There is no guarding or rebound.  Musculoskeletal:        General: No tenderness. Normal range of motion.     Cervical back: Normal range of motion.     Right lower leg: No edema.     Left  lower leg: No edema.  Skin:    General: Skin is warm and dry.  Neurological:     Mental Status: She is alert.     GCS: GCS eye subscore is 4. GCS verbal subscore is 5. GCS motor subscore is 6.     Comments: Mental Status: Alert, oriented, thought content appropriate, able to give a coherent history. Speech fluent without evidence of aphasia. Able to follow 2 step commands without difficulty. Cranial Nerves: II: Peripheral visual fields grossly normal, pupils equal, round, reactive to light III,IV, VI: ptosis not present, extra-ocular motions intact bilaterally V,VII: smile symmetric, eyebrows raise symmetric, facial light touch sensation equal VIII: hearing grossly normal to voice X: uvula elevates symmetrically XI: bilateral shoulder shrug symmetric and strong XII: midline tongue extension without fassiculations Motor: Normal tone. 5/5 strength in upper and lower extremities bilaterally including strong and equal grip strength and dorsiflexion/plantar flexion Sensory: Sensation intact to light touch in all extremities.Negative Romberg.  Deep Tendon Reflexes: No clonus of the feet Cerebellar: normal finger-to-nose with bilateral upper extremities. Normal heel-to -shin balance bilaterally of the lower extremity. No pronator drift.  Gait: normal gait and balance CV: distal pulses palpable throughout  Psychiatric:        Behavior: Behavior normal.     ED Results / Procedures / Treatments   Labs (all labs ordered are listed, but only abnormal results are displayed) Labs Reviewed  NOVEL CORONAVIRUS, NAA (HOSP ORDER, SEND-OUT TO REF LAB;  TAT 18-24 HRS)    EKG None  Radiology DG Chest Portable 1 View  Result Date: 05/29/2019 CLINICAL DATA:  Cough EXAM: PORTABLE CHEST 1 VIEW COMPARISON:  10/03/2017 FINDINGS: The heart size and mediastinal contours are within normal limits. Lungs are clear. The visualized skeletal structures are unremarkable. IMPRESSION: No acute abnormality  of the lungs in AP portable projection. Electronically Signed   By: Eddie Candle M.D.   On: 05/29/2019 19:02    Procedures Procedures (including critical care time)  Medications Ordered in ED Medications - No data to display  ED Course  I have reviewed the triage vital signs and the nursing notes.  Pertinent labs & imaging results that were available during my care of the patient were reviewed by me and considered in my medical decision making (see chart for details).    MDM Rules/Calculators/A&P                     49 year old female arrives with 2-day history of intermittent low-grade fever, mild headache and nonproductive cough.  She is requesting Covid test today.  On evaluation she is well-appearing no acute distress, cranial nerves intact, no meningeal signs, airway patent without evidence of deep space infection, lungs clear to auscultation bilaterally, heart regular rate and rhythm without murmur, abdomen soft nontender without peritoneal signs, neurovascular intact to all 4 extremities without evidence of DVT.  No tachycardia or hypoxia on room air.  She denies any history of shortness of breath.  Patient ambulated on room air without hypoxia or shortness of breath with nursing staff.  Chest x-ray obtained and without acute abnormality.  Outpatient Covid test sent, patient is to follow-up on her results on her MyChart account and to continue quarantining until symptom-free x10 days.  She has no complaints or concerns at this time she will be provided with cough medication and given handout.  Suspect patient symptoms today secondary to viral upper respiratory infection, possibly COVID-19 given current pandemic.  There is no indication for further evaluation, ED treatment or admission of this patient.  At this time there does not appear to be any evidence of an acute emergency medical condition and the patient appears stable for discharge with appropriate outpatient follow up. Diagnosis was  discussed with patient who verbalizes understanding of care plan and is agreeable to discharge. I have discussed return precautions with patient who verbalizes understanding of return precautions. Patient encouraged to follow-up with their PCP. All questions answered.  Julie Bishop was evaluated in Emergency Department on 05/29/2019 for the symptoms described in the history of present illness. She was evaluated in the context of the global COVID-19 pandemic, which necessitated consideration that the patient might be at risk for infection with the SARS-CoV-2 virus that causes COVID-19. Institutional protocols and algorithms that pertain to the evaluation of patients at risk for COVID-19 are in a state of rapid change based on information released by regulatory bodies including the CDC and federal and state organizations. These policies and algorithms were followed during the patient's care in the ED.   Note: Portions of this report may have been transcribed using voice recognition software. Every effort was made to ensure accuracy; however, inadvertent computerized transcription errors may still be present. Final Clinical Impression(s) / ED Diagnoses Final diagnoses:  Viral URI with cough  Suspected COVID-19 virus infection    Rx / DC Orders ED Discharge Orders         Ordered    benzonatate (TESSALON) 100  MG capsule  Every 8 hours     05/29/19 1951           Gari Crown 05/29/19 Grayland Ormond, MD 05/31/19 2154

## 2019-05-29 NOTE — ED Notes (Signed)
O2 sat remained 100% while ambulating

## 2019-05-29 NOTE — Discharge Instructions (Signed)
You have been diagnosed today with viral URI with cough, suspected Covid infection.  At this time there does not appear to be the presence of an emergent medical condition, however there is always the potential for conditions to change. Please read and follow the below instructions.  Please return to the Emergency Department immediately for any new or worsening symptoms. Please be sure to follow up with your Primary Care Provider within one week regarding your visit today; please call their office to schedule an appointment even if you are feeling better for a follow-up visit. You may use the cough medication Tessalon as prescribed to help with your symptoms.  Please drink plenty water and get plenty of rest.  You may continue use over-the-counter anti-inflammatory such as Tylenol and ibuprofen as directed on the packaging to help with your symptoms. Your Covid test is currently pending and will result on your MyChart account in 1-2 days.  Please continue to quarantine to avoid exposing others to a viral infection.  Call your primary care doctor to discuss the results of your tests and to schedule a follow-up telephone visit next week.  Get help right away if: You have shortness of breath that gets worse. You have very bad or constant: Headache. Ear pain. Pain in your forehead, behind your eyes, and over your cheekbones (sinus pain). Chest pain. You have long-lasting (chronic) lung disease along with any of these: Wheezing. Long-lasting cough. Coughing up blood. A change in your usual mucus. You have a stiff neck. You have changes in your: Vision. Hearing. Thinking. Mood. You have trouble breathing. You have pain or pressure in your chest. You have confusion. You have bluish lips and fingernails. You have difficulty waking from sleep. You have any new/concerning or worsening of symptoms.  Please read the additional information packets attached to your discharge summary.  Do not  take your medicine if  develop an itchy rash, swelling in your mouth or lips, or difficulty breathing; call 911 and seek immediate emergency medical attention if this occurs.  Note: Portions of this text may have been transcribed using voice recognition software. Every effort was made to ensure accuracy; however, inadvertent computerized transcription errors may still be present.

## 2019-05-29 NOTE — ED Triage Notes (Signed)
Pt c/o fever , cough , h/a x 2 days

## 2019-05-30 LAB — NOVEL CORONAVIRUS, NAA (HOSP ORDER, SEND-OUT TO REF LAB; TAT 18-24 HRS): SARS-CoV-2, NAA: NOT DETECTED

## 2019-06-02 ENCOUNTER — Encounter: Payer: Self-pay | Admitting: Family Medicine

## 2019-06-03 ENCOUNTER — Other Ambulatory Visit: Payer: Self-pay | Admitting: Family Medicine

## 2019-06-03 MED ORDER — CEFDINIR 300 MG PO CAPS: 300.0000 mg | ORAL_CAPSULE | Freq: Two times a day (BID) | ORAL | 0 refills | Status: AC

## 2019-06-04 ENCOUNTER — Ambulatory Visit: Payer: Federal, State, Local not specified - PPO | Admitting: Family Medicine

## 2019-06-04 ENCOUNTER — Ambulatory Visit: Payer: Federal, State, Local not specified - PPO | Admitting: Internal Medicine

## 2019-06-16 ENCOUNTER — Ambulatory Visit: Payer: Federal, State, Local not specified - PPO | Admitting: Family Medicine

## 2019-06-29 DIAGNOSIS — G4733 Obstructive sleep apnea (adult) (pediatric): Secondary | ICD-10-CM | POA: Diagnosis not present

## 2019-07-08 ENCOUNTER — Other Ambulatory Visit: Payer: Self-pay

## 2019-07-09 ENCOUNTER — Ambulatory Visit (INDEPENDENT_AMBULATORY_CARE_PROVIDER_SITE_OTHER): Payer: Federal, State, Local not specified - PPO | Admitting: Family Medicine

## 2019-07-09 ENCOUNTER — Other Ambulatory Visit: Payer: Self-pay

## 2019-07-09 ENCOUNTER — Encounter: Payer: Self-pay | Admitting: Family Medicine

## 2019-07-09 VITALS — BP 144/100 | HR 74 | Temp 96.8°F | Resp 16 | Ht 63.0 in | Wt 238.0 lb

## 2019-07-09 DIAGNOSIS — E782 Mixed hyperlipidemia: Secondary | ICD-10-CM | POA: Diagnosis not present

## 2019-07-09 DIAGNOSIS — I10 Essential (primary) hypertension: Secondary | ICD-10-CM

## 2019-07-09 DIAGNOSIS — R32 Unspecified urinary incontinence: Secondary | ICD-10-CM | POA: Diagnosis not present

## 2019-07-09 DIAGNOSIS — E559 Vitamin D deficiency, unspecified: Secondary | ICD-10-CM

## 2019-07-09 DIAGNOSIS — E6609 Other obesity due to excess calories: Secondary | ICD-10-CM

## 2019-07-09 DIAGNOSIS — R002 Palpitations: Secondary | ICD-10-CM

## 2019-07-09 DIAGNOSIS — Z6839 Body mass index (BMI) 39.0-39.9, adult: Secondary | ICD-10-CM

## 2019-07-09 DIAGNOSIS — E538 Deficiency of other specified B group vitamins: Secondary | ICD-10-CM | POA: Diagnosis not present

## 2019-07-09 DIAGNOSIS — Z0000 Encounter for general adult medical examination without abnormal findings: Secondary | ICD-10-CM

## 2019-07-09 MED ORDER — METOPROLOL SUCCINATE ER 50 MG PO TB24: 50.0000 mg | ORAL_TABLET | Freq: Two times a day (BID) | ORAL | 1 refills | Status: DC

## 2019-07-09 MED ORDER — DULOXETINE HCL 30 MG PO CPEP: 30.0000 mg | ORAL_CAPSULE | Freq: Three times a day (TID) | ORAL | 1 refills | Status: DC

## 2019-07-09 NOTE — Assessment & Plan Note (Signed)
Supplement and monitor 

## 2019-07-09 NOTE — Assessment & Plan Note (Signed)
Encouraged DASH diet, decrease po intake and increase exercise as tolerated. Needs 7-8 hours of sleep nightly. Avoid trans fats, eat small, frequent meals every 4-5 hours with lean proteins, complex carbs and healthy fats. Minimize simple carbs, consider NOOM or Weight Watchers APP

## 2019-07-09 NOTE — Assessment & Plan Note (Signed)
Poorly controlled she was actually only taking her Metoprolol XL 50 mg once a day so she agrees to increase to bid use. encouraged DASH diet, minimize caffeine and obtain adequate sleep. Report concerning symptoms and follow up as directed and as needed

## 2019-07-09 NOTE — Assessment & Plan Note (Signed)
Supplement and montior

## 2019-07-09 NOTE — Assessment & Plan Note (Signed)
Encouraged heart healthy diet, increase exercise, avoid trans fats, consider a krill oil cap daily 

## 2019-07-09 NOTE — Patient Instructions (Addendum)
NOOM APP or Weight Watchers APP  Omron Blood Pressure cuff, upper arm, want BP 100-140/60-90 Pulse oximeter, want oxygen in 90s  Weekly vitals  Take Multivitamin with minerals, selenium Vitamin D 1000-2000 IU daily Probiotic with lactobacillus and bifidophilus Asprin EC 81 mg daily  Melatonin 2-5 mg at bedtime  [email or web address]  Hydrate with 60-80 ounces of fluids days.   kegel exercises sets of 10 twice a day    Preventive Care 28-82 Years Old, Female Preventive care refers to visits with your health care provider and lifestyle choices that can promote health and wellness. This includes:  A yearly physical exam. This may also be called an annual well check.  Regular dental visits and eye exams.  Immunizations.  Screening for certain conditions.  Healthy lifestyle choices, such as eating a healthy diet, getting regular exercise, not using drugs or products that contain nicotine and tobacco, and limiting alcohol use. What can I expect for my preventive care visit? Physical exam Your health care provider will check your:  Height and weight. This may be used to calculate body mass index (BMI), which tells if you are at a healthy weight.  Heart rate and blood pressure.  Skin for abnormal spots. Counseling Your health care provider may ask you questions about your:  Alcohol, tobacco, and drug use.  Emotional well-being.  Home and relationship well-being.  Sexual activity.  Eating habits.  Work and work Statistician.  Method of birth control.  Menstrual cycle.  Pregnancy history. What immunizations do I need?  Influenza (flu) vaccine  This is recommended every year. Tetanus, diphtheria, and pertussis (Tdap) vaccine  You may need a Td booster every 10 years. Varicella (chickenpox) vaccine  You may need this if you have not been vaccinated. Zoster (shingles) vaccine  You may need this after age 31. Measles,  mumps, and rubella (MMR) vaccine  You may need at least one dose of MMR if you were born in 1957 or later. You may also need a second dose. Pneumococcal conjugate (PCV13) vaccine  You may need this if you have certain conditions and were not previously vaccinated. Pneumococcal polysaccharide (PPSV23) vaccine  You may need one or two doses if you smoke cigarettes or if you have certain conditions. Meningococcal conjugate (MenACWY) vaccine  You may need this if you have certain conditions. Hepatitis A vaccine  You may need this if you have certain conditions or if you travel or work in places where you may be exposed to hepatitis A. Hepatitis B vaccine  You may need this if you have certain conditions or if you travel or work in places where you may be exposed to hepatitis B. Haemophilus influenzae type b (Hib) vaccine  You may need this if you have certain conditions. Human papillomavirus (HPV) vaccine  If recommended by your health care provider, you may need three doses over 6 months. You may receive vaccines as individual doses or as more than one vaccine together in one shot (combination vaccines). Talk with your health care provider about the risks and benefits of combination vaccines. What tests do I need? Blood tests  Lipid and cholesterol levels. These may be checked every 5 years, or more frequently if you are over 46 years old.  Hepatitis C test.  Hepatitis B test. Screening  Lung cancer screening. You may have this screening every year starting at age 58 if you have a 30-pack-year history of smoking and currently smoke or have quit within the past 15 years.  Colorectal cancer screening. All adults should have this screening starting at age 10 and continuing until age 87. Your health care provider may recommend screening at age 18 if you are at increased risk. You will have tests every 1-10 years, depending on your results and the type of screening test.  Diabetes  screening. This is done by checking your blood sugar (glucose) after you have not eaten for a while (fasting). You may have this done every 1-3 years.  Mammogram. This may be done every 1-2 years. Talk with your health care provider about when you should start having regular mammograms. This may depend on whether you have a family history of breast cancer.  BRCA-related cancer screening. This may be done if you have a family history of breast, ovarian, tubal, or peritoneal cancers.  Pelvic exam and Pap test. This may be done every 3 years starting at age 39. Starting at age 44, this may be done every 5 years if you have a Pap test in combination with an HPV test. Other tests  Sexually transmitted disease (STD) testing.  Bone density scan. This is done to screen for osteoporosis. You may have this scan if you are at high risk for osteoporosis. Follow these instructions at home: Eating and drinking  Eat a diet that includes fresh fruits and vegetables, whole grains, lean protein, and low-fat dairy.  Take vitamin and mineral supplements as recommended by your health care provider.  Do not drink alcohol if: ? Your health care provider tells you not to drink. ? You are pregnant, may be pregnant, or are planning to become pregnant.  If you drink alcohol: ? Limit how much you have to 0-1 drink a day. ? Be aware of how much alcohol is in your drink. In the [place] one drink equals one 12 oz bottle of beer (355 mL), one 5 oz glass of wine (148 mL), or one 1 oz glass of hard liquor (44 mL). Lifestyle  Take daily care of your teeth and gums.  Stay active. Exercise for at least 30 minutes on 5 or more days each week.  Do not use any products that contain nicotine or tobacco, such as cigarettes, e-cigarettes, and chewing tobacco. If you need help quitting, ask your health care provider.  If you are sexually active, practice safe sex. Use a condom or other form of birth control (contraception) in  order to prevent pregnancy and STIs (sexually transmitted infections).  If told by your health care provider, take low-dose aspirin daily starting at age 8. What's next?  Visit your health care provider once a year for a well check visit.  Ask your health care provider how often you should have your eyes and teeth checked.  Stay up to date on all vaccines. This information is not intended to replace advice given to you by your health care provider. Make sure you discuss any questions you have with your health care provider. Document Revised: 01/23/2018 Document Reviewed: 01/23/2018 Elsevier Patient Education  2020 [employer].

## 2019-07-09 NOTE — Progress Notes (Signed)
Subjective:    Patient ID: Julie Bishop, female    DOB: [birth year], 49 y.o.   MRN: [id number]  Chief Complaint  Patient presents with  . Annual Exam  . Hypothyroidism    HPI Patient is in today for annual preventative exam. She is working from home during the pandemic except for one day a week. She is frustrated with her weight gain and is not as active as she once was. No recent febrile illness or hospitalizations. She is having increasing trouble with urinary incontinence and can just occur while walking. No dysuria or hematuria. She has also noted an increase in her palpitations which now happen about twice a week and generally only last for seconds no associated symptoms although she still experience chest pain but not at the same time. Denies CP/palp/SOB/HA/congestion/fevers/GI c/o. Taking meds as prescribed  Past Medical History:  Diagnosis Date  . Adjustment disorder 01/25/2017  . Anemia   . Anxiety and depression   . Back pain   . Cancer ([place]) 2008   skin cancer, BCC  . Constipation 11/14/2014  . Depression   . Generalized anxiety disorder   . GERD (gastroesophageal reflux disease)   . Headache 08/24/2015  . History of cardiovascular stress test    GXT 3/19: No ischemic ECG changes  . History of echocardiogram    Echo 2/18:  EF 50-55, GLS -20% (normal), normal wall motion, grade 1 diastolic dysfunction, trivial MR, mild RVE, normal RV SF, trivial TR, PASP 23  . Hyperlipidemia    Elevated, but never taken medication  . Hypertension   . Insomnia 08/24/2015  . Obesity 11/14/2014  . OSA (obstructive sleep apnea) 09/12/2016  . Pain in joint, shoulder region 11/27/2015  . Palpitations   . Post-menopause 08/23/2015  . Raynauds syndrome   . Sleep apnea    appt with pulmonary Dr in march 2018  . Vitamin B12 deficiency 08/23/2015  . Vitamin D deficiency 08/23/2015    Past Surgical History:  Procedure Laterality Date  . ABDOMINAL HYSTERECTOMY  2010  . BREAST BIOPSY     right, 1996  . ECTOPIC PREGNANCY SURGERY    . SHOULDER OPEN ROTATOR CUFF REPAIR  2017   right  . TUBAL LIGATION     on right  . TYMPANOSTOMY TUBE PLACEMENT      Family History  Problem Relation Age of Onset  . Diabetes Mother   . Depression Mother   . Hypertension Mother   . Hyperlipidemia Mother   . Liver disease Mother   . Sleep apnea Mother   . Obesity Mother   . Heart attack Mother 84       CAD w/MI in mid 7s; s/p PCI  . Diabetes Father   . Hypertension Father   . Cancer Maternal Grandmother        throat  . Heart attack Maternal Grandmother   . Cancer Paternal Grandmother        lung  . Heart disease Paternal Grandmother   . Hypertension Sister        as a teenager    Social History   Socioeconomic History  . Marital status: Married    Spouse name: Keisha Earls  . Number of children: 1  . Years of education: 69  . Highest education level: Not on file  Occupational History  . 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[employer]  Tobacco Use  . Smoking status: Never Smoker  . Smokeless tobacco: Never Used  Substance and Sexual Activity  .  Alcohol use: No    Alcohol/week: 0.0 standard drinks  . Drug use: No  . 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 Not on file  Social History Narrative  . Not on file   Social Determinants of Health   Financial Resource Strain:   . Difficulty of Paying Living Expenses: Not on file  Food Insecurity:   . Worried About Charity fundraiser in the Last Year: Not on file  . Ran Out of Food in the Last Year: Not on file  Transportation Needs:   . Lack of Transportation (Medical): Not on file  . Lack of Transportation (Non-Medical): Not on file  Physical Activity:   . Days of Exercise per Week: Not on file  . Minutes of Exercise per Session: Not on file  Stress:    . Feeling of Stress : Not on file  Social Connections:   . Frequency of Communication with Friends and Family: Not on file  . Frequency of Social Gatherings with Friends and Family: Not on file  . Attends Religious Services: Not on file  . Active Member of Clubs or Organizations: Not on file  . Attends Archivist Meetings: Not on file  . Marital Status: Not on file  Intimate Partner Violence:   . Fear of Current or Ex-Partner: Not on file  . Emotionally Abused: Not on file  . Physically Abused: Not on file  . Sexually Abused: Not on file    Outpatient Medications Prior to Visit  Medication Sig Dispense Refill  . acetaminophen (TYLENOL) 500 MG tablet Take 1,000 mg by mouth every 6 (six) hours as needed.    . Enalapril-hydroCHLOROthiazide 5-12.5 MG tablet Take 1 tablet by mouth daily. Please make overdue appt with Dr. Curt Bears before anymore refills. 2nd attempt 30 tablet 1  . LORazepam (ATIVAN) 0.5 MG tablet TAKE ONE TABLET BY MOUTH TWICE A DAY AS NEEDED FOR ANXIETY 30 tablet 1  . DULoxetine (CYMBALTA) 30 MG capsule Take 1 capsule (30 mg total) by mouth 3 (three) times daily. 90 capsule 0  . metoprolol succinate (TOPROL-XL) 25 MG 24 hr tablet Take 0.5 tablets (12.5 mg total) by mouth daily. 90 tablet 3  . metoprolol succinate (TOPROL-XL) 50 MG 24 hr tablet TAKE ONE TABLET BY MOUTH TWO TIMES A DAY 60 tablet 1  . tiZANidine (ZANAFLEX) 4 MG tablet Take 1 tablet (4 mg total) by mouth 2 (two) times daily as needed for muscle spasms. 30 tablet 2  . albuterol (VENTOLIN HFA) 108 (90 Base) MCG/ACT inhaler Inhale 2 puffs into the lungs every 6 (six) hours as needed for wheezing or shortness of breath. 18 g 1  . benzonatate (TESSALON) 100 MG capsule Take 1 capsule (100 mg total) by mouth every 8 (eight) hours. 15 capsule 0  . fluconazole (DIFLUCAN) 150 MG tablet Take 1 tablet (150 mg total) by mouth once a week. Prn yeast 2 tablet 0  . methylPREDNISolone (MEDROL) 4 MG tablet 5 tab po qd X  1d then 4 tab po qd X 1d then 3 tab po qd X 1d then 2 tab po qd then 1 tab po qd 15 tablet 0   No facility-administered medications prior to visit.    Allergies  Allergen Reactions  . Losartan Other (See Comments)    Diarrhea and abodminal cramping  .  Oxycodone Itching    Review of Systems  Constitutional: Positive for malaise/fatigue. Negative for chills and fever.  HENT: Negative for congestion and hearing loss.   Eyes: Negative for discharge.  Respiratory: Negative for cough, sputum production and shortness of breath.   Cardiovascular: Positive for chest pain and palpitations. Negative for leg swelling.  Gastrointestinal: Negative for abdominal pain, blood in stool, constipation, diarrhea, heartburn, nausea and vomiting.  Genitourinary: Positive for frequency and urgency. Negative for dysuria and hematuria.  Musculoskeletal: Negative for back pain, falls and myalgias.  Skin: Negative for rash.  Neurological: Negative for dizziness, sensory change, loss of consciousness, weakness and headaches.  Endo/Heme/Allergies: Negative for environmental allergies. Does not bruise/bleed easily.  Psychiatric/Behavioral: Negative for depression and suicidal ideas. The patient is not nervous/anxious and does not have insomnia.        Objective:    Physical Exam Constitutional:      General: She is not in acute distress.    Appearance: She is not diaphoretic.  HENT:     Head: Normocephalic and atraumatic.     Right Ear: External ear normal.     Left Ear: External ear normal.     Nose: Nose normal.     Mouth/Throat:     Pharynx: No oropharyngeal exudate.  Eyes:     General: No scleral icterus.       Right eye: No discharge.        Left eye: No discharge.     Conjunctiva/sclera: Conjunctivae normal.     Pupils: Pupils are equal, round, and reactive to light.  Neck:     Thyroid: No thyromegaly.  Cardiovascular:     Rate and Rhythm: Normal rate and regular rhythm.     Heart sounds:  Normal heart sounds. No murmur.  Pulmonary:     Effort: Pulmonary effort is normal. No respiratory distress.     Breath sounds: Normal breath sounds. No wheezing or rales.  Abdominal:     General: Bowel sounds are normal. There is no distension.     Palpations: Abdomen is soft. There is no mass.     Tenderness: There is no abdominal tenderness.  Musculoskeletal:        General: No tenderness. Normal range of motion.     Cervical back: Normal range of motion and neck supple.  Lymphadenopathy:     Cervical: No cervical adenopathy.  Skin:    General: Skin is warm and dry.     Findings: No rash.  Neurological:     Mental Status: She is alert and oriented to person, place, and time.     Cranial Nerves: No cranial nerve deficit.     Coordination: Coordination normal.     Deep Tendon Reflexes: Reflexes are normal and symmetric. Reflexes normal.     BP (!) 144/100   Pulse 74   Temp (!) 96.8 F (36 C) (Temporal)   Resp 16   Ht 5\' 3"  (1.6 m)   Wt 238 lb (108 kg)   SpO2 100%   BMI 42.16 kg/m  Wt Readings from Last 3 Encounters:  07/09/19 238 lb (108 kg)  05/29/19 235 lb (106.6 kg)  02/10/19 228 lb (103.4 kg)    Diabetic Foot Exam - Simple   No data filed     Lab Results  Component Value Date   WBC 8.2 07/15/2018   HGB 13.3 07/15/2018   HCT 38.7 07/15/2018   PLT 323.0 07/15/2018   GLUCOSE 71 07/15/2018   CHOL 246 (  H) 07/15/2018   TRIG 128.0 07/15/2018   HDL 78.30 07/15/2018   LDLCALC 142 (H) 07/15/2018   ALT 15 07/15/2018   AST 14 07/15/2018   NA 139 07/15/2018   K 4.3 07/15/2018   CL 99 07/15/2018   CREATININE 0.71 07/15/2018   BUN 17 07/15/2018   CO2 31 07/15/2018   TSH 1.89 07/15/2018   HGBA1C 5.2 07/15/2018    Lab Results  Component Value Date   TSH 1.89 07/15/2018   Lab Results  Component Value Date   WBC 8.2 07/15/2018   HGB 13.3 07/15/2018   HCT 38.7 07/15/2018   MCV 86.1 07/15/2018   PLT 323.0 07/15/2018   Lab Results  Component Value Date     NA 139 07/15/2018   K 4.3 07/15/2018   CO2 31 07/15/2018   GLUCOSE 71 07/15/2018   BUN 17 07/15/2018   CREATININE 0.71 07/15/2018   BILITOT 0.5 07/15/2018   ALKPHOS 59 07/15/2018   AST 14 07/15/2018   ALT 15 07/15/2018   PROT 7.2 07/15/2018   ALBUMIN 4.4 07/15/2018   CALCIUM 9.7 07/15/2018   GFR 88.13 07/15/2018   Lab Results  Component Value Date   CHOL 246 (H) 07/15/2018   Lab Results  Component Value Date   HDL 78.30 07/15/2018   Lab Results  Component Value Date   LDLCALC 142 (H) 07/15/2018   Lab Results  Component Value Date   TRIG 128.0 07/15/2018   Lab Results  Component Value Date   CHOLHDL 3 07/15/2018   Lab Results  Component Value Date   HGBA1C 5.2 07/15/2018       Assessment & Plan:   Problem List Items Addressed This Visit    Hyperlipidemia    Encouraged heart healthy diet, increase exercise, avoid trans fats, consider a krill oil cap daily      Relevant Medications   metoprolol succinate (TOPROL-XL) 50 MG 24 hr tablet   Other Relevant Orders   Lipid panel   Hypertension    Poorly controlled she was actually only taking her Metoprolol XL 50 mg once a day so she agrees to increase to bid use. encouraged DASH diet, minimize caffeine and obtain adequate sleep. Report concerning symptoms and follow up as directed and as needed      Relevant Medications   metoprolol succinate (TOPROL-XL) 50 MG 24 hr tablet   Obesity    Encouraged DASH diet, decrease po intake and increase exercise as tolerated. Needs 7-8 hours of sleep nightly. Avoid trans fats, eat small, frequent meals every 4-5 hours with lean proteins, complex carbs and healthy fats. Minimize simple carbs, consider NOOM or Weight Watchers APP      Vitamin D deficiency    Supplement and montior      Relevant Orders   VITAMIN D 25 Hydroxy (Vit-D Deficiency, Fractures)   Vitamin B12 deficiency    Supplement and monitor      Relevant Orders   Vitamin B12   Preventative health care     Patient encouraged to maintain heart healthy diet, regular exercise, adequate sleep. Consider daily probiotics. Take medications as prescribed. Labs ordered and reviewed. UTD on MGM      Relevant Orders   CBC   Comprehensive metabolic panel   TSH   Urinary incontinence - Primary    She is s/p hysterectomy and is noting worsening urinary incontinence. She will return to GYN for exam and to discuss options.       Relevant Orders  Ambulatory referral to Obstetrics / Gynecology   Palpitations    Has been increased recently and occurring several times a week. Will proceed with repeat Echo and is referred back to cardiology for further consideration.        Other Visit Diagnoses    Palpitation       Relevant Orders   ECHOCARDIOGRAM COMPLETE   Ambulatory referral to Cardiology      I have discontinued Deanie L. Oesterling's tiZANidine, metoprolol succinate, fluconazole, albuterol, methylPREDNISolone, and benzonatate. I have also changed her metoprolol succinate. Additionally, I am having her maintain her LORazepam, Enalapril-hydroCHLOROthiazide, acetaminophen, and DULoxetine.  Meds ordered this encounter  Medications  . DULoxetine (CYMBALTA) 30 MG capsule    Sig: Take 1 capsule (30 mg total) by mouth 3 (three) times daily.    Dispense:  270 capsule    Refill:  1  . metoprolol succinate (TOPROL-XL) 50 MG 24 hr tablet    Sig: Take 1 tablet (50 mg total) by mouth 2 (two) times daily. Take with or immediately following a meal.    Dispense:  180 tablet    Refill:  1     Penni Homans, MD

## 2019-07-09 NOTE — Assessment & Plan Note (Signed)
Patient encouraged to maintain heart healthy diet, regular exercise, adequate sleep. Consider daily probiotics. Take medications as prescribed. Labs ordered and reviewed. UTD on MGM

## 2019-07-09 NOTE — Assessment & Plan Note (Signed)
Has been increased recently and occurring several times a week. Will proceed with repeat Echo and is referred back to cardiology for further consideration.

## 2019-07-09 NOTE — Assessment & Plan Note (Signed)
She is s/p hysterectomy and is noting worsening urinary incontinence. She will return to GYN for exam and to discuss options.

## 2019-07-10 LAB — COMPREHENSIVE METABOLIC PANEL
ALT: 25 U/L (ref 0–35)
AST: 19 U/L (ref 0–37)
Albumin: 4.1 g/dL (ref 3.5–5.2)
Alkaline Phosphatase: 52 U/L (ref 39–117)
BUN: 15 mg/dL (ref 6–23)
CO2: 29 mEq/L (ref 19–32)
Calcium: 9.5 mg/dL (ref 8.4–10.5)
Chloride: 104 mEq/L (ref 96–112)
Creatinine, Ser: 0.62 mg/dL (ref 0.40–1.20)
GFR: 102.63 mL/min (ref >60.00)
Glucose, Bld: 91 mg/dL (ref 70–99)
Potassium: 4.1 mEq/L (ref 3.5–5.1)
Sodium: 141 mEq/L (ref 135–145)
Total Bilirubin: 0.3 mg/dL (ref 0.2–1.2)
Total Protein: 6.9 g/dL (ref 6.0–8.3)

## 2019-07-10 LAB — CBC
HCT: 36.5 % (ref 36.0–46.0)
Hemoglobin: 12.1 g/dL (ref 12.0–15.0)
MCHC: 33.2 g/dL (ref 30.0–36.0)
MCV: 84.4 fl (ref 78.0–100.0)
Platelets: 290 10*3/uL (ref 150.0–400.0)
RBC: 4.32 Mil/uL (ref 3.87–5.11)
RDW: 14.2 % (ref 11.5–15.5)
WBC: 7.2 10*3/uL (ref 4.0–10.5)

## 2019-07-10 LAB — LIPID PANEL
Cholesterol: 211 mg/dL — ABNORMAL HIGH (ref 0–200)
HDL: 71.4 mg/dL (ref >39.00)
LDL Cholesterol: 118 mg/dL — ABNORMAL HIGH (ref 0–99)
NonHDL: 140.08
Total CHOL/HDL Ratio: 3
Triglycerides: 110 mg/dL (ref 0.0–149.0)
VLDL: 22 mg/dL (ref 0.0–40.0)

## 2019-07-10 LAB — VITAMIN B12: Vitamin B-12: 266 pg/mL (ref 211–911)

## 2019-07-10 LAB — VITAMIN D 25 HYDROXY (VIT D DEFICIENCY, FRACTURES): VITD: 15.27 ng/mL — ABNORMAL LOW (ref 30.00–100.00)

## 2019-07-10 LAB — TSH: TSH: 2.85 u[IU]/mL (ref 0.35–4.50)

## 2019-07-13 ENCOUNTER — Telehealth: Payer: Federal, State, Local not specified - PPO | Admitting: Family

## 2019-07-13 ENCOUNTER — Other Ambulatory Visit: Payer: Self-pay

## 2019-07-13 DIAGNOSIS — R399 Unspecified symptoms and signs involving the genitourinary system: Secondary | ICD-10-CM

## 2019-07-13 MED ORDER — VITAMIN D (ERGOCALCIFEROL) 1.25 MG (50000 UNIT) PO CAPS: 50000.0000 [IU] | ORAL_CAPSULE | ORAL | 0 refills | Status: DC

## 2019-07-13 MED ORDER — CEPHALEXIN 500 MG PO CAPS: 500.0000 mg | ORAL_CAPSULE | Freq: Two times a day (BID) | ORAL | 0 refills | Status: DC

## 2019-07-13 MED ORDER — VITAMIN D (ERGOCALCIFEROL) 1.25 MG (50000 UNIT) PO CAPS: 50000.0000 [IU] | ORAL_CAPSULE | ORAL | 4 refills | Status: AC

## 2019-07-13 NOTE — Progress Notes (Signed)

## 2019-07-14 ENCOUNTER — Other Ambulatory Visit: Payer: Self-pay

## 2019-07-14 ENCOUNTER — Encounter: Payer: Self-pay | Admitting: Physician Assistant

## 2019-07-14 ENCOUNTER — Ambulatory Visit: Payer: Federal, State, Local not specified - PPO | Admitting: Family Medicine

## 2019-07-14 VITALS — BP 136/86 | HR 72 | Ht 63.0 in | Wt 237.0 lb

## 2019-07-14 DIAGNOSIS — G4733 Obstructive sleep apnea (adult) (pediatric): Secondary | ICD-10-CM | POA: Diagnosis not present

## 2019-07-14 DIAGNOSIS — E782 Mixed hyperlipidemia: Secondary | ICD-10-CM | POA: Diagnosis not present

## 2019-07-14 DIAGNOSIS — I10 Essential (primary) hypertension: Secondary | ICD-10-CM

## 2019-07-14 MED ORDER — ATORVASTATIN CALCIUM 10 MG PO TABS: 10.0000 mg | ORAL_TABLET | Freq: Every day | ORAL | 3 refills | Status: DC

## 2019-07-14 NOTE — Progress Notes (Signed)
Cardiology Office Note  Date: 07/14/2019   ID: Julie Bishop, DOB January 28, 1971, MRN [id number]  PCP:  [name], MD  Cardiologist:  Constance Haw, MD Electrophysiologist:  None   Chief Complaint  Patient presents with  . Follow-up    HTN, HLD    History of Present Illness: Julie Bishop is a 48 y.o. female with a history of HTN, HLD OSA.  Here for follow-up related to history of hypertension, hyperlipidemia, obstructive sleep apnea.  Patient recently saw her primary care provider for annual checkup.  PCP noticed that her blood pressure was elevated and patient was only taking Toprol-XL 50 mg daily.  PCP increase Toprol-XL to 50 mg p.o. twice daily. PCP also ordered a follow-up echocardiogram due to patient's history of diastolic dysfunction, mitral and tricuspid regurgitation and recent history of increased dyspnea on exertion.  Patient states she attributes some of dyspnea on exertion to deconditioning and excess weight.  Patient has a history of obesity and states she is going to try lifestyle changes to lose weight, by improving dietary habits, exercising more.  States she recently had a conversation with her PCP regarding the same issues.  History of hyperlipidemia with elevated LDLs over the last 3 years.  Most recent LDL was 118 and total cholesterol 211. ASCVD Risk Calculation 1.3% 10 yr risk for cardiovascular event. Family hx of Hyperlipidemia in her mother. Diabetes in her father.  Past Medical History:  Diagnosis Date  . Adjustment disorder 01/25/2017  . Anemia   . Anxiety and depression   . Back pain   . Cancer ([place]) 2008   skin cancer, BCC  . Constipation 11/14/2014  . Depression   . Generalized anxiety disorder   . GERD (gastroesophageal reflux disease)   . Headache 08/24/2015  . History of cardiovascular stress test    GXT 3/19: No ischemic ECG changes  . History of echocardiogram    Echo 2/18:  EF 50-55, GLS -20% (normal), normal  wall motion, grade 1 diastolic dysfunction, trivial MR, mild RVE, normal RV SF, trivial TR, PASP 23  . Hyperlipidemia    Elevated, but never taken medication  . Hypertension   . Insomnia 08/24/2015  . Obesity 11/14/2014  . OSA (obstructive sleep apnea) 09/12/2016  . Pain in joint, shoulder region 11/27/2015  . Palpitations   . Post-menopause 08/23/2015  . Raynauds syndrome   . Sleep apnea    appt with pulmonary Dr in march 2018  . Vitamin B12 deficiency 08/23/2015  . Vitamin D deficiency 08/23/2015    Past Surgical History:  Procedure Laterality Date  . ABDOMINAL HYSTERECTOMY  2010  . BREAST BIOPSY     right, 1996  . ECTOPIC PREGNANCY SURGERY    . SHOULDER OPEN ROTATOR CUFF REPAIR  2017   right  . TUBAL LIGATION     on right  . TYMPANOSTOMY TUBE PLACEMENT      Current Outpatient Medications  Medication Sig Dispense Refill  . acetaminophen (TYLENOL) 500 MG tablet Take 1,000 mg by mouth every 6 (six) hours as needed.    . cephALEXin (KEFLEX) 500 MG capsule Take 1 capsule (500 mg total) by mouth 2 (two) times daily. 14 capsule 0  . DULoxetine (CYMBALTA) 30 MG capsule Take 1 capsule (30 mg total) by mouth 3 (three) times daily. 270 capsule 1  . Enalapril-hydroCHLOROthiazide 5-12.5 MG tablet Take 1 tablet by mouth daily. Please make overdue appt with Dr. Curt Bears before anymore refills. 2nd attempt 30 tablet  1  . LORazepam (ATIVAN) 0.5 MG tablet TAKE ONE TABLET BY MOUTH TWICE A DAY AS NEEDED FOR ANXIETY 30 tablet 1  . metoprolol succinate (TOPROL-XL) 50 MG 24 hr tablet Take 1 tablet (50 mg total) by mouth 2 (two) times daily. Take with or immediately following a meal. 180 tablet 1  . Vitamin D, Ergocalciferol, (DRISDOL) 1.25 MG (50000 UNIT) CAPS capsule Take 1 capsule (50,000 Units total) by mouth every 7 (seven) days. 12 capsule 4   No current facility-administered medications for this visit.   Allergies:  Losartan and Oxycodone   Social History: The patient  reports that she has never  smoked. She has never used smokeless tobacco. She reports that she does not drink alcohol or use drugs.   Family History: The patient's family history includes Cancer in her maternal grandmother and paternal grandmother; Depression in her mother; Diabetes in her father and mother; Heart attack in her maternal grandmother; Heart attack (age of onset: 23) in her mother; Heart disease in her paternal grandmother; Hyperlipidemia in her mother; Hypertension in her father, mother, and sister; Liver disease in her mother; Obesity in her mother; Sleep apnea in her mother.   ROS:  Please see the history of present illness. Otherwise, complete review of systems is positive for none.  All other systems are reviewed and negative.   Physical Exam: VS:  Ht 5\' 3"  (1.6 m)   Wt 237 lb (107.5 kg)   SpO2 97%   BMI 41.98 kg/m , BMI Body mass index is 41.98 kg/m.  Wt Readings from Last 3 Encounters:  07/14/19 237 lb (107.5 kg)  07/09/19 238 lb (108 kg)  05/29/19 235 lb (106.6 kg)    General: Obese female appears comfortable at rest. Neck: Supple, no elevated JVP or carotid bruits, no thyromegaly. Lungs: Clear to auscultation, nonlabored breathing at rest. Cardiac: Regular rate and rhythm, no S3 or significant systolic murmur, no pericardial rub. Extremities: No pitting edema, distal pulses 2+. Skin: Warm and dry. Musculoskeletal: No kyphosis.  Neuropsychiatric: Alert and oriented x3, affect grossly appropriate.  ECG:  An ECG dated 07/14/2019 was personally reviewed today and demonstrated:  Sinus rhythm rate of 72.  No acute ST or T wave abnormalities.  Normal axis.  No hypertrophy  Recent Labwork: 07/09/2019: ALT 25; AST 19; BUN 15; Creatinine, Ser 0.62; Hemoglobin 12.1; Platelets 290.0; Potassium 4.1; Sodium 141; TSH 2.85     Component Value Date/Time   CHOL 211 (H) 07/09/2019 1505   CHOL 226 (H) 07/12/2016 1058   TRIG 110.0 07/09/2019 1505   HDL 71.40 07/09/2019 1505   HDL 73 07/12/2016 1058    CHOLHDL 3 07/09/2019 1505   VLDL 22.0 07/09/2019 1505   LDLCALC 118 (H) 07/09/2019 1505   LDLCALC 134 (H) 07/12/2016 1058    Other Studies Reviewed Today:  GXT 08/01/17  There was no ST segment deviation noted during stress.  Clinically and electrically negative for ischemia.  Blood pressure demonstrated a normal response to exercise.  Echo 07/04/16 EF 50-55, GLS -20% (normal), normal wall motion, grade 1 diastolic dysfunction, trivial MR, mild RVE, normal RV SF, trivial TR, PASP 23   Assessment and Plan:  1. Essential hypertension   2. Mixed hyperlipidemia   3. OSA (obstructive sleep apnea)    1. Essential hypertension She recently visited her PCP for UTI. BP was elevated. Patient was only taking Toprol 50 mg once a day. Toprol was increased to 50 mg po BID.Marland Kitchen  Echocardiogram recently ordered by PCP.  Not performed yet. BP initially elevated at 142 /90 . Recheck in L Arm 136/86. Continue Toprol XL 50 mg po BID. Will await echo results from PCP.   2. Mixed hyperlipidemia Recent lipid profile 07/09/2019; Tot Chol 211, Trig 110, HDL 71, LDL 118. Not on Statin medication. Start atorvastatin 10 mg po daily. Get FLP and LFT 6 to 8 weeks.  3. OSA (obstructive sleep apnea) diagnosed 09/06/2016 sleep study  On CPAP and compliant.  Patient states she has not seen her pulmonologist in quite some time.  Advised patient she might need to follow-up with her pulmonologist for possible CPAP titration.  4. Palpitations Patient states she had 2 minor episodes of brief palpitations the last month.  States she was not symptomatic she just noticed brief episode of skipped beats.  She denies any dizziness, syncopal episodes.  States these palpitations occurred while she was sitting and an active.  Denies any previous episodes of palpitations or subsequent episode.  Advised patient if palpitations persist or last longer, or associated with feelings of dizziness, near syncope, chest pain, or shortness of  breath to call us.  This may require a long-term monitor if palpitations become more persistent or symptomatic.  EKG today shows normal sinus rhythm with a rate of 72.  No ectopy noted.  No acute ST or T wave abnormalities.  Normal axis, no hypertrophy.   Medication A: 01.  Scott is 51 PCI note no she has no arrhythmias other than she had a couple of palpitations afor primary carebs and Tests Ordered: Current medicines are reviewed at length with the patient today.  Concerns regarding medicines are outlined above.     Signed, Levell July, NP 07/14/2019 2:27 PM    [facility]

## 2019-07-14 NOTE — Patient Instructions (Signed)
Medication Instructions:   Your physician has recommended you make the following change in your medication:   1) Start Atorvastatin 10 mg, 1 tablet by mouth once a day  *If you need a refill on your cardiac medications before your next appointment, please call your pharmacy*  Lab Work:  Your physician recommends that you return for lab work on 09/08/19 at 8:30AM  If you have labs (blood work) drawn today and your tests are completely normal, you will receive your results only by: Marland Kitchen MyChart Message (if you have MyChart) OR . A paper copy in the mail If you have any lab test that is abnormal or we need to change your treatment, we will call you to review the results.  Testing/Procedures:  None ordered today  Follow-Up: At [facility], you and your health needs are our priority.  As part of our continuing mission to provide you with exceptional heart care, we have created designated Provider Care Teams.  These Care Teams include your primary Cardiologist (physician) and Advanced Practice Providers (APPs -  Physician Assistants and Nurse Practitioners) who all work together to provide you with the care you need, when you need it.  Your next appointment:   12 month(s)  The format for your next appointment:   In Person  Provider:   Richardson Dopp, PA-C

## 2019-07-14 NOTE — Progress Notes (Deleted)
Cardiology Office Note:    Date:  07/14/2019   ID:  PHILECIA Bishop, DOB 1970-07-07, MRN [id number]  PCP:  [name], MD  Cardiologist:  Constance Haw, MD *** Electrophysiologist:  None   Referring MD: [name], MD   Chief Complaint:  No chief complaint on file.    Patient Profile:    Julie Bishop is a 49 y.o. female with:   Hypertension  Hyperlipidemia  Sleep apnea  Prior CV studies: *** GXT 08/01/17  There was no ST segment deviation noted during stress.  Clinically and electrically negative for ischemia.  Blood pressure demonstrated a normal response to exercise.  Echo 07/04/16 EF 50-55, GLS -20% (normal), normal wall motion, grade 1 diastolic dysfunction, trivial MR, mild RVE, normal RV SF, trivial TR, PASP 23   History of Present Illness:    Julie Bishop was last seen in clinic in May 2019.   The DICTATELATER SmartLink is not supported in this context. ***   Past Medical History:  Diagnosis Date  . Adjustment disorder 01/25/2017  . Anemia   . Anxiety and depression   . Back pain   . Cancer ([place]) 2008   skin cancer, BCC  . Constipation 11/14/2014  . Depression   . Generalized anxiety disorder   . GERD (gastroesophageal reflux disease)   . Headache 08/24/2015  . History of cardiovascular stress test    GXT 3/19: No ischemic ECG changes  . History of echocardiogram    Echo 2/18:  EF 50-55, GLS -20% (normal), normal wall motion, grade 1 diastolic dysfunction, trivial MR, mild RVE, normal RV SF, trivial TR, PASP 23  . Hyperlipidemia    Elevated, but never taken medication  . Hypertension   . Insomnia 08/24/2015  . Obesity 11/14/2014  . OSA (obstructive sleep apnea) 09/12/2016  . Pain in joint, shoulder region 11/27/2015  . Palpitations   . Post-menopause 08/23/2015  . Raynauds syndrome   . Sleep apnea    appt with pulmonary Dr in march 2018  . Vitamin B12 deficiency 08/23/2015  . Vitamin D deficiency 08/23/2015    Current  Medications: No outpatient medications have been marked as taking for the 07/14/19 encounter (Appointment) with Richardson Dopp T, PA-C.     Allergies:   Losartan and Oxycodone   Social History   Tobacco Use  . Smoking status: Never Smoker  . Smokeless tobacco: Never Used  Substance Use Topics  . Alcohol use: No    Alcohol/week: 0.0 standard drinks  . Drug use: No     Family Hx: The patient's family history includes Cancer in her maternal grandmother and paternal grandmother; Depression in her mother; Diabetes in her father and mother; Heart attack in her maternal grandmother; Heart attack (age of onset: 65) in her mother; Heart disease in her paternal grandmother; Hyperlipidemia in her mother; Hypertension in her father, mother, and sister; Liver disease in her mother; Obesity in her mother; Sleep apnea in her mother.  ROS   EKGs/Labs/Other Test Reviewed:    EKG:  EKG is *** ordered today.  The ekg ordered today demonstrates ***  Recent Labs: 07/09/2019: ALT 25; BUN 15; Creatinine, Ser 0.62; Hemoglobin 12.1; Platelets 290.0; Potassium 4.1; Sodium 141; TSH 2.85   Recent Lipid Panel Lab Results  Component Value Date/Time   CHOL 211 (H) 07/09/2019 03:05 PM   CHOL 226 (H) 07/12/2016 10:58 AM   TRIG 110.0 07/09/2019 03:05 PM   HDL 71.40 07/09/2019 03:05 PM   HDL  73 07/12/2016 10:58 AM   CHOLHDL 3 07/09/2019 03:05 PM   LDLCALC 118 (H) 07/09/2019 03:05 PM   LDLCALC 134 (H) 07/12/2016 10:58 AM    Physical Exam:    VS:  There were no vitals taken for this visit.    Wt Readings from Last 3 Encounters:  07/09/19 238 lb (108 kg)  05/29/19 235 lb (106.6 kg)  02/10/19 228 lb (103.4 kg)     Physical Exam ***  ASSESSMENT & PLAN:    *** Essential hypertension  She brings in a list of her blood pressures.  Her systolic pressure is at goal.  Her diastolic pressure has been in the low to mid 80s.  She has had a couple of readings of 90 and 95.  She has been working on weight loss  and has lost 9 pounds since her last visit.  She feels much better.  At this point, I have recommended continuing her current medical therapy.  With continued weight loss, her pressure should become more optimal.  If her diastolic remains above target, I would recommend adding HCTZ 12.5 mg to her current regimen.  Follow-up BMET today.  Follow-up in 6 months. Dispo:  No follow-ups on file.   Medication Adjustments/Labs and Tests Ordered: Current medicines are reviewed at length with the patient today.  Concerns regarding medicines are outlined above.  Tests Ordered: No orders of the defined types were placed in this encounter.  Medication Changes: No orders of the defined types were placed in this encounter.   Signed, Richardson Dopp, PA-C  07/14/2019 7:52 AM    [facility] [address], [place], [place]  [address] Phone: [phone]; Fax: [phone]

## 2019-07-22 ENCOUNTER — Other Ambulatory Visit: Payer: Self-pay

## 2019-07-22 ENCOUNTER — Ambulatory Visit (HOSPITAL_BASED_OUTPATIENT_CLINIC_OR_DEPARTMENT_OTHER)
Admission: RE | Admit: 2019-07-22 | Discharge: 2019-07-22 | Disposition: A | Discharge status: Home / Self Care | Payer: Federal, State, Local not specified - PPO | Source: Ambulatory Visit | Attending: Family Medicine | Admitting: Family Medicine

## 2019-07-22 DIAGNOSIS — R002 Palpitations: Secondary | ICD-10-CM | POA: Diagnosis not present

## 2019-07-22 NOTE — Progress Notes (Signed)
  Echocardiogram 2D Echocardiogram has been performed.  Julie Bishop 07/22/2019, 3:22 PM

## 2019-07-24 ENCOUNTER — Encounter: Payer: Self-pay | Admitting: Family Medicine

## 2019-07-31 ENCOUNTER — Telehealth: Payer: Federal, State, Local not specified - PPO | Admitting: Nurse Practitioner

## 2019-07-31 DIAGNOSIS — N390 Urinary tract infection, site not specified: Secondary | ICD-10-CM

## 2019-07-31 NOTE — Progress Notes (Signed)
Based on what you shared with me it looks like you have urinary issue ,that should be evaluated in a face to face office visit. Because you were just treated for UTI on 07/13/19 you need a face to face visit so that a urine culture can be done for proper treatment.    NOTE: If you entered your credit card information for this eVisit, you will not be charged. You may see a "hold" on your card for the $35 but that hold will drop off and you will not have a charge processed.  If you are having a true medical emergency please call 911.     For an urgent face to face visit, [facility] has four urgent care centers for your convenience:   . [facility]    [phone]                  Get Driving Directions  [id number] [place], [place] [address] . 10 am to 8 pm Monday-Friday . 12 pm to 8 pm Saturday-Sunday   . [facility] [place]                  Get Driving Directions  [id number] [place], [place], [place] [address] . 8 am to 8 pm Monday-Friday . 9 am to 6 pm Saturday . 11 am to 6 pm Sunday   . [facility] at [place]                  Get Driving Directions   [address], [place] [address] . 8 am to 8 pm Monday-Friday . 8 am to 4 pm Saturday-Sunday    . [facility] [place]                    Get Driving Directions  [id number]  [address], [place], [place] [address]  . Monday-Friday, 12 PM to 6 PM    Your e-visit answers were reviewed by a board certified advanced clinical practitioner to complete your personal care plan.  Thank you for using e-Visits.

## 2019-08-03 ENCOUNTER — Other Ambulatory Visit: Payer: Self-pay | Admitting: Family Medicine

## 2019-09-01 ENCOUNTER — Other Ambulatory Visit: Payer: Self-pay

## 2019-09-01 ENCOUNTER — Ambulatory Visit: Payer: Federal, State, Local not specified - PPO | Admitting: Family Medicine

## 2019-09-01 VITALS — BP 118/72 | HR 65 | Temp 97.9°F | Resp 12 | Ht 63.0 in | Wt 225.0 lb

## 2019-09-01 DIAGNOSIS — F419 Anxiety disorder, unspecified: Secondary | ICD-10-CM

## 2019-09-01 DIAGNOSIS — I10 Essential (primary) hypertension: Secondary | ICD-10-CM

## 2019-09-01 DIAGNOSIS — E559 Vitamin D deficiency, unspecified: Secondary | ICD-10-CM | POA: Diagnosis not present

## 2019-09-01 DIAGNOSIS — E782 Mixed hyperlipidemia: Secondary | ICD-10-CM | POA: Diagnosis not present

## 2019-09-01 DIAGNOSIS — F329 Major depressive disorder, single episode, unspecified: Secondary | ICD-10-CM

## 2019-09-01 DIAGNOSIS — F32A Depression, unspecified: Secondary | ICD-10-CM

## 2019-09-01 DIAGNOSIS — E538 Deficiency of other specified B group vitamins: Secondary | ICD-10-CM | POA: Diagnosis not present

## 2019-09-01 DIAGNOSIS — R739 Hyperglycemia, unspecified: Secondary | ICD-10-CM

## 2019-09-01 MED ORDER — ENALAPRIL-HYDROCHLOROTHIAZIDE 5-12.5 MG PO TABS: ORAL_TABLET | ORAL | 1 refills | Status: DC

## 2019-09-01 NOTE — Patient Instructions (Signed)
Omron Blood Pressure cuff, upper arm, want BP 100-140/60-90 Pulse oximeter, want oxygen in 90s  Weekly vitals  Take Multivitamin with minerals, selenium Vitamin D 1000-2000 IU daily Probiotic with lactobacillus and bifidophilus Asprin EC 81 mg daily Fish or Krill oil daily Melatonin 2-5 mg at bedtime  [email or web address]

## 2019-09-02 NOTE — Assessment & Plan Note (Signed)
Well controlled, no changes to meds. Encouraged heart healthy diet such as the DASH diet and exercise as tolerated.  °

## 2019-09-02 NOTE — Assessment & Plan Note (Signed)
Supplement and monitor 

## 2019-09-02 NOTE — Progress Notes (Signed)
Subjective:    Patient ID: Julie Bishop, female    DOB: 28-Jun-1970, 49 y.o.   MRN: [id number]  Chief Complaint  Patient presents with  . Hypertension    HPI Patient is in today for follow up on chronic medical concerns. No recent febrile illness or hospitalization.s no c/o polyuria or polydipsia. She has been trying to stay active and maintain a heart healthy diet. Denies CP/palp/SOB/HA/congestion/fevers/GI or GU c/o. Taking meds as prescribed. She has had the [place] vaccine.  Past Medical History:  Diagnosis Date  . Adjustment disorder 01/25/2017  . Anemia   . Anxiety and depression   . Back pain   . Cancer ([place]) 2008   skin cancer, BCC  . Constipation 11/14/2014  . Depression   . Generalized anxiety disorder   . GERD (gastroesophageal reflux disease)   . Headache 08/24/2015  . History of cardiovascular stress test    GXT 3/19: No ischemic ECG changes  . History of echocardiogram    Echo 2/18:  EF 50-55, GLS -20% (normal), normal wall motion, grade 1 diastolic dysfunction, trivial MR, mild RVE, normal RV SF, trivial TR, PASP 23  . Hyperlipidemia    Elevated, but never taken medication  . Hypertension   . Insomnia 08/24/2015  . Obesity 11/14/2014  . OSA (obstructive sleep apnea) 09/12/2016  . Pain in joint, shoulder region 11/27/2015  . Palpitations   . Post-menopause 08/23/2015  . Raynauds syndrome   . Sleep apnea    appt with pulmonary Dr in march 2018  . Vitamin B12 deficiency 08/23/2015  . Vitamin D deficiency 08/23/2015    Past Surgical History:  Procedure Laterality Date  . ABDOMINAL HYSTERECTOMY  2010  . BREAST BIOPSY     right, 1996  . ECTOPIC PREGNANCY SURGERY    . SHOULDER OPEN ROTATOR CUFF REPAIR  2017   right  . TUBAL LIGATION     on right  . TYMPANOSTOMY TUBE PLACEMENT      Family History  Problem Relation Age of Onset  . Diabetes Mother   . Depression Mother   . Hypertension Mother   . Hyperlipidemia Mother   . Liver disease Mother   .  Sleep apnea Mother   . Obesity Mother   . Heart attack Mother 40       CAD w/MI in mid 81s; s/p PCI  . Diabetes Father   . Hypertension Father   . Cancer Maternal Grandmother        throat  . Heart attack Maternal Grandmother   . Cancer Paternal Grandmother        lung  . Heart disease Paternal Grandmother   . Hypertension Sister        as a teenager    Social History   Socioeconomic History  . Marital status: Married    Spouse name: Yoeli Matheney  . Number of children: 1  . Years of education: 21  . Highest education level: Not on file  Occupational History  . 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[employer]  Tobacco Use  . Smoking status: Never Smoker  . Smokeless tobacco: Never Used  Substance and Sexual Activity  . Alcohol use: No    Alcohol/week: 0.0 standard drinks  . Drug use: No  . 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 Not on file  Social History Narrative  . Not on file   Social Determinants of Health   Financial Resource Strain:   . Difficulty of Paying Living Expenses:   Food Insecurity:   . Worried About Charity fundraiser in the Last Year:   . Arboriculturist in the Last Year:   Transportation Needs:   . Film/video editor (Medical):   Marland Kitchen Lack of Transportation (Non-Medical):   Physical Activity:   . Days of Exercise per Week:   . Minutes of Exercise per Session:   Stress:   . Feeling of Stress :   Social Connections:   . Frequency of Communication with Friends and Family:   . Frequency of Social Gatherings with Friends and Family:   . Attends Religious Services:   . Active Member of Clubs or Organizations:   . Attends Archivist Meetings:   Marland Kitchen Marital Status:   Intimate Partner Violence:   . Fear of Current or Ex-Partner:   . Emotionally Abused:   Marland Kitchen Physically  Abused:   . Sexually Abused:       Allergies  Allergen Reactions  . Losartan Other (See Comments)    Diarrhea and abodminal cramping  . Oxycodone Itching    Review of Systems  Constitutional: Negative for fever and malaise/fatigue.  HENT: Negative for congestion.   Eyes: Negative for blurred vision.  Respiratory: Negative for shortness of breath.   Cardiovascular: Negative for chest pain, palpitations and leg swelling.  Gastrointestinal: Negative for abdominal pain, blood in stool and nausea.  Genitourinary: Negative for dysuria and frequency.  Musculoskeletal: Negative for falls.  Skin: Negative for rash.  Neurological: Negative for dizziness, loss of consciousness and headaches.  Endo/Heme/Allergies: Negative for environmental allergies.  Psychiatric/Behavioral: Negative for depression. The patient is not nervous/anxious.        Objective:    Physical Exam Vitals and nursing note reviewed.  Constitutional:      General: She is not in acute distress.    Appearance: She is well-developed.  HENT:     Head: Normocephalic and atraumatic.     Nose: Nose normal.  Eyes:     General:        Right eye: No discharge.        Left eye: No discharge.  Cardiovascular:     Rate and Rhythm: Normal rate and regular rhythm.     Heart sounds: No murmur.  Pulmonary:     Effort: Pulmonary effort is normal.     Breath sounds: Normal breath sounds.  Abdominal:     General: Bowel sounds are normal.     Palpations: Abdomen is soft.     Tenderness: There is no abdominal tenderness.  Musculoskeletal:     Cervical back: Normal range of motion and neck supple.  Skin:    General: Skin is warm and dry.  Neurological:     Mental Status: She is alert and oriented to person, place, and time.     BP 118/72 (BP Location: Right Arm, Cuff Size: Large)   Pulse 65   Temp 97.9 F (36.6 C) (Temporal)   Resp 12   Ht 5\' 3"  (1.6 m)   Wt 225 lb (102.1 kg)   SpO2 98%   BMI 39.86 kg/m  Wt  Readings from Last 3 Encounters:  09/01/19 225 lb (102.1 kg)  07/14/19 237 lb (107.5 kg)  07/09/19 238 lb (108 kg)    Diabetic Foot Exam - Simple   No data filed  Lab Results  Component Value Date   WBC 7.2 07/09/2019   HGB 12.1 07/09/2019   HCT 36.5 07/09/2019   PLT 290.0 07/09/2019   GLUCOSE 91 07/09/2019   CHOL 211 (H) 07/09/2019   TRIG 110.0 07/09/2019   HDL 71.40 07/09/2019   LDLCALC 118 (H) 07/09/2019   ALT 25 07/09/2019   AST 19 07/09/2019   NA 141 07/09/2019   K 4.1 07/09/2019   CL 104 07/09/2019   CREATININE 0.62 07/09/2019   BUN 15 07/09/2019   CO2 29 07/09/2019   TSH 2.85 07/09/2019   HGBA1C 5.2 07/15/2018    Lab Results  Component Value Date   TSH 2.85 07/09/2019   Lab Results  Component Value Date   WBC 7.2 07/09/2019   HGB 12.1 07/09/2019   HCT 36.5 07/09/2019   MCV 84.4 07/09/2019   PLT 290.0 07/09/2019   Lab Results  Component Value Date   NA 141 07/09/2019   K 4.1 07/09/2019   CO2 29 07/09/2019   GLUCOSE 91 07/09/2019   BUN 15 07/09/2019   CREATININE 0.62 07/09/2019   BILITOT 0.3 07/09/2019   ALKPHOS 52 07/09/2019   AST 19 07/09/2019   ALT 25 07/09/2019   PROT 6.9 07/09/2019   ALBUMIN 4.1 07/09/2019   CALCIUM 9.5 07/09/2019   GFR 102.63 07/09/2019   Lab Results  Component Value Date   CHOL 211 (H) 07/09/2019   Lab Results  Component Value Date   HDL 71.40 07/09/2019   Lab Results  Component Value Date   LDLCALC 118 (H) 07/09/2019   Lab Results  Component Value Date   TRIG 110.0 07/09/2019   Lab Results  Component Value Date   CHOLHDL 3 07/09/2019   Lab Results  Component Value Date   HGBA1C 5.2 07/15/2018       Assessment & Plan:   Problem List Items Addressed This Visit    Anxiety and depression    Doing well on current meds. No changes       Hyperlipidemia - Primary    Encouraged heart healthy diet, increase exercise, avoid trans fats, consider a krill oil cap daily      Relevant Medications    Enalapril-hydroCHLOROthiazide 5-12.5 MG tablet   Other Relevant Orders   Lipid panel   Hypertension    Well controlled, no changes to meds. Encouraged heart healthy diet such as the DASH diet and exercise as tolerated.       Relevant Medications   Enalapril-hydroCHLOROthiazide 5-12.5 MG tablet   Other Relevant Orders   Comprehensive metabolic panel   CBC   TSH   Vitamin D deficiency    Supplement and monitor      Relevant Orders   VITAMIN D 25 Hydroxy (Vit-D Deficiency, Fractures)   Vitamin B12 deficiency    Supplement and monitor      Relevant Orders   Vitamin B12   Hyperglycemia    hgba1c acceptable, minimize simple carbs. Increase exercise as tolerated.         I have discontinued Charlcie L. Mcclees's cephALEXin, melatonin, and Centrum Adults. I have also changed her Enalapril-hydroCHLOROthiazide. Additionally, I am having her maintain her LORazepam, acetaminophen, DULoxetine, metoprolol succinate, Vitamin D (Ergocalciferol), aspirin EC, cholecalciferol, and atorvastatin.  Meds ordered this encounter  Medications  . Enalapril-hydroCHLOROthiazide 5-12.5 MG tablet    Sig: TAKE ONE TABLET BY MOUTH DAILY    Dispense:  90 tablet    Refill:  1     Penni Homans, MD

## 2019-09-02 NOTE — Assessment & Plan Note (Signed)
Doing well on current meds. No changes  

## 2019-09-02 NOTE — Assessment & Plan Note (Signed)
hgba1c acceptable, minimize simple carbs. Increase exercise as tolerated.  

## 2019-09-02 NOTE — Assessment & Plan Note (Signed)
Encouraged heart healthy diet, increase exercise, avoid trans fats, consider a krill oil cap daily 

## 2019-09-08 ENCOUNTER — Other Ambulatory Visit: Payer: Federal, State, Local not specified - PPO

## 2019-09-09 ENCOUNTER — Other Ambulatory Visit: Payer: Self-pay

## 2019-09-09 ENCOUNTER — Encounter: Payer: Self-pay | Admitting: Obstetrics & Gynecology

## 2019-09-09 ENCOUNTER — Ambulatory Visit (INDEPENDENT_AMBULATORY_CARE_PROVIDER_SITE_OTHER): Payer: Federal, State, Local not specified - PPO | Admitting: Obstetrics & Gynecology

## 2019-09-09 VITALS — BP 122/64 | HR 67 | Ht 63.0 in | Wt 220.0 lb

## 2019-09-09 DIAGNOSIS — M25552 Pain in left hip: Secondary | ICD-10-CM

## 2019-09-09 DIAGNOSIS — Z01419 Encounter for gynecological examination (general) (routine) without abnormal findings: Secondary | ICD-10-CM | POA: Diagnosis not present

## 2019-09-09 DIAGNOSIS — N393 Stress incontinence (female) (male): Secondary | ICD-10-CM | POA: Diagnosis not present

## 2019-09-09 LAB — POCT URINALYSIS DIPSTICK
Blood, UA: NEGATIVE
Glucose, UA: NEGATIVE
Leukocytes, UA: NEGATIVE
Spec Grav, UA: 1.01 (ref 1.010–1.025)
pH, UA: 6 (ref 5.0–8.0)

## 2019-09-09 NOTE — Progress Notes (Signed)
Subjective:     Julie Bishop is a 49 y.o. female here for a routine exam. Pt is s/p hyst. She is s/p SVD x1. Uncomplicated. She reports that during the pandemic, she gained 50#'s. She is working from home and has now lost 18#s. She reports pain in her left hip and has an appt with the ortho docs for eval. She reports that she has leaking if urine with running, laughing and coughing.     Gynecologic History No LMP recorded. Patient has had a hysterectomy. Contraception: status post hysterectomy Last Pap:01/01/2019. Results were: normal Last mammogram: 01/01/2019. Results were: normal  Obstetric History OB History  Gravida Para Term Preterm AB Living  3 1 1     1   SAB TAB Ectopic Multiple Live Births          1    # Outcome Date GA Lbr Len/2nd Weight Sex Delivery Anes PTL Lv  3 Term 1996 [redacted]w[redacted]d   M Vag-Spont None N LIV  2 Gravida           1 Gravida            The following portions of the patient's history were reviewed and updated as appropriate: allergies, current medications, past family history, past medical history, past social history, past surgical history and problem list.  Review of Systems Pertinent items are noted in HPI.    Objective:  BP 122/64   Pulse 67   Ht 5\' 3"  (1.6 m)   Wt 220 lb (99.8 kg)   BMI 38.97 kg/m   General Appearance:    Alert, cooperative, no distress, appears stated age  Head:    Normocephalic, without obvious abnormality, atraumatic  Eyes:    conjunctiva/corneas clear, EOM's intact, both eyes  Ears:    Normal external ear canals, both ears  Nose:   Nares normal, septum midline, mucosa normal, no drainage    or sinus tenderness  Throat:   Lips, mucosa, and tongue normal; teeth and gums normal  Neck:   Supple, symmetrical, trachea midline, no adenopathy;    thyroid:  no enlargement/tenderness/nodules  Back:     Symmetric, no curvature, ROM normal, no CVA tenderness  Lungs:     respirations unlabored  Chest Wall:    No tenderness or  deformity   Heart:    Regular rate and rhythm  Breast Exam:    No tenderness, masses, or nipple abnormality  Abdomen:     Soft, non-tender, bowel sounds active all four quadrants,    no masses, no organomegaly  Genitalia:    Normal female without lesion, discharge or tenderness   Pt is s/p hysterectomy. Her vaginal cuff is well healed.  Pt has tenderness over the left acetabulum with palpation internally.   Extremities:   Extremities normal, atraumatic, no cyanosis or edema  Pulses:   2+ and symmetric all extremities  Skin:   Skin color, texture, turgor normal, no rashes or lesions    Assessment:    Healthy female exam.   Stress urinary incontinence  Left hip pain- has appt scheduled to see ortho.  Weight management    Plan:    referral to PT for pelvic PT and strengthening of pelvic floor muscles.  F/u in 6 months or eval SUI.  Discuss weight management  Exercise  Charting/logging food- reviewed MyFitness Pal app. etc.  Annual mammograms  Julie Bishop, M.D., Julie Bishop

## 2019-09-09 NOTE — Patient Instructions (Signed)

## 2019-09-11 ENCOUNTER — Telehealth: Payer: Self-pay | Admitting: Cardiology

## 2019-09-11 DIAGNOSIS — M25552 Pain in left hip: Secondary | ICD-10-CM | POA: Diagnosis not present

## 2019-09-11 DIAGNOSIS — M25852 Other specified joint disorders, left hip: Secondary | ICD-10-CM | POA: Diagnosis not present

## 2019-09-11 NOTE — Telephone Encounter (Signed)
Patient is calling to make Julie Bishop aware that she began taking medication for hyperlipidemia on 08/28/19. She states she would like to inquire about whether or not she needs to wait to have lab work completed, due to delayed beginning of  Medication. Please advise.

## 2019-09-11 NOTE — Telephone Encounter (Signed)
Julie Bishop, Tell her usually about 6 - 8 weeks after starting the medication we get lipid levels. If she wants to wait 6 weeks after starting the medication to get the labs that is fine. Thanks

## 2019-09-14 NOTE — Telephone Encounter (Signed)
I spoke to the patient who said that she recently started her Atorvastatin on 4/2.  She will monitor for side effects and call us at the beginning of May for Lipid check.

## 2019-09-21 DIAGNOSIS — M25512 Pain in left shoulder: Secondary | ICD-10-CM | POA: Diagnosis not present

## 2019-09-29 DIAGNOSIS — G4733 Obstructive sleep apnea (adult) (pediatric): Secondary | ICD-10-CM | POA: Diagnosis not present

## 2019-10-06 DIAGNOSIS — M25552 Pain in left hip: Secondary | ICD-10-CM | POA: Diagnosis not present

## 2019-10-09 DIAGNOSIS — M7542 Impingement syndrome of left shoulder: Secondary | ICD-10-CM | POA: Insufficient documentation

## 2019-10-13 ENCOUNTER — Ambulatory Visit: Payer: Federal, State, Local not specified - PPO | Attending: Obstetrics & Gynecology | Admitting: Physical Therapy

## 2019-10-19 ENCOUNTER — Other Ambulatory Visit: Payer: Federal, State, Local not specified - PPO

## 2019-10-20 DIAGNOSIS — M25552 Pain in left hip: Secondary | ICD-10-CM | POA: Diagnosis not present

## 2019-10-20 DIAGNOSIS — M5416 Radiculopathy, lumbar region: Secondary | ICD-10-CM | POA: Diagnosis not present

## 2019-10-20 DIAGNOSIS — M545 Low back pain: Secondary | ICD-10-CM | POA: Diagnosis not present

## 2019-10-30 DIAGNOSIS — M5416 Radiculopathy, lumbar region: Secondary | ICD-10-CM | POA: Diagnosis not present

## 2019-11-09 DIAGNOSIS — M5416 Radiculopathy, lumbar region: Secondary | ICD-10-CM | POA: Diagnosis not present

## 2019-12-16 DIAGNOSIS — H43813 Vitreous degeneration, bilateral: Secondary | ICD-10-CM | POA: Diagnosis not present

## 2019-12-16 DIAGNOSIS — H43392 Other vitreous opacities, left eye: Secondary | ICD-10-CM | POA: Diagnosis not present

## 2019-12-16 DIAGNOSIS — H33011 Retinal detachment with single break, right eye: Secondary | ICD-10-CM | POA: Diagnosis not present

## 2019-12-17 DIAGNOSIS — H33011 Retinal detachment with single break, right eye: Secondary | ICD-10-CM | POA: Diagnosis not present

## 2019-12-18 DIAGNOSIS — H33011 Retinal detachment with single break, right eye: Secondary | ICD-10-CM | POA: Diagnosis not present

## 2019-12-26 ENCOUNTER — Encounter (HOSPITAL_BASED_OUTPATIENT_CLINIC_OR_DEPARTMENT_OTHER): Payer: Self-pay | Admitting: Emergency Medicine

## 2019-12-26 ENCOUNTER — Emergency Department (HOSPITAL_BASED_OUTPATIENT_CLINIC_OR_DEPARTMENT_OTHER): Payer: Federal, State, Local not specified - PPO

## 2019-12-26 ENCOUNTER — Emergency Department (HOSPITAL_BASED_OUTPATIENT_CLINIC_OR_DEPARTMENT_OTHER)
Admission: EM | Admit: 2019-12-26 | Discharge: 2019-12-26 | Disposition: A | Discharge status: Home / Self Care | Payer: Federal, State, Local not specified - PPO | Attending: Emergency Medicine | Admitting: Emergency Medicine

## 2019-12-26 DIAGNOSIS — C4490 Unspecified malignant neoplasm of skin, unspecified: Secondary | ICD-10-CM | POA: Insufficient documentation

## 2019-12-26 DIAGNOSIS — Z79899 Other long term (current) drug therapy: Secondary | ICD-10-CM | POA: Diagnosis not present

## 2019-12-26 DIAGNOSIS — M79605 Pain in left leg: Secondary | ICD-10-CM | POA: Diagnosis not present

## 2019-12-26 DIAGNOSIS — M79662 Pain in left lower leg: Secondary | ICD-10-CM | POA: Diagnosis not present

## 2019-12-26 DIAGNOSIS — Z9889 Other specified postprocedural states: Secondary | ICD-10-CM | POA: Diagnosis not present

## 2019-12-26 DIAGNOSIS — I10 Essential (primary) hypertension: Secondary | ICD-10-CM | POA: Diagnosis not present

## 2019-12-26 DIAGNOSIS — I82452 Acute embolism and thrombosis of left peroneal vein: Secondary | ICD-10-CM

## 2019-12-26 MED ORDER — APIXABAN (ELIQUIS) VTE STARTER PACK (10MG AND 5MG)
ORAL_TABLET | ORAL | 0 refills | Status: DC
Start: 2019-12-26 — End: 2020-06-21

## 2019-12-26 MED ORDER — APIXABAN (ELIQUIS) VTE STARTER PACK (10MG AND 5MG)
ORAL_TABLET | ORAL | 0 refills | Status: DC
Start: 2019-12-26 — End: 2019-12-26

## 2019-12-26 MED ORDER — APIXABAN 2.5 MG PO TABS
10.0000 mg | ORAL_TABLET | Freq: Two times a day (BID) | ORAL | Status: DC
  Administered 2019-12-26: 10 mg via ORAL
  Filled 2019-12-26: qty 4

## 2019-12-26 MED ORDER — APIXABAN 2.5 MG PO TABS: 5.0000 mg | ORAL_TABLET | Freq: Two times a day (BID) | ORAL | Status: DC

## 2019-12-26 NOTE — Discharge Instructions (Signed)
Please read and follow all provided instructions.  Your diagnoses today include:  1. Acute deep vein thrombosis (DVT) of left peroneal vein (HCC)     Tests performed today include:  An ultrasound of your leg that shows a blood clot  Vital signs. See below for your results today.   Medications prescribed:   Eliquis - medication for blood clot  Take any prescribed medications only as directed.  Home care instructions:  Follow any educational materials contained in this packet.  Stop baby aspirin while on apixaban.   Follow-up instructions: Please follow-up with your primary care provider in the next 3 days for further evaluation of your symptoms. This is very important for the management of your medications.   Return instructions:   Please return to the Emergency Department if you experience worsening symptoms.  Return immediately if you develop chest pain, shortness of breath, dizziness or fainting.  Return with new color change or weakness/numbness to your affected leg(s).  Return with bleeding, severe headache, confusion or altered mental status.  Please return if you have any other emergent concerns.  Additional Information:  Your vital signs today were: BP (!) 155/85 (BP Location: Right Arm)   Pulse 73   Temp 99.2 F (37.3 C) (Oral)   Resp 20   Ht 5\' 3"  (1.6 m)   Wt (!) 106.6 kg   SpO2 100%   BMI 41.63 kg/m  If your blood pressure (BP) was elevated above 135/85 this visit, please have this repeated by your doctor within one month. --------------

## 2019-12-26 NOTE — ED Provider Notes (Signed)
[facility] EMERGENCY DEPARTMENT Provider Note   CSN: [id number] Arrival date & time: 12/26/19  0957     History Chief Complaint  Patient presents with  . Leg Pain    Julie Bishop is a 49 y.o. female.  Patient with history of retinal detachment and eye surgery on 12/17/2019(Dr. Baird Cancer) and hypertension presenting today for left leg pain. She reports that she had surgery on 12/17/2019 for a detached retina and she was advised to "rest" by her surgeon so she has been resting with minimal activity since the surgery. She reports that she began experiencing symptoms of left leg soreness on 7/27. She says that the left leg soreness comes and goes, and is worse at night and with ambulation. She says that her symptoms have not worsened since they began 4 days ago. She has been taking ibuprofen and tylenol for pain after the surgery, with last dose of ibuprofen 2 days ago. She says that rest makes the pain better. She denies skin changes, swelling of her left leg, radiation of pain. Denies cigarette use, use of estrogen, recent travel by plane or long car rides, admits to hx skin cancer (2008 per chart review). Denies chest pain, shortness of breath, palpitations, cough.           Past Medical History:  Diagnosis Date  . Adjustment disorder 01/25/2017  . Anemia   . Anxiety and depression   . Back pain   . Cancer ([place]) 2008   skin cancer, BCC  . Constipation 11/14/2014  . Depression   . Generalized anxiety disorder   . GERD (gastroesophageal reflux disease)   . Headache 08/24/2015  . History of cardiovascular stress test    GXT 3/19: No ischemic ECG changes  . History of echocardiogram    Echo 2/18:  EF 50-55, GLS -20% (normal), normal wall motion, grade 1 diastolic dysfunction, trivial MR, mild RVE, normal RV SF, trivial TR, PASP 23  . Hyperlipidemia    Elevated, but never taken medication  . Hypertension   . Insomnia 08/24/2015  . Obesity 11/14/2014  . OSA  (obstructive sleep apnea) 09/12/2016  . Pain in joint, shoulder region 11/27/2015  . Palpitations   . Post-menopause 08/23/2015  . Raynauds syndrome   . Sleep apnea    appt with pulmonary Dr in march 2018  . Vitamin B12 deficiency 08/23/2015  . Vitamin D deficiency 08/23/2015    Patient Active Problem List   Diagnosis Date Noted  . Urinary incontinence 07/09/2019  . Palpitations 07/09/2019  . Hip pain, left 07/15/2018  . Otitis media of left ear 01/29/2018  . Hyperglycemia 05/02/2017  . Adjustment disorder 01/25/2017  . OSA (obstructive sleep apnea) 09/12/2016  . SOB (shortness of breath) 07/01/2016  . Chest pain 07/01/2016  . Headache disorder 04/01/2016  . Snoring 04/01/2016  . Pain in joint, shoulder region 11/27/2015  . Insomnia 08/24/2015  . Vitamin D deficiency 08/23/2015  . Vitamin B12 deficiency 08/23/2015  . Preventative health care 08/23/2015  . Post-menopause 08/23/2015  . Polydipsia 12/08/2014  . Constipation 11/14/2014  . Obesity 11/14/2014  . Anxiety and depression   . Cancer ([place])   . GERD (gastroesophageal reflux disease)   . Hyperlipidemia   . Hypertension     Past Surgical History:  Procedure Laterality Date  . ABDOMINAL HYSTERECTOMY  2010  . BREAST BIOPSY     right, 1996  . ECTOPIC PREGNANCY SURGERY    . SHOULDER OPEN ROTATOR CUFF REPAIR  2017  right  . TUBAL LIGATION     on right  . TYMPANOSTOMY TUBE PLACEMENT       OB History    Gravida  3   Para  1   Term  1   Preterm      AB      Living  1     SAB      TAB      Ectopic      Multiple      Live Births  1           Family History  Problem Relation Age of Onset  . Diabetes Mother   . Depression Mother   . Hypertension Mother   . Hyperlipidemia Mother   . Liver disease Mother   . Sleep apnea Mother   . Obesity Mother   . Heart attack Mother 40       CAD w/MI in mid 51s; s/p PCI  . Diabetes Father   . Hypertension Father   . Cancer Maternal Grandmother         throat  . Heart attack Maternal Grandmother   . Cancer Paternal Grandmother        lung  . Heart disease Paternal Grandmother   . Hypertension Sister        as a teenager    Social History   Tobacco Use  . Smoking status: Never Smoker  . Smokeless tobacco: Never Used  Vaping Use  . Vaping Use: Never used  Substance Use Topics  . Alcohol use: No    Alcohol/week: 0.0 standard drinks  . Drug use: No    Home Medications Prior to Admission medications   Medication Sig Start Date End Date Taking? Authorizing Provider  acetaminophen (TYLENOL) 500 MG tablet Take 1,000 mg by mouth every 6 (six) hours as needed.   Yes [provider]  atorvastatin (LIPITOR) 10 MG tablet Take 1 tablet (10 mg total) by mouth daily. 07/14/19  Yes Verta Ellen., NP  cholecalciferol (VITAMIN D3) 25 MCG (1000 UNIT) tablet Take 2,000 Units by mouth daily.   Yes [provider]  DULoxetine (CYMBALTA) 30 MG capsule Take 1 capsule (30 mg total) by mouth 3 (three) times daily. 07/09/19  Yes [name], MD  Enalapril-hydroCHLOROthiazide 5-12.5 MG tablet TAKE ONE TABLET BY MOUTH DAILY 09/01/19  Yes [name], MD  LORazepam (ATIVAN) 0.5 MG tablet TAKE ONE TABLET BY MOUTH TWICE A DAY AS NEEDED FOR ANXIETY 04/01/19  Yes [name], MD  metoprolol succinate (TOPROL-XL) 50 MG 24 hr tablet Take 1 tablet (50 mg total) by mouth 2 (two) times daily. Take with or immediately following a meal. 07/09/19  Yes [name], MD  ofloxacin (OCUFLOX) 0.3 % ophthalmic solution  12/16/19  Yes [provider]  prednisoLONE acetate (PRED FORTE) 1 % ophthalmic suspension  12/18/19  Yes [provider]  Vitamin D, Ergocalciferol, (DRISDOL) 1.25 MG (50000 UNIT) CAPS capsule Take 50,000 Units by mouth once a week. 10/16/19  Yes [provider]  aspirin EC 81 MG tablet Take 81 mg by mouth daily.    [provider]  methocarbamol (ROBAXIN) 750 MG tablet Take 750 mg by mouth 3  (three) times daily. 11/09/19   [provider]  oxyCODONE-acetaminophen (PERCOCET/ROXICET) 5-325 MG tablet Take by mouth. 12/16/19   [provider]    Allergies    Losartan and Oxycodone  Review of Systems   Review of Systems  Constitutional:  Negative for fever.  Eyes: Positive for redness.  Respiratory: Negative for shortness of breath.   Cardiovascular: Negative for chest pain, palpitations and leg swelling.  Gastrointestinal: Negative for abdominal pain, nausea and vomiting.  Musculoskeletal: Positive for myalgias. Negative for joint swelling.  Skin: Negative for rash.  Neurological: Negative for syncope and light-headedness.  Psychiatric/Behavioral: The patient is not nervous/anxious.     Physical Exam Updated Vital Signs BP (!) 155/85 (BP Location: Right Arm)   Pulse 73   Temp 99.2 F (37.3 C) (Oral)   Resp 20   Ht 5\' 3"  (1.6 m)   Wt (!) 106.6 kg   SpO2 100%   BMI 41.63 kg/m   Physical Exam Vitals and nursing note reviewed.  Constitutional:      Appearance: She is well-developed.  HENT:     Head: Normocephalic and atraumatic.     Mouth/Throat:     Mouth: Mucous membranes are not dry.  Eyes:     Comments: Postoperative changes in the right eye.  She has significant subconjunctival hemorrhage consistent with recent eye surgery.  Patient reports that this has been stable.  Foreign body sensation in her eye has been improving.  Cardiovascular:     Rate and Rhythm: Normal rate and regular rhythm.     Pulses: No decreased pulses.          Dorsalis pedis pulses are 2+ on the right side and 2+ on the left side.     Heart sounds: Normal heart sounds, S1 normal and S2 normal. No murmur heard.   Pulmonary:     Effort: Pulmonary effort is normal. No respiratory distress.     Breath sounds: No wheezing.  Chest:     Chest wall: No tenderness.  Abdominal:     General: Bowel sounds are normal.     Palpations: Abdomen is soft.     Tenderness: There is no  abdominal tenderness. There is no guarding or rebound.  Musculoskeletal:        General: Tenderness present. Normal range of motion.     Cervical back: Normal range of motion and neck supple.     Right lower leg: No edema.     Left lower leg: No edema.     Comments: Minimal tenderness to left calf.  No gross edema when compared to the right.  No overlying erythema.  Patient reports tenderness to the medial calf.  Skin:    General: Skin is warm and dry.     Coloration: Skin is not pale.  Neurological:     Mental Status: She is alert.     ED Results / Procedures / Treatments   Labs (all labs ordered are listed, but only abnormal results are displayed) Labs Reviewed - No data to display  EKG None  Radiology US Venous Img Lower Unilateral Left  Result Date: 12/26/2019 CLINICAL DATA:  LEFT calf pain for 4 days. Status post RIGHT eye surgery on 12/17/2019. EXAM: LEFT LOWER EXTREMITY VENOUS DOPPLER ULTRASOUND TECHNIQUE: Gray-scale sonography with compression, as well as color and duplex ultrasound, were performed to evaluate the deep venous system(s) from the level of the common femoral vein through the popliteal and proximal calf veins. COMPARISON:  None. FINDINGS: VENOUS Normal compressibility of the common femoral, superficial femoral, and popliteal veins. Visualized portions of profunda femoral vein and great saphenous vein unremarkable. No filling defects to suggest DVT on grayscale or color Doppler imaging. Doppler waveforms show normal direction of venous flow, normal respiratory plasticity and  response to augmentation. Limited views of the contralateral common femoral vein are unremarkable. OTHER LEFT posterior tibial vein is noncompressible with absence of color flow suggesting occlusive thrombus. Limitations: none IMPRESSION: 1. Findings suggest acute occlusive thrombus within the posterior tibial vein of the LEFT calf. 2. No evidence of DVT within the more proximal LEFT common femoral,  superficial femoral or popliteal veins. Electronically Signed   By: Franki Cabot M.D.   On: 12/26/2019 12:42    Procedures Procedures (including critical care time)  Medications Ordered in ED Medications  apixaban (ELIQUIS) tablet 10 mg (10 mg Oral Given 12/26/19 1459)    Followed by  apixaban (ELIQUIS) tablet 5 mg (has no administration in time range)    ED Course  I have reviewed the triage vital signs and the nursing notes.  Pertinent labs & imaging results that were available during my care of the patient were reviewed by me and considered in my medical decision making (see chart for details).  Patient seen and examined.  Patient with DVT study prior to my exam.  This demonstrated left peroneal vein DVT.  We will proceed with apixaban, after discussion with pharmacy.  Discussed with Dr. Johnney Killian. She is greater than 1 week after her eye surgery.  Seems to be healing well without complications.  Vital signs reviewed and are as follows: BP (!) 155/85 (BP Location: Right Arm)   Pulse 73   Temp 99.2 F (37.3 C) (Oral)   Resp 20   Ht 5\' 3"  (1.6 m)   Wt (!) 106.6 kg   SpO2 100%   BMI 41.63 kg/m   Plan for d/c to home. Rx starter pack.     MDM Rules/Calculators/A&P                          Patient with left calf tenderness after recent surgery and recent decreased activity.  No concerning features suggestive of PE today.  Vital signs without hypoxia or tachycardia.  Patient started on apixaban.  She has follow-up with ophthalmology next week and has appropriate PCP follow-up.  Final Clinical Impression(s) / ED Diagnoses Final diagnoses:  Acute deep vein thrombosis (DVT) of left peroneal vein ([place])    Rx / DC Orders ED Discharge Orders         Ordered    APIXABAN (ELIQUIS) VTE STARTER PACK (10MG  AND 5MG )     Discontinue  Reprint     12/26/19 1510           Carlisle Cater, PA-C 12/26/19 1513    Charlesetta Shanks, MD 12/30/19 [phone]

## 2019-12-26 NOTE — ED Notes (Signed)
ED Provider at bedside. Alecia Lemming, [place]

## 2019-12-26 NOTE — ED Triage Notes (Signed)
L calf pain x 4 days. Recent eye surgery 7/22

## 2019-12-27 DIAGNOSIS — I82402 Acute embolism and thrombosis of unspecified deep veins of left lower extremity: Secondary | ICD-10-CM

## 2019-12-27 HISTORY — DX: Acute embolism and thrombosis of unspecified deep veins of left lower extremity: I82.402

## 2019-12-28 DIAGNOSIS — G4733 Obstructive sleep apnea (adult) (pediatric): Secondary | ICD-10-CM | POA: Diagnosis not present

## 2019-12-30 ENCOUNTER — Ambulatory Visit: Payer: Federal, State, Local not specified - PPO | Admitting: Medical

## 2019-12-30 ENCOUNTER — Other Ambulatory Visit: Payer: Self-pay

## 2019-12-30 VITALS — BP 157/85 | HR 83 | Resp 18 | Ht 63.0 in | Wt 235.4 lb

## 2019-12-30 DIAGNOSIS — I82A12 Acute embolism and thrombosis of left axillary vein: Secondary | ICD-10-CM

## 2019-12-30 MED ORDER — APIXABAN 5 MG PO TABS
5.0000 mg | ORAL_TABLET | Freq: Two times a day (BID) | ORAL | 5 refills | Status: DC
Start: 2019-12-30 — End: 2020-05-05

## 2019-12-30 NOTE — Progress Notes (Signed)
Subjective:    Patient ID: Julie Bishop, female    DOB: 1970/11/10, 49 y.o.   MRN: [id number]  HPI  Pt has recent dvt. This occurred recently/ diagnosed on  12/26/2019 at ED. Pt had eye surgery 12/17/2019. She was having left lower calf pain for days after laying down resting. She noticed slight pain initially then gradually got worse with some swelling. Since starting eliquis pain gradually decrease and swelling decreased bust states some swelling still present. No dose of med skipped. No sob or wheezing.  She has never had DVT.   hpi from ED.  Patient with history of retinal detachment and eye surgery on 12/17/2019(Dr. Baird Cancer) and hypertension presenting today for left leg pain. She reports that she had surgery on 12/17/2019 for a detached retina and she was advised to "rest" by her surgeon so she has been resting with minimal activity since the surgery. She reports that she began experiencing symptoms of left leg soreness on 7/27. She says that the left leg soreness comes and goes, and is worse at night and with ambulation. She says that her symptoms have not worsened since they began 4 days ago. She has been taking ibuprofen and tylenol for pain after the surgery, with last dose of ibuprofen 2 days ago. She says that rest makes the pain better. She denies skin changes, swelling of her left leg, radiation of pain. Denies cigarette use, use of estrogen, recent travel by plane or long car rides, admits to hx skin cancer (2008 per chart review). Denies chest pain, shortness of breath, palpitations, cough.    Past Medical History:  Diagnosis Date  . Adjustment disorder 01/25/2017  . Anemia   . Anxiety and depression   . Back pain   . Cancer ([place]) 2008   skin cancer, BCC  . Constipation 11/14/2014  . Depression   . Generalized anxiety disorder   . GERD (gastroesophageal reflux disease)   . Headache 08/24/2015  . History of cardiovascular stress test    GXT 3/19: No ischemic ECG  changes  . History of echocardiogram    Echo 2/18:  EF 50-55, GLS -20% (normal), normal wall motion, grade 1 diastolic dysfunction, trivial MR, mild RVE, normal RV SF, trivial TR, PASP 23  . Hyperlipidemia    Elevated, but never taken medication  . Hypertension   . Insomnia 08/24/2015  . Obesity 11/14/2014  . OSA (obstructive sleep apnea) 09/12/2016  . Pain in joint, shoulder region 11/27/2015  . Palpitations   . Post-menopause 08/23/2015  . Raynauds syndrome   . Sleep apnea    appt with pulmonary Dr in march 2018  . Vitamin B12 deficiency 08/23/2015  . Vitamin D deficiency 08/23/2015     Social History   Socioeconomic History  . Marital status: Married    Spouse name: Seleen Walter  . Number of children: 1  . Years of education: 35  . Highest education level: Not on file  Occupational History  . 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[employer]  Tobacco Use  . Smoking status: Never Smoker  . Smokeless tobacco: Never Used  Vaping Use  . Vaping Use: Never used  Substance and Sexual Activity  . Alcohol use: No    Alcohol/week: 0.0 standard drinks  . Drug use: No  . 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  Not on file  Social History Narrative  . Not on file   Social Determinants of Health   Financial Resource Strain:   . Difficulty of Paying Living Expenses:   Food Insecurity:   . Worried About Charity fundraiser in the Last Year:   . Arboriculturist in the Last Year:   Transportation Needs:   . Film/video editor (Medical):   Marland Kitchen Lack of Transportation (Non-Medical):   Physical Activity:   . Days of Exercise per Week:   . Minutes of Exercise per Session:   Stress:   . Feeling of Stress :   Social Connections:   . Frequency of Communication with Friends and Family:   . Frequency of Social Gatherings  with Friends and Family:   . Attends Religious Services:   . Active Member of Clubs or Organizations:   . Attends Archivist Meetings:   Marland Kitchen Marital Status:   Intimate Partner Violence:   . Fear of Current or Ex-Partner:   . Emotionally Abused:   Marland Kitchen Physically Abused:   . Sexually Abused:     Past Surgical History:  Procedure Laterality Date  . ABDOMINAL HYSTERECTOMY  2010  . BREAST BIOPSY     right, 1996  . ECTOPIC PREGNANCY SURGERY    . SHOULDER OPEN ROTATOR CUFF REPAIR  2017   right  . TUBAL LIGATION     on right  . TYMPANOSTOMY TUBE PLACEMENT      Family History  Problem Relation Age of Onset  . Diabetes Mother   . Depression Mother   . Hypertension Mother   . Hyperlipidemia Mother   . Liver disease Mother   . Sleep apnea Mother   . Obesity Mother   . Heart attack Mother 44       CAD w/MI in mid 40s; s/p PCI  . Diabetes Father   . Hypertension Father   . Cancer Maternal Grandmother        throat  . Heart attack Maternal Grandmother   . Cancer Paternal Grandmother        lung  . Heart disease Paternal Grandmother   . Hypertension Sister        as a teenager    Allergies  Allergen Reactions  . Losartan Other (See Comments)    Diarrhea and abodminal cramping  . Oxycodone Itching    Current Outpatient Medications on File Prior to Visit  Medication Sig Dispense Refill  . acetaminophen (TYLENOL) 500 MG tablet Take 1,000 mg by mouth every 6 (six) hours as needed.    . APIXABAN (ELIQUIS) VTE STARTER PACK (10MG  AND 5MG ) Take as directed on package: start with two-5mg  tablets twice daily for 7 days. On day 8, switch to one-5mg  tablet twice daily. 1 each 0  . atorvastatin (LIPITOR) 10 MG tablet Take 1 tablet (10 mg total) by mouth daily. 90 tablet 3  . cholecalciferol (VITAMIN D3) 25 MCG (1000 UNIT) tablet Take 2,000 Units by mouth daily.    . DULoxetine (CYMBALTA) 30 MG capsule Take 1 capsule (30 mg total) by mouth 3 (three) times daily. 270 capsule 1  .  Enalapril-hydroCHLOROthiazide 5-12.5 MG tablet TAKE ONE TABLET BY MOUTH DAILY 90 tablet 1  . LORazepam (ATIVAN) 0.5 MG tablet TAKE ONE TABLET BY MOUTH TWICE A DAY AS NEEDED FOR ANXIETY 30 tablet 1  . methocarbamol (ROBAXIN) 750 MG tablet Take 750 mg by mouth 3 (three) times daily. (Patient not taking: Reported on 12/30/2019)    .  metoprolol succinate (TOPROL-XL) 50 MG 24 hr tablet Take 1 tablet (50 mg total) by mouth 2 (two) times daily. Take with or immediately following a meal. 180 tablet 1  . ofloxacin (OCUFLOX) 0.3 % ophthalmic solution  (Patient not taking: Reported on 12/30/2019)    . oxyCODONE-acetaminophen (PERCOCET/ROXICET) 5-325 MG tablet Take by mouth. (Patient not taking: Reported on 12/30/2019)    . prednisoLONE acetate (PRED FORTE) 1 % ophthalmic suspension     . Vitamin D, Ergocalciferol, (DRISDOL) 1.25 MG (50000 UNIT) CAPS capsule Take 50,000 Units by mouth once a week.     No current facility-administered medications on file prior to visit.    BP (!) 157/85 (BP Location: Left Arm, Patient Position: Sitting, Cuff Size: Large)   Pulse 83   Resp 18   Ht 5\' 3"  (1.6 m)   Wt 235 lb 6.4 oz (106.8 kg)   SpO2 100%   BMI 41.70 kg/m     Review of Systems  Constitutional: Negative for chills, fatigue and fever.  Respiratory: Negative for cough, chest tightness, shortness of breath and wheezing.   Cardiovascular: Negative for chest pain and palpitations.  Gastrointestinal: Negative for abdominal pain.  Musculoskeletal: Negative for back pain.       See hpi.  Skin: Negative for rash.  Neurological: Negative for dizziness, syncope, speech difficulty, numbness and headaches.  Hematological: Negative for adenopathy. Does not bruise/bleed easily.       Objective:   Physical Exam  General- No acute distress. Pleasant patient. Neck- Full range of motion, no jvd Lungs- Clear, even and unlabored. Heart- regular rate and rhythm. Neurologic- CNII- XII grossly intact.  Left lower leg-  Negative homans signs. Faint lower ext pain distal aspect. No obvious severe swelling.        Assessment & Plan:  Recent diagnosis of DVT and on about day 4 of starter pack given by the emergency department.  Advised to continue starter pack and when starter pack is finished then start prescription I sent to pharmacy.  It will be Eliquis 5 mg twice daily.  Refills given.  Went ahead and place referral to Dr. Marin Olp or partner to do coagulation work-up.  Also given advice on duration of treatment as well as potentially when to repeat ultrasound.  Counseled on course of the DVT.  Would expect pain to keep decreasing  and swelling to decrease.  Questions on need to repeat ultrasound.  Explained typically as long as patient is taking and and clinically doing well ultrasound not indicated.  However if leg/calf pain were to worsen, increased swelling were to occur or any shortness of breath then in that event would need ultrasound repeat and possible work-up for PE.  Would not expect need for imaging at this time but pay attention to signs/symptoms and if any such symptoms would occur please let us know.  Please make sure compliant on medication.  Follow-up with hematologist or as needed.  Time spent with patient today was 34  minutes which consisted of chart review, discussing diagnosis,  Treatment, referral, potential future test, answering question, placing referral and documentation.

## 2019-12-30 NOTE — Patient Instructions (Addendum)
Recent diagnosis of DVT and on about day 4 of starter pack given by the emergency department.  Advised to continue starter pack and when starter pack is finished then start prescription I sent to pharmacy.  It will be Eliquis 5 mg twice daily.  Refills given.  Went ahead and place referral to Dr. Marin Olp or partner to do coagulation work-up.  Also given advice on duration of treatment as well as potentially when to repeat ultrasound.  Counseled on course of the DVT.  Would expect pain to keep decreasing  and swelling to decrease.  Questions on need to repeat ultrasound.  Explained typically as long as patient is taking and and clinically doing well ultrasound not indicated.  However if leg/calf pain were to worsen, increased swelling were to occur or any shortness of breath then in that event would need ultrasound repeat and possible work-up for PE.  Would not expect need for imaging at this time but pay attention to signs/symptoms and if any such symptoms would occur please let us know.  Please make sure compliant on medication.  Follow-up with hematologist or as needed.

## 2020-01-01 ENCOUNTER — Telehealth: Payer: Self-pay | Admitting: Family

## 2020-01-01 NOTE — Telephone Encounter (Signed)
I called and spoke with patient regarding new patient appointment.  I advised that she would need to be here at least 15-30 minutes early.  I have also mailed her a New Patient Packet as well.

## 2020-01-19 DIAGNOSIS — H33011 Retinal detachment with single break, right eye: Secondary | ICD-10-CM | POA: Diagnosis not present

## 2020-01-20 ENCOUNTER — Other Ambulatory Visit: Payer: Self-pay | Admitting: Family

## 2020-01-20 DIAGNOSIS — I82442 Acute embolism and thrombosis of left tibial vein: Secondary | ICD-10-CM

## 2020-01-20 DIAGNOSIS — D6859 Other primary thrombophilia: Secondary | ICD-10-CM

## 2020-01-21 ENCOUNTER — Other Ambulatory Visit: Payer: Self-pay

## 2020-01-21 ENCOUNTER — Inpatient Hospital Stay: Payer: Federal, State, Local not specified - PPO | Attending: Hematology & Oncology

## 2020-01-21 ENCOUNTER — Inpatient Hospital Stay (HOSPITAL_BASED_OUTPATIENT_CLINIC_OR_DEPARTMENT_OTHER): Payer: Federal, State, Local not specified - PPO | Admitting: Family

## 2020-01-21 ENCOUNTER — Other Ambulatory Visit (HOSPITAL_BASED_OUTPATIENT_CLINIC_OR_DEPARTMENT_OTHER): Payer: Self-pay | Admitting: Family Medicine

## 2020-01-21 ENCOUNTER — Telehealth: Payer: Self-pay | Admitting: Family

## 2020-01-21 ENCOUNTER — Encounter: Payer: Self-pay | Admitting: Family

## 2020-01-21 VITALS — BP 130/74 | HR 65 | Temp 97.9°F | Resp 18 | Ht 63.0 in | Wt 240.4 lb

## 2020-01-21 DIAGNOSIS — Z801 Family history of malignant neoplasm of trachea, bronchus and lung: Secondary | ICD-10-CM

## 2020-01-21 DIAGNOSIS — I10 Essential (primary) hypertension: Secondary | ICD-10-CM | POA: Diagnosis not present

## 2020-01-21 DIAGNOSIS — F419 Anxiety disorder, unspecified: Secondary | ICD-10-CM | POA: Diagnosis not present

## 2020-01-21 DIAGNOSIS — M62831 Muscle spasm of calf: Secondary | ICD-10-CM

## 2020-01-21 DIAGNOSIS — Z8249 Family history of ischemic heart disease and other diseases of the circulatory system: Secondary | ICD-10-CM

## 2020-01-21 DIAGNOSIS — Z885 Allergy status to narcotic agent status: Secondary | ICD-10-CM

## 2020-01-21 DIAGNOSIS — D6859 Other primary thrombophilia: Secondary | ICD-10-CM

## 2020-01-21 DIAGNOSIS — Z8379 Family history of other diseases of the digestive system: Secondary | ICD-10-CM

## 2020-01-21 DIAGNOSIS — Z818 Family history of other mental and behavioral disorders: Secondary | ICD-10-CM | POA: Insufficient documentation

## 2020-01-21 DIAGNOSIS — Z833 Family history of diabetes mellitus: Secondary | ICD-10-CM | POA: Diagnosis not present

## 2020-01-21 DIAGNOSIS — R0789 Other chest pain: Secondary | ICD-10-CM | POA: Insufficient documentation

## 2020-01-21 DIAGNOSIS — E785 Hyperlipidemia, unspecified: Secondary | ICD-10-CM | POA: Diagnosis not present

## 2020-01-21 DIAGNOSIS — Z79899 Other long term (current) drug therapy: Secondary | ICD-10-CM | POA: Insufficient documentation

## 2020-01-21 DIAGNOSIS — Z808 Family history of malignant neoplasm of other organs or systems: Secondary | ICD-10-CM | POA: Insufficient documentation

## 2020-01-21 DIAGNOSIS — I82402 Acute embolism and thrombosis of unspecified deep veins of left lower extremity: Secondary | ICD-10-CM | POA: Insufficient documentation

## 2020-01-21 DIAGNOSIS — I82442 Acute embolism and thrombosis of left tibial vein: Secondary | ICD-10-CM

## 2020-01-21 DIAGNOSIS — Z8349 Family history of other endocrine, nutritional and metabolic diseases: Secondary | ICD-10-CM | POA: Insufficient documentation

## 2020-01-21 DIAGNOSIS — M25552 Pain in left hip: Secondary | ICD-10-CM | POA: Insufficient documentation

## 2020-01-21 DIAGNOSIS — F329 Major depressive disorder, single episode, unspecified: Secondary | ICD-10-CM | POA: Diagnosis not present

## 2020-01-21 DIAGNOSIS — Z7901 Long term (current) use of anticoagulants: Secondary | ICD-10-CM | POA: Diagnosis not present

## 2020-01-21 DIAGNOSIS — G4730 Sleep apnea, unspecified: Secondary | ICD-10-CM | POA: Diagnosis not present

## 2020-01-21 DIAGNOSIS — Z1231 Encounter for screening mammogram for malignant neoplasm of breast: Secondary | ICD-10-CM

## 2020-01-21 DIAGNOSIS — R0602 Shortness of breath: Secondary | ICD-10-CM | POA: Diagnosis not present

## 2020-01-21 DIAGNOSIS — Z85828 Personal history of other malignant neoplasm of skin: Secondary | ICD-10-CM | POA: Insufficient documentation

## 2020-01-21 DIAGNOSIS — M79662 Pain in left lower leg: Secondary | ICD-10-CM | POA: Insufficient documentation

## 2020-01-21 LAB — D-DIMER, QUANTITATIVE: D-Dimer, Quant: 0.29 ug/mL-FEU (ref 0.00–0.50)

## 2020-01-21 LAB — CMP (CANCER CENTER ONLY)
ALT: 22 U/L (ref 0–44)
AST: 15 U/L (ref 15–41)
Albumin: 4.1 g/dL (ref 3.5–5.0)
Alkaline Phosphatase: 62 U/L (ref 38–126)
Anion gap: 7 (ref 5–15)
BUN: 13 mg/dL (ref 6–20)
CO2: 30 mmol/L (ref 22–32)
Calcium: 9.4 mg/dL (ref 8.9–10.3)
Chloride: 102 mmol/L (ref 98–111)
Creatinine: 0.76 mg/dL (ref 0.44–1.00)
GFR, Est AFR Am: >60 mL/min (ref >60)
GFR, Estimated: >60 mL/min (ref >60)
Glucose, Bld: 116 mg/dL — ABNORMAL HIGH (ref 70–99)
Potassium: 3.6 mmol/L (ref 3.5–5.1)
Sodium: 139 mmol/L (ref 135–145)
Total Bilirubin: 0.5 mg/dL (ref 0.3–1.2)
Total Protein: 6.9 g/dL (ref 6.5–8.1)

## 2020-01-21 LAB — CBC WITH DIFFERENTIAL (CANCER CENTER ONLY)
Abs Immature Granulocytes: 0.02 10*3/uL (ref 0.00–0.07)
Basophils Absolute: 0 10*3/uL (ref 0.0–0.1)
Basophils Relative: 0 %
Eosinophils Absolute: 0.1 10*3/uL (ref 0.0–0.5)
Eosinophils Relative: 2 %
HCT: 34 % — ABNORMAL LOW (ref 36.0–46.0)
Hemoglobin: 11.2 g/dL — ABNORMAL LOW (ref 12.0–15.0)
Immature Granulocytes: 0 %
Lymphocytes Relative: 32 %
Lymphs Abs: 1.8 10*3/uL (ref 0.7–4.0)
MCH: 28.1 pg (ref 26.0–34.0)
MCHC: 32.9 g/dL (ref 30.0–36.0)
MCV: 85.4 fL (ref 80.0–100.0)
Monocytes Absolute: 0.3 10*3/uL (ref 0.1–1.0)
Monocytes Relative: 6 %
Neutro Abs: 3.4 10*3/uL (ref 1.7–7.7)
Neutrophils Relative %: 60 %
Platelet Count: 244 10*3/uL (ref 150–400)
RBC: 3.98 MIL/uL (ref 3.87–5.11)
RDW: 13.3 % (ref 11.5–15.5)
WBC Count: 5.7 10*3/uL (ref 4.0–10.5)
nRBC: 0 % (ref 0.0–0.2)

## 2020-01-21 LAB — ANTITHROMBIN III: AntiThromb III Func: 91 % (ref 75–120)

## 2020-01-21 LAB — IRON AND TIBC
Iron: 55 ug/dL (ref 41–142)
Saturation Ratios: 15 % — ABNORMAL LOW (ref 21–57)
TIBC: 353 ug/dL (ref 236–444)
UIBC: 298 ug/dL (ref 120–384)

## 2020-01-21 LAB — LACTATE DEHYDROGENASE: LDH: 217 U/L — ABNORMAL HIGH (ref 98–192)

## 2020-01-21 LAB — FERRITIN: Ferritin: 44 ng/mL (ref 11–307)

## 2020-01-21 LAB — SAVE SMEAR(SSMR), FOR PROVIDER SLIDE REVIEW

## 2020-01-21 NOTE — Progress Notes (Signed)
Hematology/Oncology Consultation   Name: Julie Bishop      MRN: [id number]    Location: Room/bed info not found  Date: 01/21/2020 Time:8:48 AM   REFERRING PHYSICIAN: Mackie Pai, PA-C  REASON FOR CONSULT: DVT of the left lower extremity   DIAGNOSIS: DVT of the left lower extremity  HISTORY OF PRESENT ILLNESS: Julie Bishop is a very pleasant 49 yo caucasian female with new diagnosis of DVT within the posterior tibial vein of the left calf.  She had started having left calf pain (like a charlie horse) off and on in March-May.  She had surgery on 7/22 to repair her detached retina in there right eye. She was sedentary for several days recuperating when she noted constant pain in the left calf.  She has no prior history of thrombus. No family history,  She is doing well on Eliquis and has had no issues with bleeding, bruising or petechiae.  She had a basal cell carcinoma removed from the left neck 2008. She states that she needs to follow-up with dermatology for routine skin check.  Family history of cancer includes: maternal grandmother - throat (smoker) and paternal grandmother - esophageal and lung (smoker).  She had a partial hysterectomy in 2010 and has 1 ovary. She denies taking any hormone replacement or work out supplements.  No diabetes or history of thyroid disease.  She has sleep apnea and wears a CPAP at night. She notes some mild SOB at times at night while wearing her mask and has to take it off.  She has occasional chest discomfort and states that she is followed by cardiology but so far her work up has been negative.  No fever, chills, n/v, cough, rash, dizziness, SOB, palpitations, abdominal pain or changes in bowel or bladder habits.  No swelling, numbness or tingling in her extremities at this time.  She has been going to [facility] and PT for her left hip pain.  No falls or syncope.  She has maintained a good appetite and is making an effort to stay  well hydrated. Her weight is stable.  No smoking or recreational drug use. She has the occasional margarita when she goes to the [employer].  She work as a Engineer, mining for [employer].   ROS: All other 10 point review of systems is negative.   PAST MEDICAL HISTORY:   Past Medical History:  Diagnosis Date  . Adjustment disorder 01/25/2017  . Anemia   . Anxiety and depression   . Back pain   . Cancer ([place]) 2008   skin cancer, BCC  . Constipation 11/14/2014  . Depression   . Generalized anxiety disorder   . GERD (gastroesophageal reflux disease)   . Headache 08/24/2015  . History of cardiovascular stress test    GXT 3/19: No ischemic ECG changes  . History of echocardiogram    Echo 2/18:  EF 50-55, GLS -20% (normal), normal wall motion, grade 1 diastolic dysfunction, trivial MR, mild RVE, normal RV SF, trivial TR, PASP 23  . Hyperlipidemia    Elevated, but never taken medication  . Hypertension   . Insomnia 08/24/2015  . Obesity 11/14/2014  . OSA (obstructive sleep apnea) 09/12/2016  . Pain in joint, shoulder region 11/27/2015  . Palpitations   . Post-menopause 08/23/2015  . Raynauds syndrome   . Sleep apnea    appt with pulmonary Dr in march 2018  . Vitamin B12 deficiency 08/23/2015  . Vitamin D deficiency 08/23/2015    ALLERGIES:  Allergies  Allergen Reactions  . Losartan Other (See Comments)    Diarrhea and abodminal cramping  . Oxycodone Itching      MEDICATIONS:  Current Outpatient Medications on File Prior to Visit  Medication Sig Dispense Refill  . acetaminophen (TYLENOL) 500 MG tablet Take 1,000 mg by mouth every 6 (six) hours as needed.    Marland Kitchen apixaban (ELIQUIS) 5 MG TABS tablet Take 1 tablet (5 mg total) by mouth 2 (two) times daily. 60 tablet 5  . APIXABAN (ELIQUIS) VTE STARTER PACK (10MG  AND 5MG ) Take as directed on package: start with two-5mg  tablets twice daily for 7 days. On day 8, switch to one-5mg  tablet twice daily. 1 each 0  . atorvastatin  (LIPITOR) 10 MG tablet Take 1 tablet (10 mg total) by mouth daily. 90 tablet 3  . cholecalciferol (VITAMIN D3) 25 MCG (1000 UNIT) tablet Take 2,000 Units by mouth daily.    . DULoxetine (CYMBALTA) 30 MG capsule Take 1 capsule (30 mg total) by mouth 3 (three) times daily. 270 capsule 1  . Enalapril-hydroCHLOROthiazide 5-12.5 MG tablet TAKE ONE TABLET BY MOUTH DAILY 90 tablet 1  . LORazepam (ATIVAN) 0.5 MG tablet TAKE ONE TABLET BY MOUTH TWICE A DAY AS NEEDED FOR ANXIETY 30 tablet 1  . methocarbamol (ROBAXIN) 750 MG tablet Take 750 mg by mouth 3 (three) times daily. (Patient not taking: Reported on 12/30/2019)    . metoprolol succinate (TOPROL-XL) 50 MG 24 hr tablet Take 1 tablet (50 mg total) by mouth 2 (two) times daily. Take with or immediately following a meal. 180 tablet 1  . ofloxacin (OCUFLOX) 0.3 % ophthalmic solution  (Patient not taking: Reported on 12/30/2019)    . oxyCODONE-acetaminophen (PERCOCET/ROXICET) 5-325 MG tablet Take by mouth. (Patient not taking: Reported on 12/30/2019)    . prednisoLONE acetate (PRED FORTE) 1 % ophthalmic suspension     . Vitamin D, Ergocalciferol, (DRISDOL) 1.25 MG (50000 UNIT) CAPS capsule Take 50,000 Units by mouth once a week.     No current facility-administered medications on file prior to visit.     PAST SURGICAL HISTORY Past Surgical History:  Procedure Laterality Date  . ABDOMINAL HYSTERECTOMY  2010  . BREAST BIOPSY     right, 1996  . ECTOPIC PREGNANCY SURGERY    . SHOULDER OPEN ROTATOR CUFF REPAIR  2017   right  . TUBAL LIGATION     on right  . TYMPANOSTOMY TUBE PLACEMENT      FAMILY HISTORY: Family History  Problem Relation Age of Onset  . Diabetes Mother   . Depression Mother   . Hypertension Mother   . Hyperlipidemia Mother   . Liver disease Mother   . Sleep apnea Mother   . Obesity Mother   . Heart attack Mother 4       CAD w/MI in mid 70s; s/p PCI  . Diabetes Father   . Hypertension Father   . Cancer Maternal Grandmother         throat  . Heart attack Maternal Grandmother   . Cancer Paternal Grandmother        lung  . Heart disease Paternal Grandmother   . Hypertension Sister        as a teenager    SOCIAL HISTORY:  reports that she has never smoked. She has never used smokeless tobacco. She reports that she does not drink alcohol and does not use drugs.  PERFORMANCE STATUS: The patient's performance status is 1 - Symptomatic but completely ambulatory  PHYSICAL EXAM: Most Recent Vital Signs: There were no vitals taken for this visit. There were no vitals taken for this visit.  General Appearance:    Alert, cooperative, no distress, appears stated age  Head:    Normocephalic, without obvious abnormality, atraumatic  Eyes:    PERRL, conjunctiva/corneas clear, EOM's intact, fundi    benign, both eyes        Throat:   Lips, mucosa, and tongue normal; teeth and gums normal  Neck:   Supple, symmetrical, trachea midline, no adenopathy;    thyroid:  no enlargement/tenderness/nodules; no carotid   bruit or JVD  Back:     Symmetric, no curvature, ROM normal, no CVA tenderness  Lungs:     Clear to auscultation bilaterally, respirations unlabored  Chest Wall:    No tenderness or deformity   Heart:    Regular rate and rhythm, S1 and S2 normal, no murmur, rub   or gallop     Abdomen:     Soft, non-tender, bowel sounds active all four quadrants,    no masses, no organomegaly        Extremities:   Extremities normal, atraumatic, no cyanosis or edema  Pulses:   2+ and symmetric all extremities  Skin:   Skin color, texture, turgor normal, no rashes or lesions  Lymph nodes:   Cervical, supraclavicular, and axillary nodes normal  Neurologic:   CNII-XII intact, normal strength, sensation and reflexes    throughout    LABORATORY DATA:  Results for orders placed or performed in visit on 01/21/20 (from the past 48 hour(s))  CBC with Differential (Cancer Center Only)     Status: Abnormal   Collection Time:  01/21/20  8:12 AM  Result Value Ref Range   WBC Count 5.7 4.0 - 10.5 K/uL   RBC 3.98 3.87 - 5.11 MIL/uL   Hemoglobin 11.2 (L) 12.0 - 15.0 g/dL   HCT 34.0 (L) 36 - 46 %   MCV 85.4 80.0 - 100.0 fL   MCH 28.1 26.0 - 34.0 pg   MCHC 32.9 30.0 - 36.0 g/dL   RDW 13.3 11.5 - 15.5 %   Platelet Count 244 150 - 400 K/uL   nRBC 0.0 0.0 - 0.2 %   Neutrophils Relative % 60 %   Neutro Abs 3.4 1.7 - 7.7 K/uL   Lymphocytes Relative 32 %   Lymphs Abs 1.8 0.7 - 4.0 K/uL   Monocytes Relative 6 %   Monocytes Absolute 0.3 0 - 1 K/uL   Eosinophils Relative 2 %   Eosinophils Absolute 0.1 0 - 0 K/uL   Basophils Relative 0 %   Basophils Absolute 0.0 0 - 0 K/uL   Immature Granulocytes 0 %   Abs Immature Granulocytes 0.02 0.00 - 0.07 K/uL    Comment: Performed at [facility] at [facility], [address] [place], [place] [address]  [facility])     Status: None   Collection Time: 01/21/20  8:12 AM  Result Value Ref Range   Smear Review SMEAR STAINED AND AVAILABLE FOR REVIEW     Comment: Performed at [facility] at [facility], [address], [place], [place] [address]      RADIOGRAPHY: No results found.     PATHOLOGY: None  ASSESSMENT/PLAN: Ms. Siegrist is a very pleasant 49 yo caucasian female with new diagnosis of DVT within the posterior tibial vein of the left calf post eye surgery.  She is tolerating Eliquis nicely and the pain and swelling in her left calf has resolved.  She has no prior history of thrombus. We will see if her hypercoag panel reveals any underlying reason for her to have developed a blood clot.  We will go ahead and plan to see her again in October and repeat an [place] of her left leg at that time to assess her response.   All questions were answered and she is in agreement with the plan. She can contact our office with any questions or concerns. We can certainly see her sooner if needed.   She was discussed with Dr.  Marin Olp and he is in agreement with the aforementioned.   Laverna Peace, NP

## 2020-01-21 NOTE — Telephone Encounter (Signed)
Appointments scheduled calendar printed per 8/26 los 

## 2020-01-22 LAB — BETA-2-GLYCOPROTEIN I ABS, IGG/M/A
Beta-2 Glyco I IgG: <9 GPI IgG units (ref 0–20)
Beta-2-Glycoprotein I IgA: <9 GPI IgA units (ref 0–25)
Beta-2-Glycoprotein I IgM: <9 GPI IgM units (ref 0–32)

## 2020-01-22 LAB — HOMOCYSTEINE: Homocysteine: 10.7 umol/L (ref 0.0–14.5)

## 2020-01-23 LAB — PROTEIN C, TOTAL: Protein C, Total: 119 % (ref 60–150)

## 2020-01-23 LAB — LUPUS ANTICOAGULANT PANEL
DRVVT: 51.8 s — ABNORMAL HIGH (ref 0.0–47.0)
PTT Lupus Anticoagulant: 34.2 s (ref 0.0–51.9)

## 2020-01-23 LAB — DRVVT MIX: dRVVT Mix: 42.5 s — ABNORMAL HIGH (ref 0.0–40.4)

## 2020-01-23 LAB — PROTEIN S ACTIVITY: Protein S Activity: 100 % (ref 63–140)

## 2020-01-23 LAB — CARDIOLIPIN ANTIBODIES, IGG, IGM, IGA
Anticardiolipin IgA: <9 APL U/mL (ref 0–11)
Anticardiolipin IgG: <9 GPL U/mL (ref 0–14)
Anticardiolipin IgM: <9 MPL U/mL (ref 0–12)

## 2020-01-23 LAB — DRVVT CONFIRM: dRVVT Confirm: 1.1 ratio (ref 0.8–1.2)

## 2020-01-23 LAB — PROTEIN S, TOTAL: Protein S Ag, Total: 107 % (ref 60–150)

## 2020-01-23 LAB — PROTEIN C ACTIVITY: Protein C Activity: 143 % (ref 73–180)

## 2020-01-26 ENCOUNTER — Other Ambulatory Visit: Payer: Self-pay

## 2020-01-26 ENCOUNTER — Encounter (HOSPITAL_BASED_OUTPATIENT_CLINIC_OR_DEPARTMENT_OTHER): Payer: Self-pay

## 2020-01-26 ENCOUNTER — Ambulatory Visit (HOSPITAL_BASED_OUTPATIENT_CLINIC_OR_DEPARTMENT_OTHER)
Admission: RE | Admit: 2020-01-26 | Discharge: 2020-01-26 | Disposition: A | Discharge status: Home / Self Care | Payer: Federal, State, Local not specified - PPO | Source: Ambulatory Visit | Attending: Family Medicine | Admitting: Family Medicine

## 2020-01-26 DIAGNOSIS — Z1231 Encounter for screening mammogram for malignant neoplasm of breast: Secondary | ICD-10-CM

## 2020-01-27 LAB — FACTOR 5 LEIDEN

## 2020-01-27 LAB — PROTHROMBIN GENE MUTATION

## 2020-01-29 ENCOUNTER — Encounter: Payer: Self-pay | Admitting: Family

## 2020-02-04 ENCOUNTER — Encounter: Payer: Self-pay | Admitting: Family

## 2020-02-04 ENCOUNTER — Other Ambulatory Visit: Payer: Self-pay | Admitting: Family

## 2020-02-04 DIAGNOSIS — H43393 Other vitreous opacities, bilateral: Secondary | ICD-10-CM | POA: Diagnosis not present

## 2020-02-04 DIAGNOSIS — H33011 Retinal detachment with single break, right eye: Secondary | ICD-10-CM | POA: Diagnosis not present

## 2020-02-09 ENCOUNTER — Encounter: Payer: Self-pay | Admitting: Family Medicine

## 2020-02-10 ENCOUNTER — Other Ambulatory Visit: Payer: Self-pay | Admitting: Family Medicine

## 2020-02-10 DIAGNOSIS — R7402 Elevation of levels of lactic acid dehydrogenase (LDH): Secondary | ICD-10-CM

## 2020-02-10 DIAGNOSIS — I10 Essential (primary) hypertension: Secondary | ICD-10-CM

## 2020-02-10 DIAGNOSIS — E782 Mixed hyperlipidemia: Secondary | ICD-10-CM

## 2020-02-10 DIAGNOSIS — R739 Hyperglycemia, unspecified: Secondary | ICD-10-CM

## 2020-02-10 NOTE — Telephone Encounter (Signed)
Called patient and labs were scheduled for 02/29/20@8  am

## 2020-02-16 DIAGNOSIS — H33011 Retinal detachment with single break, right eye: Secondary | ICD-10-CM | POA: Diagnosis not present

## 2020-02-16 DIAGNOSIS — H43393 Other vitreous opacities, bilateral: Secondary | ICD-10-CM | POA: Diagnosis not present

## 2020-02-23 ENCOUNTER — Other Ambulatory Visit: Payer: Self-pay | Admitting: Family Medicine

## 2020-02-29 ENCOUNTER — Other Ambulatory Visit: Payer: Self-pay

## 2020-02-29 ENCOUNTER — Other Ambulatory Visit (INDEPENDENT_AMBULATORY_CARE_PROVIDER_SITE_OTHER): Payer: Federal, State, Local not specified - PPO

## 2020-02-29 DIAGNOSIS — E782 Mixed hyperlipidemia: Secondary | ICD-10-CM

## 2020-02-29 DIAGNOSIS — R739 Hyperglycemia, unspecified: Secondary | ICD-10-CM | POA: Diagnosis not present

## 2020-02-29 DIAGNOSIS — I10 Essential (primary) hypertension: Secondary | ICD-10-CM

## 2020-02-29 DIAGNOSIS — R7402 Elevation of levels of lactic acid dehydrogenase (LDH): Secondary | ICD-10-CM

## 2020-03-03 ENCOUNTER — Other Ambulatory Visit: Payer: Self-pay

## 2020-03-03 ENCOUNTER — Ambulatory Visit: Payer: Federal, State, Local not specified - PPO | Admitting: Family Medicine

## 2020-03-03 ENCOUNTER — Encounter: Payer: Self-pay | Admitting: Family Medicine

## 2020-03-03 VITALS — BP 124/72 | HR 78 | Temp 98.2°F | Resp 13 | Ht 63.0 in | Wt 243.9 lb

## 2020-03-03 DIAGNOSIS — Z23 Encounter for immunization: Secondary | ICD-10-CM | POA: Diagnosis not present

## 2020-03-03 DIAGNOSIS — R739 Hyperglycemia, unspecified: Secondary | ICD-10-CM | POA: Diagnosis not present

## 2020-03-03 DIAGNOSIS — E538 Deficiency of other specified B group vitamins: Secondary | ICD-10-CM | POA: Diagnosis not present

## 2020-03-03 DIAGNOSIS — R002 Palpitations: Secondary | ICD-10-CM

## 2020-03-03 DIAGNOSIS — I10 Essential (primary) hypertension: Secondary | ICD-10-CM

## 2020-03-03 DIAGNOSIS — F419 Anxiety disorder, unspecified: Secondary | ICD-10-CM | POA: Diagnosis not present

## 2020-03-03 DIAGNOSIS — E782 Mixed hyperlipidemia: Secondary | ICD-10-CM

## 2020-03-03 DIAGNOSIS — E559 Vitamin D deficiency, unspecified: Secondary | ICD-10-CM

## 2020-03-03 DIAGNOSIS — E6609 Other obesity due to excess calories: Secondary | ICD-10-CM

## 2020-03-03 DIAGNOSIS — F32A Depression, unspecified: Secondary | ICD-10-CM

## 2020-03-03 DIAGNOSIS — Z6839 Body mass index (BMI) 39.0-39.9, adult: Secondary | ICD-10-CM

## 2020-03-03 NOTE — Patient Instructions (Signed)
DASH Eating Plan DASH stands for "Dietary Approaches to Stop Hypertension." The DASH eating plan is a healthy eating plan that has been shown to reduce high blood pressure (hypertension). It may also reduce your risk for type 2 diabetes, heart disease, and stroke. The DASH eating plan may also help with weight loss. What are tips for following this plan?  General guidelines  Avoid eating more than 2,300 mg (milligrams) of salt (sodium) a day. If you have hypertension, you may need to reduce your sodium intake to 1,500 mg a day.  Limit alcohol intake to no more than 1 drink a day for nonpregnant women and 2 drinks a day for men. One drink equals 12 oz of beer, 5 oz of wine, or 1 oz of hard liquor.  Work with your health care provider to maintain a healthy body weight or to lose weight. Ask what an ideal weight is for you.  Get at least 30 minutes of exercise that causes your heart to beat faster (aerobic exercise) most days of the week. Activities may include walking, swimming, or biking.  Work with your health care provider or diet and nutrition specialist (dietitian) to adjust your eating plan to your individual calorie needs. Reading food labels   Check food labels for the amount of sodium per serving. Choose foods with less than 5 percent of the Daily Value of sodium. Generally, foods with less than 300 mg of sodium per serving fit into this eating plan.  To find whole grains, look for the word "whole" as the first word in the ingredient list. Shopping  Buy products labeled as "low-sodium" or "no salt added."  Buy fresh foods. Avoid canned foods and premade or frozen meals. Cooking  Avoid adding salt when cooking. Use salt-free seasonings or herbs instead of table salt or sea salt. Check with your health care provider or pharmacist before using salt substitutes.  Do not fry foods. Cook foods using healthy methods such as baking, boiling, grilling, and broiling instead.  Cook with  heart-healthy oils, such as olive, canola, soybean, or sunflower oil. Meal planning  Eat a balanced diet that includes: ? 5 or more servings of fruits and vegetables each day. At each meal, try to fill half of your plate with fruits and vegetables. ? Up to 6-8 servings of whole grains each day. ? Less than 6 oz of lean meat, poultry, or fish each day. A 3-oz serving of meat is about the same size as a deck of cards. One egg equals 1 oz. ? 2 servings of low-fat dairy each day. ? A serving of nuts, seeds, or beans 5 times each week. ? Heart-healthy fats. Healthy fats called Omega-3 fatty acids are found in foods such as flaxseeds and coldwater fish, like sardines, salmon, and mackerel.  Limit how much you eat of the following: ? Canned or prepackaged foods. ? Food that is high in trans fat, such as fried foods. ? Food that is high in saturated fat, such as fatty meat. ? Sweets, desserts, sugary drinks, and other foods with added sugar. ? Full-fat dairy products.  Do not salt foods before eating.  Try to eat at least 2 vegetarian meals each week.  Eat more home-cooked food and less restaurant, buffet, and fast food.  When eating at a restaurant, ask that your food be prepared with less salt or no salt, if possible. What foods are recommended? The items listed may not be a complete list. Talk with your dietitian about   what dietary choices are best for you. Grains Whole-grain or whole-wheat bread. Whole-grain or whole-wheat pasta. Brown rice. Oatmeal. Quinoa. Bulgur. Whole-grain and low-sodium cereals. Pita bread. Low-fat, low-sodium crackers. Whole-wheat flour tortillas. Vegetables Fresh or frozen vegetables (raw, steamed, roasted, or grilled). Low-sodium or reduced-sodium tomato and vegetable juice. Low-sodium or reduced-sodium tomato sauce and tomato paste. Low-sodium or reduced-sodium canned vegetables. Fruits All fresh, dried, or frozen fruit. Canned fruit in natural juice (without  added sugar). Meat and other protein foods Skinless chicken or turkey. Ground chicken or turkey. Pork with fat trimmed off. Fish and seafood. Egg whites. Dried beans, peas, or lentils. Unsalted nuts, nut butters, and seeds. Unsalted canned beans. Lean cuts of beef with fat trimmed off. Low-sodium, lean deli meat. Dairy Low-fat (1%) or fat-free (skim) milk. Fat-free, low-fat, or reduced-fat cheeses. Nonfat, low-sodium ricotta or cottage cheese. Low-fat or nonfat yogurt. Low-fat, low-sodium cheese. Fats and oils Soft margarine without trans fats. Vegetable oil. Low-fat, reduced-fat, or light mayonnaise and salad dressings (reduced-sodium). Canola, safflower, olive, soybean, and sunflower oils. Avocado. Seasoning and other foods Herbs. Spices. Seasoning mixes without salt. Unsalted popcorn and pretzels. Fat-free sweets. What foods are not recommended? The items listed may not be a complete list. Talk with your dietitian about what dietary choices are best for you. Grains Baked goods made with fat, such as croissants, muffins, or some breads. Dry pasta or rice meal packs. Vegetables Creamed or fried vegetables. Vegetables in a cheese sauce. Regular canned vegetables (not low-sodium or reduced-sodium). Regular canned tomato sauce and paste (not low-sodium or reduced-sodium). Regular tomato and vegetable juice (not low-sodium or reduced-sodium). Pickles. Olives. Fruits Canned fruit in a light or heavy syrup. Fried fruit. Fruit in cream or butter sauce. Meat and other protein foods Fatty cuts of meat. Ribs. Fried meat. Bacon. Sausage. Bologna and other processed lunch meats. Salami. Fatback. Hotdogs. Bratwurst. Salted nuts and seeds. Canned beans with added salt. Canned or smoked fish. Whole eggs or egg yolks. Chicken or turkey with skin. Dairy Whole or 2% milk, cream, and half-and-half. Whole or full-fat cream cheese. Whole-fat or sweetened yogurt. Full-fat cheese. Nondairy creamers. Whipped toppings.  Processed cheese and cheese spreads. Fats and oils Butter. Stick margarine. Lard. Shortening. Ghee. Bacon fat. Tropical oils, such as coconut, palm kernel, or palm oil. Seasoning and other foods Salted popcorn and pretzels. Onion salt, garlic salt, seasoned salt, table salt, and sea salt. Worcestershire sauce. Tartar sauce. Barbecue sauce. Teriyaki sauce. Soy sauce, including reduced-sodium. Steak sauce. Canned and packaged gravies. Fish sauce. Oyster sauce. Cocktail sauce. Horseradish that you find on the shelf. Ketchup. Mustard. Meat flavorings and tenderizers. Bouillon cubes. Hot sauce and Tabasco sauce. Premade or packaged marinades. Premade or packaged taco seasonings. Relishes. Regular salad dressings. Where to find more information:  National Heart, Lung, and Blood Institute: www.nhlbi.nih.gov  American Heart Association: www.heart.org Summary  The DASH eating plan is a healthy eating plan that has been shown to reduce high blood pressure (hypertension). It may also reduce your risk for type 2 diabetes, heart disease, and stroke.  With the DASH eating plan, you should limit salt (sodium) intake to 2,300 mg a day. If you have hypertension, you may need to reduce your sodium intake to 1,500 mg a day.  When on the DASH eating plan, aim to eat more fresh fruits and vegetables, whole grains, lean proteins, low-fat dairy, and heart-healthy fats.  Work with your health care provider or diet and nutrition specialist (dietitian) to adjust your eating plan to your   individual calorie needs. This information is not intended to replace advice given to you by your health care provider. Make sure you discuss any questions you have with your health care provider. Document Revised: 04/26/2017 Document Reviewed: 05/07/2016 Elsevier Patient Education  2020 [employer]  

## 2020-03-04 LAB — INSULIN, RANDOM: Insulin: 18.6 u[IU]/mL

## 2020-03-04 LAB — VITAMIN B12: Vitamin B-12: 421 pg/mL (ref 200–1100)

## 2020-03-04 LAB — VITAMIN D 25 HYDROXY (VIT D DEFICIENCY, FRACTURES): Vit D, 25-Hydroxy: 36 ng/mL (ref 30–100)

## 2020-03-05 LAB — COMPREHENSIVE METABOLIC PANEL
AG Ratio: 1.4 (calc) (ref 1.0–2.5)
ALT: 24 U/L (ref 6–29)
AST: 18 U/L (ref 10–35)
Albumin: 3.8 g/dL (ref 3.6–5.1)
Alkaline phosphatase (APISO): 59 U/L (ref 31–125)
BUN: 11 mg/dL (ref 7–25)
CO2: 25 mmol/L (ref 20–32)
Calcium: 9.2 mg/dL (ref 8.6–10.2)
Chloride: 102 mmol/L (ref 98–110)
Creat: 0.67 mg/dL (ref 0.50–1.10)
Globulin: 2.7 g/dL (calc) (ref 1.9–3.7)
Glucose, Bld: 115 mg/dL — ABNORMAL HIGH (ref 65–99)
Potassium: 3.8 mmol/L (ref 3.5–5.3)
Sodium: 138 mmol/L (ref 135–146)
Total Bilirubin: 0.5 mg/dL (ref 0.2–1.2)
Total Protein: 6.5 g/dL (ref 6.1–8.1)

## 2020-03-05 LAB — CBC
HCT: 34.2 % — ABNORMAL LOW (ref 35.0–45.0)
Hemoglobin: 11.5 g/dL — ABNORMAL LOW (ref 11.7–15.5)
MCH: 27.6 pg (ref 27.0–33.0)
MCHC: 33.6 g/dL (ref 32.0–36.0)
MCV: 82 fL (ref 80.0–100.0)
MPV: 10.1 fL (ref 7.5–12.5)
Platelets: 261 10*3/uL (ref 140–400)
RBC: 4.17 10*6/uL (ref 3.80–5.10)
RDW: 13.7 % (ref 11.0–15.0)
WBC: 6.1 10*3/uL (ref 3.8–10.8)

## 2020-03-05 LAB — LIPID PANEL
Cholesterol: 155 mg/dL (ref <200)
HDL: 71 mg/dL (ref >=50)
LDL Cholesterol (Calc): 65 mg/dL (calc)
Non-HDL Cholesterol (Calc): 84 mg/dL (calc) (ref <130)
Total CHOL/HDL Ratio: 2.2 (calc) (ref <5.0)
Triglycerides: 107 mg/dL (ref <150)

## 2020-03-05 LAB — HEMOGLOBIN A1C
Hgb A1c MFr Bld: 6.1 % of total Hgb — ABNORMAL HIGH (ref <5.7)
Mean Plasma Glucose: 128 (calc)
eAG (mmol/L): 7.1 (calc)

## 2020-03-05 LAB — LACTATE DEHYDROGENASE, ISOENZYMES
LD 1: 23 % (ref 19–38)
LD 2: 33 % (ref 30–43)
LD 3: 22 % (ref 16–26)
LD 4: 10 % (ref 3–12)
LD 5: 12 % (ref 3–14)
LDH: 177 U/L (ref 100–200)

## 2020-03-05 LAB — TSH: TSH: 2.7 mIU/L

## 2020-03-07 NOTE — Assessment & Plan Note (Signed)
Encouraged heart healthy diet, increase exercise, avoid trans fats, consider a krill oil cap daily 

## 2020-03-07 NOTE — Assessment & Plan Note (Signed)
Well controlled, no changes to meds. Encouraged heart healthy diet such as the DASH diet and exercise as tolerated.  °

## 2020-03-07 NOTE — Assessment & Plan Note (Signed)
Supplement and monitor 

## 2020-03-07 NOTE — Progress Notes (Signed)
Subjective:    Patient ID: Julie Bishop, female    DOB: [birth year], 49 y.o.   MRN: [id number]  Chief Complaint  Patient presents with  . followup-6 months    HPI Patient is in today for follow up on chronic medical concerns. No recent febrile illness or hospitalizations. No polyuria or polydipsia. She is trying to maintain a heart healthy diet. Stays busy but not with regular exercise. Denies CP/palp/SOB/HA/congestion/fevers/GI or GU c/o. Taking meds as prescribed  Past Medical History:  Diagnosis Date  . Adjustment disorder 01/25/2017  . Anemia   . Anxiety and depression   . Back pain   . Cancer ([place]) 2008   skin cancer, BCC  . Constipation 11/14/2014  . Depression   . Generalized anxiety disorder   . GERD (gastroesophageal reflux disease)   . Headache 08/24/2015  . History of cardiovascular stress test    GXT 3/19: No ischemic ECG changes  . History of echocardiogram    Echo 2/18:  EF 50-55, GLS -20% (normal), normal wall motion, grade 1 diastolic dysfunction, trivial MR, mild RVE, normal RV SF, trivial TR, PASP 23  . Hyperlipidemia    Elevated, but never taken medication  . Hypertension   . Insomnia 08/24/2015  . Obesity 11/14/2014  . OSA (obstructive sleep apnea) 09/12/2016  . Pain in joint, shoulder region 11/27/2015  . Palpitations   . Post-menopause 08/23/2015  . Raynauds syndrome   . Sleep apnea    appt with pulmonary Dr in march 2018  . Vitamin B12 deficiency 08/23/2015  . Vitamin D deficiency 08/23/2015    Past Surgical History:  Procedure Laterality Date  . ABDOMINAL HYSTERECTOMY  2010  . BREAST BIOPSY     right, 1996  . ECTOPIC PREGNANCY SURGERY    . SHOULDER OPEN ROTATOR CUFF REPAIR  2017   right  . TUBAL LIGATION     on right  . TYMPANOSTOMY TUBE PLACEMENT      Family History  Problem Relation Age of Onset  . Diabetes Mother   . Depression Mother   . Hypertension Mother   . Hyperlipidemia Mother   . Liver disease Mother   . Sleep apnea  Mother   . Obesity Mother   . Heart attack Mother 23       CAD w/MI in mid 35s; s/p PCI  . Diabetes Father   . Hypertension Father   . Cancer Maternal Grandmother        throat  . Heart attack Maternal Grandmother   . Cancer Paternal Grandmother        lung  . Heart disease Paternal Grandmother   . Hypertension Sister        as a teenager    Social History   Socioeconomic History  . Marital status: Married    Spouse name: Hetty Linhart  . Number of children: 1  . Years of education: 9  . Highest education level: Not on file  Occupational History  . 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[employer]  Tobacco Use  . Smoking status: Never Smoker  . Smokeless tobacco: Never Used  Vaping Use  . Vaping Use: Never used  Substance and Sexual Activity  . Alcohol use: No    Alcohol/week: 0.0 standard drinks  . Drug use: No  . 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  Not on file  Social History Narrative  . Not on file   Social Determinants of Health   Financial Resource Strain:   . Difficulty of Paying Living Expenses: Not on file  Food Insecurity:   . Worried About Charity fundraiser in the Last Year: Not on file  . Ran Out of Food in the Last Year: Not on file  Transportation Needs:   . Lack of Transportation (Medical): Not on file  . Lack of Transportation (Non-Medical): Not on file  Physical Activity:   . Days of Exercise per Week: Not on file  . Minutes of Exercise per Session: Not on file  Stress:   . Feeling of Stress : Not on file  Social Connections:   . Frequency of Communication with Friends and Family: Not on file  . Frequency of Social Gatherings with Friends and Family: Not on file  . Attends Religious Services: Not on file  . Active Member of Clubs or Organizations: Not on file  . Attends  Archivist Meetings: Not on file  . Marital Status: Not on file  Intimate Partner Violence:   . Fear of Current or Ex-Partner: Not on file  . Emotionally Abused: Not on file  . Physically Abused: Not on file  . Sexually Abused: Not on file    Outpatient Medications Prior to Visit  Medication Sig Dispense Refill  . acetaminophen (TYLENOL) 500 MG tablet Take 1,000 mg by mouth every 6 (six) hours as needed.     Marland Kitchen apixaban (ELIQUIS) 5 MG TABS tablet Take 1 tablet (5 mg total) by mouth 2 (two) times daily. 60 tablet 5  . APIXABAN (ELIQUIS) VTE STARTER PACK (10MG  AND 5MG ) Take as directed on package: start with two-5mg  tablets twice daily for 7 days. On day 8, switch to one-5mg  tablet twice daily. 1 each 0  . atorvastatin (LIPITOR) 10 MG tablet Take 1 tablet (10 mg total) by mouth daily. 90 tablet 3  . cholecalciferol (VITAMIN D3) 25 MCG (1000 UNIT) tablet Take 2,000 Units by mouth daily.    . DULoxetine (CYMBALTA) 30 MG capsule Take 1 capsule (30 mg total) by mouth 3 (three) times daily. 270 capsule 1  . Enalapril-hydroCHLOROthiazide 5-12.5 MG tablet TAKE ONE TABLET BY MOUTH DAILY 90 tablet 1  . LORazepam (ATIVAN) 0.5 MG tablet TAKE ONE TABLET BY MOUTH TWICE A DAY AS NEEDED FOR ANXIETY 30 tablet 1  . metoprolol succinate (TOPROL-XL) 50 MG 24 hr tablet Take 1 tablet (50 mg total) by mouth 2 (two) times daily. Take with or immediately following a meal. 180 tablet 1  . prednisoLONE acetate (PRED FORTE) 1 % ophthalmic suspension     . Vitamin D, Ergocalciferol, (DRISDOL) 1.25 MG (50000 UNIT) CAPS capsule Take 50,000 Units by mouth once a week.     No facility-administered medications prior to visit.    Allergies  Allergen Reactions  . Losartan Other (See Comments)    Diarrhea and abodminal cramping  . Oxycodone Itching    Review of Systems  Constitutional: Negative for fever.  HENT: Negative for congestion.   Eyes: Negative for blurred vision.  Respiratory: Negative for cough.     Cardiovascular: Negative for chest pain and palpitations.  Gastrointestinal: Negative for vomiting.  Musculoskeletal: Negative for back pain.  Skin: Negative for rash.  Neurological: Negative for loss of consciousness and headaches.       Objective:    Physical Exam Vitals and nursing note reviewed.  Constitutional:  General: She is not in acute distress.    Appearance: She is well-developed.  HENT:     Head: Normocephalic and atraumatic.     Nose: Nose normal.  Eyes:     General:        Right eye: No discharge.        Left eye: No discharge.  Cardiovascular:     Rate and Rhythm: Normal rate and regular rhythm.     Heart sounds: No murmur heard.   Pulmonary:     Effort: Pulmonary effort is normal.     Breath sounds: Normal breath sounds.  Abdominal:     General: Bowel sounds are normal.     Palpations: Abdomen is soft.     Tenderness: There is no abdominal tenderness.  Musculoskeletal:     Cervical back: Normal range of motion and neck supple.  Skin:    General: Skin is warm and dry.  Neurological:     Mental Status: She is alert and oriented to person, place, and time.     BP 124/72 (BP Location: Right Arm, Patient Position: Sitting, Cuff Size: Large)   Pulse 78   Temp 98.2 F (36.8 C) (Oral)   Resp 13   Ht 5\' 3"  (1.6 m)   Wt 243 lb 14.4 oz (110.6 kg)   SpO2 99%   BMI 43.20 kg/m  Wt Readings from Last 3 Encounters:  03/03/20 243 lb 14.4 oz (110.6 kg)  01/21/20 240 lb 6.4 oz (109 kg)  12/30/19 235 lb 6.4 oz (106.8 kg)    Diabetic Foot Exam - Simple   No data filed     Lab Results  Component Value Date   WBC 6.1 02/29/2020   HGB 11.5 (L) 02/29/2020   HCT 34.2 (L) 02/29/2020   PLT 261 02/29/2020   GLUCOSE 115 (H) 02/29/2020   CHOL 155 02/29/2020   TRIG 107 02/29/2020   HDL 71 02/29/2020   LDLCALC 65 02/29/2020   ALT 24 02/29/2020   AST 18 02/29/2020   NA 138 02/29/2020   K 3.8 02/29/2020   CL 102 02/29/2020   CREATININE 0.67 02/29/2020    BUN 11 02/29/2020   CO2 25 02/29/2020   TSH 2.70 02/29/2020   HGBA1C 6.1 (H) 02/29/2020    Lab Results  Component Value Date   TSH 2.70 02/29/2020   Lab Results  Component Value Date   WBC 6.1 02/29/2020   HGB 11.5 (L) 02/29/2020   HCT 34.2 (L) 02/29/2020   MCV 82.0 02/29/2020   PLT 261 02/29/2020   Lab Results  Component Value Date   NA 138 02/29/2020   K 3.8 02/29/2020   CO2 25 02/29/2020   GLUCOSE 115 (H) 02/29/2020   BUN 11 02/29/2020   CREATININE 0.67 02/29/2020   BILITOT 0.5 02/29/2020   ALKPHOS 62 01/21/2020   AST 18 02/29/2020   ALT 24 02/29/2020   PROT 6.5 02/29/2020   ALBUMIN 4.1 01/21/2020   CALCIUM 9.2 02/29/2020   ANIONGAP 7 01/21/2020   GFR 102.63 07/09/2019   Lab Results  Component Value Date   CHOL 155 02/29/2020   Lab Results  Component Value Date   HDL 71 02/29/2020   Lab Results  Component Value Date   LDLCALC 65 02/29/2020   Lab Results  Component Value Date   TRIG 107 02/29/2020   Lab Results  Component Value Date   CHOLHDL 2.2 02/29/2020   Lab Results  Component Value Date   HGBA1C 6.1 (H) 02/29/2020  Assessment & Plan:   Problem List Items Addressed This Visit    Anxiety and depression   Hyperlipidemia    Encouraged heart healthy diet, increase exercise, avoid trans fats, consider a krill oil cap daily      Relevant Orders   Lipid panel   Hypertension    Well controlled, no changes to meds. Encouraged heart healthy diet such as the DASH diet and exercise as tolerated.       Relevant Orders   CBC   TSH   Obesity    Encouraged DASH or MIND diet, decrease po intake and increase exercise as tolerated. Needs 7-8 hours of sleep nightly. Avoid trans fats, eat small, frequent meals every 4-5 hours with lean proteins, complex carbs and healthy fats. Minimize simple carbs, referred to nutritionist for further intervention       Relevant Orders   Amb ref to Medical Nutrition Therapy-MNT   Vitamin D deficiency     Supplement and monitor      Relevant Orders   VITAMIN D 25 Hydroxy (Vit-D Deficiency, Fractures) (Completed)   VITAMIN D 25 Hydroxy (Vit-D Deficiency, Fractures)   Vitamin B12 deficiency    Supplement and monitor      Relevant Orders   Vitamin B12 (Completed)   Vitamin B12   Hyperglycemia    hgba1c acceptable, minimize simple carbs. Increase exercise as tolerated      Relevant Orders   Insulin, random (Completed)   Amb ref to Medical Nutrition Therapy-MNT   Insulin, Free (Bioactive)-([facility])   Hemoglobin A1c   Comprehensive metabolic panel   Palpitations    Other Visit Diagnoses    Influenza vaccine needed    -  Primary   Relevant Orders   Flu Vaccine QUAD 6+ mos PF IM (Fluarix Quad PF) (Completed)      I am having Melisssa L. Hauter maintain her LORazepam, acetaminophen, metoprolol succinate, cholecalciferol, atorvastatin, Enalapril-hydroCHLOROthiazide, Vitamin D (Ergocalciferol), prednisoLONE acetate, Apixaban Starter Pack (10mg  and 5mg ), apixaban, and DULoxetine.  No orders of the defined types were placed in this encounter.    Penni Homans, MD

## 2020-03-07 NOTE — Assessment & Plan Note (Signed)
Encouraged DASH or MIND diet, decrease po intake and increase exercise as tolerated. Needs 7-8 hours of sleep nightly. Avoid trans fats, eat small, frequent meals every 4-5 hours with lean proteins, complex carbs and healthy fats. Minimize simple carbs, referred to nutritionist for further intervention

## 2020-03-07 NOTE — Assessment & Plan Note (Signed)
hgba1c acceptable, minimize simple carbs. Increase exercise as tolerated.  

## 2020-03-09 ENCOUNTER — Encounter: Payer: Self-pay | Admitting: Dietician

## 2020-03-09 ENCOUNTER — Encounter: Payer: Federal, State, Local not specified - PPO | Attending: Family Medicine | Admitting: Dietician

## 2020-03-09 ENCOUNTER — Other Ambulatory Visit: Payer: Self-pay

## 2020-03-09 DIAGNOSIS — R739 Hyperglycemia, unspecified: Secondary | ICD-10-CM | POA: Diagnosis not present

## 2020-03-09 NOTE — Patient Instructions (Signed)
   Include a source of protein with all meals and snacks.... try to focus on incorporating this with breakfast especially.   Aim to include a source of protein, complex carbohydrates, and vegetables with your lunch and dinner meals.   Use snacks as needed throughout the day, aiming for a combination of protein and complex carbohydrates.   Practice mindfulness when eating and when choosing when/what to eat throughout the day... use the activity sheet for help with this.

## 2020-03-09 NOTE — Progress Notes (Signed)
Medical Nutrition Therapy   Primary concerns today: weight management  Referral diagnosis: R73.9- hyperglycemia, E66.09/Z68.67- obesity Preferred learning style: no preference indicated Learning readiness: ready   NUTRITION ASSESSMENT   Anthropometrics  Weight: 242 lbs BMI: 42.9 kg/m  Clinical Medical Hx: skin cancer, constipation, depression, GAD, GERD, hyperlipidemia, HTN, obesity, OSA, sleep apnea, vitamin B12 deficiency, vitamin D deficiency  Medications: see chart Labs: A1c 6.1% (02/29/2020)  Notable Signs/Symptoms: reported feeling hyper- and hypo-glycemic episodes   Lifestyle & Dietary Hx Family hx of diabetes (both parents) and most recent A1c in prediabetes range. States she is currently at her highest weight, has been able to lose weight in the past through nutrition programs. States she followed a cardiologist-led nutrition plan (high protein/ low carb) and did very well with this, losing about 90 lbs over 9 months. Has moved back to [place] and currently works from home, states she has been for the past couple of years which has promoted sedentary behavior and more snacking since at home all day. 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 Typical meal pattern is 2-3 meals per day plus snacks. States her husband may cook meat on the grill with baked potatoes and green veggies. Eats out often, such as Wendy's, [employer]', McDonalds, etc. States she knows how to eat healthy but has trouble with either being "all in" or "all out." States she believes she needs a better mindset surrounding food.    24-Hr Dietary Recall First Meal: 2 pieces toast w/ butter (or frozen waffles w/ syrup)  Snack: keto yogurt (or chips/ cookies)  Second Meal: sandwich on white bread + chips  Snack: popcorn (or candy)  Third Meal: take out [place] food (or [place] fried shrimp)  Snack: ice cream  Beverages: coffee w/ sugar-free creamer, Coke Zero   NUTRITION DIAGNOSIS   Excessive energy intake (NI-1.3) related to increased snacking as evidenced by dietary hx and patient reported weight gain.    NUTRITION INTERVENTION  Nutrition education (E-1) on the following topics:  . General healthful eating for balanced energy and blood sugar control  . Mindful eating tips   Handouts Provided Include   MyPlate & Meal Ideas  Breakfast Ideas  Balanced Snacks  Mindful Meals Activity   Learning Style & Readiness for Change Teaching method utilized: Visual & Auditory  Demonstrated degree of understanding via: Teach Back  Barriers to learning/adherence to lifestyle change: Emotional Eating/Snacking   Goals & Main Points . Include a source of protein with all meals and snacks.... try to focus on incorporating this with breakfast especially.  . Aim to include a source of protein, complex carbohydrates, and vegetables with your lunch and dinner meals.  . Use snacks as needed throughout the day, aiming for a combination of protein and complex carbohydrates.  . Practice mindfulness when eating and when choosing when/what to eat throughout the day... use the activity sheet for help with this.    MONITORING & EVALUATION Dietary intake, weekly physical activity, and goals in 3 months.  Next Steps  Patient is to follow up at [facility] in 3 months for further nutrition education.

## 2020-03-16 ENCOUNTER — Telehealth: Payer: Self-pay | Admitting: Family Medicine

## 2020-03-16 ENCOUNTER — Other Ambulatory Visit: Payer: Self-pay

## 2020-03-16 ENCOUNTER — Other Ambulatory Visit: Payer: Self-pay | Admitting: Family Medicine

## 2020-03-16 DIAGNOSIS — I10 Essential (primary) hypertension: Secondary | ICD-10-CM

## 2020-03-16 MED ORDER — ENALAPRIL-HYDROCHLOROTHIAZIDE 5-12.5 MG PO TABS: ORAL_TABLET | ORAL | 1 refills | Status: DC

## 2020-03-16 MED ORDER — METOPROLOL SUCCINATE ER 50 MG PO TB24: 50.0000 mg | ORAL_TABLET | Freq: Two times a day (BID) | ORAL | 1 refills | Status: DC

## 2020-03-16 MED ORDER — LORAZEPAM 0.5 MG PO TABS
ORAL_TABLET | ORAL | 1 refills | Status: DC
Start: 2020-03-16 — End: 2021-07-31

## 2020-03-16 NOTE — Telephone Encounter (Signed)
Medication:metoprolol succinate (TOPROL-XL) 50 MG 24 hr tablet  Enalapril-hydroCHLOROthiazide 5-12.5 MG tablet  LORazepam (ATIVAN) 0.5 MG tablet      Has the patient contacted their pharmacy? No. (If no, request that the patient contact the pharmacy for the refill.) (If yes, when and what did the pharmacy advise?)  Preferred Pharmacy (with phone number or street name):   [facility], [facility]  [address], [place] [address]  Phone:  [phone] Fax:  [phone]   Agent: Please be advised that RX refills may take up to 3 business days. We ask that you follow-up with your pharmacy.

## 2020-03-16 NOTE — Telephone Encounter (Signed)
Mediaction refill sent to pharmacy

## 2020-03-16 NOTE — Telephone Encounter (Signed)
Need refill for lorazepam.  Other 2 rxs have been sent in.  Last written:04/01/19 Last ov: 03/03/20 Next ov: 06/21/20 Contract: 07/26/17 UDS: 07/26/17

## 2020-03-16 NOTE — Telephone Encounter (Signed)
Done

## 2020-03-22 ENCOUNTER — Other Ambulatory Visit: Payer: Self-pay | Admitting: *Deleted

## 2020-03-22 ENCOUNTER — Inpatient Hospital Stay: Payer: Federal, State, Local not specified - PPO | Attending: Hematology & Oncology

## 2020-03-22 ENCOUNTER — Telehealth: Payer: Self-pay | Admitting: *Deleted

## 2020-03-22 ENCOUNTER — Telehealth: Payer: Self-pay | Admitting: Family

## 2020-03-22 ENCOUNTER — Other Ambulatory Visit: Payer: Self-pay | Admitting: Family

## 2020-03-22 ENCOUNTER — Other Ambulatory Visit: Payer: Self-pay

## 2020-03-22 ENCOUNTER — Encounter: Payer: Self-pay | Admitting: Family

## 2020-03-22 ENCOUNTER — Inpatient Hospital Stay (HOSPITAL_BASED_OUTPATIENT_CLINIC_OR_DEPARTMENT_OTHER): Payer: Federal, State, Local not specified - PPO | Admitting: Family

## 2020-03-22 ENCOUNTER — Ambulatory Visit (HOSPITAL_BASED_OUTPATIENT_CLINIC_OR_DEPARTMENT_OTHER)
Admission: RE | Admit: 2020-03-22 | Discharge: 2020-03-22 | Disposition: A | Discharge status: Home / Self Care | Payer: Federal, State, Local not specified - PPO | Source: Ambulatory Visit | Attending: Family | Admitting: Family

## 2020-03-22 VITALS — BP 142/91 | HR 76 | Temp 98.2°F | Resp 17 | Wt 240.0 lb

## 2020-03-22 DIAGNOSIS — I82402 Acute embolism and thrombosis of unspecified deep veins of left lower extremity: Secondary | ICD-10-CM | POA: Diagnosis not present

## 2020-03-22 DIAGNOSIS — I82442 Acute embolism and thrombosis of left tibial vein: Secondary | ICD-10-CM | POA: Diagnosis not present

## 2020-03-22 DIAGNOSIS — R200 Anesthesia of skin: Secondary | ICD-10-CM | POA: Insufficient documentation

## 2020-03-22 DIAGNOSIS — Z7901 Long term (current) use of anticoagulants: Secondary | ICD-10-CM | POA: Diagnosis not present

## 2020-03-22 DIAGNOSIS — Z0389 Encounter for observation for other suspected diseases and conditions ruled out: Secondary | ICD-10-CM | POA: Diagnosis not present

## 2020-03-22 DIAGNOSIS — D619 Aplastic anemia, unspecified: Secondary | ICD-10-CM

## 2020-03-22 DIAGNOSIS — I7300 Raynaud's syndrome without gangrene: Secondary | ICD-10-CM | POA: Insufficient documentation

## 2020-03-22 DIAGNOSIS — R202 Paresthesia of skin: Secondary | ICD-10-CM | POA: Insufficient documentation

## 2020-03-22 DIAGNOSIS — D508 Other iron deficiency anemias: Secondary | ICD-10-CM

## 2020-03-22 DIAGNOSIS — Z885 Allergy status to narcotic agent status: Secondary | ICD-10-CM | POA: Diagnosis not present

## 2020-03-22 DIAGNOSIS — D649 Anemia, unspecified: Secondary | ICD-10-CM

## 2020-03-22 LAB — CBC WITH DIFFERENTIAL (CANCER CENTER ONLY)
Abs Immature Granulocytes: 0.02 10*3/uL (ref 0.00–0.07)
Basophils Absolute: 0 10*3/uL (ref 0.0–0.1)
Basophils Relative: 1 %
Eosinophils Absolute: 0.1 10*3/uL (ref 0.0–0.5)
Eosinophils Relative: 2 %
HCT: 35 % — ABNORMAL LOW (ref 36.0–46.0)
Hemoglobin: 11.4 g/dL — ABNORMAL LOW (ref 12.0–15.0)
Immature Granulocytes: 0 %
Lymphocytes Relative: 40 %
Lymphs Abs: 2.3 10*3/uL (ref 0.7–4.0)
MCH: 27.4 pg (ref 26.0–34.0)
MCHC: 32.6 g/dL (ref 30.0–36.0)
MCV: 84.1 fL (ref 80.0–100.0)
Monocytes Absolute: 0.4 10*3/uL (ref 0.1–1.0)
Monocytes Relative: 6 %
Neutro Abs: 2.9 10*3/uL (ref 1.7–7.7)
Neutrophils Relative %: 51 %
Platelet Count: 287 10*3/uL (ref 150–400)
RBC: 4.16 MIL/uL (ref 3.87–5.11)
RDW: 14.5 % (ref 11.5–15.5)
WBC Count: 5.8 10*3/uL (ref 4.0–10.5)
nRBC: 0 % (ref 0.0–0.2)

## 2020-03-22 LAB — CMP (CANCER CENTER ONLY)
ALT: 28 U/L (ref 0–44)
AST: 21 U/L (ref 15–41)
Albumin: 4.2 g/dL (ref 3.5–5.0)
Alkaline Phosphatase: 54 U/L (ref 38–126)
Anion gap: 5 (ref 5–15)
BUN: 14 mg/dL (ref 6–20)
CO2: 32 mmol/L (ref 22–32)
Calcium: 10 mg/dL (ref 8.9–10.3)
Chloride: 103 mmol/L (ref 98–111)
Creatinine: 0.77 mg/dL (ref 0.44–1.00)
GFR, Estimated: >60 mL/min (ref >60)
Glucose, Bld: 98 mg/dL (ref 70–99)
Potassium: 4.3 mmol/L (ref 3.5–5.1)
Sodium: 140 mmol/L (ref 135–145)
Total Bilirubin: 0.3 mg/dL (ref 0.3–1.2)
Total Protein: 7.1 g/dL (ref 6.5–8.1)

## 2020-03-22 LAB — IRON AND TIBC
Iron: 43 ug/dL (ref 41–142)
Saturation Ratios: 12 % — ABNORMAL LOW (ref 21–57)
TIBC: 363 ug/dL (ref 236–444)
UIBC: 319 ug/dL (ref 120–384)

## 2020-03-22 LAB — FERRITIN: Ferritin: 57 ng/mL (ref 11–307)

## 2020-03-22 NOTE — Telephone Encounter (Signed)
Notified pt of results. Pt verbalized understanding.no concerns at this time. 

## 2020-03-22 NOTE — Telephone Encounter (Signed)
Appointments scheduled and patient has My Chart Access for follow up appointments per 10/26 los

## 2020-03-22 NOTE — Progress Notes (Signed)
Hematology and Oncology Follow Up Visit  Julie Bishop [id number] [birth year] 49 y.o. 03/22/2020   Principle Diagnosis:  DVT of the left lower extremity  Current Therapy:   Eliquis 5 mg PO BID   Interim History:  Julie Bishop is here today for follow-up. She is doing well and has no complaints at this time.  She is doing well on Eliquis BID and is taking as prescribed.  No episodes of blood loss. No abnormal bruising, no petechiae.  No fever, chills, n/v, cough, rash, dizziness, SOB, chest pain, palpitations, abdominal pain or changes in bowel or bladder habits.  She has occasional puffiness in her ankles when on her feet for an extended period of time.  She gets occasional numbness and tingling in her fingertips associated with Raynaud's.  No falls or syncopal episodes to report.  She has maintained a good appetite and is eating healthier. She admits that she needs to better hydrate throughout the day. Her weight is stable.   ECOG Performance Status: 1 - Symptomatic but completely ambulatory  Medications:  Allergies as of 03/22/2020      Reactions   Losartan Other (See Comments)   Diarrhea and abodminal cramping   Oxycodone Itching      Medication List       Accurate as of March 22, 2020  9:23 AM. If you have any questions, ask your nurse or doctor.        acetaminophen 500 MG tablet Commonly known as: TYLENOL Take 1,000 mg by mouth every 6 (six) hours as needed.   Apixaban Starter Pack (10mg  and 5mg ) Commonly known as: ELIQUIS STARTER PACK Take as directed on package: start with two-5mg  tablets twice daily for 7 days. On day 8, switch to one-5mg  tablet twice daily.   apixaban 5 MG Tabs tablet Commonly known as: ELIQUIS Take 1 tablet (5 mg total) by mouth 2 (two) times daily.   atorvastatin 10 MG tablet Commonly known as: LIPITOR Take 1 tablet (10 mg total) by mouth daily.   cholecalciferol 25 MCG (1000 UNIT) tablet Commonly known as: VITAMIN  D3 Take 2,000 Units by mouth daily.   DULoxetine 30 MG capsule Commonly known as: CYMBALTA Take 1 capsule (30 mg total) by mouth 3 (three) times daily.   Enalapril-hydroCHLOROthiazide 5-12.5 MG tablet TAKE ONE TABLET BY MOUTH DAILY   LORazepam 0.5 MG tablet Commonly known as: ATIVAN TAKE ONE TABLET BY MOUTH TWICE A DAY AS NEEDED FOR ANXIETY   metoprolol succinate 50 MG 24 hr tablet Commonly known as: TOPROL-XL Take 1 tablet (50 mg total) by mouth 2 (two) times daily. Take with or immediately following a meal.   prednisoLONE acetate 1 % ophthalmic suspension Commonly known as: PRED FORTE   Vitamin D (Ergocalciferol) 1.25 MG (50000 UNIT) Caps capsule Commonly known as: DRISDOL Take 50,000 Units by mouth once a week.       Allergies:  Allergies  Allergen Reactions   Losartan Other (See Comments)    Diarrhea and abodminal cramping   Oxycodone Itching    Past Medical History, Surgical history, Social history, and Family History were reviewed and updated.  Review of Systems: All other 10 point review of systems is negative.   Physical Exam:  vitals were not taken for this visit.   Wt Readings from Last 3 Encounters:  03/09/20 242 lb (109.8 kg)  03/03/20 243 lb 14.4 oz (110.6 kg)  01/21/20 240 lb 6.4 oz (109 kg)    Ocular: Sclerae unicteric, pupils equal, round  and reactive to light Ear-nose-throat: Oropharynx clear, dentition fair Lymphatic: No cervical or supraclavicular adenopathy Lungs no rales or rhonchi, good excursion bilaterally Heart regular rate and rhythm, no murmur appreciated Abd soft, nontender, positive bowel sounds MSK no focal spinal tenderness, no joint edema Neuro: non-focal, well-oriented, appropriate affect Breasts: Deferred   Lab Results  Component Value Date   WBC 6.1 02/29/2020   HGB 11.5 (L) 02/29/2020   HCT 34.2 (L) 02/29/2020   MCV 82.0 02/29/2020   PLT 261 02/29/2020   Lab Results  Component Value Date   FERRITIN 44  01/21/2020   IRON 55 01/21/2020   TIBC 353 01/21/2020   UIBC 298 01/21/2020   IRONPCTSAT 15 (L) 01/21/2020   Lab Results  Component Value Date   RBC 4.17 02/29/2020   No results found for: KPAFRELGTCHN, LAMBDASER, KAPLAMBRATIO No results found for: IGGSERUM, IGA, IGMSERUM No results found for: Odetta Pink, SPEI   Chemistry      Component Value Date/Time   NA 138 02/29/2020 0754   NA 138 10/22/2017 0935   K 3.8 02/29/2020 0754   CL 102 02/29/2020 0754   CO2 25 02/29/2020 0754   BUN 11 02/29/2020 0754   BUN 15 10/22/2017 0935   CREATININE 0.67 02/29/2020 0754      Component Value Date/Time   CALCIUM 9.2 02/29/2020 0754   ALKPHOS 62 01/21/2020 0812   AST 18 02/29/2020 0754   AST 15 01/21/2020 0812   ALT 24 02/29/2020 0754   ALT 22 01/21/2020 0812   BILITOT 0.5 02/29/2020 0754   BILITOT 0.5 01/21/2020 0812       Impression and Plan: Julie Bishop is a very pleasant 49 yo caucasian female with new diagnosis of DVT within the posterior tibial vein of the left calf post eye surgery. Hyper coag work up was negative.  She will complete 1 full year of anticoagulation followed by 1 full year of maintenance anticoagulation.  She has an [place] lf the left leg to re-evaluate her DVT later today.  She started taking a multivitamin with iron yesterday. Iron studies are pending.  Follow-up in 4 months.  She was encouraged to contact our office with any questions or concerns.   Laverna Peace, NP 10/26/20219:23 AM

## 2020-03-22 NOTE — Telephone Encounter (Signed)
-----   Message from Eliezer Bottom, NP sent at 03/22/2020 11:40 AM EDT ----- Her DVT has resolved!!!!!! WOOOO HOOO!!!!!!   ----- Message ----- From: Interface, Rad Results In Sent: 03/22/2020  11:04 AM EDT To: Eliezer Bottom, NP

## 2020-03-23 ENCOUNTER — Encounter: Payer: Self-pay | Admitting: *Deleted

## 2020-03-23 ENCOUNTER — Other Ambulatory Visit: Payer: Self-pay | Admitting: Family

## 2020-03-23 ENCOUNTER — Ambulatory Visit (HOSPITAL_BASED_OUTPATIENT_CLINIC_OR_DEPARTMENT_OTHER): Payer: Federal, State, Local not specified - PPO

## 2020-03-23 DIAGNOSIS — D509 Iron deficiency anemia, unspecified: Secondary | ICD-10-CM | POA: Insufficient documentation

## 2020-04-15 DIAGNOSIS — H43813 Vitreous degeneration, bilateral: Secondary | ICD-10-CM | POA: Diagnosis not present

## 2020-04-15 DIAGNOSIS — H43393 Other vitreous opacities, bilateral: Secondary | ICD-10-CM | POA: Diagnosis not present

## 2020-04-25 ENCOUNTER — Other Ambulatory Visit: Payer: Self-pay | Admitting: Family Medicine

## 2020-04-25 ENCOUNTER — Encounter: Payer: Self-pay | Admitting: Family Medicine

## 2020-04-25 ENCOUNTER — Telehealth: Payer: Self-pay | Admitting: Family Medicine

## 2020-04-25 DIAGNOSIS — R350 Frequency of micturition: Secondary | ICD-10-CM

## 2020-04-25 MED ORDER — AMOXICILLIN 500 MG PO CAPS: 500.0000 mg | ORAL_CAPSULE | Freq: Three times a day (TID) | ORAL | 0 refills | Status: DC

## 2020-04-25 NOTE — Telephone Encounter (Signed)
Please let her know I sent in Amoxicillin 500 mg tid x 7 days but I also ordered a UA and culture ideally she should leave a urine sample and then start the antibiotic after that if possible. Also increase water intake and start a probiotic. Finally get her to tell you what her symptoms are.

## 2020-04-25 NOTE — Telephone Encounter (Signed)
Patient states she believes that she has a uti and would like for Dr Charlett Blake to send in a medication to pharmacy

## 2020-04-26 ENCOUNTER — Other Ambulatory Visit (INDEPENDENT_AMBULATORY_CARE_PROVIDER_SITE_OTHER): Payer: Federal, State, Local not specified - PPO

## 2020-04-26 ENCOUNTER — Ambulatory Visit: Payer: Federal, State, Local not specified - PPO | Admitting: Family Medicine

## 2020-04-26 ENCOUNTER — Other Ambulatory Visit: Payer: Self-pay

## 2020-04-26 ENCOUNTER — Other Ambulatory Visit: Payer: Self-pay | Admitting: Family Medicine

## 2020-04-26 DIAGNOSIS — R350 Frequency of micturition: Secondary | ICD-10-CM | POA: Diagnosis not present

## 2020-04-26 DIAGNOSIS — Z1152 Encounter for screening for COVID-19: Secondary | ICD-10-CM | POA: Diagnosis not present

## 2020-04-26 DIAGNOSIS — Z03818 Encounter for observation for suspected exposure to other biological agents ruled out: Secondary | ICD-10-CM | POA: Diagnosis not present

## 2020-04-26 LAB — URINALYSIS
Bilirubin Urine: NEGATIVE
Hgb urine dipstick: NEGATIVE
Ketones, ur: NEGATIVE
Leukocytes,Ua: NEGATIVE
Nitrite: NEGATIVE
Specific Gravity, Urine: 1.02 (ref 1.000–1.030)
Total Protein, Urine: NEGATIVE
Urine Glucose: NEGATIVE
Urobilinogen, UA: 0.2 (ref 0.0–1.0)
pH: 6 (ref 5.0–8.0)

## 2020-04-26 MED ORDER — FLUCONAZOLE 150 MG PO TABS: 150.0000 mg | ORAL_TABLET | ORAL | 0 refills | Status: DC

## 2020-04-26 NOTE — Telephone Encounter (Signed)
Sx: Burning and urgency with urination , frequent dribbling, leaking and lower back pain  Is requesting Diflucan to prevent yeast infection with the use of amoxicillin  Will come in this afternoon to pick up cup for UA

## 2020-04-26 NOTE — Telephone Encounter (Signed)
VM message informing pt that Rx was sent in

## 2020-04-26 NOTE — Telephone Encounter (Signed)
Sent in two Diflucan for her to take weekly for now. Let us know if any concerns.

## 2020-04-27 LAB — URINE CULTURE
MICRO NUMBER:: 11257309
SPECIMEN QUALITY:: ADEQUATE

## 2020-04-28 ENCOUNTER — Encounter: Payer: Self-pay | Admitting: Family Medicine

## 2020-05-05 ENCOUNTER — Other Ambulatory Visit: Payer: Self-pay

## 2020-05-05 ENCOUNTER — Ambulatory Visit: Payer: Federal, State, Local not specified - PPO | Admitting: Family Medicine

## 2020-05-05 VITALS — HR 68 | Temp 98.3°F | Resp 18 | Wt 241.8 lb

## 2020-05-05 DIAGNOSIS — E559 Vitamin D deficiency, unspecified: Secondary | ICD-10-CM

## 2020-05-05 DIAGNOSIS — E538 Deficiency of other specified B group vitamins: Secondary | ICD-10-CM

## 2020-05-05 DIAGNOSIS — R350 Frequency of micturition: Secondary | ICD-10-CM

## 2020-05-05 DIAGNOSIS — R300 Dysuria: Secondary | ICD-10-CM

## 2020-05-05 DIAGNOSIS — R3915 Urgency of urination: Secondary | ICD-10-CM

## 2020-05-05 DIAGNOSIS — E782 Mixed hyperlipidemia: Secondary | ICD-10-CM

## 2020-05-05 DIAGNOSIS — I10 Essential (primary) hypertension: Secondary | ICD-10-CM

## 2020-05-05 DIAGNOSIS — R739 Hyperglycemia, unspecified: Secondary | ICD-10-CM

## 2020-05-05 DIAGNOSIS — D649 Anemia, unspecified: Secondary | ICD-10-CM

## 2020-05-05 DIAGNOSIS — R519 Headache, unspecified: Secondary | ICD-10-CM

## 2020-05-05 LAB — CBC WITH DIFFERENTIAL/PLATELET
Basophils Absolute: 0 10*3/uL (ref 0.0–0.1)
Basophils Relative: 0.4 % (ref 0.0–3.0)
Eosinophils Absolute: 0.2 10*3/uL (ref 0.0–0.7)
Eosinophils Relative: 2.8 % (ref 0.0–5.0)
HCT: 33.8 % — ABNORMAL LOW (ref 36.0–46.0)
Hemoglobin: 11.2 g/dL — ABNORMAL LOW (ref 12.0–15.0)
Lymphocytes Relative: 33.2 % (ref 12.0–46.0)
Lymphs Abs: 2.2 10*3/uL (ref 0.7–4.0)
MCHC: 33.2 g/dL (ref 30.0–36.0)
MCV: 81.5 fl (ref 78.0–100.0)
Monocytes Absolute: 0.4 10*3/uL (ref 0.1–1.0)
Monocytes Relative: 6.3 % (ref 3.0–12.0)
Neutro Abs: 3.7 10*3/uL (ref 1.4–7.7)
Neutrophils Relative %: 57.3 % (ref 43.0–77.0)
Platelets: 277 10*3/uL (ref 150.0–400.0)
RBC: 4.15 Mil/uL (ref 3.87–5.11)
RDW: 16.1 % — ABNORMAL HIGH (ref 11.5–15.5)
WBC: 6.5 10*3/uL (ref 4.0–10.5)

## 2020-05-05 NOTE — Patient Instructions (Signed)
ZHYQMV and Saxenda Urinary Frequency, Adult Urinary frequency means urinating more often than usual. You may urinate every 1-2 hours even though you drink a normal amount of fluid and do not have a bladder infection or condition. Although you urinate more often than normal, the total amount of urine produced in a day is normal. With urinary frequency, you may have an urgent need to urinate often. The stress and anxiety of needing to find a bathroom quickly can make this urge worse. This condition may go away on its own or you may need treatment at home. Home treatment may include bladder training, exercises, taking medicines, or making changes to your diet. Follow these instructions at home: Bladder health   Keep a bladder diary if told by your health care provider. Keep track of: ? What you eat and drink. ? How often you urinate. ? How much you urinate.  Follow a bladder training program if told by your health care provider. This may include: ? Learning to delay going to the bathroom. ? Double urinating (voiding). This helps if you are not completely emptying your bladder. ? Scheduled voiding.  Do Kegel exercises as told by your health care provider. Kegel exercises strengthen the muscles that help control urination, which may help the condition. Eating and drinking  If told by your health care provider, make diet changes, such as: ? Avoiding caffeine. ? Drinking fewer fluids, especially alcohol. ? Not drinking in the evening. ? Avoiding foods or drinks that may irritate the bladder. These include coffee, tea, soda, artificial sweeteners, citrus, tomato-based foods, and chocolate. ? Eating foods that help prevent or ease constipation. Constipation can make this condition worse. Your health care provider may recommend that you:  Drink enough fluid to keep your urine pale yellow.  Take over-the-counter or prescription medicines.  Eat foods that are high in fiber, such as beans, whole  grains, and fresh fruits and vegetables.  Limit foods that are high in fat and processed sugars, such as fried or sweet foods. General instructions  Take over-the-counter and prescription medicines only as told by your health care provider.  Keep all follow-up visits as told by your health care provider. This is important. Contact a health care provider if:  You start urinating more often.  You feel pain or irritation when you urinate.  You notice blood in your urine.  Your urine looks cloudy.  You develop a fever.  You begin vomiting. Get help right away if:  You are unable to urinate. Summary  Urinary frequency means urinating more often than usual. With urinary frequency, you may urinate every 1-2 hours even though you drink a normal amount of fluid and do not have a bladder infection or other bladder condition.  Your health care provider may recommend that you keep a bladder diary, follow a bladder training program, or make dietary changes.  If told by your health care provider, do Kegel exercises to strengthen the muscles that help control urination.  Take over-the-counter and prescription medicines only as told by your health care provider.  Contact a health care provider if your symptoms do not improve or get worse. This information is not intended to replace advice given to you by your health care provider. Make sure you discuss any questions you have with your health care provider. Document Revised: 11/21/2017 Document Reviewed: 11/21/2017 Elsevier Patient Education  [place].

## 2020-05-06 ENCOUNTER — Other Ambulatory Visit: Payer: Self-pay | Admitting: *Deleted

## 2020-05-06 MED ORDER — FERROUS FUMARATE-FOLIC ACID 324-1 MG PO TABS: 1.0000 | ORAL_TABLET | Freq: Every day | ORAL | 2 refills | Status: DC

## 2020-05-08 NOTE — Assessment & Plan Note (Signed)
Supplement and monitor 

## 2020-05-08 NOTE — Progress Notes (Signed)
Subjective:    Patient ID: Julie Bishop, female    DOB: [birth year], 49 y.o.   MRN: [id number]  Chief Complaint  Patient presents with  . knot in shoulder    Pt has a knot in left shoulder    HPI Patient is in today for follow up on chronic medical concerns. No recent febrile illness or hospitalizations. She is tolerating Eliquis. No bloody or tarry stool. She is noting urinary frequency and urgency. No blood in urine or dysuria. Denies CP/palp/SOB/HA/congestion/fevers/GI c/o. Taking meds as prescribed  Past Medical History:  Diagnosis Date  . Adjustment disorder 01/25/2017  . Anemia   . Anxiety and depression   . Back pain   . Cancer ([place]) 2008   skin cancer, BCC  . Constipation 11/14/2014  . Depression   . Generalized anxiety disorder   . GERD (gastroesophageal reflux disease)   . Headache 08/24/2015  . History of cardiovascular stress test    GXT 3/19: No ischemic ECG changes  . History of echocardiogram    Echo 2/18:  EF 50-55, GLS -20% (normal), normal wall motion, grade 1 diastolic dysfunction, trivial MR, mild RVE, normal RV SF, trivial TR, PASP 23  . Hyperlipidemia    Elevated, but never taken medication  . Hypertension   . Insomnia 08/24/2015  . Obesity 11/14/2014  . OSA (obstructive sleep apnea) 09/12/2016  . Pain in joint, shoulder region 11/27/2015  . Palpitations   . Post-menopause 08/23/2015  . Raynauds syndrome   . Sleep apnea    appt with pulmonary Dr in march 2018  . Vitamin B12 deficiency 08/23/2015  . Vitamin D deficiency 08/23/2015    Past Surgical History:  Procedure Laterality Date  . ABDOMINAL HYSTERECTOMY  2010  . BREAST BIOPSY     right, 1996  . ECTOPIC PREGNANCY SURGERY    . SHOULDER OPEN ROTATOR CUFF REPAIR  2017   right  . TUBAL LIGATION     on right  . TYMPANOSTOMY TUBE PLACEMENT      Family History  Problem Relation Age of Onset  . Diabetes Mother   . Depression Mother   . Hypertension Mother   . Hyperlipidemia Mother   .  Liver disease Mother   . Sleep apnea Mother   . Obesity Mother   . Heart attack Mother 96       CAD w/MI in mid 69s; s/p PCI  . Diabetes Father   . Hypertension Father   . Cancer Maternal Grandmother        throat  . Heart attack Maternal Grandmother   . Cancer Paternal Grandmother        lung  . Heart disease Paternal Grandmother   . Hypertension Sister        as a teenager    Social History   Socioeconomic History  . Marital status: Married    Spouse name: Lashonta Pilling  . Number of children: 1  . Years of education: 29  . Highest education level: Not on file  Occupational History  . 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[employer]  Tobacco Use  . Smoking status: Never Smoker  . Smokeless tobacco: Never Used  Vaping Use  . Vaping Use: Never used  Substance and Sexual Activity  . Alcohol use: No    Alcohol/week: 0.0 standard drinks  . Drug use: No  . 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 Not on file  Social History Narrative  . Not on file   Social Determinants of Health   Financial Resource Strain: Not on file  Food Insecurity: No Food Insecurity  . Worried About Charity fundraiser in the Last Year: Never true  . Ran Out of Food in the Last Year: Never true  Transportation Needs: Not on file  Physical Activity: Not on file  Stress: Not on file  Social Connections: Not on file  Intimate Partner Violence: Not on file    Outpatient Medications Prior to Visit  Medication Sig Dispense Refill  . acetaminophen (TYLENOL) 500 MG tablet Take 1,000 mg by mouth every 6 (six) hours as needed.     . APIXABAN (ELIQUIS) VTE STARTER PACK (10MG  AND 5MG ) Take as directed on package: start with two-5mg  tablets twice daily for 7 days. On day 8, switch to one-5mg  tablet twice daily. 1 each 0  . atorvastatin  (LIPITOR) 10 MG tablet Take 1 tablet (10 mg total) by mouth daily. 90 tablet 3  . cholecalciferol (VITAMIN D3) 25 MCG (1000 UNIT) tablet Take 2,000 Units by mouth daily.    . DULoxetine (CYMBALTA) 30 MG capsule Take 1 capsule (30 mg total) by mouth 3 (three) times daily. 270 capsule 1  . Enalapril-hydroCHLOROthiazide 5-12.5 MG tablet TAKE ONE TABLET BY MOUTH DAILY 90 tablet 1  . fluconazole (DIFLUCAN) 150 MG tablet Take 1 tablet (150 mg total) by mouth once a week. 2 tablet 0  . LORazepam (ATIVAN) 0.5 MG tablet TAKE ONE TABLET BY MOUTH TWICE A DAY AS NEEDED FOR ANXIETY 30 tablet 1  . metoprolol succinate (TOPROL-XL) 50 MG 24 hr tablet Take 1 tablet (50 mg total) by mouth 2 (two) times daily. Take with or immediately following a meal. 180 tablet 1  . Vitamin D, Ergocalciferol, (DRISDOL) 1.25 MG (50000 UNIT) CAPS capsule Take 50,000 Units by mouth once a week.    Marland Kitchen amoxicillin (AMOXIL) 500 MG capsule Take 1 capsule (500 mg total) by mouth 3 (three) times daily. 21 capsule 0  . apixaban (ELIQUIS) 5 MG TABS tablet Take 1 tablet (5 mg total) by mouth 2 (two) times daily. 60 tablet 5  . prednisoLONE acetate (PRED FORTE) 1 % ophthalmic suspension     . predniSONE (DELTASONE) 10 MG tablet Take by mouth.     No facility-administered medications prior to visit.    Allergies  Allergen Reactions  . Losartan Other (See Comments)    Diarrhea and abodminal cramping  . Oxycodone Itching    Review of Systems  Constitutional: Negative for fever and malaise/fatigue.  HENT: Negative for congestion.   Eyes: Negative for blurred vision.  Respiratory: Negative for shortness of breath.   Cardiovascular: Negative for chest pain, palpitations and leg swelling.  Gastrointestinal: Negative for abdominal pain, blood in stool and nausea.  Genitourinary: Negative for dysuria and frequency.  Musculoskeletal: Negative for falls.  Skin: Negative for rash.  Neurological: Negative for dizziness, loss of consciousness  and headaches.  Endo/Heme/Allergies: Negative for environmental allergies.  Psychiatric/Behavioral: Negative for depression. The patient is not nervous/anxious.        Objective:    Physical Exam Vitals and nursing note reviewed.  Constitutional:      General: She is not in acute distress.    Appearance: She is well-developed and well-nourished.  HENT:     Head: Normocephalic and atraumatic.     Nose: Nose  normal.  Eyes:     General:        Right eye: No discharge.        Left eye: No discharge.  Cardiovascular:     Rate and Rhythm: Normal rate and regular rhythm.     Heart sounds: No murmur heard.   Pulmonary:     Effort: Pulmonary effort is normal.     Breath sounds: Normal breath sounds.  Abdominal:     General: Bowel sounds are normal.     Palpations: Abdomen is soft.     Tenderness: There is no abdominal tenderness.  Musculoskeletal:        General: No edema.     Cervical back: Normal range of motion and neck supple.     Comments: 1 cm lymph node over lying left clavicel  Skin:    General: Skin is warm and dry.  Neurological:     Mental Status: She is alert and oriented to person, place, and time.  Psychiatric:        Mood and Affect: Mood and affect normal.     Pulse 68   Temp 98.3 F (36.8 C) (Oral)   Resp 18   Wt 241 lb 12.8 oz (109.7 kg)   SpO2 97%   BMI 42.83 kg/m  Wt Readings from Last 3 Encounters:  05/05/20 241 lb 12.8 oz (109.7 kg)  03/22/20 240 lb (108.9 kg)  03/09/20 242 lb (109.8 kg)    Diabetic Foot Exam - Simple   No data filed    Lab Results  Component Value Date   WBC 6.5 05/05/2020   HGB 11.2 (L) 05/05/2020   HCT 33.8 (L) 05/05/2020   PLT 277.0 05/05/2020   GLUCOSE 98 03/22/2020   CHOL 155 02/29/2020   TRIG 107 02/29/2020   HDL 71 02/29/2020   LDLCALC 65 02/29/2020   ALT 28 03/22/2020   AST 21 03/22/2020   NA 140 03/22/2020   K 4.3 03/22/2020   CL 103 03/22/2020   CREATININE 0.77 03/22/2020   BUN 14 03/22/2020    CO2 32 03/22/2020   TSH 2.70 02/29/2020   HGBA1C 6.1 (H) 02/29/2020    Lab Results  Component Value Date   TSH 2.70 02/29/2020   Lab Results  Component Value Date   WBC 6.5 05/05/2020   HGB 11.2 (L) 05/05/2020   HCT 33.8 (L) 05/05/2020   MCV 81.5 05/05/2020   PLT 277.0 05/05/2020   Lab Results  Component Value Date   NA 140 03/22/2020   K 4.3 03/22/2020   CO2 32 03/22/2020   GLUCOSE 98 03/22/2020   BUN 14 03/22/2020   CREATININE 0.77 03/22/2020   BILITOT 0.3 03/22/2020   ALKPHOS 54 03/22/2020   AST 21 03/22/2020   ALT 28 03/22/2020   PROT 7.1 03/22/2020   ALBUMIN 4.2 03/22/2020   CALCIUM 10.0 03/22/2020   ANIONGAP 5 03/22/2020   GFR 102.63 07/09/2019   Lab Results  Component Value Date   CHOL 155 02/29/2020   Lab Results  Component Value Date   HDL 71 02/29/2020   Lab Results  Component Value Date   LDLCALC 65 02/29/2020   Lab Results  Component Value Date   TRIG 107 02/29/2020   Lab Results  Component Value Date   CHOLHDL 2.2 02/29/2020   Lab Results  Component Value Date   HGBA1C 6.1 (H) 02/29/2020       Assessment & Plan:   Problem List Items Addressed This Visit  Hyperlipidemia    Encouraged heart healthy diet, increase exercise, avoid trans fats, consider a krill oil cap daily      Hypertension    Well controlled, no changes to meds. Encouraged heart healthy diet such as the DASH diet and exercise as tolerated.       Vitamin D deficiency    Supplement and monitor      Vitamin B12 deficiency    Supplement and monitor      Headache disorder    Encouraged increased hydration, 64 ounces of clear fluids daily. Minimize alcohol and caffeine. Eat small frequent meals with lean proteins and complex carbs. Avoid high and low blood sugars. Get adequate sleep, 7-8 hours a night. Needs exercise daily preferably in the morning.      Hyperglycemia    hgba1c acceptable, minimize simple carbs. Increase exercise as tolerated. Continue current  meds       Other Visit Diagnoses    Urinary frequency    -  Primary   Relevant Orders   Ambulatory referral to Urology   Dysuria       Relevant Orders   Ambulatory referral to Urology   Urinary urgency       Relevant Orders   Ambulatory referral to Urology   Anemia, unspecified type       Relevant Orders   CBC with Differential/Platelet (Completed)      I have discontinued Lance L. Wadas's prednisoLONE acetate, apixaban, predniSONE, and amoxicillin. I am also having her maintain her acetaminophen, cholecalciferol, atorvastatin, Vitamin D (Ergocalciferol), Apixaban Starter Pack (10mg  and 5mg ), DULoxetine, metoprolol succinate, Enalapril-hydroCHLOROthiazide, LORazepam, and fluconazole.  No orders of the defined types were placed in this encounter.    Penni Homans, MD

## 2020-05-08 NOTE — Assessment & Plan Note (Signed)
Encouraged heart healthy diet, increase exercise, avoid trans fats, consider a krill oil cap daily 

## 2020-05-08 NOTE — Assessment & Plan Note (Signed)
Well controlled, no changes to meds. Encouraged heart healthy diet such as the DASH diet and exercise as tolerated.  °

## 2020-05-08 NOTE — Assessment & Plan Note (Signed)
hgba1c acceptable, minimize simple carbs. Increase exercise as tolerated. Continue current meds 

## 2020-05-08 NOTE — Assessment & Plan Note (Signed)
Encouraged increased hydration, 64 ounces of clear fluids daily. Minimize alcohol and caffeine. Eat small frequent meals with lean proteins and complex carbs. Avoid high and low blood sugars. Get adequate sleep, 7-8 hours a night. Needs exercise daily preferably in the morning.  

## 2020-05-24 DIAGNOSIS — Z1152 Encounter for screening for COVID-19: Secondary | ICD-10-CM | POA: Diagnosis not present

## 2020-05-24 DIAGNOSIS — Z03818 Encounter for observation for suspected exposure to other biological agents ruled out: Secondary | ICD-10-CM | POA: Diagnosis not present

## 2020-06-03 ENCOUNTER — Other Ambulatory Visit: Payer: Self-pay

## 2020-06-03 ENCOUNTER — Other Ambulatory Visit (INDEPENDENT_AMBULATORY_CARE_PROVIDER_SITE_OTHER): Payer: Federal, State, Local not specified - PPO

## 2020-06-03 DIAGNOSIS — E559 Vitamin D deficiency, unspecified: Secondary | ICD-10-CM

## 2020-06-03 DIAGNOSIS — E782 Mixed hyperlipidemia: Secondary | ICD-10-CM

## 2020-06-03 DIAGNOSIS — R739 Hyperglycemia, unspecified: Secondary | ICD-10-CM

## 2020-06-03 DIAGNOSIS — E538 Deficiency of other specified B group vitamins: Secondary | ICD-10-CM

## 2020-06-03 DIAGNOSIS — I10 Essential (primary) hypertension: Secondary | ICD-10-CM | POA: Diagnosis not present

## 2020-06-08 ENCOUNTER — Ambulatory Visit: Payer: Federal, State, Local not specified - PPO | Admitting: Dietician

## 2020-06-08 LAB — LIPID PANEL
Cholesterol: 143 mg/dL (ref <200)
HDL: 66 mg/dL (ref >=50)
LDL Cholesterol (Calc): 56 mg/dL (calc)
Non-HDL Cholesterol (Calc): 77 mg/dL (calc) (ref <130)
Total CHOL/HDL Ratio: 2.2 (calc) (ref <5.0)
Triglycerides: 131 mg/dL (ref <150)

## 2020-06-08 LAB — COMPREHENSIVE METABOLIC PANEL
AG Ratio: 1.5 (calc) (ref 1.0–2.5)
ALT: 29 U/L (ref 6–29)
AST: 17 U/L (ref 10–35)
Albumin: 4 g/dL (ref 3.6–5.1)
Alkaline phosphatase (APISO): 64 U/L (ref 31–125)
BUN: 10 mg/dL (ref 7–25)
CO2: 27 mmol/L (ref 20–32)
Calcium: 9.4 mg/dL (ref 8.6–10.2)
Chloride: 104 mmol/L (ref 98–110)
Creat: 0.63 mg/dL (ref 0.50–1.10)
Globulin: 2.7 g/dL (calc) (ref 1.9–3.7)
Glucose, Bld: 123 mg/dL — ABNORMAL HIGH (ref 65–99)
Potassium: 4.2 mmol/L (ref 3.5–5.3)
Sodium: 141 mmol/L (ref 135–146)
Total Bilirubin: 0.4 mg/dL (ref 0.2–1.2)
Total Protein: 6.7 g/dL (ref 6.1–8.1)

## 2020-06-08 LAB — CBC
HCT: 33.6 % — ABNORMAL LOW (ref 35.0–45.0)
Hemoglobin: 11.5 g/dL — ABNORMAL LOW (ref 11.7–15.5)
MCH: 28 pg (ref 27.0–33.0)
MCHC: 34.2 g/dL (ref 32.0–36.0)
MCV: 81.8 fL (ref 80.0–100.0)
MPV: 10.5 fL (ref 7.5–12.5)
Platelets: 264 10*3/uL (ref 140–400)
RBC: 4.11 10*6/uL (ref 3.80–5.10)
RDW: 14.8 % (ref 11.0–15.0)
WBC: 6.2 10*3/uL (ref 3.8–10.8)

## 2020-06-08 LAB — VITAMIN D 25 HYDROXY (VIT D DEFICIENCY, FRACTURES): Vit D, 25-Hydroxy: 45 ng/mL (ref 30–100)

## 2020-06-08 LAB — HEMOGLOBIN A1C
Hgb A1c MFr Bld: 6.5 % of total Hgb — ABNORMAL HIGH (ref <5.7)
Mean Plasma Glucose: 140 mg/dL
eAG (mmol/L): 7.7 mmol/L

## 2020-06-08 LAB — TSH: TSH: 2.85 mIU/L

## 2020-06-08 LAB — VITAMIN B12: Vitamin B-12: 294 pg/mL (ref 200–1100)

## 2020-06-08 LAB — INSULIN, FREE (BIOACTIVE): Insulin, Free: 19.7 u[IU]/mL — ABNORMAL HIGH (ref 1.5–14.9)

## 2020-06-16 ENCOUNTER — Other Ambulatory Visit (INDEPENDENT_AMBULATORY_CARE_PROVIDER_SITE_OTHER): Payer: Federal, State, Local not specified - PPO

## 2020-06-16 ENCOUNTER — Encounter: Payer: Self-pay | Admitting: Family Medicine

## 2020-06-16 DIAGNOSIS — D649 Anemia, unspecified: Secondary | ICD-10-CM

## 2020-06-16 LAB — FECAL OCCULT BLOOD, IMMUNOCHEMICAL: Fecal Occult Bld: NEGATIVE

## 2020-06-21 ENCOUNTER — Encounter: Payer: Self-pay | Admitting: Family Medicine

## 2020-06-21 ENCOUNTER — Ambulatory Visit: Payer: Federal, State, Local not specified - PPO | Admitting: Family Medicine

## 2020-06-21 ENCOUNTER — Other Ambulatory Visit: Payer: Self-pay

## 2020-06-21 DIAGNOSIS — I10 Essential (primary) hypertension: Secondary | ICD-10-CM | POA: Diagnosis not present

## 2020-06-21 DIAGNOSIS — Z6839 Body mass index (BMI) 39.0-39.9, adult: Secondary | ICD-10-CM

## 2020-06-21 DIAGNOSIS — E782 Mixed hyperlipidemia: Secondary | ICD-10-CM

## 2020-06-21 DIAGNOSIS — R739 Hyperglycemia, unspecified: Secondary | ICD-10-CM

## 2020-06-21 DIAGNOSIS — E559 Vitamin D deficiency, unspecified: Secondary | ICD-10-CM | POA: Diagnosis not present

## 2020-06-21 DIAGNOSIS — F32A Depression, unspecified: Secondary | ICD-10-CM

## 2020-06-21 DIAGNOSIS — D509 Iron deficiency anemia, unspecified: Secondary | ICD-10-CM

## 2020-06-21 DIAGNOSIS — E538 Deficiency of other specified B group vitamins: Secondary | ICD-10-CM | POA: Diagnosis not present

## 2020-06-21 DIAGNOSIS — E6609 Other obesity due to excess calories: Secondary | ICD-10-CM

## 2020-06-21 DIAGNOSIS — F419 Anxiety disorder, unspecified: Secondary | ICD-10-CM

## 2020-06-21 NOTE — Assessment & Plan Note (Signed)
Well controlled, no changes to meds. Encouraged heart healthy diet such as the DASH diet and exercise as tolerated.  °

## 2020-06-21 NOTE — Patient Instructions (Signed)
LFZZGW and Saxenda for weight loss  MIND diet or Dash diet  Preventive Care 24-50 Years Old, Female Preventive care refers to lifestyle choices and visits with your health care provider that can promote health and wellness. This includes:  A yearly physical exam. This is also called an annual wellness visit.  Regular dental and eye exams.  Immunizations.  Screening for certain conditions.  Healthy lifestyle choices, such as: ? Eating a healthy diet. ? Getting regular exercise. ? Not using drugs or products that contain nicotine and tobacco. ? Limiting alcohol use. What can I expect for my preventive care visit? Physical exam Your health care provider will check your:  Height and weight. These may be used to calculate your BMI (body mass index). BMI is a measurement that tells if you are at a healthy weight.  Heart rate and blood pressure.  Body temperature.  Skin for abnormal spots. Counseling Your health care provider may ask you questions about your:  Past medical problems.  Family's medical history.  Alcohol, tobacco, and drug use.  Emotional well-being.  Home life and relationship well-being.  Sexual activity.  Diet, exercise, and sleep habits.  Work and work Astronomer.  Access to firearms.  Method of birth control.  Menstrual cycle.  Pregnancy history. What immunizations do I need? Vaccines are usually given at various ages, according to a schedule. Your health care provider will recommend vaccines for you based on your age, medical history, and lifestyle or other factors, such as travel or where you work.   What tests do I need? Blood tests  Lipid and cholesterol levels. These may be checked every 5 years, or more often if you are over 29 years old.  Hepatitis C test.  Hepatitis B test. Screening  Lung cancer screening. You may have this screening every year starting at age 48 if you have a 30-pack-year history of smoking and currently  smoke or have quit within the past 15 years.  Colorectal cancer screening. ? All adults should have this screening starting at age 77 and continuing until age 66. ? Your health care provider may recommend screening at age 32 if you are at increased risk. ? You will have tests every 1-10 years, depending on your results and the type of screening test.  Diabetes screening. ? This is done by checking your blood sugar (glucose) after you have not eaten for a while (fasting). ? You may have this done every 1-3 years.  Mammogram. ? This may be done every 1-2 years. ? Talk with your health care provider about when you should start having regular mammograms. This may depend on whether you have a family history of breast cancer.  BRCA-related cancer screening. This may be done if you have a family history of breast, ovarian, tubal, or peritoneal cancers.  Pelvic exam and Pap test. ? This may be done every 3 years starting at age 62. ? Starting at age 42, this may be done every 5 years if you have a Pap test in combination with an HPV test. Other tests  STD (sexually transmitted disease) testing, if you are at risk.  Bone density scan. This is done to screen for osteoporosis. You may have this scan if you are at high risk for osteoporosis. Talk with your health care provider about your test results, treatment options, and if necessary, the need for more tests. Follow these instructions at home: Eating and drinking  Eat a diet that includes fresh fruits and vegetables, whole  grains, lean protein, and low-fat dairy products.  Take vitamin and mineral supplements as recommended by your health care provider.  Do not drink alcohol if: ? Your health care provider tells you not to drink. ? You are pregnant, may be pregnant, or are planning to become pregnant.  If you drink alcohol: ? Limit how much you have to 0-1 drink a day. ? Be aware of how much alcohol is in your drink. In the [place] one  drink equals one 12 oz bottle of beer (355 mL), one 5 oz glass of wine (148 mL), or one 1 oz glass of hard liquor (44 mL).   Lifestyle  Take daily care of your teeth and gums. Brush your teeth every morning and night with fluoride toothpaste. Floss one time each day.  Stay active. Exercise for at least 30 minutes 5 or more days each week.  Do not use any products that contain nicotine or tobacco, such as cigarettes, e-cigarettes, and chewing tobacco. If you need help quitting, ask your health care provider.  Do not use drugs.  If you are sexually active, practice safe sex. Use a condom or other form of protection to prevent STIs (sexually transmitted infections).  If you do not wish to become pregnant, use a form of birth control. If you plan to become pregnant, see your health care provider for a prepregnancy visit.  If told by your health care provider, take low-dose aspirin daily starting at age 87.  Find healthy ways to cope with stress, such as: ? Meditation, yoga, or listening to music. ? Journaling. ? Talking to a trusted person. ? Spending time with friends and family. Safety  Always wear your seat belt while driving or riding in a vehicle.  Do not drive: ? If you have been drinking alcohol. Do not ride with someone who has been drinking. ? When you are tired or distracted. ? While texting.  Wear a helmet and other protective equipment during sports activities.  If you have firearms in your house, make sure you follow all gun safety procedures. What's next?  Visit your health care provider once a year for an annual wellness visit.  Ask your health care provider how often you should have your eyes and teeth checked.  Stay up to date on all vaccines. This information is not intended to replace advice given to you by your health care provider. Make sure you discuss any questions you have with your health care provider. Document Revised: 02/16/2020 Document Reviewed:  01/23/2018 Elsevier Patient Education  2021 [employer].

## 2020-06-21 NOTE — Assessment & Plan Note (Signed)
hgba1c acceptable, minimize simple carbs. Increase exercise as tolerated.  

## 2020-06-21 NOTE — Assessment & Plan Note (Signed)
Supplement and monitor 

## 2020-06-21 NOTE — Telephone Encounter (Signed)
Patient had labs done today

## 2020-06-21 NOTE — Assessment & Plan Note (Signed)
She is struggling with anhedonia she feels largely because of her weight. No change in treatment plan today

## 2020-06-21 NOTE — Assessment & Plan Note (Signed)
Encouraged DASH or MIND diet, decrease po intake and increase exercise as tolerated. Needs 7-8 hours of sleep nightly. Avoid trans fats, eat small, frequent meals every 4-5 hours with lean proteins, complex carbs and healthy fats. Minimize simple carbs, GMO foods. 

## 2020-06-21 NOTE — Assessment & Plan Note (Signed)
Increase leafy greens, consider increased lean red meat and using cast iron cookware. Continue to monitor, report any concerns 

## 2020-06-21 NOTE — Assessment & Plan Note (Signed)
Encouraged heart healthy diet, increase exercise, avoid trans fats, consider a krill oil cap daily. Tolerating Atorvastatin 

## 2020-06-21 NOTE — Progress Notes (Signed)
Subjective:    Patient ID: Julie Bishop, female    DOB: 01/21/1971, 50 y.o.   MRN: [id number]  Chief Complaint  Patient presents with  . 3 MONTH FOLLOW UP    HPI Patient is in today for follow up on chronic medical concerns. No recent febrile illness or hospitalizations. She has started a new job and it has been stressful but she is doing well. She is frustrated with weight gain and she has not been exercising. Denies CP/palp/SOB/HA/congestion/fevers/GI or GU c/o. Taking meds as prescribed  Past Medical History:  Diagnosis Date  . Adjustment disorder 01/25/2017  . Anemia   . Anxiety and depression   . Back pain   . Cancer ([place]) 2008   skin cancer, BCC  . Constipation 11/14/2014  . Depression   . Generalized anxiety disorder   . GERD (gastroesophageal reflux disease)   . Headache 08/24/2015  . History of cardiovascular stress test    GXT 3/19: No ischemic ECG changes  . History of echocardiogram    Echo 2/18:  EF 50-55, GLS -20% (normal), normal wall motion, grade 1 diastolic dysfunction, trivial MR, mild RVE, normal RV SF, trivial TR, PASP 23  . Hyperlipidemia    Elevated, but never taken medication  . Hypertension   . Insomnia 08/24/2015  . Obesity 11/14/2014  . OSA (obstructive sleep apnea) 09/12/2016  . Pain in joint, shoulder region 11/27/2015  . Palpitations   . Post-menopause 08/23/2015  . Raynauds syndrome   . Sleep apnea    appt with pulmonary Dr in march 2018  . Vitamin B12 deficiency 08/23/2015  . Vitamin D deficiency 08/23/2015    Past Surgical History:  Procedure Laterality Date  . ABDOMINAL HYSTERECTOMY  2010  . BREAST BIOPSY     right, 1996  . ECTOPIC PREGNANCY SURGERY    . SHOULDER OPEN ROTATOR CUFF REPAIR  2017   right  . TUBAL LIGATION     on right  . TYMPANOSTOMY TUBE PLACEMENT      Family History  Problem Relation Age of Onset  . Diabetes Mother   . Depression Mother   . Hypertension Mother   . Hyperlipidemia Mother   . Liver disease  Mother   . Sleep apnea Mother   . Obesity Mother   . Heart attack Mother 37       CAD w/MI in mid 65s; s/p PCI  . Diabetes Father   . Hypertension Father   . Cancer Maternal Grandmother        throat  . Heart attack Maternal Grandmother   . Cancer Paternal Grandmother        lung  . Heart disease Paternal Grandmother   . Hypertension Sister        as a teenager    Social History   Socioeconomic History  . Marital status: Married    Spouse name: Julie Bishop  . Number of children: 1  . Years of education: 67  . Highest education level: Not on file  Occupational History  . 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[employer]  Tobacco Use  . Smoking status: Never Smoker  . Smokeless tobacco: Never Used  Vaping Use  . Vaping Use: Never used  Substance and Sexual Activity  . Alcohol use: No    Alcohol/week: 0.0 standard drinks  . Drug use: No  . Sexual activity: Yes    Partners: Male    Birth control/protection: Post-menopausal    Comment: 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[place], minimize carbs., lives with husband  Other Topics Concern  . Not on file  Social History Narrative  . Not on file   Social Determinants of Health   Financial Resource Strain: Not on file  Food Insecurity: No Food Insecurity  . Worried About Charity fundraiser in the Last Year: Never true  . Ran Out of Food in the Last Year: Never true  Transportation Needs: Not on file  Physical Activity: Not on file  Stress: Not on file  Social Connections: Not on file  Intimate Partner Violence: Not on file    Outpatient Medications Prior to Visit  Medication Sig Dispense Refill  . acetaminophen (TYLENOL) 500 MG tablet Take 1,000 mg by mouth every 6 (six) hours as needed.     Marland Kitchen atorvastatin (LIPITOR) 10 MG tablet Take 1 tablet (10 mg total) by mouth daily. 90 tablet 3  . cholecalciferol (VITAMIN D3) 25 MCG (1000 UNIT) tablet Take 2,000 Units by mouth daily.    . DULoxetine (CYMBALTA) 30 MG  capsule Take 1 capsule (30 mg total) by mouth 3 (three) times daily. 270 capsule 1  . ELIQUIS 5 MG TABS tablet Take 5 mg by mouth 2 (two) times daily.    . Enalapril-hydroCHLOROthiazide 5-12.5 MG tablet TAKE ONE TABLET BY MOUTH DAILY 90 tablet 1  . Ferrous Fumarate-Folic Acid 99991111 MG TABS Take 1 tablet by mouth daily. 30 tablet 2  . LORazepam (ATIVAN) 0.5 MG tablet TAKE ONE TABLET BY MOUTH TWICE A DAY AS NEEDED FOR ANXIETY 30 tablet 1  . metoprolol succinate (TOPROL-XL) 50 MG 24 hr tablet Take 1 tablet (50 mg total) by mouth 2 (two) times daily. Take with or immediately following a meal. 180 tablet 1  . Vitamin D, Ergocalciferol, (DRISDOL) 1.25 MG (50000 UNIT) CAPS capsule Take 50,000 Units by mouth once a week.    . APIXABAN (ELIQUIS) VTE STARTER PACK (10MG  AND 5MG ) Take as directed on package: start with two-5mg  tablets twice daily for 7 days. On day 8, switch to one-5mg  tablet twice daily. 1 each 0  . fluconazole (DIFLUCAN) 150 MG tablet Take 1 tablet (150 mg total) by mouth once a week. 2 tablet 0   No facility-administered medications prior to visit.    Allergies  Allergen Reactions  . Losartan Other (See Comments)    Diarrhea and abodminal cramping  . Oxycodone Itching    Review of Systems  Constitutional: Positive for malaise/fatigue. Negative for fever.  HENT: Negative for congestion.   Eyes: Negative for blurred vision.  Respiratory: Negative for shortness of breath.   Cardiovascular: Negative for chest pain, palpitations and leg swelling.  Gastrointestinal: Negative for abdominal pain, blood in stool and nausea.  Genitourinary: Negative for dysuria and frequency.  Musculoskeletal: Negative for falls.  Skin: Negative for rash.  Neurological: Negative for dizziness, loss of consciousness and headaches.  Endo/Heme/Allergies: Negative for environmental allergies.  Psychiatric/Behavioral: Positive for depression. The patient is nervous/anxious.        Objective:     Physical Exam Vitals and nursing note reviewed.  Constitutional:      General: She is not in acute distress.    Appearance: She is well-developed and well-nourished.  HENT:     Head: Normocephalic and atraumatic.     Nose: Nose normal.  Eyes:     General:        Right eye: No discharge.        Left eye: No discharge.  Cardiovascular:  Rate and Rhythm: Normal rate and regular rhythm.     Heart sounds: No murmur heard.   Pulmonary:     Effort: Pulmonary effort is normal.     Breath sounds: Normal breath sounds.  Abdominal:     General: Bowel sounds are normal.     Palpations: Abdomen is soft.     Tenderness: There is no abdominal tenderness.  Musculoskeletal:        General: No edema.     Cervical back: Normal range of motion and neck supple.  Skin:    General: Skin is warm and dry.  Neurological:     Mental Status: She is alert and oriented to person, place, and time.  Psychiatric:        Mood and Affect: Mood and affect normal.     BP (!) 141/71 (BP Location: Left Arm, Cuff Size: Large)   Pulse 71   Temp 98.1 F (36.7 C) (Oral)   Resp 12   Ht 5\' 3"  (1.6 m)   Wt 243 lb (110.2 kg)   SpO2 100%   BMI 43.05 kg/m  Wt Readings from Last 3 Encounters:  06/21/20 243 lb (110.2 kg)  05/05/20 241 lb 12.8 oz (109.7 kg)  03/22/20 240 lb (108.9 kg)    Diabetic Foot Exam - Simple   No data filed    Lab Results  Component Value Date   WBC 6.2 06/03/2020   HGB 11.5 (L) 06/03/2020   HCT 33.6 (L) 06/03/2020   PLT 264 06/03/2020   GLUCOSE 123 (H) 06/03/2020   CHOL 143 06/03/2020   TRIG 131 06/03/2020   HDL 66 06/03/2020   LDLCALC 56 06/03/2020   ALT 29 06/03/2020   AST 17 06/03/2020   NA 141 06/03/2020   K 4.2 06/03/2020   CL 104 06/03/2020   CREATININE 0.63 06/03/2020   BUN 10 06/03/2020   CO2 27 06/03/2020   TSH 2.85 06/03/2020   HGBA1C 6.5 (H) 06/03/2020    Lab Results  Component Value Date   TSH 2.85 06/03/2020   Lab Results  Component Value  Date   WBC 6.2 06/03/2020   HGB 11.5 (L) 06/03/2020   HCT 33.6 (L) 06/03/2020   MCV 81.8 06/03/2020   PLT 264 06/03/2020   Lab Results  Component Value Date   NA 141 06/03/2020   K 4.2 06/03/2020   CO2 27 06/03/2020   GLUCOSE 123 (H) 06/03/2020   BUN 10 06/03/2020   CREATININE 0.63 06/03/2020   BILITOT 0.4 06/03/2020   ALKPHOS 54 03/22/2020   AST 17 06/03/2020   ALT 29 06/03/2020   PROT 6.7 06/03/2020   ALBUMIN 4.2 03/22/2020   CALCIUM 9.4 06/03/2020   ANIONGAP 5 03/22/2020   GFR 102.63 07/09/2019   Lab Results  Component Value Date   CHOL 143 06/03/2020   Lab Results  Component Value Date   HDL 66 06/03/2020   Lab Results  Component Value Date   LDLCALC 56 06/03/2020   Lab Results  Component Value Date   TRIG 131 06/03/2020   Lab Results  Component Value Date   CHOLHDL 2.2 06/03/2020   Lab Results  Component Value Date   HGBA1C 6.5 (H) 06/03/2020       Assessment & Plan:   Problem List Items Addressed This Visit    Anxiety and depression    She is struggling with anhedonia she feels largely because of her weight. No change in treatment plan today  Hyperlipidemia    Encouraged heart healthy diet, increase exercise, avoid trans fats, consider a krill oil cap daily. Tolerating Atorvastatin      Relevant Medications   ELIQUIS 5 MG TABS tablet   Other Relevant Orders   Lipid panel   Hypertension    Well controlled, no changes to meds. Encouraged heart healthy diet such as the DASH diet and exercise as tolerated.       Relevant Medications   ELIQUIS 5 MG TABS tablet   Obesity    Encouraged DASH or MIND diet, decrease po intake and increase exercise as tolerated. Needs 7-8 hours of sleep nightly. Avoid trans fats, eat small, frequent meals every 4-5 hours with lean proteins, complex carbs and healthy fats. Minimize simple carbs, GMO foods.      Vitamin D deficiency    Supplement and monitor      Relevant Orders   VITAMIN D 25 Hydroxy  (Vit-D Deficiency, Fractures)   Vitamin B12 deficiency    Supplement and monitor      Relevant Orders   CBC   Vitamin B12   Hyperglycemia    hgba1c acceptable, minimize simple carbs. Increase exercise as tolerated.       Relevant Orders   Hemoglobin A1c   Comprehensive metabolic panel   IDA (iron deficiency anemia)    Increase leafy greens, consider increased lean red meat and using cast iron cookware. Continue to monitor, report any concerns      Relevant Orders   CBC      I have discontinued Jeimy L. Blaustein's Apixaban Starter Pack (10mg  and 5mg ) and fluconazole. I am also having her maintain her acetaminophen, cholecalciferol, atorvastatin, Vitamin D (Ergocalciferol), DULoxetine, metoprolol succinate, Enalapril-hydroCHLOROthiazide, LORazepam, Ferrous Fumarate-Folic Acid, and Eliquis.  No orders of the defined types were placed in this encounter.    Penni Homans, MD

## 2020-06-24 DIAGNOSIS — R3915 Urgency of urination: Secondary | ICD-10-CM | POA: Diagnosis not present

## 2020-06-24 DIAGNOSIS — N3941 Urge incontinence: Secondary | ICD-10-CM | POA: Diagnosis not present

## 2020-06-24 DIAGNOSIS — N393 Stress incontinence (female) (male): Secondary | ICD-10-CM | POA: Diagnosis not present

## 2020-06-27 ENCOUNTER — Encounter: Payer: Self-pay | Admitting: Obstetrics & Gynecology

## 2020-06-27 ENCOUNTER — Ambulatory Visit: Payer: Federal, State, Local not specified - PPO | Admitting: Obstetrics & Gynecology

## 2020-06-27 ENCOUNTER — Other Ambulatory Visit: Payer: Self-pay

## 2020-06-27 VITALS — BP 134/72 | HR 71 | Ht 63.0 in | Wt 240.0 lb

## 2020-06-27 DIAGNOSIS — N6320 Unspecified lump in the left breast, unspecified quadrant: Secondary | ICD-10-CM

## 2020-06-27 DIAGNOSIS — N644 Mastodynia: Secondary | ICD-10-CM | POA: Diagnosis not present

## 2020-06-27 NOTE — Progress Notes (Signed)
Patient noticed lump on the left breast about a week ago. Kathrene Alu RN

## 2020-06-27 NOTE — Progress Notes (Signed)
History:  50 y.o. G3P1001 here today for eval of palpable lump in left breast.  Pt reports pain and a lump over the past week. She does not recall an injury. She has been on Eloquis for a blood clot that developed after she was immobilized for 6 weeks following surgery for a detached retina.  Pt has no FH of breast CA. She breastfed for 2 weeks. She has a normal mammogram 5 months prev.   The following portions of the patient's history were reviewed and updated as appropriate: allergies, current medications, past family history, past medical history, past social history, past surgical history and problem list.  Review of Systems:  Pertinent items are noted in HPI.    Objective:  Physical Exam BP 134/72   Pulse 71   Ht 5\' 3"  (1.6 m)   Wt 240 lb (108.9 kg)   BMI 42.51 kg/m  CONSTITUTIONAL: Well-developed, well-nourished female in no acute distress.  HENT:  Normocephalic, atraumatic EYES: Conjunctivae and EOM are normal. No scleral icterus.  NECK: Normal range of motion SKIN: Skin is warm and dry. No rash noted. Not diaphoretic.No pallor. [place]: Alert and oriented to person, place, and time. Normal coordination.  Breast exam: bilaterally no skin changes. No nipple discharge. There is no palpable masses or tenderness on the right side. The left breast is mildly tender to palpation. There is a mildly indurated area at the 11:30 position 5 cm from the areola. This area is more tender than the rest of the tissue. There is no discreet mass.    Labs and Imaging 01/25/2020 CLINICAL DATA:  Screening.  EXAM: DIGITAL SCREENING BILATERAL MAMMOGRAM WITH TOMO AND CAD  COMPARISON:  Previous exam(s).  ACR Breast Density Category a: The breast tissue is almost entirely fatty.  FINDINGS: There are no findings suspicious for malignancy. Images were processed with CAD.  IMPRESSION: No mammographic evidence of malignancy. A result letter of this screening mammogram will be mailed directly  to the patient.  RECOMMENDATION: Screening mammogram in one year. (Code:SM-B-01Y)  BI-RADS CATEGORY  1: Negative  Assessment & Plan:  Mastalgia of left breast with some induration. I do not palpate a mass.   rec no self exam for the next 2 weeks  F/u in 2 weeks for repeat exam  NSAIDS prn  Haiven Nardone L. Harraway-Smith, M.D., Cherlynn June

## 2020-07-01 ENCOUNTER — Other Ambulatory Visit: Payer: Self-pay | Admitting: Family Medicine

## 2020-07-01 ENCOUNTER — Encounter: Payer: Self-pay | Admitting: Family Medicine

## 2020-07-01 MED ORDER — AMOXICILLIN 500 MG PO CAPS: 500.0000 mg | ORAL_CAPSULE | Freq: Three times a day (TID) | ORAL | 0 refills | Status: DC

## 2020-07-15 ENCOUNTER — Encounter: Payer: Self-pay | Admitting: Obstetrics & Gynecology

## 2020-07-15 ENCOUNTER — Other Ambulatory Visit: Payer: Self-pay

## 2020-07-15 ENCOUNTER — Ambulatory Visit: Payer: Federal, State, Local not specified - PPO | Admitting: Obstetrics & Gynecology

## 2020-07-15 VITALS — Ht 63.0 in | Wt 240.0 lb

## 2020-07-15 DIAGNOSIS — N644 Mastodynia: Secondary | ICD-10-CM | POA: Diagnosis not present

## 2020-07-15 NOTE — Patient Instructions (Signed)

## 2020-07-15 NOTE — Progress Notes (Signed)
History:  50 y.o. G3P1001 here today for reeval of breat tenderness and a possible mass. Pt reports that until this am she did not examine her breast. She 'thinks' she might still have some pain but, she doesn't notice this unless she palpates the area.   The following portions of the patient's history were reviewed and updated as appropriate: allergies, current medications, past family history, past medical history, past social history, past surgical history and problem list.  Review of Systems:  Pertinent items are noted in HPI.    Objective:  Physical Exam Height 5\' 3"  (1.6 m), weight 240 lb (108.9 kg).  CONSTITUTIONAL: Well-developed, well-nourished female in no acute distress.  HENT:  Normocephalic, atraumatic EYES: Conjunctivae and EOM are normal. No scleral icterus.  NECK: Normal range of motion SKIN: Skin is warm and dry. No rash noted. Not diaphoretic.No pallor. [place]: Alert and oriented to person, place, and time. Normal coordination.  Breast: no axillary LA bilaterally. No skin changes. No masses palpated on either breast. The area of induration is completely resolved and there is no tenderness.   Labs and Imaging 01/26/2020 CLINICAL DATA:  Screening.  EXAM: DIGITAL SCREENING BILATERAL MAMMOGRAM WITH TOMO AND CAD  COMPARISON:  Previous exam(s).  ACR Breast Density Category a: The breast tissue is almost entirely fatty.  FINDINGS: There are no findings suspicious for malignancy. Images were processed with CAD.  IMPRESSION: No mammographic evidence of malignancy. A result letter of this screening mammogram will be mailed directly to the patient.  RECOMMENDATION: Screening mammogram in one year. (Code:SM-B-01Y)  BI-RADS CATEGORY  1: Negative.  Assessment & Plan:  Breat tenderness and questionable mass.   Reviewed findings with pt. She concurs  F/u for annual or sooner prn  Total face-to-face time with patient was 20 min.  Greater than 50% was spent  in counseling and coordination of care with the patient.   Jarelle Ates L. Harraway-Smith, M.D., Cherlynn June

## 2020-07-15 NOTE — Progress Notes (Signed)
Pt states she still feels breast lump and it is sometimes painful Unable to get BP- Machine kept pumping up and pt states it was hurting

## 2020-07-22 ENCOUNTER — Ambulatory Visit: Payer: Federal, State, Local not specified - PPO | Admitting: Family

## 2020-07-22 ENCOUNTER — Other Ambulatory Visit: Payer: Federal, State, Local not specified - PPO

## 2020-07-22 DIAGNOSIS — H59811 Chorioretinal scars after surgery for detachment, right eye: Secondary | ICD-10-CM | POA: Diagnosis not present

## 2020-07-22 DIAGNOSIS — H43393 Other vitreous opacities, bilateral: Secondary | ICD-10-CM | POA: Diagnosis not present

## 2020-07-22 DIAGNOSIS — H43813 Vitreous degeneration, bilateral: Secondary | ICD-10-CM | POA: Diagnosis not present

## 2020-08-04 ENCOUNTER — Telehealth: Payer: Self-pay | Admitting: Family Medicine

## 2020-08-04 NOTE — Telephone Encounter (Signed)
Caller Jahira Swiss  Call Back @ [phone]   Patient is requesting a call back.

## 2020-08-04 NOTE — Telephone Encounter (Signed)
Patient stated that she was having UTI symptoms again, dysuria.  She has seen the urologist and has a follow up with them on 3/28.  She just wanted to know if she should see [place] or them.  Advised her that she should call them because the specialize in that area.  She has already put in a call and just waiting on a call back.

## 2020-08-08 DIAGNOSIS — R300 Dysuria: Secondary | ICD-10-CM | POA: Diagnosis not present

## 2020-08-08 DIAGNOSIS — B9620 Unspecified Escherichia coli [E. coli] as the cause of diseases classified elsewhere: Secondary | ICD-10-CM | POA: Diagnosis not present

## 2020-08-08 DIAGNOSIS — N3941 Urge incontinence: Secondary | ICD-10-CM | POA: Diagnosis not present

## 2020-08-08 DIAGNOSIS — N393 Stress incontinence (female) (male): Secondary | ICD-10-CM | POA: Diagnosis not present

## 2020-08-08 DIAGNOSIS — R3915 Urgency of urination: Secondary | ICD-10-CM | POA: Diagnosis not present

## 2020-08-08 DIAGNOSIS — N390 Urinary tract infection, site not specified: Secondary | ICD-10-CM | POA: Diagnosis not present

## 2020-08-18 ENCOUNTER — Other Ambulatory Visit: Payer: Federal, State, Local not specified - PPO

## 2020-08-18 ENCOUNTER — Ambulatory Visit: Payer: Federal, State, Local not specified - PPO | Admitting: Family

## 2020-08-20 ENCOUNTER — Other Ambulatory Visit: Payer: Self-pay | Admitting: Family Medicine

## 2020-08-22 ENCOUNTER — Telehealth: Payer: Self-pay | Admitting: *Deleted

## 2020-08-22 ENCOUNTER — Encounter: Payer: Self-pay | Admitting: Family

## 2020-08-22 ENCOUNTER — Other Ambulatory Visit: Payer: Self-pay

## 2020-08-22 ENCOUNTER — Inpatient Hospital Stay (HOSPITAL_BASED_OUTPATIENT_CLINIC_OR_DEPARTMENT_OTHER): Payer: Federal, State, Local not specified - PPO | Admitting: Family

## 2020-08-22 ENCOUNTER — Inpatient Hospital Stay: Payer: Federal, State, Local not specified - PPO | Attending: Family

## 2020-08-22 VITALS — BP 121/82 | HR 75 | Temp 98.3°F | Resp 18 | Ht 63.0 in | Wt 234.1 lb

## 2020-08-22 DIAGNOSIS — I82442 Acute embolism and thrombosis of left tibial vein: Secondary | ICD-10-CM | POA: Diagnosis not present

## 2020-08-22 DIAGNOSIS — E119 Type 2 diabetes mellitus without complications: Secondary | ICD-10-CM | POA: Insufficient documentation

## 2020-08-22 DIAGNOSIS — Z885 Allergy status to narcotic agent status: Secondary | ICD-10-CM | POA: Diagnosis not present

## 2020-08-22 DIAGNOSIS — R5383 Other fatigue: Secondary | ICD-10-CM | POA: Insufficient documentation

## 2020-08-22 DIAGNOSIS — I82402 Acute embolism and thrombosis of unspecified deep veins of left lower extremity: Secondary | ICD-10-CM | POA: Insufficient documentation

## 2020-08-22 DIAGNOSIS — D509 Iron deficiency anemia, unspecified: Secondary | ICD-10-CM | POA: Diagnosis not present

## 2020-08-22 DIAGNOSIS — Z7901 Long term (current) use of anticoagulants: Secondary | ICD-10-CM | POA: Insufficient documentation

## 2020-08-22 DIAGNOSIS — Z79899 Other long term (current) drug therapy: Secondary | ICD-10-CM | POA: Insufficient documentation

## 2020-08-22 DIAGNOSIS — D508 Other iron deficiency anemias: Secondary | ICD-10-CM

## 2020-08-22 LAB — CMP (CANCER CENTER ONLY)
ALT: 40 U/L (ref 0–44)
AST: 26 U/L (ref 15–41)
Albumin: 4.5 g/dL (ref 3.5–5.0)
Alkaline Phosphatase: 63 U/L (ref 38–126)
Anion gap: 9 (ref 5–15)
BUN: 22 mg/dL — ABNORMAL HIGH (ref 6–20)
CO2: 30 mmol/L (ref 22–32)
Calcium: 10.4 mg/dL — ABNORMAL HIGH (ref 8.9–10.3)
Chloride: 99 mmol/L (ref 98–111)
Creatinine: 0.72 mg/dL (ref 0.44–1.00)
GFR, Estimated: >60 mL/min (ref >60)
Glucose, Bld: 103 mg/dL — ABNORMAL HIGH (ref 70–99)
Potassium: 3.9 mmol/L (ref 3.5–5.1)
Sodium: 138 mmol/L (ref 135–145)
Total Bilirubin: 0.5 mg/dL (ref 0.3–1.2)
Total Protein: 7.8 g/dL (ref 6.5–8.1)

## 2020-08-22 LAB — CBC WITH DIFFERENTIAL (CANCER CENTER ONLY)
Abs Immature Granulocytes: 0.02 10*3/uL (ref 0.00–0.07)
Basophils Absolute: 0 10*3/uL (ref 0.0–0.1)
Basophils Relative: 1 %
Eosinophils Absolute: 0.1 10*3/uL (ref 0.0–0.5)
Eosinophils Relative: 2 %
HCT: 38.7 % (ref 36.0–46.0)
Hemoglobin: 12.9 g/dL (ref 12.0–15.0)
Immature Granulocytes: 0 %
Lymphocytes Relative: 33 %
Lymphs Abs: 2.5 10*3/uL (ref 0.7–4.0)
MCH: 27.2 pg (ref 26.0–34.0)
MCHC: 33.3 g/dL (ref 30.0–36.0)
MCV: 81.6 fL (ref 80.0–100.0)
Monocytes Absolute: 0.5 10*3/uL (ref 0.1–1.0)
Monocytes Relative: 7 %
Neutro Abs: 4.5 10*3/uL (ref 1.7–7.7)
Neutrophils Relative %: 57 %
Platelet Count: 318 10*3/uL (ref 150–400)
RBC: 4.74 MIL/uL (ref 3.87–5.11)
RDW: 13.9 % (ref 11.5–15.5)
WBC Count: 7.7 10*3/uL (ref 4.0–10.5)
nRBC: 0 % (ref 0.0–0.2)

## 2020-08-22 LAB — D-DIMER, QUANTITATIVE: D-Dimer, Quant: <0.27 ug/mL-FEU (ref 0.00–0.50)

## 2020-08-22 LAB — IRON AND TIBC
Iron: 63 ug/dL (ref 41–142)
Saturation Ratios: 15 % — ABNORMAL LOW (ref 21–57)
TIBC: 419 ug/dL (ref 236–444)
UIBC: 356 ug/dL (ref 120–384)

## 2020-08-22 LAB — FERRITIN: Ferritin: 126 ng/mL (ref 11–307)

## 2020-08-22 MED ORDER — ELIQUIS 5 MG PO TABS: 5.0000 mg | ORAL_TABLET | Freq: Two times a day (BID) | ORAL | 4 refills | Status: DC

## 2020-08-22 NOTE — Telephone Encounter (Signed)
Per los 08/22/20 -Gave patient upcoming appointment - [facility]

## 2020-08-22 NOTE — Progress Notes (Signed)
Hematology and Oncology Follow Up Visit  Julie Bishop [id number] 14-Mar-1971 50 y.o. 08/22/2020   Principle Diagnosis:  DVT of the left lower extremity  Current Therapy:        Eliquis 5 mg PO BID   Interim History:  Julie Bishop for follow-up. She is doing well but has some fatigue at times.  She is tolerating oral iron nicely. Hgb is now 12.9, MCV 81.  She continues to tolerate Eliquis nicely. No episodes of blood loss to report. No abnormal bruising, no petechiae.  No fever, chills, n/v, cough, rash, dizziness, SOB, chest pain, palpitations, abdominal pain or changes in bowel or bladder habits. No swelling, tenderness, numbness or tingling in her extremities.  No falls or syncope.  She has maintained a good appetite and is staying well hydrated. Her weight is stable.  She states that she was recently diagnosed with diabetes but is managing with diet and is starting to walk for exercise.   ECOG Performance Status: 1 - Symptomatic but completely ambulatory  Medications:  Allergies as of 08/22/2020      Reactions   Losartan Other (See Comments)   Diarrhea and abodminal cramping   Oxycodone Itching      Medication List       Accurate as of August 22, 2020  9:15 AM. If you have any questions, ask your nurse or doctor.        acetaminophen 500 MG tablet Commonly known as: TYLENOL Take 1,000 mg by mouth every 6 (six) hours as needed.   amoxicillin 500 MG capsule Commonly known as: AMOXIL Take 1 capsule (500 mg total) by mouth 3 (three) times daily.   atorvastatin 10 MG tablet Commonly known as: LIPITOR Take 1 tablet (10 mg total) by mouth daily.   cholecalciferol 25 MCG (1000 UNIT) tablet Commonly known as: VITAMIN D3 Take 2,000 Units by mouth daily.   DULoxetine 30 MG capsule Commonly known as: CYMBALTA Take 1 capsule (30 mg total) by mouth 3 (three) times daily.   Eliquis 5 MG Tabs tablet Generic drug: apixaban Take 5 mg by mouth 2 (two)  times daily.   Enalapril-hydroCHLOROthiazide 5-12.5 MG tablet TAKE ONE TABLET BY MOUTH DAILY   Ferrous Fumarate-Folic Acid 712-4 MG Tabs Take 1 tablet by mouth daily.   LORazepam 0.5 MG tablet Commonly known as: ATIVAN TAKE ONE TABLET BY MOUTH TWICE A DAY AS NEEDED FOR ANXIETY   metoprolol succinate 50 MG 24 hr tablet Commonly known as: TOPROL-XL Take 1 tablet (50 mg total) by mouth 2 (two) times daily. Take with or immediately following a meal.   Vitamin D (Ergocalciferol) 1.25 MG (50000 UNIT) Caps capsule Commonly known as: DRISDOL Take 50,000 Units by mouth once a week.       Allergies:  Allergies  Allergen Reactions  . Losartan Other (See Comments)    Diarrhea and abodminal cramping  . Oxycodone Itching    Past Medical History, Surgical history, Social history, and Family History were reviewed and updated.  Review of Systems: All other 10 point review of systems is negative.   Physical Exam:  vitals were not taken for this visit.   Wt Readings from Last 3 Encounters:  07/15/20 240 lb (108.9 kg)  06/27/20 240 lb (108.9 kg)  06/21/20 243 lb (110.2 kg)    Ocular: Sclerae unicteric, pupils equal, round and reactive to light Ear-nose-throat: Oropharynx clear, dentition fair Lymphatic: No cervical or supraclavicular adenopathy Lungs no rales or rhonchi, good excursion bilaterally  Heart regular rate and rhythm, no murmur appreciated Abd soft, nontender, positive bowel sounds MSK no focal spinal tenderness, no joint edema Neuro: non-focal, well-oriented, appropriate affect Breasts: Deferred   Lab Results  Component Value Date   WBC 7.7 08/22/2020   HGB 12.9 08/22/2020   HCT 38.7 08/22/2020   MCV 81.6 08/22/2020   PLT 318 08/22/2020   Lab Results  Component Value Date   FERRITIN 57 03/22/2020   IRON 43 03/22/2020   TIBC 363 03/22/2020   UIBC 319 03/22/2020   IRONPCTSAT 12 (L) 03/22/2020   Lab Results  Component Value Date   RBC 4.74 08/22/2020    No results found for: KPAFRELGTCHN, LAMBDASER, KAPLAMBRATIO No results found for: IGGSERUM, IGA, IGMSERUM No results found for: Odetta Pink, SPEI   Chemistry      Component Value Date/Time   NA 141 06/03/2020 0720   NA 138 10/22/2017 0935   K 4.2 06/03/2020 0720   CL 104 06/03/2020 0720   CO2 27 06/03/2020 0720   BUN 10 06/03/2020 0720   BUN 15 10/22/2017 0935   CREATININE 0.63 06/03/2020 0720      Component Value Date/Time   CALCIUM 9.4 06/03/2020 0720   ALKPHOS 54 03/22/2020 0916   AST 17 06/03/2020 0720   AST 21 03/22/2020 0916   ALT 29 06/03/2020 0720   ALT 28 03/22/2020 0916   BILITOT 0.4 06/03/2020 0720   BILITOT 0.3 03/22/2020 0916       Impression and Plan: Julie Bishop is a very pleasant 75 yocaucasian female with new diagnosis of DVT within the posterior tibial vein of the left calfpost eye surgery.Hyper coag work up was negative.  Iron studies are pending. She seems to be doing quite well on her daily iron supplement.  She will complete one year of full dose anticoagulation followed by one year of maintenance anticoagulation.  Follow-up in 5 months at that time we will have her reduce Eliquis to maintenance dose.   Prescription for Eliquis refilled.  She can contact our office with any questions or concerns.   Laverna Peace, NP 3/28/20229:15 AM

## 2020-08-23 ENCOUNTER — Encounter: Payer: Self-pay | Admitting: *Deleted

## 2020-08-29 DIAGNOSIS — N393 Stress incontinence (female) (male): Secondary | ICD-10-CM | POA: Diagnosis not present

## 2020-08-29 DIAGNOSIS — N3941 Urge incontinence: Secondary | ICD-10-CM | POA: Diagnosis not present

## 2020-09-12 ENCOUNTER — Encounter: Payer: Self-pay | Admitting: Family Medicine

## 2020-09-13 ENCOUNTER — Other Ambulatory Visit: Payer: Self-pay

## 2020-09-13 ENCOUNTER — Other Ambulatory Visit: Payer: Self-pay | Admitting: *Deleted

## 2020-09-13 ENCOUNTER — Other Ambulatory Visit (INDEPENDENT_AMBULATORY_CARE_PROVIDER_SITE_OTHER): Payer: Federal, State, Local not specified - PPO

## 2020-09-13 DIAGNOSIS — K921 Melena: Secondary | ICD-10-CM

## 2020-09-13 NOTE — Addendum Note (Signed)
Addended by: Kelle Darting A on: 09/13/2020 02:58 PM   Modules accepted: Orders

## 2020-09-14 LAB — CBC
HCT: 35.9 % — ABNORMAL LOW (ref 36.0–46.0)
Hemoglobin: 12.2 g/dL (ref 12.0–15.0)
MCHC: 33.9 g/dL (ref 30.0–36.0)
MCV: 82 fl (ref 78.0–100.0)
Platelets: 249 10*3/uL (ref 150.0–400.0)
RBC: 4.38 Mil/uL (ref 3.87–5.11)
RDW: 15.7 % — ABNORMAL HIGH (ref 11.5–15.5)
WBC: 7 10*3/uL (ref 4.0–10.5)

## 2020-09-16 ENCOUNTER — Encounter: Payer: Self-pay | Admitting: Adult Health

## 2020-09-16 ENCOUNTER — Telehealth (INDEPENDENT_AMBULATORY_CARE_PROVIDER_SITE_OTHER): Payer: Federal, State, Local not specified - PPO | Admitting: Adult Health

## 2020-09-16 DIAGNOSIS — R059 Cough, unspecified: Secondary | ICD-10-CM | POA: Diagnosis not present

## 2020-09-16 MED ORDER — BENZONATATE 200 MG PO CAPS: 200.0000 mg | ORAL_CAPSULE | Freq: Three times a day (TID) | ORAL | 1 refills | Status: DC | PRN

## 2020-09-16 MED ORDER — PREDNISONE 10 MG PO TABS: ORAL_TABLET | ORAL | 0 refills | Status: DC

## 2020-09-16 NOTE — Progress Notes (Signed)
Virtual Visit via Video Note  I connected with Julie Bishop  on 09/16/20 at  3:00 PM EDT by a video enabled telemedicine application and verified that I am speaking with the correct person using two identifiers.  Location patient: home Location provider:work or home office Persons participating in the virtual visit: patient, provider  I discussed the limitations of evaluation and management by telemedicine and the availability of in person appointments. The patient expressed understanding and agreed to proceed.   HPI: 50 year old female who  has a past medical history of Adjustment disorder (01/25/2017), Anemia, Anxiety and depression, Back pain, Cancer ([place]) (2008), Constipation (11/14/2014), Deep vein blood clot of left lower extremity ([place]) (12/2019), Depression, Diabetes mellitus without complication ([place]), Generalized anxiety disorder, GERD (gastroesophageal reflux disease), Headache (08/24/2015), History of cardiovascular stress test, History of echocardiogram, Hyperlipidemia, Hypertension, Insomnia (08/24/2015), Obesity (11/14/2014), OSA (obstructive sleep apnea) (09/12/2016), Pain in joint, shoulder region (11/27/2015), Palpitations, Post-menopause (08/23/2015), Raynauds syndrome, Sleep apnea, Vitamin B12 deficiency (08/23/2015), and Vitamin D deficiency (08/23/2015).  Is being evaluated today for an acute issue.  Her symptoms started 3 days ago with a sore throat and has progressed to a nonproductive constant cough with chest congestion.  Associated symptoms include questionable wheezing and feeling mildly short of breath when wearing her facemask.  She has had this issue in the past and was told that she has bronchitis.  She denies fevers or chills.   ROS: See pertinent positives and negatives per HPI.  Past Medical History:  Diagnosis Date  . Adjustment disorder 01/25/2017  . Anemia   . Anxiety and depression   . Back pain   . Cancer ([place]) 2008   skin cancer, BCC  . Constipation  11/14/2014  . Deep vein blood clot of left lower extremity ([place]) 12/2019  . Depression   . Diabetes mellitus without complication ([place])   . Generalized anxiety disorder   . GERD (gastroesophageal reflux disease)   . Headache 08/24/2015  . History of cardiovascular stress test    GXT 3/19: No ischemic ECG changes  . History of echocardiogram    Echo 2/18:  EF 50-55, GLS -20% (normal), normal wall motion, grade 1 diastolic dysfunction, trivial MR, mild RVE, normal RV SF, trivial TR, PASP 23  . Hyperlipidemia    Elevated, but never taken medication  . Hypertension   . Insomnia 08/24/2015  . Obesity 11/14/2014  . OSA (obstructive sleep apnea) 09/12/2016  . Pain in joint, shoulder region 11/27/2015  . Palpitations   . Post-menopause 08/23/2015  . Raynauds syndrome   . Sleep apnea    appt with pulmonary Dr in march 2018  . Vitamin B12 deficiency 08/23/2015  . Vitamin D deficiency 08/23/2015    Past Surgical History:  Procedure Laterality Date  . ABDOMINAL HYSTERECTOMY  2010  . BREAST BIOPSY     right, 1996  . ECTOPIC PREGNANCY SURGERY    . RETINAL DETACHMENT SURGERY Right   . SHOULDER OPEN ROTATOR CUFF REPAIR  2017   right  . TUBAL LIGATION     on right  . TYMPANOSTOMY TUBE PLACEMENT      Family History  Problem Relation Age of Onset  . Diabetes Mother   . Depression Mother   . Hypertension Mother   . Hyperlipidemia Mother   . Liver disease Mother   . Sleep apnea Mother   . Obesity Mother   . Heart attack Mother 33       CAD w/MI in mid 48s; s/p  PCI  . Diabetes Father   . Hypertension Father   . Cancer Maternal Grandmother        throat  . Heart attack Maternal Grandmother   . Cancer Paternal Grandmother        lung  . Heart disease Paternal Grandmother   . Hypertension Sister        as a teenager       Current Outpatient Medications:  .  acetaminophen (TYLENOL) 500 MG tablet, Take 1,000 mg by mouth every 6 (six) hours as needed. , Disp: , Rfl:  .  atorvastatin  (LIPITOR) 10 MG tablet, Take 1 tablet (10 mg total) by mouth daily. Please make overdue appt with Dr. Curt Bears before anymore refills. Thank you 1st attempt, Disp: 30 tablet, Rfl: 0 .  cholecalciferol (VITAMIN D3) 25 MCG (1000 UNIT) tablet, Take 2,000 Units by mouth daily., Disp: , Rfl:  .  DULoxetine (CYMBALTA) 30 MG capsule, Take 1 capsule (30 mg total) by mouth 3 (three) times daily., Disp: 270 capsule, Rfl: 1 .  ELIQUIS 5 MG TABS tablet, Take 1 tablet (5 mg total) by mouth 2 (two) times daily., Disp: 60 tablet, Rfl: 4 .  Enalapril-hydroCHLOROthiazide 5-12.5 MG tablet, TAKE ONE TABLET BY MOUTH DAILY, Disp: 90 tablet, Rfl: 1 .  Ferrous Fumarate-Folic Acid 638-7 MG TABS, Take 1 tablet by mouth daily., Disp: 30 tablet, Rfl: 2 .  LORazepam (ATIVAN) 0.5 MG tablet, TAKE ONE TABLET BY MOUTH TWICE A DAY AS NEEDED FOR ANXIETY, Disp: 30 tablet, Rfl: 1 .  metoprolol succinate (TOPROL-XL) 50 MG 24 hr tablet, Take 1 tablet (50 mg total) by mouth 2 (two) times daily. Take with or immediately following a meal., Disp: 180 tablet, Rfl: 1 .  mirabegron ER (MYRBETRIQ) 25 MG TB24 tablet, Take 25 mg by mouth daily., Disp: , Rfl:  .  Vitamin D, Ergocalciferol, (DRISDOL) 1.25 MG (50000 UNIT) CAPS capsule, Take 50,000 Units by mouth once a week., Disp: , Rfl:   EXAM:  VITALS per patient if applicable:  GENERAL: alert, oriented, appears well and in no acute distress  HEENT: atraumatic, conjunttiva clear, no obvious abnormalities on inspection of external nose and ears  NECK: normal movements of the head and neck  LUNGS: on inspection no signs of respiratory distress, breathing rate appears normal, no obvious gross SOB, gasping or wheezing. Dry hacking cough present  CV: no obvious cyanosis  MS: moves all visible extremities without noticeable abnormality  PSYCH/NEURO: pleasant and cooperative, no obvious depression or anxiety, speech and thought processing grossly intact  ASSESSMENT AND PLAN:  Discussed the  following assessment and plan:  1. Cough - possibly viral bronchitis. No concern for bacterial infection at this time.  - predniSONE (DELTASONE) 10 MG tablet; 40 mg x 3 days, 20 mg x 3 days, 10 mg x 3 days  Dispense: 21 tablet; Refill: 0 - benzonatate (TESSALON) 200 MG capsule; Take 1 capsule (200 mg total) by mouth 3 (three) times daily as needed for cough.  Dispense: 30 capsule; Refill: 1 - Follow up with PCP if not improving in the next 2-3 days  - Can also use OTC Mucinex and Delsym     I discussed the assessment and treatment plan with the patient. The patient was provided an opportunity to ask questions and all were answered. The patient agreed with the plan and demonstrated an understanding of the instructions.   The patient was advised to call back or seek an in-person evaluation if the symptoms worsen or  if the condition fails to improve as anticipated.   Dorothyann Peng, NP

## 2020-09-19 ENCOUNTER — Other Ambulatory Visit: Payer: Self-pay

## 2020-09-19 ENCOUNTER — Other Ambulatory Visit (INDEPENDENT_AMBULATORY_CARE_PROVIDER_SITE_OTHER): Payer: Federal, State, Local not specified - PPO

## 2020-09-19 DIAGNOSIS — D509 Iron deficiency anemia, unspecified: Secondary | ICD-10-CM

## 2020-09-19 DIAGNOSIS — E538 Deficiency of other specified B group vitamins: Secondary | ICD-10-CM

## 2020-09-19 DIAGNOSIS — E559 Vitamin D deficiency, unspecified: Secondary | ICD-10-CM

## 2020-09-19 DIAGNOSIS — R739 Hyperglycemia, unspecified: Secondary | ICD-10-CM | POA: Diagnosis not present

## 2020-09-19 DIAGNOSIS — E782 Mixed hyperlipidemia: Secondary | ICD-10-CM | POA: Diagnosis not present

## 2020-09-19 LAB — COMPREHENSIVE METABOLIC PANEL
ALT: 27 U/L (ref 0–35)
AST: 18 U/L (ref 0–37)
Albumin: 4 g/dL (ref 3.5–5.2)
Alkaline Phosphatase: 69 U/L (ref 39–117)
BUN: 16 mg/dL (ref 6–23)
CO2: 26 mEq/L (ref 19–32)
Calcium: 9.6 mg/dL (ref 8.4–10.5)
Chloride: 105 mEq/L (ref 96–112)
Creatinine, Ser: 0.67 mg/dL (ref 0.40–1.20)
GFR: 102.61 mL/min (ref >60.00)
Glucose, Bld: 109 mg/dL — ABNORMAL HIGH (ref 70–99)
Potassium: 4.3 mEq/L (ref 3.5–5.1)
Sodium: 140 mEq/L (ref 135–145)
Total Bilirubin: 0.4 mg/dL (ref 0.2–1.2)
Total Protein: 7 g/dL (ref 6.0–8.3)

## 2020-09-19 LAB — CBC
HCT: 36 % (ref 36.0–46.0)
Hemoglobin: 12.4 g/dL (ref 12.0–15.0)
MCHC: 34.5 g/dL (ref 30.0–36.0)
MCV: 82 fl (ref 78.0–100.0)
Platelets: 277 10*3/uL (ref 150.0–400.0)
RBC: 4.39 Mil/uL (ref 3.87–5.11)
RDW: 16.3 % — ABNORMAL HIGH (ref 11.5–15.5)
WBC: 6.4 10*3/uL (ref 4.0–10.5)

## 2020-09-19 LAB — VITAMIN B12: Vitamin B-12: 420 pg/mL (ref 211–911)

## 2020-09-19 LAB — LIPID PANEL
Cholesterol: 134 mg/dL (ref 0–200)
HDL: 56.3 mg/dL (ref >39.00)
LDL Cholesterol: 63 mg/dL (ref 0–99)
NonHDL: 77.22
Total CHOL/HDL Ratio: 2
Triglycerides: 72 mg/dL (ref 0.0–149.0)
VLDL: 14.4 mg/dL (ref 0.0–40.0)

## 2020-09-19 LAB — HEMOGLOBIN A1C: Hgb A1c MFr Bld: 5.9 % (ref 4.6–6.5)

## 2020-09-19 LAB — VITAMIN D 25 HYDROXY (VIT D DEFICIENCY, FRACTURES): VITD: 50.98 ng/mL (ref 30.00–100.00)

## 2020-09-20 ENCOUNTER — Other Ambulatory Visit: Payer: Self-pay | Admitting: Family Medicine

## 2020-09-21 ENCOUNTER — Other Ambulatory Visit: Payer: Self-pay | Admitting: *Deleted

## 2020-09-21 MED ORDER — ATORVASTATIN CALCIUM 10 MG PO TABS: 10.0000 mg | ORAL_TABLET | Freq: Every day | ORAL | 0 refills | Status: DC

## 2020-09-21 NOTE — Telephone Encounter (Signed)
Pt aware of instructions

## 2020-09-26 ENCOUNTER — Other Ambulatory Visit: Payer: Federal, State, Local not specified - PPO

## 2020-09-26 ENCOUNTER — Other Ambulatory Visit: Payer: Self-pay

## 2020-09-27 ENCOUNTER — Telehealth: Payer: Federal, State, Local not specified - PPO | Admitting: Family Medicine

## 2020-09-27 ENCOUNTER — Other Ambulatory Visit (INDEPENDENT_AMBULATORY_CARE_PROVIDER_SITE_OTHER): Payer: Federal, State, Local not specified - PPO

## 2020-09-27 DIAGNOSIS — K921 Melena: Secondary | ICD-10-CM

## 2020-09-27 LAB — FECAL OCCULT BLOOD, IMMUNOCHEMICAL: Fecal Occult Bld: NEGATIVE

## 2020-10-11 ENCOUNTER — Telehealth: Payer: Self-pay | Admitting: Physician Assistant

## 2020-10-11 ENCOUNTER — Telehealth: Payer: Self-pay | Admitting: Family Medicine

## 2020-10-11 ENCOUNTER — Other Ambulatory Visit: Payer: Self-pay

## 2020-10-11 DIAGNOSIS — I10 Essential (primary) hypertension: Secondary | ICD-10-CM

## 2020-10-11 MED ORDER — DULOXETINE HCL 30 MG PO CPEP: 30.0000 mg | ORAL_CAPSULE | Freq: Three times a day (TID) | ORAL | 1 refills | Status: DC

## 2020-10-11 MED ORDER — ATORVASTATIN CALCIUM 10 MG PO TABS: 10.0000 mg | ORAL_TABLET | Freq: Every day | ORAL | 0 refills | Status: DC

## 2020-10-11 MED ORDER — METOPROLOL SUCCINATE ER 50 MG PO TB24: 50.0000 mg | ORAL_TABLET | Freq: Two times a day (BID) | ORAL | 1 refills | Status: DC

## 2020-10-11 NOTE — Telephone Encounter (Signed)
Pt's medication was sent to pt's pharmacy as requested. Confirmation received.  °

## 2020-10-11 NOTE — Telephone Encounter (Signed)
*  STAT* If patient is at the pharmacy, call can be transferred to refill team.   1. Which medications need to be refilled? (please list name of each medication and dose if known)   atorvastatin (LIPITOR) 10 MG tablet    2. Which pharmacy/location (including street and city if local pharmacy) is medication to be sent to? [place]  3. Do they need a 30 day or 90 day supply?  90 day supply    PT is all out of this medication.She was told she needs a appt to get refills, her appt is on 12/28/2020 at 3:45

## 2020-10-11 NOTE — Telephone Encounter (Signed)
Medication:DULoxetine (CYMBALTA) 30 MG capsule [[id number]]   metoprolol succinate (TOPROL-XL) 50 MG 24 hr tablet [[id number]]       Has the patient contacted their pharmacy? no (If no, request that the patient contact the pharmacy for the refill.) (If yes, when and what did the pharmacy advise?)    Preferred Pharmacy (with phone number or street name): [facility], [place] [facility]  [address], [place] [address]  Phone:  [phone] Fax:  [phone]    Agent: Please be advised that RX refills may take up to 3 business days. We ask that you follow-up with your pharmacy.

## 2020-10-11 NOTE — Telephone Encounter (Signed)
Medication sent.

## 2020-10-27 ENCOUNTER — Encounter: Payer: Self-pay | Admitting: Family Medicine

## 2020-10-27 ENCOUNTER — Other Ambulatory Visit: Payer: Self-pay

## 2020-10-27 ENCOUNTER — Telehealth: Payer: Self-pay

## 2020-10-27 DIAGNOSIS — I10 Essential (primary) hypertension: Secondary | ICD-10-CM

## 2020-10-27 MED ORDER — ENALAPRIL-HYDROCHLOROTHIAZIDE 5-12.5 MG PO TABS: ORAL_TABLET | ORAL | 1 refills | Status: DC

## 2020-10-27 NOTE — Telephone Encounter (Signed)
Pt called stating she reached out to her pharmacy for refill on hydrochlorothiazide, but they had no refills on file for her.  Please send this refill to Julie Bishop at [facility].  Pt stated she is wanting this and all future refills/medications to be sent to this Julie Bishop from here on out.

## 2020-10-27 NOTE — Telephone Encounter (Signed)
Medication sent.

## 2020-10-28 DIAGNOSIS — Z20822 Contact with and (suspected) exposure to covid-19: Secondary | ICD-10-CM | POA: Diagnosis not present

## 2020-10-28 NOTE — Telephone Encounter (Signed)
We had no opening for today

## 2020-10-29 ENCOUNTER — Emergency Department (HOSPITAL_BASED_OUTPATIENT_CLINIC_OR_DEPARTMENT_OTHER)
Admission: EM | Admit: 2020-10-29 | Discharge: 2020-10-29 | Disposition: A | Discharge status: Home / Self Care | Payer: Federal, State, Local not specified - PPO | Attending: Emergency Medicine | Admitting: Emergency Medicine

## 2020-10-29 ENCOUNTER — Encounter (HOSPITAL_BASED_OUTPATIENT_CLINIC_OR_DEPARTMENT_OTHER): Payer: Self-pay

## 2020-10-29 ENCOUNTER — Other Ambulatory Visit: Payer: Self-pay

## 2020-10-29 DIAGNOSIS — E1169 Type 2 diabetes mellitus with other specified complication: Secondary | ICD-10-CM | POA: Diagnosis not present

## 2020-10-29 DIAGNOSIS — E785 Hyperlipidemia, unspecified: Secondary | ICD-10-CM | POA: Diagnosis not present

## 2020-10-29 DIAGNOSIS — I10 Essential (primary) hypertension: Secondary | ICD-10-CM | POA: Insufficient documentation

## 2020-10-29 DIAGNOSIS — M5442 Lumbago with sciatica, left side: Secondary | ICD-10-CM | POA: Insufficient documentation

## 2020-10-29 DIAGNOSIS — M5440 Lumbago with sciatica, unspecified side: Secondary | ICD-10-CM

## 2020-10-29 DIAGNOSIS — Z85828 Personal history of other malignant neoplasm of skin: Secondary | ICD-10-CM | POA: Insufficient documentation

## 2020-10-29 DIAGNOSIS — R059 Cough, unspecified: Secondary | ICD-10-CM | POA: Diagnosis not present

## 2020-10-29 DIAGNOSIS — J029 Acute pharyngitis, unspecified: Secondary | ICD-10-CM | POA: Diagnosis not present

## 2020-10-29 DIAGNOSIS — Z79899 Other long term (current) drug therapy: Secondary | ICD-10-CM | POA: Diagnosis not present

## 2020-10-29 DIAGNOSIS — Z7901 Long term (current) use of anticoagulants: Secondary | ICD-10-CM | POA: Insufficient documentation

## 2020-10-29 LAB — GROUP A STREP BY PCR: Group A Strep by PCR: NOT DETECTED

## 2020-10-29 NOTE — ED Triage Notes (Signed)
Pt here with c/o ST/productive cough x 3 days with painful swallowing.  Pt also endorses lower back pain that radiates to L leg/groin (hx of DVT 2021 unsure if sxs are related).  2 negative home covid tests.

## 2020-10-29 NOTE — ED Provider Notes (Signed)
[facility] EMERGENCY DEPARTMENT Provider Note   CSN: [id number] Arrival date & time: 10/29/20  0715     History Chief Complaint  Patient presents with  . Sore Throat    Julie Bishop is a 50 y.o. female.  Patient presents chief complaint of cough sore throat and lower back pain.  She states that symptoms all began about 3 days ago.  Initially started with a sore throat, taking Tylenol at home with some improvement.  She developed a cough throughout the day described as productive.  Then she developed lower back pain rating down her left leg.  She denies fevers, no vomiting or diarrhea.  Denies chest pain or difficulty breathing.  She had home COVID test done that were negative and took a [facility] test at [facility] that is pending final result.  She was otherwise prior vaccinated for COVID.  She states she has a history of DVT in the left lower extremity and has been taking regular anticoagulation medication.        Past Medical History:  Diagnosis Date  . Adjustment disorder 01/25/2017  . Anemia   . Anxiety and depression   . Back pain   . Cancer ([place]) 2008   skin cancer, BCC  . Constipation 11/14/2014  . Deep vein blood clot of left lower extremity ([place]) 12/2019  . Depression   . Diabetes mellitus without complication ([place])   . Generalized anxiety disorder   . GERD (gastroesophageal reflux disease)   . Headache 08/24/2015  . History of cardiovascular stress test    GXT 3/19: No ischemic ECG changes  . History of echocardiogram    Echo 2/18:  EF 50-55, GLS -20% (normal), normal wall motion, grade 1 diastolic dysfunction, trivial MR, mild RVE, normal RV SF, trivial TR, PASP 23  . Hyperlipidemia    Elevated, but never taken medication  . Hypertension   . Insomnia 08/24/2015  . Obesity 11/14/2014  . OSA (obstructive sleep apnea) 09/12/2016  . Pain in joint, shoulder region 11/27/2015  . Palpitations   . Post-menopause 08/23/2015  . Raynauds syndrome   . Sleep apnea     appt with pulmonary Dr in march 2018  . Vitamin B12 deficiency 08/23/2015  . Vitamin D deficiency 08/23/2015    Patient Active Problem List   Diagnosis Date Noted  . IDA (iron deficiency anemia) 03/23/2020  . Urinary incontinence 07/09/2019  . Palpitations 07/09/2019  . Hip pain, left 07/15/2018  . Otitis media of left ear 01/29/2018  . Hyperglycemia 05/02/2017  . Adjustment disorder 01/25/2017  . OSA (obstructive sleep apnea) 09/12/2016  . SOB (shortness of breath) 07/01/2016  . Chest pain 07/01/2016  . Headache disorder 04/01/2016  . Snoring 04/01/2016  . Pain in joint, shoulder region 11/27/2015  . Insomnia 08/24/2015  . Vitamin D deficiency 08/23/2015  . Vitamin B12 deficiency 08/23/2015  . Preventative health care 08/23/2015  . Post-menopause 08/23/2015  . Polydipsia 12/08/2014  . Constipation 11/14/2014  . Obesity 11/14/2014  . Anxiety and depression   . Cancer ([place])   . GERD (gastroesophageal reflux disease)   . Hyperlipidemia   . Hypertension     Past Surgical History:  Procedure Laterality Date  . ABDOMINAL HYSTERECTOMY  2010  . BREAST BIOPSY     right, 1996  . ECTOPIC PREGNANCY SURGERY    . RETINAL DETACHMENT SURGERY Right   . SHOULDER OPEN ROTATOR CUFF REPAIR  2017   right  . TUBAL LIGATION     on  right  . TYMPANOSTOMY TUBE PLACEMENT       OB History    Gravida  3   Para  1   Term  1   Preterm      AB      Living  1     SAB      IAB      Ectopic      Multiple      Live Births  1           Family History  Problem Relation Age of Onset  . Diabetes Mother   . Depression Mother   . Hypertension Mother   . Hyperlipidemia Mother   . Liver disease Mother   . Sleep apnea Mother   . Obesity Mother   . Heart attack Mother 33       CAD w/MI in mid 89s; s/p PCI  . Diabetes Father   . Hypertension Father   . Cancer Maternal Grandmother        throat  . Heart attack Maternal Grandmother   . Cancer Paternal Grandmother         lung  . Heart disease Paternal Grandmother   . Hypertension Sister        as a teenager    Social History   Tobacco Use  . Smoking status: Never Smoker  . Smokeless tobacco: Never Used  Vaping Use  . Vaping Use: Never used  Substance Use Topics  . Alcohol use: No    Alcohol/week: 0.0 standard drinks  . Drug use: No    Home Medications Prior to Admission medications   Medication Sig Start Date End Date Taking? Authorizing Provider  acetaminophen (TYLENOL) 500 MG tablet Take 1,000 mg by mouth every 6 (six) hours as needed.    Yes [provider]  atorvastatin (LIPITOR) 10 MG tablet Take 1 tablet (10 mg total) by mouth daily. Please keep upcoming appt in August 2022 before anymore refills. Thank you Final Attempt 10/11/20  Yes Camnitz, Ocie Doyne, MD  cholecalciferol (VITAMIN D3) 25 MCG (1000 UNIT) tablet Take 2,000 Units by mouth daily.   Yes [provider]  DULoxetine (CYMBALTA) 30 MG capsule Take 1 capsule (30 mg total) by mouth 3 (three) times daily. 10/11/20  Yes [name], MD  ELIQUIS 5 MG TABS tablet Take 1 tablet (5 mg total) by mouth 2 (two) times daily. 08/22/20  Yes [place], Holli Humbles, NP  Enalapril-hydroCHLOROthiazide 5-12.5 MG tablet TAKE ONE TABLET BY MOUTH DAILY 10/27/20  Yes [name], MD  Ferrous Fumarate-Folic Acid 462-7 MG TABS Take 1 tablet by mouth daily. 05/06/20  Yes [name], MD  metoprolol succinate (TOPROL-XL) 50 MG 24 hr tablet Take 1 tablet (50 mg total) by mouth 2 (two) times daily. Take with or immediately following a meal. 10/11/20  Yes [name], MD  mirabegron ER (MYRBETRIQ) 25 MG TB24 tablet Take 25 mg by mouth daily.   Yes [provider]  benzonatate (TESSALON) 200 MG capsule Take 1 capsule (200 mg total) by mouth 3 (three) times daily as needed for cough. 09/16/20   Nafziger, Tommi Rumps, NP  LORazepam (ATIVAN) 0.5 MG tablet TAKE ONE TABLET BY MOUTH TWICE A DAY AS NEEDED FOR ANXIETY 03/16/20   [name],  MD  predniSONE (DELTASONE) 10 MG tablet 40 mg x 3 days, 20 mg x 3 days, 10 mg x 3 days 09/16/20   Dorothyann Peng, NP  Vitamin D, Ergocalciferol, (DRISDOL) 1.25  MG (50000 UNIT) CAPS capsule Take 50,000 Units by mouth once a week. 10/16/19   [provider]    Allergies    Losartan and Oxycodone  Review of Systems   Review of Systems  Constitutional: Negative for fever.  HENT: Negative for ear pain.   Eyes: Negative for pain.  Respiratory: Positive for cough.   Cardiovascular: Negative for chest pain.  Gastrointestinal: Negative for abdominal pain.  Genitourinary: Negative for flank pain.  Musculoskeletal: Positive for back pain.  Skin: Negative for rash.  Neurological: Negative for headaches.    Physical Exam Updated Vital Signs BP 140/83 (BP Location: Right Arm)   Pulse 87   Temp 98.7 F (37.1 C) (Oral)   Resp 18   Ht 5\' 3"  (1.6 m)   Wt 93 kg   SpO2 95%   BMI 36.31 kg/m   Physical Exam Constitutional:      General: She is not in acute distress.    Appearance: Normal appearance.  HENT:     Head: Normocephalic.     Nose: Nose normal.     Mouth/Throat:     Pharynx: Oropharyngeal exudate present. No posterior oropharyngeal erythema.  Eyes:     Extraocular Movements: Extraocular movements intact.  Neck:     Comments: Mild cervical of adenopathy is present bilaterally. Cardiovascular:     Rate and Rhythm: Normal rate.  Pulmonary:     Effort: Pulmonary effort is normal.  Abdominal:     Tenderness: There is no right CVA tenderness or left CVA tenderness.  Musculoskeletal:        General: Normal range of motion.     Cervical back: Normal range of motion.     Comments: No C or T or L-spine midline step-offs or tenderness noted.  Equivocal left leg straight leg test.  Bilateral lower extremities showed no evidence of abnormal swelling or cellulitis or abnormal warmth or tenderness.  Pulses are intact bilateral lower extremities 2+ femoral popliteal and  dorsalis pedis.  Neurological:     General: No focal deficit present.     Mental Status: She is alert. Mental status is at baseline.     ED Results / Procedures / Treatments   Labs (all labs ordered are listed, but only abnormal results are displayed) Labs Reviewed  GROUP A STREP BY PCR    EKG None  Radiology No results found.  Procedures Procedures   Medications Ordered in ED Medications - No data to display  ED Course  I have reviewed the triage vital signs and the nursing notes.  Pertinent labs & imaging results that were available during my care of the patient were reviewed by me and considered in my medical decision making (see chart for details).    MDM Rules/Calculators/A&P                          Clinically have very low suspicion of worsening DVT of left lower extremity.  Patient denies any new numbness or weakness or severe back pain at this time.  She states is worse when she makes certain movements.  Rapid strep test was sent pending result.  Symptoms appear upper respiratory with lower back pain secondary to cough.  Will recommend outpatient follow-up with her doctors in 3 to 4 days, recommending immediate return for difficulty breathing worsening symptoms new numbness weakness or any additional concerns.  Final Clinical Impression(s) / ED Diagnoses Final diagnoses:  Pharyngitis, unspecified etiology  Acute midline  low back pain with sciatica, sciatica laterality unspecified    Rx / DC Orders ED Discharge Orders    None       Luna Fuse, MD 10/29/20 [phone]

## 2020-10-29 NOTE — Discharge Instructions (Addendum)
Call your primary care doctor or specialist as discussed in the next 2-3 days.   Return immediately back to the ER if:  Your symptoms worsen within the next 12-24 hours. You develop new symptoms such as new fevers, persistent vomiting, new pain, shortness of breath, or new weakness or numbness, or if you have any other concerns.  

## 2020-10-31 DIAGNOSIS — R070 Pain in throat: Secondary | ICD-10-CM | POA: Diagnosis not present

## 2020-10-31 DIAGNOSIS — Z20822 Contact with and (suspected) exposure to covid-19: Secondary | ICD-10-CM | POA: Diagnosis not present

## 2020-10-31 DIAGNOSIS — J029 Acute pharyngitis, unspecified: Secondary | ICD-10-CM | POA: Diagnosis not present

## 2020-11-01 ENCOUNTER — Ambulatory Visit: Payer: Federal, State, Local not specified - PPO | Admitting: Family Medicine

## 2020-11-01 ENCOUNTER — Other Ambulatory Visit: Payer: Self-pay

## 2020-11-01 ENCOUNTER — Encounter: Payer: Self-pay | Admitting: Family Medicine

## 2020-11-01 VITALS — BP 110/78 | HR 87 | Temp 99.0°F | Ht 63.0 in | Wt 204.2 lb

## 2020-11-01 DIAGNOSIS — J029 Acute pharyngitis, unspecified: Secondary | ICD-10-CM | POA: Diagnosis not present

## 2020-11-01 DIAGNOSIS — H6592 Unspecified nonsuppurative otitis media, left ear: Secondary | ICD-10-CM

## 2020-11-01 MED ORDER — PREDNISONE 20 MG PO TABS: 40.0000 mg | ORAL_TABLET | Freq: Every day | ORAL | 0 refills | Status: AC

## 2020-11-01 MED ORDER — LEVOCETIRIZINE DIHYDROCHLORIDE 5 MG PO TABS: 5.0000 mg | ORAL_TABLET | Freq: Every evening | ORAL | 2 refills | Status: DC

## 2020-11-01 NOTE — Addendum Note (Signed)
Addended by: Sharon Seller B on: 11/01/2020 03:50 PM   Modules accepted: Orders

## 2020-11-01 NOTE — Progress Notes (Signed)
SUBJECTIVE:   Julie Bishop is a 50 y.o. female presents to the clinic for:  Chief Complaint  Patient presents with  . Hospitalization Follow-up     ED followup.  . Sore Throat    2 home covid test/negative [facility] test negative 4th test Covid negative 2--strep test -- negative     Complains of sore throat for 6 days.  Other associated symptoms: sinus congestion, rhinorrhea, ear pain, sore throat and coughing.  Denies: sinus pain, itchy watery eyes, ear drainage, wheezing, shortness of breath, myalgia and fevers Sick Contacts: some extended fam members w URI s/s's Therapy to date: Tylenol, throat lozenges, throat spray  Social History   Tobacco Use  Smoking Status Never Smoker  Smokeless Tobacco Never Used    Patient's medications, allergies, past medical, surgical, social and family histories were reviewed and updated as appropriate.  OBJECTIVE:  BP 110/78 (BP Location: Left Arm, Patient Position: Sitting, Cuff Size: Normal)   Pulse 87   Temp 99 F (37.2 C) (Oral)   Ht 5\' 3"  (1.6 m)   Wt 204 lb 4 oz (92.6 kg)   SpO2 97%   BMI 36.18 kg/m  General: Awake, alert, appearing stated age Eyes: conjunctivae and sclerae clear Ears: normal TM on R, serous fluid w slight bulging on the L Nose: no visible exudate Oropharynx: lips, mucosa, and tongue normal; teeth and gums normal and b/l tonsilliths, no erythema of tonsils Neck: supple, no significant adenopathy Lungs: clear to auscultation, no wheezes, rales or rhonchi, symmetric air entry, normal effort Heart: RRR Skin:reveals no rash Psych: Age appropriate judgment and insight  ASSESSMENT/PLAN:  Sore throat - Plan: predniSONE (DELTASONE) 20 MG tablet, levocetirizine (XYZAL) 5 MG tablet  Fluid level behind tympanic membrane of left ear - Plan: predniSONE (DELTASONE) 20 MG tablet, levocetirizine (XYZAL) 5 MG tablet  Start PO antihist. Throat cx. Add Xyzal.  Continue to practice good hand hygiene and push  fluids. 5 d pred burst and acetaminophen for pain. F/u prn. Pt voiced understanding and agreement to the plan.  [place], DO 11/01/20 11:53 AM

## 2020-11-01 NOTE — Patient Instructions (Signed)
Consider throat lozenges, salt water gargles and an air humidifier for symptomatic care.   OK to take Tylenol 1000 mg (2 extra strength tabs) or 975 mg (3 regular strength tabs) every 6 hours as needed.  To dry up the nose: Claritin (loratadine), Allegra (fexofenadine), Zyrtec (cetirizine) which is also equivalent to Xyzal (levocetirizine); these are listed in order from weakest to strongest. Generic, and therefore cheaper, options are in the parentheses.   There are available OTC, and the generic versions, which may be cheaper, are in parentheses. Show this to a pharmacist if you have trouble finding any of these items.  Give [place] 2-3 days to get the results of your culture back.   Let us know if you need anything.

## 2020-11-03 LAB — CULTURE, GROUP A STREP
MICRO NUMBER:: 11978250
SPECIMEN QUALITY:: ADEQUATE

## 2020-11-07 ENCOUNTER — Ambulatory Visit: Payer: Federal, State, Local not specified - PPO | Admitting: Family Medicine

## 2020-11-07 ENCOUNTER — Telehealth: Payer: Self-pay

## 2020-11-07 ENCOUNTER — Encounter: Payer: Self-pay | Admitting: Family Medicine

## 2020-11-07 ENCOUNTER — Other Ambulatory Visit: Payer: Self-pay

## 2020-11-07 VITALS — BP 110/68 | HR 68 | Temp 98.4°F | Ht 63.0 in | Wt 203.5 lb

## 2020-11-07 DIAGNOSIS — J029 Acute pharyngitis, unspecified: Secondary | ICD-10-CM

## 2020-11-07 DIAGNOSIS — R059 Cough, unspecified: Secondary | ICD-10-CM

## 2020-11-07 MED ORDER — FLUCONAZOLE 150 MG PO TABS: ORAL_TABLET | ORAL | 0 refills | Status: DC

## 2020-11-07 MED ORDER — FLUTICASONE PROPIONATE 50 MCG/ACT NA SUSP: 2.0000 | Freq: Every day | NASAL | 6 refills | Status: DC

## 2020-11-07 MED ORDER — AMOXICILLIN-POT CLAVULANATE 875-125 MG PO TABS: 1.0000 | ORAL_TABLET | Freq: Two times a day (BID) | ORAL | 0 refills | Status: DC

## 2020-11-07 NOTE — Telephone Encounter (Signed)
Pt has been scheduled and addressed via MyChart.    Patient Name: Julie Bishop Gender: Female DOB: Nov 09, 1970 Age: 50 Y 11 M 2 D Return Phone Number: [phone] (Primary) Client [facility] Night - Client Client Site [facility] - Night Physician Penni Homans - MD Contact Type Call Who Is Calling Patient / Member / Family / Caregiver Call Type Triage / Clinical Relationship To Patient Self Return Phone Number [phone] (Primary) Chief Complaint Sore Throat Reason for Call Symptomatic / Request for Health Information Initial Comment Caller asks to speak to Dr. Frederik Pear RN. Was seen last week by Dr. Viona Gilmore. for sore throat, cough. Finished round of prednisone and sxs came raging back right. Phlem is making her nauseous. Translation No Nurse Assessment Nurse: Rodney Cruise, RN, [place] Date/Time (Eastern Time): 11/07/2020 7:48:55 AM Confirm and document reason for call. If symptomatic, describe symptoms. ---Caller states finished prednisone on Saturday, sore throat is back, still has cough, has a lot of phlegm, denies fever, has also been taking an allergy medication. Started a week ago Thursday. Tylenol for symptoms also. Does the patient have any new or worsening symptoms? ---Yes Will a triage be completed? ---Yes Related visit to physician within the last 2 weeks? ---Yes Does the PT have any chronic conditions? (i.e. diabetes, asthma, this includes High risk factors for pregnancy, etc.) ---Yes List chronic conditions. ---Detached retina. Is the patient pregnant or possibly pregnant? (Ask all females between the ages of 51-55) ---No Is this a behavioral health or substance abuse call? ---No Guidelines Guideline Title Affirmed Question Affirmed Notes Nurse Date/Time Eilene Ghazi Time) Sore Throat Earache also present [place], RN, Sean 11/07/2020 7:53:11 AM Disp. Time Eilene Ghazi Time) Disposition Final User

## 2020-11-07 NOTE — Progress Notes (Signed)
Chief Complaint  Patient presents with   Sore Throat   Cough    Julie Bishop here for URI complaints.  Duration:  12  days  Associated symptoms: ear pain, sore throat, and cough Denies: sinus congestion, sinus pain, rhinorrhea, itchy watery eyes, ear drainage, wheezing, shortness of breath, myalgia, and fevers Treatment to date: Pred burst, throat lozenges/spray, Tylenol, Xyzal Pred helped with the throat but pain returned when she finished yesterday.  Sick contacts: No  Past Medical History:  Diagnosis Date   Adjustment disorder 01/25/2017   Anemia    Anxiety and depression    Back pain    Cancer ([place]) 2008   skin cancer, BCC   Constipation 11/14/2014   Deep vein blood clot of left lower extremity ([place]) 12/2019   Depression    Diabetes mellitus without complication (HCC)    Generalized anxiety disorder    GERD (gastroesophageal reflux disease)    Headache 08/24/2015   History of cardiovascular stress test    GXT 3/19: No ischemic ECG changes   History of echocardiogram    Echo 2/18:  EF 50-55, GLS -20% (normal), normal wall motion, grade 1 diastolic dysfunction, trivial MR, mild RVE, normal RV SF, trivial TR, PASP 23   Hyperlipidemia    Elevated, but never taken medication   Hypertension    Insomnia 08/24/2015   Obesity 11/14/2014   OSA (obstructive sleep apnea) 09/12/2016   Pain in joint, shoulder region 11/27/2015   Palpitations    Post-menopause 08/23/2015   Raynauds syndrome    Sleep apnea    appt with pulmonary Dr in march 2018   Vitamin B12 deficiency 08/23/2015   Vitamin D deficiency 08/23/2015    BP 110/68 (BP Location: Left Arm, Patient Position: Sitting, Cuff Size: Normal)   Pulse 68   Temp 98.4 F (36.9 C) (Oral)   Ht 5\' 3"  (1.6 m)   Wt 203 lb 8 oz (92.3 kg)   SpO2 97%   BMI 36.05 kg/m  General: Awake, alert, appears stated age HEENT: AT, [place], ears patent b/l and TM's neg, nares patent w/o discharge, pharynx pink and without exudates, MMM Neck: No  masses or asymmetry Heart: RRR Lungs: CTAB, no accessory muscle use Psych: Age appropriate judgment and insight, normal mood and affect  Cough - Plan: amoxicillin-clavulanate (AUGMENTIN) 875-125 MG tablet, fluconazole (DIFLUCAN) 150 MG tablet  Sore throat - Plan: fluticasone (FLONASE) 50 MCG/ACT nasal spray, amoxicillin-clavulanate (AUGMENTIN) 875-125 MG tablet, fluconazole (DIFLUCAN) 150 MG tablet  Augmentin to cover for possible strep. INCS. Cont Xyzal. If no improvement, would consider adding Singulair.  F/u in 10 d w Dr. Charlett Blake or me if she is not available. If starting to experience fevers, shaking, or shortness of breath, seek immediate care. Pt voiced understanding and agreement to the plan.  [place], DO 11/07/20 2:30 PM

## 2020-11-07 NOTE — Patient Instructions (Signed)
Add Flonase to your regimen. It is over the counter but we have sent it in.  Cancel the appointment if you are doing better.  Let us know if you need anything.

## 2020-11-21 ENCOUNTER — Ambulatory Visit: Payer: Federal, State, Local not specified - PPO | Admitting: Family Medicine

## 2020-12-15 ENCOUNTER — Telehealth (INDEPENDENT_AMBULATORY_CARE_PROVIDER_SITE_OTHER): Payer: Federal, State, Local not specified - PPO | Admitting: Family Medicine

## 2020-12-15 ENCOUNTER — Encounter: Payer: Self-pay | Admitting: Family Medicine

## 2020-12-15 VITALS — Temp 97.0°F

## 2020-12-15 DIAGNOSIS — R0981 Nasal congestion: Secondary | ICD-10-CM | POA: Diagnosis not present

## 2020-12-15 DIAGNOSIS — R059 Cough, unspecified: Secondary | ICD-10-CM | POA: Diagnosis not present

## 2020-12-15 DIAGNOSIS — Z20822 Contact with and (suspected) exposure to covid-19: Secondary | ICD-10-CM | POA: Diagnosis not present

## 2020-12-15 NOTE — Progress Notes (Signed)
Virtual Visit via Video Note  I connected with Julie Bishop  on 12/15/20 at  4:00 PM EDT by a video enabled telemedicine application and verified that I am speaking with the correct person using two identifiers.  Location patient: home, [place] Location provider:work or home office Persons participating in the virtual visit: patient, provider  I discussed the limitations of evaluation and management by telemedicine and the availability of in person appointments. The patient expressed understanding and agreed to proceed.   HPI:  Acute telemedicine visit for sinus issues: -Onset: 2 days ago -did a covid test today but results are not back yet -Symptoms include: sore throat, nasal congestion, cough -Denies:CP, SOB, fever, NVD, inability to eat/drink/get out of bed, no known sick contacts -Pertinent past medical history: see below -Pertinent medication allergies: Allergies  Allergen Reactions   Losartan Other (See Comments)    Diarrhea and abodminal cramping   Oxycodone Itching  -COVID-19 vaccine status: vaccinated x2 and had 1 booster  ROS: See pertinent positives and negatives per HPI.  Past Medical History:  Diagnosis Date   Adjustment disorder 01/25/2017   Anemia    Anxiety and depression    Back pain    Cancer ([place]) 2008   skin cancer, BCC   Constipation 11/14/2014   Deep vein blood clot of left lower extremity ([place]) 12/2019   Depression    Diabetes mellitus without complication (HCC)    Generalized anxiety disorder    GERD (gastroesophageal reflux disease)    Headache 08/24/2015   History of cardiovascular stress test    GXT 3/19: No ischemic ECG changes   History of echocardiogram    Echo 2/18:  EF 50-55, GLS -20% (normal), normal wall motion, grade 1 diastolic dysfunction, trivial MR, mild RVE, normal RV SF, trivial TR, PASP 23   Hyperlipidemia    Elevated, but never taken medication   Hypertension    Insomnia 08/24/2015   Obesity 11/14/2014   OSA (obstructive sleep apnea)  09/12/2016   Pain in joint, shoulder region 11/27/2015   Palpitations    Post-menopause 08/23/2015   Raynauds syndrome    Sleep apnea    appt with pulmonary Dr in march 2018   Vitamin B12 deficiency 08/23/2015   Vitamin D deficiency 08/23/2015    Past Surgical History:  Procedure Laterality Date   ABDOMINAL HYSTERECTOMY  2010   BREAST BIOPSY     right, 1996   ECTOPIC PREGNANCY SURGERY     RETINAL DETACHMENT SURGERY Right    SHOULDER OPEN ROTATOR CUFF REPAIR  2017   right   TUBAL LIGATION     on right   TYMPANOSTOMY TUBE PLACEMENT       Current Outpatient Medications:    acetaminophen (TYLENOL) 500 MG tablet, Take 1,000 mg by mouth every 6 (six) hours as needed. , Disp: , Rfl:    atorvastatin (LIPITOR) 10 MG tablet, Take 1 tablet (10 mg total) by mouth daily. Please keep upcoming appt in August 2022 before anymore refills. Thank you Final Attempt, Disp: 90 tablet, Rfl: 0   cholecalciferol (VITAMIN D3) 25 MCG (1000 UNIT) tablet, Take 2,000 Units by mouth daily., Disp: , Rfl:    DULoxetine (CYMBALTA) 30 MG capsule, Take 1 capsule (30 mg total) by mouth 3 (three) times daily., Disp: 270 capsule, Rfl: 1   ELIQUIS 5 MG TABS tablet, Take 1 tablet (5 mg total) by mouth 2 (two) times daily., Disp: 60 tablet, Rfl: 4   Enalapril-hydroCHLOROthiazide 5-12.5 MG tablet, TAKE ONE TABLET BY MOUTH  DAILY, Disp: 90 tablet, Rfl: 1   Ferrous Fumarate-Folic Acid 449-2 MG TABS, Take 1 tablet by mouth daily., Disp: 30 tablet, Rfl: 2   LORazepam (ATIVAN) 0.5 MG tablet, TAKE ONE TABLET BY MOUTH TWICE A DAY AS NEEDED FOR ANXIETY, Disp: 30 tablet, Rfl: 1   metoprolol succinate (TOPROL-XL) 50 MG 24 hr tablet, Take 1 tablet (50 mg total) by mouth 2 (two) times daily. Take with or immediately following a meal., Disp: 180 tablet, Rfl: 1  EXAM:  VITALS per patient if applicable:  GENERAL: alert, oriented, appears well and in no acute distress  HEENT: atraumatic, conjunttiva clear, no obvious abnormalities on  inspection of external nose and ears  NECK: normal movements of the head and neck  LUNGS: on inspection no signs of respiratory distress, breathing rate appears normal, no obvious gross SOB, gasping or wheezing  CV: no obvious cyanosis  MS: moves all visible extremities without noticeable abnormality  PSYCH/NEURO: pleasant and cooperative, no obvious depression or anxiety, speech and thought processing grossly intact  ASSESSMENT AND PLAN:  Discussed the following assessment and plan:  Nasal congestion  Cough  -we discussed possible serious and likely etiologies, options for evaluation and workup, limitations of telemedicine visit vs in person visit, treatment, treatment risks and precautions. Pt prefers to treat via telemedicine empirically rather than in person at this moment.  Query viral upper respiratory illness, COVID-19 versus other.  COVID testing is pending.  Did discuss rapid antigen testing options as well.  Advised her of COVID treatment window and advised if she wanted treatment if she gets a positive test, to schedule follow-up video visit.  In the interim, nasal saline, analgesics as needed, she has Tessalon at home and prefers to use that for her cough.  Other symptomatic care measures, and isolation recommendations if she gets a positive COVID test or provided and summarized in patient instructions. Work/School slipped offered:  declined Follow-up: Advised to seek prompt in person care if worsening, new symptoms arise, or if is not improving with treatment. Discussed options for inperson care if PCP office not available. Did let this patient know that I only do telemedicine on Tuesdays and Thursdays for [facility] [facility]. Advised to schedule follow up visit with PCP or [facility] if any further questions or concerns to avoid delays in care.   I discussed the assessment and treatment plan with the patient. The patient was provided an opportunity to ask questions and all were answered. The  patient agreed with the plan and demonstrated an understanding of the instructions.     Lucretia Kern, DO

## 2020-12-15 NOTE — Patient Instructions (Addendum)
  HOME CARE TIPS:  -[facility] COVID19 testing information: [email or web address] OR [phone] Most pharmacies also offer testing and home test kits. If the Covid19 test is positive, please make a prompt follow up visit with your primary care office or with [facility] to discuss treatment options. Treatments for Covid19 are best given early in the course of the illness.   -can use tylenol or aleve if needed for fevers, aches and pains per instructions  -can use nasal saline a few times per day if you have nasal congestion; sometimes  a short course of Afrin nasal spray for 3 days can help with symptoms as well  -stay hydrated, drink plenty of fluids and eat small healthy meals - avoid dairy  -can take 1000 IU (25mcg) Vit D3 and 100-500 mg of Vit C daily per instructions  -If the Covid test is positive, check out the CDC website for more information on home care, transmission and treatment for COVID19  -follow up with your doctor in 2-3 days unless improving and feeling better  -stay home while sick, except to seek medical care. If you have COVID19, ideally it would be best to stay home for a full 10 days since the onset of symptoms PLUS one day of no fever and feeling better. Wear a good mask that fits snugly (such as N95 or KN95) if around others to reduce the risk of transmission.  It was nice to meet you today, and I really hope you are feeling better soon. I help [facility] out with telemedicine visits on Tuesdays and Thursdays and am available for visits on those days. If you have any concerns or questions following this visit please schedule a follow up visit with your Primary Care doctor or seek care at a local urgent care clinic to avoid delays in care.    Seek in person care or schedule a follow up video visit promptly if your symptoms worsen, new concerns arise or you are not improving with treatment. Call 911 and/or seek emergency care if your  symptoms are severe or life threatening.   

## 2020-12-16 ENCOUNTER — Encounter: Payer: Self-pay | Admitting: Family Medicine

## 2020-12-16 ENCOUNTER — Other Ambulatory Visit: Payer: Self-pay | Admitting: Family Medicine

## 2020-12-16 MED ORDER — ALBUTEROL SULFATE HFA 108 (90 BASE) MCG/ACT IN AERS: 2.0000 | INHALATION_SPRAY | Freq: Four times a day (QID) | RESPIRATORY_TRACT | 1 refills | Status: DC | PRN

## 2020-12-19 ENCOUNTER — Ambulatory Visit (HOSPITAL_BASED_OUTPATIENT_CLINIC_OR_DEPARTMENT_OTHER)
Admission: RE | Admit: 2020-12-19 | Discharge: 2020-12-19 | Disposition: A | Discharge status: Home / Self Care | Payer: Federal, State, Local not specified - PPO | Source: Ambulatory Visit | Attending: Family Medicine | Admitting: Family Medicine

## 2020-12-19 ENCOUNTER — Other Ambulatory Visit: Payer: Self-pay | Admitting: Family Medicine

## 2020-12-19 ENCOUNTER — Ambulatory Visit: Payer: Federal, State, Local not specified - PPO | Admitting: Family Medicine

## 2020-12-19 ENCOUNTER — Other Ambulatory Visit: Payer: Self-pay

## 2020-12-19 ENCOUNTER — Encounter: Payer: Self-pay | Admitting: Family Medicine

## 2020-12-19 VITALS — BP 118/78 | HR 72 | Temp 98.3°F | Resp 18 | Wt 192.2 lb

## 2020-12-19 DIAGNOSIS — E6609 Other obesity due to excess calories: Secondary | ICD-10-CM | POA: Diagnosis not present

## 2020-12-19 DIAGNOSIS — I10 Essential (primary) hypertension: Secondary | ICD-10-CM

## 2020-12-19 DIAGNOSIS — Z1152 Encounter for screening for COVID-19: Secondary | ICD-10-CM

## 2020-12-19 DIAGNOSIS — R059 Cough, unspecified: Secondary | ICD-10-CM

## 2020-12-19 DIAGNOSIS — R739 Hyperglycemia, unspecified: Secondary | ICD-10-CM | POA: Diagnosis not present

## 2020-12-19 DIAGNOSIS — E559 Vitamin D deficiency, unspecified: Secondary | ICD-10-CM

## 2020-12-19 DIAGNOSIS — Z6839 Body mass index (BMI) 39.0-39.9, adult: Secondary | ICD-10-CM

## 2020-12-19 DIAGNOSIS — K219 Gastro-esophageal reflux disease without esophagitis: Secondary | ICD-10-CM

## 2020-12-19 DIAGNOSIS — R0602 Shortness of breath: Secondary | ICD-10-CM

## 2020-12-19 MED ORDER — METHYLPREDNISOLONE 4 MG PO TABS: ORAL_TABLET | ORAL | 0 refills | Status: DC

## 2020-12-19 MED ORDER — AZITHROMYCIN 250 MG PO TABS: ORAL_TABLET | ORAL | 0 refills | Status: AC

## 2020-12-19 MED ORDER — PROMETHAZINE-DM 6.25-15 MG/5ML PO SYRP: 2.5000 mL | ORAL_SOLUTION | Freq: Three times a day (TID) | ORAL | 1 refills | Status: DC | PRN

## 2020-12-19 NOTE — Patient Instructions (Addendum)
Paxlovid or Molnupiravir are the new COVID medicaitons we can give you if you get COVID so make sure you test if you have symptoms because we have to treat by day 5 of symptoms for it to be effective. If you are positive let us know so we can treat. If a home test is negative and your symptoms are persistent get a PCR test. Can check testing locations at [email or web address] If you are positive we will make an appointment with [place] and we will send in Paxlovid if you would like it. Check with your pharmacy before we meet to confirm they have it in stock, if they do not then we can get the prescription at the Center    Bronchospasm, Adult  Bronchospasm is a tightening of the smooth muscle that wraps around the small airways in the lungs. When the muscle tightens, the small airways narrow. Narrowed airways limit the air you breathe in or out of your lungs. Inflammation (swelling) and more mucus (sputum) than usual can further irritate the airways. This can make it very hard tobreathe. Bronchospasm can happen suddenly or over a period of time. What are the causes? Common causes of this condition include: An infection, such as a cold or sinus drainage. Exercise. Strong odors from aerosol sprays, and fumes from perfume, candles, and household cleaners. Cold air. Stress or strong emotions such as crying or laughing. What increases the risk? The following factors may make you more likely to develop this condition: Having asthma. Smoking or being around someone who smokes (secondhand smoke). Seasonal allergies, such as pollen or mold. Allergic reaction (anaphylaxis) to food, medicine, or insect bites or stings. What are the signs or symptoms? Symptoms of this condition include: Making a whistling sound when you breathe (wheezing). Coughing. Chest tightness. Shortness of breath. Decreased ability to exercise. Noisy breathing or a high-pitched cough. How is this diagnosed? This  condition may be diagnosed based on your medical history and a physical exam. Your health care provider may also perform tests, including: A chest X-ray. Lung function tests. How is this treated? This condition may be treated by: Using inhaled medicines. These open up (relax) the airways and help you breathe. They can be taken with a metered dose inhaler or a nebulizer device. Taking corticosteroid medicines. These may be given to reduce inflammation and swelling. Removing the irritant or trigger that started the bronchospasm. Follow these instructions at home: Medicines Take over-the-counter and prescription medicines only as told by your health care provider. If you need to use an inhaler or nebulizer to take your medicine, ask your health care provider how to use it correctly. You may be given a spacer to use with your inhaler. This makes it easier to get the medicine from the inhaler into your lungs. Lifestyle Do not use any products that contain nicotine or tobacco, such as cigarettes, e-cigarettes, and chewing tobacco. If you need help quitting, ask your health care provider. Keep track of things that trigger your bronchospasm. Avoid these if possible. When pollen, air pollution, or humidity levels are bad, keep windows closed and use an air conditioner or go to places that have air conditioning. Find ways to manage stress and your emotions, such as mindfulness, relaxation, or breathing exercises. Activity Some people have bronchospasm when they exercise. This is called exercise-induced bronchoconstriction (EIB). If you have this problem, talk with your health care provider about how to manage EIB. Some tips include: Use your fast-acting inhaler before exercise. Exercise  indoors if it is very cold, humid, or if the pollen and mold counts are high. Warm up and cool down before and after exercise. Stop exercising right away if your symptoms start or get worse. General instructions If you  have asthma, make sure you have an asthma action plan. Stay up to date on your immunizations. Keep all follow-up visits as told by your health care provider. This is important. Get help right away if: You have trouble breathing. Your wheezing and coughing do not get better after taking your medicine. You have chest pain. You have trouble speaking more than one-word sentences. These symptoms may represent a serious problem that is an emergency. Do not wait to see if the symptoms will go away. Get medical help right away. Call your local emergency services (911 in the [place] Do not drive yourself to the hospital. Summary Bronchospasm is a tightening of the smooth muscle that wraps around the small airways in the lungs. Some people have bronchospasm when they exercise. This is called exercise-induced bronchoconstriction (EIB). If you have this problem, talk with your health care provider about how to manage EIB. Do not use any products that contain nicotine or tobacco, such as cigarettes, e-cigarettes, and chewing tobacco. Get help right away if your wheezing and coughing do not get better after taking your medicine. This information is not intended to replace advice given to you by your health care provider. Make sure you discuss any questions you have with your healthcare provider. Document Revised: 06/23/2019 Document Reviewed: 06/23/2019 Elsevier Patient Education  2022 [employer].

## 2020-12-19 NOTE — Telephone Encounter (Signed)
Pt aware and will be seen at 4pm today

## 2020-12-19 NOTE — Progress Notes (Signed)
Subjective:   By signing my name below, I, Julie Bishop, attest that this documentation has been prepared under the direction and in the presence of Julie Lukes, MD 12/19/2020   Patient ID: Julie Bishop, female    DOB: [birth year], 50 y.o.   MRN: [id number]  Chief Complaint  Patient presents with   Wheezing   Cough    HPI Patient is in today for an office visit. She came in complaining of coughing and wheezing with a tightness in her chest. She experiences coughing fits at night while sleeping. She has tested 4 times for Covid-19 and they have all been negative. It started on Tuesday with rhinorrhea, sneezing and then progressed to a productive cough.  Her chest X-ray does not show pneumonia or bronchitis. She denies urinary problems, constipation, fever and chest pains.    Past Medical History:  Diagnosis Date   Adjustment disorder 01/25/2017   Anemia    Anxiety and depression    Back pain    Cancer ([place]) 2008   skin cancer, BCC   Constipation 11/14/2014   Deep vein blood clot of left lower extremity ([place]) 12/2019   Depression    Diabetes mellitus without complication (HCC)    Generalized anxiety disorder    GERD (gastroesophageal reflux disease)    Headache 08/24/2015   History of cardiovascular stress test    GXT 3/19: No ischemic ECG changes   History of echocardiogram    Echo 2/18:  EF 50-55, GLS -20% (normal), normal wall motion, grade 1 diastolic dysfunction, trivial MR, mild RVE, normal RV SF, trivial TR, PASP 23   Hyperlipidemia    Elevated, but never taken medication   Hypertension    Insomnia 08/24/2015   Obesity 11/14/2014   OSA (obstructive sleep apnea) 09/12/2016   Pain in joint, shoulder region 11/27/2015   Palpitations    Post-menopause 08/23/2015   Raynauds syndrome    Sleep apnea    appt with pulmonary Dr in march 2018   Vitamin B12 deficiency 08/23/2015   Vitamin D deficiency 08/23/2015    Past Surgical History:  Procedure Laterality Date    ABDOMINAL HYSTERECTOMY  2010   BREAST BIOPSY     right, 1996   ECTOPIC PREGNANCY SURGERY     RETINAL DETACHMENT SURGERY Right    SHOULDER OPEN ROTATOR CUFF REPAIR  2017   right   TUBAL LIGATION     on right   TYMPANOSTOMY TUBE PLACEMENT      Family History  Problem Relation Age of Onset   Diabetes Mother    Depression Mother    Hypertension Mother    Hyperlipidemia Mother    Liver disease Mother    Sleep apnea Mother    Obesity Mother    Heart attack Mother 68       CAD w/MI in mid 31s; s/p PCI   Diabetes Father    Hypertension Father    Cancer Maternal Grandmother        throat   Heart attack Maternal Grandmother    Cancer Paternal Grandmother        lung   Heart disease Paternal Grandmother    Hypertension Sister        as a teenager    Social History   Socioeconomic History   Marital status: Married    Spouse name: Julie Bishop   Number of children: 1   Years of education: 12   Highest education level: Not on file  Occupational History  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[employer]  Tobacco Use   Smoking status: Never   Smokeless tobacco: Never  Vaping Use   Vaping Use: Never used  Substance and Sexual Activity   Alcohol use: No    Alcohol/week: 0.0 standard drinks   Drug use: No   Sexual activity: Yes    Partners: Male    Birth control/protection: Post-menopausal    Comment: 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[place], Charity fundraiser., lives with husband  Other Topics Concern   Not on file  Social History Narrative   Not on file   Social Determinants of Health   Financial Resource Strain: Not on file  Food Insecurity: No Food Insecurity   Worried About Charity fundraiser in the Last Year: Never true   Arboriculturist in the Last Year: Never true  Transportation Needs: Not on file  Physical Activity: Not on file  Stress: Not on file  Social Connections: Not on file  Intimate Partner Violence: Not on file    Outpatient Medications  Prior to Visit  Medication Sig Dispense Refill   acetaminophen (TYLENOL) 500 MG tablet Take 1,000 mg by mouth every 6 (six) hours as needed.      albuterol (VENTOLIN HFA) 108 (90 Base) MCG/ACT inhaler Inhale 2 puffs into the lungs every 6 (six) hours as needed for wheezing or shortness of breath. 18 g 1   atorvastatin (LIPITOR) 10 MG tablet Take 1 tablet (10 mg total) by mouth daily. Please keep upcoming appt in August 2022 before anymore refills. Thank you Final Attempt 90 tablet 0   cholecalciferol (VITAMIN D3) 25 MCG (1000 UNIT) tablet Take 2,000 Units by mouth daily.     DULoxetine (CYMBALTA) 30 MG capsule Take 1 capsule (30 mg total) by mouth 3 (three) times daily. 270 capsule 1   ELIQUIS 5 MG TABS tablet Take 1 tablet (5 mg total) by mouth 2 (two) times daily. 60 tablet 4   Enalapril-hydroCHLOROthiazide 5-12.5 MG tablet TAKE ONE TABLET BY MOUTH DAILY 90 tablet 1   Ferrous Fumarate-Folic Acid 99991111 MG TABS Take 1 tablet by mouth daily. 30 tablet 2   LORazepam (ATIVAN) 0.5 MG tablet TAKE ONE TABLET BY MOUTH TWICE A DAY AS NEEDED FOR ANXIETY 30 tablet 1   metoprolol succinate (TOPROL-XL) 50 MG 24 hr tablet Take 1 tablet (50 mg total) by mouth 2 (two) times daily. Take with or immediately following a meal. 180 tablet 1   No facility-administered medications prior to visit.    Allergies  Allergen Reactions   Losartan Other (See Comments)    Diarrhea and abodminal cramping   Oxycodone Itching    Review of Systems  HENT:  Positive for congestion.        (+) rhinorrhea  Respiratory:  Positive for cough, sputum production and wheezing.       Objective:    Physical Exam Constitutional:      General: She is not in acute distress. HENT:     Head: Normocephalic and atraumatic.     Right Ear: External ear normal.     Left Ear: External ear normal.  Eyes:     Conjunctiva/sclera: Conjunctivae normal.  Cardiovascular:     Rate and Rhythm: Normal rate and regular rhythm.     Heart  sounds: Normal heart sounds. No murmur heard. Pulmonary:     Effort: No respiratory distress.     Breath sounds: Examination of the right-upper field reveals wheezing. Examination of the left-upper  field reveals wheezing. Examination of the right-middle field reveals wheezing. Examination of the left-middle field reveals wheezing. Examination of the right-lower field reveals wheezing. Examination of the left-lower field reveals wheezing. Wheezing present.  Musculoskeletal:     Cervical back: Neck supple.  Lymphadenopathy:     Cervical: No cervical adenopathy.  Skin:    General: Skin is warm and dry.  Neurological:     Mental Status: She is alert and oriented to person, place, and time.  Psychiatric:        Behavior: Behavior normal.    BP 118/78   Pulse 72   Temp 98.3 F (36.8 C)   Resp 18   Wt 192 lb 3.2 oz (87.2 kg)   SpO2 99%   BMI 34.05 kg/m  Wt Readings from Last 3 Encounters:  12/19/20 192 lb 3.2 oz (87.2 kg)  11/07/20 203 lb 8 oz (92.3 kg)  11/01/20 204 lb 4 oz (92.6 kg)    Diabetic Foot Exam - Simple   No data filed    Lab Results  Component Value Date   WBC 6.4 09/19/2020   HGB 12.4 09/19/2020   HCT 36.0 09/19/2020   PLT 277.0 09/19/2020   GLUCOSE 109 (H) 09/19/2020   CHOL 134 09/19/2020   TRIG 72.0 09/19/2020   HDL 56.30 09/19/2020   LDLCALC 63 09/19/2020   ALT 27 09/19/2020   AST 18 09/19/2020   NA 140 09/19/2020   K 4.3 09/19/2020   CL 105 09/19/2020   CREATININE 0.67 09/19/2020   BUN 16 09/19/2020   CO2 26 09/19/2020   TSH 2.85 06/03/2020   HGBA1C 5.9 09/19/2020    Lab Results  Component Value Date   TSH 2.85 06/03/2020   Lab Results  Component Value Date   WBC 6.4 09/19/2020   HGB 12.4 09/19/2020   HCT 36.0 09/19/2020   MCV 82.0 09/19/2020   PLT 277.0 09/19/2020   Lab Results  Component Value Date   NA 140 09/19/2020   K 4.3 09/19/2020   CO2 26 09/19/2020   GLUCOSE 109 (H) 09/19/2020   BUN 16 09/19/2020   CREATININE 0.67  09/19/2020   BILITOT 0.4 09/19/2020   ALKPHOS 69 09/19/2020   AST 18 09/19/2020   ALT 27 09/19/2020   PROT 7.0 09/19/2020   ALBUMIN 4.0 09/19/2020   CALCIUM 9.6 09/19/2020   ANIONGAP 9 08/22/2020   GFR 102.61 09/19/2020   Lab Results  Component Value Date   CHOL 134 09/19/2020   Lab Results  Component Value Date   HDL 56.30 09/19/2020   Lab Results  Component Value Date   LDLCALC 63 09/19/2020   Lab Results  Component Value Date   TRIG 72.0 09/19/2020   Lab Results  Component Value Date   CHOLHDL 2 09/19/2020   Lab Results  Component Value Date   HGBA1C 5.9 09/19/2020       Assessment & Plan:   Problem List Items Addressed This Visit     GERD (gastroesophageal reflux disease)    Avoid offending foods, start probiotics. Do not eat large meals in late evening and consider raising head of bed.        Hypertension    Well controlled, no changes to meds. Encouraged heart healthy diet such as the DASH diet and exercise as tolerated.        Obesity    Maintain heart healthy decrease po intake and increase exercise as tolerated. Needs 7-8 hours of sleep nightly. Avoid trans fats, eat  small, frequent meals every 4-5 hours with lean proteins, complex carbs and healthy fats.        Vitamin D deficiency    Supplement and monitor       SOB (shortness of breath)    With recent cough and wheezing, clear rhinorrhea, scratchy throat and malaise. She notes some sneezing as well. covid testing has been negative. Prescribed Promethazine DM prn for cough, zpak and medrol dosepak. Encouraged increased rest and hydration, add probiotics, zinc such as Coldeze or Xicam. Treat fevers as needed       Hyperglycemia    hgba1c acceptable, minimize simple carbs. Increase exercise as tolerated.        Other Visit Diagnoses     Encounter for screening for COVID-19    -  Primary   Relevant Orders   Novel Coronavirus, NAA ([facility]) (Completed)       Meds ordered this  encounter  Medications   azithromycin (ZITHROMAX) 250 MG tablet    Sig: Take 2 tablets on day 1, then 1 tablet daily on days 2 through 5    Dispense:  6 tablet    Refill:  0   methylPREDNISolone (MEDROL) 4 MG tablet    Sig: 5 tabs po x 1 day then 4 tabs po x 1 day then 3 tabs po x 1 day then 2 tabs po x 1 day then 1 tab po x 1 day and stop    Dispense:  15 tablet    Refill:  0   promethazine-dextromethorphan (PROMETHAZINE-DM) 6.25-15 MG/5ML syrup    Sig: Take 2.5-5 mLs by mouth 3 (three) times daily as needed for cough.    Dispense:  180 mL    Refill:  1    I, Julie Lukes, MD , personally preformed the services described in this documentation.  All medical record entries made by the scribe were at my direction and in my presence.  I have reviewed the chart and discharge instructions (if applicable) and agree that the record reflects my personal performance and is accurate and complete 12/19/2020   I,Julie Bishop,acting as a scribe for Penni Homans, MD.,have documented all relevant documentation on the behalf of Penni Homans, MD,as directed by  Penni Homans, MD while in the presence of Penni Homans, MD.    Penni Homans, MD

## 2020-12-20 ENCOUNTER — Telehealth: Payer: Self-pay | Admitting: *Deleted

## 2020-12-20 LAB — SARS-COV-2, NAA 2 DAY TAT

## 2020-12-20 LAB — NOVEL CORONAVIRUS, NAA: SARS-CoV-2, NAA: NOT DETECTED

## 2020-12-20 NOTE — Telephone Encounter (Signed)
You wanted to see patient back in 6 weeks, which is the week of 01/30/21.  Can you look at your schedule to see if we can fit her in somewhere like at 1pm or 4pm.  You wanted a vv for her.

## 2020-12-20 NOTE — Telephone Encounter (Signed)
Patient scheduled.

## 2020-12-25 NOTE — Assessment & Plan Note (Signed)
With recent cough and wheezing, clear rhinorrhea, scratchy throat and malaise. She notes some sneezing as well. covid testing has been negative. Prescribed Promethazine DM prn for cough, zpak and medrol dosepak. Encouraged increased rest and hydration, add probiotics, zinc such as Coldeze or Xicam. Treat fevers as needed

## 2020-12-25 NOTE — Assessment & Plan Note (Signed)
Avoid offending foods, start probiotics. Do not eat large meals in late evening and consider raising head of bed.  

## 2020-12-25 NOTE — Assessment & Plan Note (Signed)
Maintain heart healthy decrease po intake and increase exercise as tolerated. Needs 7-8 hours of sleep nightly. Avoid trans fats, eat small, frequent meals every 4-5 hours with lean proteins, complex carbs and healthy fats.

## 2020-12-25 NOTE — Assessment & Plan Note (Signed)
Supplement and monitor 

## 2020-12-25 NOTE — Assessment & Plan Note (Signed)
hgba1c acceptable, minimize simple carbs. Increase exercise as tolerated.  

## 2020-12-25 NOTE — Assessment & Plan Note (Signed)
Well controlled, no changes to meds. Encouraged heart healthy diet such as the DASH diet and exercise as tolerated.  °

## 2020-12-28 ENCOUNTER — Encounter: Payer: Self-pay | Admitting: Physician Assistant

## 2020-12-28 ENCOUNTER — Ambulatory Visit: Payer: Federal, State, Local not specified - PPO | Admitting: Physician Assistant

## 2020-12-28 VITALS — BP 132/82 | HR 57 | Ht 63.0 in | Wt 189.2 lb

## 2020-12-28 DIAGNOSIS — Z86718 Personal history of other venous thrombosis and embolism: Secondary | ICD-10-CM | POA: Diagnosis not present

## 2020-12-28 DIAGNOSIS — E782 Mixed hyperlipidemia: Secondary | ICD-10-CM

## 2020-12-28 DIAGNOSIS — I10 Essential (primary) hypertension: Secondary | ICD-10-CM | POA: Diagnosis not present

## 2020-12-28 MED ORDER — ATORVASTATIN CALCIUM 10 MG PO TABS: 10.0000 mg | ORAL_TABLET | Freq: Every day | ORAL | 3 refills | Status: DC

## 2020-12-28 NOTE — Patient Instructions (Signed)
Medication Instructions:   Your physician recommends that you continue on your current medications as directed. Please refer to the Current Medication list given to you today.  *If you need a refill on your cardiac medications before your next appointment, please call your pharmacy*   Lab Work:  -NONE-   If you have labs (blood work) drawn today and your tests are completely normal, you will receive your results only by: [place] (if you have MyChart) OR A paper copy in the mail If you have any lab test that is abnormal or we need to change your treatment, we will call you to review the results.   Testing/Procedures:  -NONE-   Follow-Up: At [facility], you and your health needs are our priority.  As part of our continuing mission to provide you with exceptional heart care, we have created designated Provider Care Teams.  These Care Teams include your primary Cardiologist (physician) and Advanced Practice Providers (APPs -  Physician Assistants and Nurse Practitioners) who all work together to provide you with the care you need, when you need it.  We recommend signing up for the patient portal called "MyChart".  Sign up information is provided on this After Visit Summary.  MyChart is used to connect with patients for Virtual Visits (Telemedicine).  Patients are able to view lab/test results, encounter notes, upcoming appointments, etc.  Non-urgent messages can be sent to your provider as well.   To learn more about what you can do with MyChart, go to [email or web address].    Your next appointment:   1 year(s)  The format for your next appointment:   In Person  Provider:   Richardson Dopp, PA-C   Other Instructions Your physician wants you to follow-up in: 1 year with Richardson Dopp, PA-C.  You will receive a reminder letter in the mail two months in advance. If you don't receive a letter, please call our office to schedule the follow-up appointment.

## 2020-12-28 NOTE — Progress Notes (Signed)
Cardiology Office Note:    Date:  12/28/2020   ID:  [name], DOB 1971-05-03, MRN [id number]  PCP:  [name], MD   [facility] Providers Cardiologist:  Will Meredith Leeds, MD Cardiology APP:  [facility]      Referring MD: [name], MD   Chief Complaint:  Follow-up (HTN, HLP)    Patient Profile:   Julie Bishop is a 50 y.o. female with:  Hypertension  Hyperlipidemia OSA L leg DVT in 2021 Long term anticoagulation; followed by heme/onc GERD  Prior CV studies: Echocardiogram 07/22/19 EF 60-65, no RWMA, normal RVSF, RVSP 30.5, trivial MR  GXT 08/01/17 There was no ST segment deviation noted during stress. Clinically and electrically negative for ischemia. Blood pressure demonstrated a normal response to exercise.   Echo 07/04/16 EF 50-55, GLS -20% (normal), normal wall motion, grade 1 diastolic dysfunction, trivial MR, mild RVE, normal RV SF, trivial TR, PASP 23   History of Present Illness: [name] returns for Cardiology f/u.  She is here alone.  She is doing well without chest pain, shortness of breath, syncope, leg swelling.          Past Medical History:  Diagnosis Date   Adjustment disorder 01/25/2017   Anemia    Anxiety and depression    Back pain    Cancer ([place]) 2008   skin cancer, BCC   Constipation 11/14/2014   Deep vein blood clot of left lower extremity ([place]) 12/2019   Depression    Diabetes mellitus without complication (HCC)    Generalized anxiety disorder    GERD (gastroesophageal reflux disease)    Headache 08/24/2015   History of cardiovascular stress test    GXT 3/19: No ischemic ECG changes   History of echocardiogram    Echo 2/18:  EF 50-55, GLS -20% (normal), normal wall motion, grade 1 diastolic dysfunction, trivial MR, mild RVE, normal RV SF, trivial TR, PASP 23   Hyperlipidemia    Elevated, but never taken medication   Hypertension    Insomnia 08/24/2015   Obesity 11/14/2014   OSA  (obstructive sleep apnea) 09/12/2016   Pain in joint, shoulder region 11/27/2015   Palpitations    Post-menopause 08/23/2015   Raynauds syndrome    Sleep apnea    appt with pulmonary Dr in march 2018   Vitamin B12 deficiency 08/23/2015   Vitamin D deficiency 08/23/2015    Current Medications: Current Meds  Medication Sig   acetaminophen (TYLENOL) 500 MG tablet Take 1,000 mg by mouth every 6 (six) hours as needed.    cholecalciferol (VITAMIN D3) 25 MCG (1000 UNIT) tablet Take 2,000 Units by mouth daily.   DULoxetine (CYMBALTA) 30 MG capsule Take 1 capsule (30 mg total) by mouth 3 (three) times daily.   ELIQUIS 5 MG TABS tablet Take 1 tablet (5 mg total) by mouth 2 (two) times daily.   Enalapril-hydroCHLOROthiazide 5-12.5 MG tablet TAKE ONE TABLET BY MOUTH DAILY   Ferrous Fumarate-Folic Acid 99991111 MG TABS Take 1 tablet by mouth daily.   LORazepam (ATIVAN) 0.5 MG tablet TAKE ONE TABLET BY MOUTH TWICE A DAY AS NEEDED FOR ANXIETY   metoprolol succinate (TOPROL-XL) 50 MG 24 hr tablet Take 1 tablet (50 mg total) by mouth 2 (two) times daily. Take with or immediately following a meal.     Allergies:   Losartan and Oxycodone   Social History   Tobacco Use   Smoking status: Never   Smokeless tobacco: Never  Vaping Use   Vaping Use: Never used  Substance Use Topics   Alcohol use: No    Alcohol/week: 0.0 standard drinks   Drug use: No     Family Hx: The patient's family history includes Cancer in her maternal grandmother and paternal grandmother; Depression in her mother; Diabetes in her father and mother; Heart attack in her maternal grandmother; Heart attack (age of onset: 17) in her mother; Heart disease in her paternal grandmother; Hyperlipidemia in her mother; Hypertension in her father, mother, and sister; Liver disease in her mother; Obesity in her mother; Sleep apnea in her mother.  Review of Systems  Gastrointestinal:  Negative for hematochezia and melena.    EKGs/Labs/Other Test  Reviewed:    EKG:  EKG is   ordered today.  The ekg ordered today demonstrates sinus brady, HR 57, normal axis, non-specific ST-TW changes, QTc 399 ms no changes   Recent Labs: 06/03/2020: TSH 2.85 09/19/2020: ALT 27; BUN 16; Creatinine, Ser 0.67; Hemoglobin 12.4; Platelets 277.0; Potassium 4.3; Sodium 140   Recent Lipid Panel Lab Results  Component Value Date/Time   CHOL 134 09/19/2020 07:24 AM   CHOL 226 (H) 07/12/2016 10:58 AM   TRIG 72.0 09/19/2020 07:24 AM   HDL 56.30 09/19/2020 07:24 AM   HDL 73 07/12/2016 10:58 AM   LDLCALC 63 09/19/2020 07:24 AM   LDLCALC 56 06/03/2020 07:20 AM      Risk Assessment/Calculations:       Physical Exam:    VS:  BP 132/82   Pulse (!) 57   Ht '5\' 3"'$  (1.6 m)   Wt 189 lb 3.2 oz (85.8 kg)   SpO2 98%   BMI 33.52 kg/m     Wt Readings from Last 3 Encounters:  12/28/20 189 lb 3.2 oz (85.8 kg)  12/19/20 192 lb 3.2 oz (87.2 kg)  11/07/20 203 lb 8 oz (92.3 kg)     Constitutional:      Appearance: Healthy appearance. Not in distress.  Neck:     Vascular: No carotid bruit. JVD normal.  Pulmonary:     Effort: Pulmonary effort is normal.     Breath sounds: No wheezing. No rales.  Cardiovascular:     Normal rate. Regular rhythm. Normal S1. Normal S2.      Murmurs: There is no murmur.  Edema:    Peripheral edema absent.  Abdominal:     Palpations: Abdomen is soft.  Skin:    General: Skin is warm and dry.  Neurological:     Mental Status: Alert and oriented to person, place and time.     Cranial Nerves: Cranial nerves are intact.         ASSESSMENT & PLAN:    1. Essential hypertension BP is much better controlled.  She remains on Enalapril/HCTZ, Metoprolol Succinate.  She has been doing a great job losing weight.  At some point, we may be able to reduce/eliminate some of her medications.    2. Mixed hyperlipidemia Excellent control on Atorvastatin 10 mg once daily.   3. History of DVT (deep vein thrombosis) DVT occurred after  emergent ocular surgery for detached retina.  Managed by hematology.  She notes that she will need to be on Apixaban for 2 years.    Dispo:  Return in about 1 year (around 12/28/2021) for Routine follow up in 1 year with [facility] [place]. .   Medication Adjustments/Labs and Tests Ordered: Current medicines are reviewed at length with the patient today.  Concerns regarding medicines  are outlined above.  Tests Ordered: Orders Placed This Encounter  Procedures   EKG 12-Lead   Medication Changes: Meds ordered this encounter  Medications   atorvastatin (LIPITOR) 10 MG tablet    Sig: Take 1 tablet (10 mg total) by mouth daily.    Dispense:  90 tablet    Refill:  3    Order Specific Question:   Supervising Provider    Answer:   Lelon Perla [[id number]]    Signed, Richardson Dopp, PA-C  12/28/2020 4:35 PM    [facility] [address], [place], [place]  [address] Phone: [phone]; Fax: [phone]

## 2021-01-09 ENCOUNTER — Other Ambulatory Visit: Payer: Federal, State, Local not specified - PPO

## 2021-01-09 ENCOUNTER — Encounter: Payer: Self-pay | Admitting: Family Medicine

## 2021-01-09 ENCOUNTER — Telehealth: Payer: Self-pay | Admitting: *Deleted

## 2021-01-09 NOTE — Telephone Encounter (Signed)
Patient scheduled for 01/10/21 with Jodi Mourning.

## 2021-01-09 NOTE — Telephone Encounter (Signed)
Pt has lab appointment at 8:15 today.  I do not see any future lab orders in Epic.  Please place future orders in appropriate or call pt to cancel appt.

## 2021-01-10 ENCOUNTER — Telehealth (INDEPENDENT_AMBULATORY_CARE_PROVIDER_SITE_OTHER): Payer: Federal, State, Local not specified - PPO | Admitting: Family

## 2021-01-10 ENCOUNTER — Other Ambulatory Visit: Payer: Self-pay

## 2021-01-10 ENCOUNTER — Encounter: Payer: Self-pay | Admitting: Family

## 2021-01-10 VITALS — Ht 63.0 in | Wt 185.0 lb

## 2021-01-10 DIAGNOSIS — U071 COVID-19: Secondary | ICD-10-CM

## 2021-01-10 MED ORDER — MOLNUPIRAVIR EUA 200MG CAPSULE: 4.0000 | ORAL_CAPSULE | Freq: Two times a day (BID) | ORAL | 0 refills | Status: AC

## 2021-01-10 NOTE — Progress Notes (Signed)
Julie Bishop is a 50 y.o. female with the following history as recorded in [employer]:  Patient Active Problem List   Diagnosis Date Noted   IDA (iron deficiency anemia) 03/23/2020   Urinary incontinence 07/09/2019   Palpitations 07/09/2019   Hip pain, left 07/15/2018   Otitis media of left ear 01/29/2018   Hyperglycemia 05/02/2017   Adjustment disorder 01/25/2017   OSA (obstructive sleep apnea) 09/12/2016   SOB (shortness of breath) 07/01/2016   Chest pain 07/01/2016   Headache disorder 04/01/2016   Snoring 04/01/2016   Pain in joint, shoulder region 11/27/2015   Insomnia 08/24/2015   Vitamin D deficiency 08/23/2015   Vitamin B12 deficiency 08/23/2015   Preventative health care 08/23/2015   Post-menopause 08/23/2015   Polydipsia 12/08/2014   Constipation 11/14/2014   Obesity 11/14/2014   Anxiety and depression    Cancer (HCC)    GERD (gastroesophageal reflux disease)    Hyperlipidemia    Hypertension     Current Outpatient Medications  Medication Sig Dispense Refill   acetaminophen (TYLENOL) 500 MG tablet Take 1,000 mg by mouth every 6 (six) hours as needed.      atorvastatin (LIPITOR) 10 MG tablet Take 1 tablet (10 mg total) by mouth daily. 90 tablet 3   cholecalciferol (VITAMIN D3) 25 MCG (1000 UNIT) tablet Take 2,000 Units by mouth daily.     DULoxetine (CYMBALTA) 30 MG capsule Take 1 capsule (30 mg total) by mouth 3 (three) times daily. 270 capsule 1   ELIQUIS 5 MG TABS tablet Take 1 tablet (5 mg total) by mouth 2 (two) times daily. 60 tablet 4   Enalapril-hydroCHLOROthiazide 5-12.5 MG tablet TAKE ONE TABLET BY MOUTH DAILY 90 tablet 1   LORazepam (ATIVAN) 0.5 MG tablet TAKE ONE TABLET BY MOUTH TWICE A DAY AS NEEDED FOR ANXIETY 30 tablet 1   metoprolol succinate (TOPROL-XL) 50 MG 24 hr tablet Take 1 tablet (50 mg total) by mouth 2 (two) times daily. Take with or immediately following a meal. 180 tablet 1   molnupiravir EUA 200 mg CAPS Take 4 capsules (800 mg total)  by mouth 2 (two) times daily for 5 days. 40 capsule 0   Ferrous Fumarate-Folic Acid 99991111 MG TABS Take 1 tablet by mouth daily. (Patient not taking: Reported on 01/10/2021) 30 tablet 2   No current facility-administered medications for this visit.    Allergies: Losartan and Oxycodone  Past Medical History:  Diagnosis Date   Adjustment disorder 01/25/2017   Anemia    Anxiety and depression    Back pain    Cancer ([place]) 2008   skin cancer, BCC   Constipation 11/14/2014   Deep vein blood clot of left lower extremity ([place]) 12/2019   Depression    Diabetes mellitus without complication (HCC)    Generalized anxiety disorder    GERD (gastroesophageal reflux disease)    Headache 08/24/2015   History of cardiovascular stress test    GXT 3/19: No ischemic ECG changes   History of echocardiogram    Echo 2/18:  EF 50-55, GLS -20% (normal), normal wall motion, grade 1 diastolic dysfunction, trivial MR, mild RVE, normal RV SF, trivial TR, PASP 23   Hyperlipidemia    Elevated, but never taken medication   Hypertension    Insomnia 08/24/2015   Obesity 11/14/2014   OSA (obstructive sleep apnea) 09/12/2016   Pain in joint, shoulder region 11/27/2015   Palpitations    Post-menopause 08/23/2015   Raynauds syndrome    Sleep apnea  appt with pulmonary Dr in march 2018   Vitamin B12 deficiency 08/23/2015   Vitamin D deficiency 08/23/2015    Past Surgical History:  Procedure Laterality Date   ABDOMINAL HYSTERECTOMY  2010   BREAST BIOPSY     right, 1996   ECTOPIC PREGNANCY SURGERY     RETINAL DETACHMENT SURGERY Right    SHOULDER OPEN ROTATOR CUFF REPAIR  2017   right   TUBAL LIGATION     on right   TYMPANOSTOMY TUBE PLACEMENT      Family History  Problem Relation Age of Onset   Diabetes Mother    Depression Mother    Hypertension Mother    Hyperlipidemia Mother    Liver disease Mother    Sleep apnea Mother    Obesity Mother    Heart attack Mother 72       CAD w/MI in mid 64s; s/p PCI    Diabetes Father    Hypertension Father    Cancer Maternal Grandmother        throat   Heart attack Maternal Grandmother    Cancer Paternal Grandmother        lung   Heart disease Paternal Grandmother    Hypertension Sister        as a teenager    Social History   Tobacco Use   Smoking status: Never   Smokeless tobacco: Never  Substance Use Topics   Alcohol use: No    Alcohol/week: 0.0 standard drinks    Subjective:    I connected with Julie Bishop on 01/10/21 at 11:20 AM EDT by a video enabled telemedicine application and verified that I am speaking with the correct person using two identifiers. Visit had to be converted to telephone call due to sound issues;   I discussed the limitations of evaluation and management by telemedicine and the availability of in person appointments. The patient expressed understanding and agreed to proceed.  Tested positive for COVID on 01/07/21 but symptoms started on Friday, 8/12; complaining of wheezing, cough, runny nose; denies any chest pain or shortness of breath but notes there is one localized area of pain in her upper right back; has been struggling with chronic cough issues for the past month;      Objective:  Vitals:   01/10/21 1051  Weight: 185 lb (83.9 kg)  Height: '5\' 3"'$  (1.6 m)    General: Well developed, well nourished, in no acute distress  Skin : Warm and dry.  Head: Normocephalic and atraumatic  Lungs: Respirations unlabored;  Neurologic: Alert and oriented; speech intact; face symmetrical; moves all extremities well; CNII-XII intact without focal deficit   Assessment:  1. COVID-19     Plan:  Start Monupiravir- she is on day 5 today and understands to start medication today; she has albuterol and Tessalon Perles at home and will start using these medications as well; She understands she would need to be seen in person to better evaluate the area of pain in her upper back- ? Pneumonia vs muscular source;  Based  on new office guidelines, she can be seen in person on day 6 and will get her seen here tomorrow;   No follow-ups on file.  No orders of the defined types were placed in this encounter.   Requested Prescriptions   Signed Prescriptions Disp Refills   molnupiravir EUA 200 mg CAPS 40 capsule 0    Sig: Take 4 capsules (800 mg total) by mouth 2 (two) times daily for 5  days.

## 2021-01-11 ENCOUNTER — Other Ambulatory Visit: Payer: Self-pay

## 2021-01-11 ENCOUNTER — Encounter: Payer: Self-pay | Admitting: Family Medicine

## 2021-01-11 ENCOUNTER — Ambulatory Visit: Payer: Federal, State, Local not specified - PPO | Admitting: Family Medicine

## 2021-01-11 VITALS — BP 108/68 | HR 53 | Temp 98.3°F | Ht 63.0 in | Wt 190.2 lb

## 2021-01-11 DIAGNOSIS — U071 COVID-19: Secondary | ICD-10-CM | POA: Diagnosis not present

## 2021-01-11 NOTE — Progress Notes (Signed)
Chief Complaint  Patient presents with   Cough    Back pain when coughing and breathing. Needs a chest xray. Tested positive on 01/07/21     Julie Bishop here for URI complaints.  Duration: 6 days  Associated symptoms: sinus congestion, rhinorrhea, wheezing, and coughing, loss of taste/smell Denies: sinus pain, itchy watery eyes, ear pain, ear drainage, sore throat, shortness of breath, myalgia, and fevers Treatment to date: [place] contacts: Yes- son gave it to her Covid + on 8/13.  Tripled vaccinated.  Past Medical History:  Diagnosis Date   Adjustment disorder 01/25/2017   Anemia    Anxiety and depression    Back pain    Cancer ([place]) 2008   skin cancer, BCC   Constipation 11/14/2014   Deep vein blood clot of left lower extremity ([place]) 12/2019   Depression    Diabetes mellitus without complication (HCC)    Generalized anxiety disorder    GERD (gastroesophageal reflux disease)    Headache 08/24/2015   History of cardiovascular stress test    GXT 3/19: No ischemic ECG changes   History of echocardiogram    Echo 2/18:  EF 50-55, GLS -20% (normal), normal wall motion, grade 1 diastolic dysfunction, trivial MR, mild RVE, normal RV SF, trivial TR, PASP 23   Hyperlipidemia    Elevated, but never taken medication   Hypertension    Insomnia 08/24/2015   Obesity 11/14/2014   OSA (obstructive sleep apnea) 09/12/2016   Pain in joint, shoulder region 11/27/2015   Palpitations    Post-menopause 08/23/2015   Raynauds syndrome    Sleep apnea    appt with pulmonary Dr in march 2018   Vitamin B12 deficiency 08/23/2015   Vitamin D deficiency 08/23/2015    Objective BP 108/68   Pulse (!) 53   Temp 98.3 F (36.8 C) (Oral)   Ht '5\' 3"'$  (1.6 m)   Wt 190 lb 4 oz (86.3 kg)   SpO2 99%   BMI 33.70 kg/m  General: Awake, alert, appears stated age HEENT: AT, [place], ears patent b/l and TM's neg, nares patent w/o discharge, pharynx pink and without exudates, MMM Neck: No masses  or asymmetry Heart: bradycardic, reg rhythm, no LE edema Lungs: CTAB, no accessory muscle use Psych: Age appropriate judgment and insight, normal mood and affect  COVID-19  Compliant w Eliquis, highly unlikely to be a clot. No sig wt change or audible findings suggestive of pleural effusion/pulm edema. Will hold off on CXR. Pt feeling better and OK w this. Offered cough med, she has Best boy at home. Cont molnupiravir.  Continue to push fluids, practice good hand hygiene, cover mouth when coughing. F/u prn. If starting to experience fevers, shaking, or shortness of breath, seek immediate care. Pt voiced understanding and agreement to the plan.  [place], DO 01/11/21 2:38 PM

## 2021-01-11 NOTE — Patient Instructions (Signed)
Continue to push fluids, practice good hand hygiene, and cover your mouth if you cough.  If you start having fevers, shaking or shortness of breath, seek immediate care.  If anything changes, feel free to message me.   I don't hear any fluid or wheezing in your lungs.  Continue to monitor your oxygen at home.   Let us know if you need anything.

## 2021-01-23 ENCOUNTER — Inpatient Hospital Stay: Payer: Federal, State, Local not specified - PPO | Attending: Family

## 2021-01-23 ENCOUNTER — Inpatient Hospital Stay (HOSPITAL_BASED_OUTPATIENT_CLINIC_OR_DEPARTMENT_OTHER): Payer: Federal, State, Local not specified - PPO | Admitting: Family

## 2021-01-23 ENCOUNTER — Encounter: Payer: Self-pay | Admitting: Family

## 2021-01-23 ENCOUNTER — Telehealth: Payer: Self-pay | Admitting: *Deleted

## 2021-01-23 ENCOUNTER — Other Ambulatory Visit: Payer: Self-pay

## 2021-01-23 ENCOUNTER — Other Ambulatory Visit: Payer: Self-pay | Admitting: Family

## 2021-01-23 VITALS — BP 130/89 | HR 76 | Temp 98.2°F | Resp 18 | Ht 63.0 in | Wt 188.4 lb

## 2021-01-23 DIAGNOSIS — D509 Iron deficiency anemia, unspecified: Secondary | ICD-10-CM | POA: Diagnosis not present

## 2021-01-23 DIAGNOSIS — Z79899 Other long term (current) drug therapy: Secondary | ICD-10-CM | POA: Diagnosis not present

## 2021-01-23 DIAGNOSIS — I82402 Acute embolism and thrombosis of unspecified deep veins of left lower extremity: Secondary | ICD-10-CM | POA: Diagnosis not present

## 2021-01-23 DIAGNOSIS — I82442 Acute embolism and thrombosis of left tibial vein: Secondary | ICD-10-CM

## 2021-01-23 DIAGNOSIS — Z7901 Long term (current) use of anticoagulants: Secondary | ICD-10-CM | POA: Insufficient documentation

## 2021-01-23 DIAGNOSIS — Z885 Allergy status to narcotic agent status: Secondary | ICD-10-CM | POA: Insufficient documentation

## 2021-01-23 LAB — CBC WITH DIFFERENTIAL (CANCER CENTER ONLY)
Abs Immature Granulocytes: 0.01 10*3/uL (ref 0.00–0.07)
Basophils Absolute: 0 10*3/uL (ref 0.0–0.1)
Basophils Relative: 0 %
Eosinophils Absolute: 0.1 10*3/uL (ref 0.0–0.5)
Eosinophils Relative: 2 %
HCT: 36.5 % (ref 36.0–46.0)
Hemoglobin: 12.2 g/dL (ref 12.0–15.0)
Immature Granulocytes: 0 %
Lymphocytes Relative: 36 %
Lymphs Abs: 1.6 10*3/uL (ref 0.7–4.0)
MCH: 28.3 pg (ref 26.0–34.0)
MCHC: 33.4 g/dL (ref 30.0–36.0)
MCV: 84.7 fL (ref 80.0–100.0)
Monocytes Absolute: 0.3 10*3/uL (ref 0.1–1.0)
Monocytes Relative: 6 %
Neutro Abs: 2.5 10*3/uL (ref 1.7–7.7)
Neutrophils Relative %: 56 %
Platelet Count: 249 10*3/uL (ref 150–400)
RBC: 4.31 MIL/uL (ref 3.87–5.11)
RDW: 13.7 % (ref 11.5–15.5)
WBC Count: 4.5 10*3/uL (ref 4.0–10.5)
nRBC: 0 % (ref 0.0–0.2)

## 2021-01-23 LAB — CMP (CANCER CENTER ONLY)
ALT: 18 U/L (ref 0–44)
AST: 16 U/L (ref 15–41)
Albumin: 4.8 g/dL (ref 3.5–5.0)
Alkaline Phosphatase: 57 U/L (ref 38–126)
Anion gap: 7 (ref 5–15)
BUN: 15 mg/dL (ref 6–20)
CO2: 30 mmol/L (ref 22–32)
Calcium: 9.6 mg/dL (ref 8.9–10.3)
Chloride: 105 mmol/L (ref 98–111)
Creatinine: 0.77 mg/dL (ref 0.44–1.00)
GFR, Estimated: >60 mL/min (ref >60)
Glucose, Bld: 104 mg/dL — ABNORMAL HIGH (ref 70–99)
Potassium: 4.2 mmol/L (ref 3.5–5.1)
Sodium: 142 mmol/L (ref 135–145)
Total Bilirubin: 0.6 mg/dL (ref 0.3–1.2)
Total Protein: 7.1 g/dL (ref 6.5–8.1)

## 2021-01-23 LAB — IRON AND TIBC
Iron: 52 ug/dL (ref 41–142)
Saturation Ratios: 16 % — ABNORMAL LOW (ref 21–57)
TIBC: 321 ug/dL (ref 236–444)
UIBC: 269 ug/dL (ref 120–384)

## 2021-01-23 LAB — FERRITIN: Ferritin: 58 ng/mL (ref 11–307)

## 2021-01-23 LAB — D-DIMER, QUANTITATIVE: D-Dimer, Quant: <0.27 ug/mL-FEU (ref 0.00–0.50)

## 2021-01-23 MED ORDER — APIXABAN 2.5 MG PO TABS: 2.5000 mg | ORAL_TABLET | Freq: Two times a day (BID) | ORAL | 6 refills | Status: DC

## 2021-01-23 NOTE — Telephone Encounter (Signed)
Per 01/23/21 los - gave upcoming appointments - confirmed.

## 2021-01-23 NOTE — Progress Notes (Signed)
Hematology and Oncology Follow Up Visit  Julie Bishop [id number] 1971/01/22 50 y.o. 01/23/2021   Principle Diagnosis:  DVT of the left lower extremity   Current Therapy:        Eliquis 5 mg PO BID   Interim History:  Julie Bishop is here today for follow-up. She is doing well and has no complaints at this time.  She was diagnosed with Covid along with her husband and son earlier this month. Thankfully they have all recuperated nicely.  She is tolerating her Eliquis nicely. No episodes of bleeding, no bruising or petechiae.  No fever, chills, n/v, cough, rash, dizziness, SOB, chest pain, palpitations, abdominal pain or changes in bowel or bladder habits.  No swelling, numbness or tingling in her extremities.  She pulled her back a little playing with her niece in the floor and has noted some left hip pain with left leg sciatica off and on.  No falls or syncope.  She has maintained a good appetite and is staying well hydrated. Her weight is stable at 188 lbs.   ECOG Performance Status: 1 - Symptomatic but completely ambulatory  Medications:  Allergies as of 01/23/2021       Reactions   Losartan Other (See Comments)   Diarrhea and abodminal cramping   Oxycodone Itching        Medication List        Accurate as of January 23, 2021  8:30 AM. If you have any questions, ask your nurse or doctor.          acetaminophen 500 MG tablet Commonly known as: TYLENOL Take 1,000 mg by mouth every 6 (six) hours as needed.   atorvastatin 10 MG tablet Commonly known as: LIPITOR Take 1 tablet (10 mg total) by mouth daily.   cholecalciferol 25 MCG (1000 UNIT) tablet Commonly known as: VITAMIN D3 Take 2,000 Units by mouth daily.   DULoxetine 30 MG capsule Commonly known as: CYMBALTA Take 1 capsule (30 mg total) by mouth 3 (three) times daily.   Eliquis 5 MG Tabs tablet Generic drug: apixaban Take 1 tablet (5 mg total) by mouth 2 (two) times daily.    Enalapril-hydroCHLOROthiazide 5-12.5 MG tablet TAKE ONE TABLET BY MOUTH DAILY   Ferrous Fumarate-Folic Acid 99991111 MG Tabs Take 1 tablet by mouth daily.   LORazepam 0.5 MG tablet Commonly known as: ATIVAN TAKE ONE TABLET BY MOUTH TWICE A DAY AS NEEDED FOR ANXIETY   metoprolol succinate 50 MG 24 hr tablet Commonly known as: TOPROL-XL Take 1 tablet (50 mg total) by mouth 2 (two) times daily. Take with or immediately following a meal.        Allergies:  Allergies  Allergen Reactions   Losartan Other (See Comments)    Diarrhea and abodminal cramping   Oxycodone Itching    Past Medical History, Surgical history, Social history, and Family History were reviewed and updated.  Review of Systems: All other 10 point review of systems is negative.   Physical Exam:  vitals were not taken for this visit.   Wt Readings from Last 3 Encounters:  01/11/21 190 lb 4 oz (86.3 kg)  01/10/21 185 lb (83.9 kg)  12/28/20 189 lb 3.2 oz (85.8 kg)    Ocular: Sclerae unicteric, pupils equal, round and reactive to light Ear-nose-throat: Oropharynx clear, dentition fair Lymphatic: No cervical or supraclavicular adenopathy Lungs no rales or rhonchi, good excursion bilaterally Heart regular rate and rhythm, no murmur appreciated Abd soft, nontender, positive bowel sounds MSK no  focal spinal tenderness, no joint edema Neuro: non-focal, well-oriented, appropriate affect Breasts: Deferred   Lab Results  Component Value Date   WBC 4.5 01/23/2021   HGB 12.2 01/23/2021   HCT 36.5 01/23/2021   MCV 84.7 01/23/2021   PLT 249 01/23/2021   Lab Results  Component Value Date   FERRITIN 126 08/22/2020   IRON 63 08/22/2020   TIBC 419 08/22/2020   UIBC 356 08/22/2020   IRONPCTSAT 15 (L) 08/22/2020   Lab Results  Component Value Date   RBC 4.31 01/23/2021   No results found for: KPAFRELGTCHN, LAMBDASER, KAPLAMBRATIO No results found for: IGGSERUM, IGA, IGMSERUM No results found for:  Odetta Pink, SPEI   Chemistry      Component Value Date/Time   NA 140 09/19/2020 0724   NA 138 10/22/2017 0935   K 4.3 09/19/2020 0724   CL 105 09/19/2020 0724   CO2 26 09/19/2020 0724   BUN 16 09/19/2020 0724   BUN 15 10/22/2017 0935   CREATININE 0.67 09/19/2020 0724   CREATININE 0.72 08/22/2020 0856   CREATININE 0.63 06/03/2020 0720      Component Value Date/Time   CALCIUM 9.6 09/19/2020 0724   ALKPHOS 69 09/19/2020 0724   AST 18 09/19/2020 0724   AST 26 08/22/2020 0856   ALT 27 09/19/2020 0724   ALT 40 08/22/2020 0856   BILITOT 0.4 09/19/2020 0724   BILITOT 0.5 08/22/2020 0856       Impression and Plan: Julie Bishop is a very pleasant 50 yo caucasian female with new diagnosis of DVT within the posterior tibial vein of the left calf post eye surgery (resolved 03/22/2020). Hyper coag work up was negative.  She has completed a year of full dose anticoagulation and we will now reduce her to maintenance dosing for 1 year.  D-dimer is < 0.27.  Follow-up in 6 months.  She can contact our office with any questions or concerns.   Julie Peace, NP 8/29/20228:30 AM

## 2021-01-26 ENCOUNTER — Other Ambulatory Visit: Payer: Self-pay

## 2021-01-26 ENCOUNTER — Other Ambulatory Visit (INDEPENDENT_AMBULATORY_CARE_PROVIDER_SITE_OTHER): Payer: Federal, State, Local not specified - PPO

## 2021-01-26 DIAGNOSIS — E782 Mixed hyperlipidemia: Secondary | ICD-10-CM

## 2021-01-26 DIAGNOSIS — R739 Hyperglycemia, unspecified: Secondary | ICD-10-CM

## 2021-01-26 DIAGNOSIS — I10 Essential (primary) hypertension: Secondary | ICD-10-CM | POA: Diagnosis not present

## 2021-01-26 DIAGNOSIS — D509 Iron deficiency anemia, unspecified: Secondary | ICD-10-CM

## 2021-01-26 DIAGNOSIS — E559 Vitamin D deficiency, unspecified: Secondary | ICD-10-CM | POA: Diagnosis not present

## 2021-01-26 LAB — COMPREHENSIVE METABOLIC PANEL
ALT: 16 U/L (ref 0–35)
AST: 14 U/L (ref 0–37)
Albumin: 4.1 g/dL (ref 3.5–5.2)
Alkaline Phosphatase: 61 U/L (ref 39–117)
BUN: 19 mg/dL (ref 6–23)
CO2: 30 mEq/L (ref 19–32)
Calcium: 9.5 mg/dL (ref 8.4–10.5)
Chloride: 102 mEq/L (ref 96–112)
Creatinine, Ser: 0.62 mg/dL (ref 0.40–1.20)
GFR: 104.29 mL/min (ref >60.00)
Glucose, Bld: 89 mg/dL (ref 70–99)
Potassium: 4.1 mEq/L (ref 3.5–5.1)
Sodium: 139 mEq/L (ref 135–145)
Total Bilirubin: 0.6 mg/dL (ref 0.2–1.2)
Total Protein: 6.9 g/dL (ref 6.0–8.3)

## 2021-01-26 LAB — CBC WITH DIFFERENTIAL/PLATELET
Basophils Absolute: 0 10*3/uL (ref 0.0–0.1)
Basophils Relative: 0.4 % (ref 0.0–3.0)
Eosinophils Absolute: 0.1 10*3/uL (ref 0.0–0.7)
Eosinophils Relative: 1.5 % (ref 0.0–5.0)
HCT: 37.4 % (ref 36.0–46.0)
Hemoglobin: 12.6 g/dL (ref 12.0–15.0)
Lymphocytes Relative: 33 % (ref 12.0–46.0)
Lymphs Abs: 1.7 10*3/uL (ref 0.7–4.0)
MCHC: 33.6 g/dL (ref 30.0–36.0)
MCV: 83.7 fl (ref 78.0–100.0)
Monocytes Absolute: 0.3 10*3/uL (ref 0.1–1.0)
Monocytes Relative: 5.9 % (ref 3.0–12.0)
Neutro Abs: 3.1 10*3/uL (ref 1.4–7.7)
Neutrophils Relative %: 59.2 % (ref 43.0–77.0)
Platelets: 275 10*3/uL (ref 150.0–400.0)
RBC: 4.47 Mil/uL (ref 3.87–5.11)
RDW: 14.8 % (ref 11.5–15.5)
WBC: 5.3 10*3/uL (ref 4.0–10.5)

## 2021-01-26 LAB — TSH: TSH: 2.5 u[IU]/mL (ref 0.35–5.50)

## 2021-01-26 LAB — LIPID PANEL
Cholesterol: 172 mg/dL (ref 0–200)
HDL: 61.2 mg/dL (ref >39.00)
LDL Cholesterol: 93 mg/dL (ref 0–99)
NonHDL: 110.42
Total CHOL/HDL Ratio: 3
Triglycerides: 85 mg/dL (ref 0.0–149.0)
VLDL: 17 mg/dL (ref 0.0–40.0)

## 2021-01-26 LAB — HEMOGLOBIN A1C: Hgb A1c MFr Bld: 5.3 % (ref 4.6–6.5)

## 2021-01-27 DIAGNOSIS — H59811 Chorioretinal scars after surgery for detachment, right eye: Secondary | ICD-10-CM | POA: Diagnosis not present

## 2021-01-27 DIAGNOSIS — H35371 Puckering of macula, right eye: Secondary | ICD-10-CM | POA: Diagnosis not present

## 2021-01-27 DIAGNOSIS — H43813 Vitreous degeneration, bilateral: Secondary | ICD-10-CM | POA: Diagnosis not present

## 2021-01-27 DIAGNOSIS — H43393 Other vitreous opacities, bilateral: Secondary | ICD-10-CM | POA: Diagnosis not present

## 2021-01-27 LAB — HM DIABETES EYE EXAM

## 2021-01-31 LAB — VITAMIN D 1,25 DIHYDROXY
Vitamin D 1, 25 (OH)2 Total: 33 pg/mL (ref 18–72)
Vitamin D2 1, 25 (OH)2: <8 pg/mL
Vitamin D3 1, 25 (OH)2: 33 pg/mL

## 2021-02-02 ENCOUNTER — Telehealth (INDEPENDENT_AMBULATORY_CARE_PROVIDER_SITE_OTHER): Payer: Federal, State, Local not specified - PPO | Admitting: Family Medicine

## 2021-02-02 ENCOUNTER — Other Ambulatory Visit: Payer: Self-pay

## 2021-02-02 DIAGNOSIS — R739 Hyperglycemia, unspecified: Secondary | ICD-10-CM

## 2021-02-02 DIAGNOSIS — I10 Essential (primary) hypertension: Secondary | ICD-10-CM

## 2021-02-02 DIAGNOSIS — I824Z9 Acute embolism and thrombosis of unspecified deep veins of unspecified distal lower extremity: Secondary | ICD-10-CM

## 2021-02-02 DIAGNOSIS — D509 Iron deficiency anemia, unspecified: Secondary | ICD-10-CM | POA: Diagnosis not present

## 2021-02-02 DIAGNOSIS — E782 Mixed hyperlipidemia: Secondary | ICD-10-CM | POA: Diagnosis not present

## 2021-02-02 DIAGNOSIS — E559 Vitamin D deficiency, unspecified: Secondary | ICD-10-CM

## 2021-02-02 DIAGNOSIS — U071 COVID-19: Secondary | ICD-10-CM

## 2021-02-02 DIAGNOSIS — E538 Deficiency of other specified B group vitamins: Secondary | ICD-10-CM | POA: Diagnosis not present

## 2021-02-02 NOTE — Assessment & Plan Note (Signed)
Supplement and monitor 

## 2021-02-02 NOTE — Progress Notes (Signed)
MyChart Video Visit    Virtual Visit via Video Note   This visit type was conducted due to national recommendations for restrictions regarding the COVID-19 Pandemic (e.g. social distancing) in an effort to limit this patient's exposure and mitigate transmission in our community. This patient is at least at moderate risk for complications without adequate follow up. This format is felt to be most appropriate for this patient at this time. Physical exam was limited by quality of the video and audio technology used for the visit. S. Chism, CMA was able to get the patient set up on a video visit.  Patient location: Home Patient and provider in visit Provider location: Office  I discussed the limitations of evaluation and management by telemedicine and the availability of in person appointments. The patient expressed understanding and agreed to proceed.  Visit Date: 02/05/21  Today's healthcare provider: Penni Homans, MD     Subjective:    Patient ID: Julie Bishop, female    DOB: 11-20-1970, 50 y.o.   MRN: [id number]  Chief Complaint  Patient presents with   Follow-up    HPI Patient is in today for virtual visit for follow up on recent covid infection and medication management. She states that she got covid when her son went out of town and came back in sick and that is when she got sick as well. She experienced runny nose, coughs, fatigue, and back pain. However, at the moment all these symptoms have subsided. Denies CP/palp/SOB/HA/congestion/fevers/GI or GU c/o. Taking meds as prescribed. She is planning on stopping Eliquis 2.5 mg BID once she runs out.   Past Medical History:  Diagnosis Date   Adjustment disorder 01/25/2017   Anemia    Anxiety and depression    Back pain    Cancer ([place]) 2008   skin cancer, BCC   Constipation 11/14/2014   Deep vein blood clot of left lower extremity ([place]) 12/2019   Depression    Diabetes mellitus without complication (HCC)     Generalized anxiety disorder    GERD (gastroesophageal reflux disease)    Headache 08/24/2015   History of cardiovascular stress test    GXT 3/19: No ischemic ECG changes   History of echocardiogram    Echo 2/18:  EF 50-55, GLS -20% (normal), normal wall motion, grade 1 diastolic dysfunction, trivial MR, mild RVE, normal RV SF, trivial TR, PASP 23   Hyperlipidemia    Elevated, but never taken medication   Hypertension    Insomnia 08/24/2015   Obesity 11/14/2014   OSA (obstructive sleep apnea) 09/12/2016   Pain in joint, shoulder region 11/27/2015   Palpitations    Post-menopause 08/23/2015   Raynauds syndrome    Sleep apnea    appt with pulmonary Dr in march 2018   Vitamin B12 deficiency 08/23/2015   Vitamin D deficiency 08/23/2015    Past Surgical History:  Procedure Laterality Date   ABDOMINAL HYSTERECTOMY  2010   BREAST BIOPSY     right, 1996   ECTOPIC PREGNANCY SURGERY     RETINAL DETACHMENT SURGERY Right    SHOULDER OPEN ROTATOR CUFF REPAIR  2017   right   TUBAL LIGATION     on right   TYMPANOSTOMY TUBE PLACEMENT      Family History  Problem Relation Age of Onset   Diabetes Mother    Depression Mother    Hypertension Mother    Hyperlipidemia Mother    Liver disease Mother    Sleep apnea Mother  Obesity Mother    Heart attack Mother 26       CAD w/MI in mid 36s; s/p PCI   Diabetes Father    Hypertension Father    Cancer Maternal Grandmother        throat   Heart attack Maternal Grandmother    Cancer Paternal Grandmother        lung   Heart disease Paternal Grandmother    Hypertension Sister        as a teenager    Social History   Socioeconomic History   Marital status: Married    Spouse name: Aadhira Sherouse   Number of children: 1   Years of education: 12   Highest education level: Not on file  Occupational History   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[employer]  Tobacco Use   Smoking status: Never   Smokeless tobacco: Never  Vaping Use    Vaping Use: Never used  Substance and Sexual Activity   Alcohol use: No    Alcohol/week: 0.0 standard drinks   Drug use: No   Sexual activity: Yes    Partners: Male    Birth control/protection: Post-menopausal    Comment: 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[place], Charity fundraiser., lives with husband  Other Topics Concern   Not on file  Social History Narrative   Not on file   Social Determinants of Health   Financial Resource Strain: Not on file  Food Insecurity: No Food Insecurity   Worried About Charity fundraiser in the Last Year: Never true   Arboriculturist in the Last Year: Never true  Transportation Needs: Not on file  Physical Activity: Not on file  Stress: Not on file  Social Connections: Not on file  Intimate Partner Violence: Not on file    Outpatient Medications Prior to Visit  Medication Sig Dispense Refill   acetaminophen (TYLENOL) 500 MG tablet Take 1,000 mg by mouth every 6 (six) hours as needed.      apixaban (ELIQUIS) 2.5 MG TABS tablet Take 1 tablet (2.5 mg total) by mouth 2 (two) times daily. 60 tablet 6   atorvastatin (LIPITOR) 10 MG tablet Take 1 tablet (10 mg total) by mouth daily. 90 tablet 3   cholecalciferol (VITAMIN D3) 25 MCG (1000 UNIT) tablet Take 2,000 Units by mouth daily.     DULoxetine (CYMBALTA) 30 MG capsule Take 1 capsule (30 mg total) by mouth 3 (three) times daily. 270 capsule 1   Enalapril-hydroCHLOROthiazide 5-12.5 MG tablet TAKE ONE TABLET BY MOUTH DAILY 90 tablet 1   Ferrous Fumarate-Folic Acid 99991111 MG TABS Take 1 tablet by mouth daily. 30 tablet 2   LORazepam (ATIVAN) 0.5 MG tablet TAKE ONE TABLET BY MOUTH TWICE A DAY AS NEEDED FOR ANXIETY 30 tablet 1   metoprolol succinate (TOPROL-XL) 50 MG 24 hr tablet Take 1 tablet (50 mg total) by mouth 2 (two) times daily. Take with or immediately following a meal. 180 tablet 1   No facility-administered medications prior to visit.    Allergies  Allergen Reactions   Losartan Other (See  Comments)    Diarrhea and abodminal cramping   Oxycodone Itching    Review of Systems  Constitutional:  Negative for chills, fever and malaise/fatigue.  HENT:  Negative for congestion, sinus pain and sore throat.   Eyes:  Negative for blurred vision.  Respiratory:  Negative for cough and shortness of breath.   Cardiovascular:  Negative for chest pain, palpitations and leg swelling.  Gastrointestinal:  Negative for blood in stool, diarrhea, nausea and vomiting.  Genitourinary:  Negative for flank pain and frequency.  Musculoskeletal:  Negative for back pain.  Skin:  Negative for rash.  Neurological:  Negative for headaches.      Objective:    Physical Exam Constitutional:      Appearance: Normal appearance.  HENT:     Head: Normocephalic and atraumatic.     Right Ear: External ear normal.     Left Ear: External ear normal.  Pulmonary:     Effort: Pulmonary effort is normal.  Musculoskeletal:        General: Normal range of motion.     Cervical back: Normal range of motion.  Skin:    General: Skin is dry.  Neurological:     Mental Status: She is alert and oriented to person, place, and time.  Psychiatric:        Behavior: Behavior normal.    There were no vitals taken for this visit. Wt Readings from Last 3 Encounters:  01/23/21 188 lb 6.4 oz (85.5 kg)  01/11/21 190 lb 4 oz (86.3 kg)  01/10/21 185 lb (83.9 kg)    Diabetic Foot Exam - Simple   No data filed    Lab Results  Component Value Date   WBC 5.3 01/26/2021   HGB 12.6 01/26/2021   HCT 37.4 01/26/2021   PLT 275.0 01/26/2021   GLUCOSE 89 01/26/2021   CHOL 172 01/26/2021   TRIG 85.0 01/26/2021   HDL 61.20 01/26/2021   LDLCALC 93 01/26/2021   ALT 16 01/26/2021   AST 14 01/26/2021   NA 139 01/26/2021   K 4.1 01/26/2021   CL 102 01/26/2021   CREATININE 0.62 01/26/2021   BUN 19 01/26/2021   CO2 30 01/26/2021   TSH 2.50 01/26/2021   HGBA1C 5.3 01/26/2021    Lab Results  Component Value Date    TSH 2.50 01/26/2021   Lab Results  Component Value Date   WBC 5.3 01/26/2021   HGB 12.6 01/26/2021   HCT 37.4 01/26/2021   MCV 83.7 01/26/2021   PLT 275.0 01/26/2021   Lab Results  Component Value Date   NA 139 01/26/2021   K 4.1 01/26/2021   CO2 30 01/26/2021   GLUCOSE 89 01/26/2021   BUN 19 01/26/2021   CREATININE 0.62 01/26/2021   BILITOT 0.6 01/26/2021   ALKPHOS 61 01/26/2021   AST 14 01/26/2021   ALT 16 01/26/2021   PROT 6.9 01/26/2021   ALBUMIN 4.1 01/26/2021   CALCIUM 9.5 01/26/2021   ANIONGAP 7 01/23/2021   GFR 104.29 01/26/2021   Lab Results  Component Value Date   CHOL 172 01/26/2021   Lab Results  Component Value Date   HDL 61.20 01/26/2021   Lab Results  Component Value Date   LDLCALC 93 01/26/2021   Lab Results  Component Value Date   TRIG 85.0 01/26/2021   Lab Results  Component Value Date   CHOLHDL 3 01/26/2021   Lab Results  Component Value Date   HGBA1C 5.3 01/26/2021       Assessment & Plan:   Problem List Items Addressed This Visit     Hyperlipidemia    Encourage heart healthy diet such as MIND or DASH diet, increase exercise, avoid trans fats, simple carbohydrates and processed foods, consider a krill or fish or flaxseed oil cap daily. Tolerating statin      Hypertension    Monitor and report any concerns, no changes to  meds. Encouraged heart healthy diet such as the DASH diet and exercise as tolerated.      Vitamin D deficiency    Supplement and monitor.       Vitamin B12 deficiency    Supplement and monitor      Hyperglycemia    hgba1c acceptable, minimize simple carbs. Increase exercise as tolerated. Continue current meds      IDA (iron deficiency anemia)    Improved just continue MV with minerals      DVT (deep venous thrombosis) ([place])    Is following with hematology and her Eliquis has just been decreased to 2.5 mg bid for next year.       COVID-19    She felt lousy with myalgias, back pain, cough,  rhinorrhea and fatigue. Her fatigue is the most persistent symptom but it is improving.          No orders of the defined types were placed in this encounter.   I discussed the assessment and treatment plan with the patient. The patient was provided an opportunity to ask questions and all were answered. The patient agreed with the plan and demonstrated an understanding of the instructions.   The patient was advised to call back or seek an in-person evaluation if the symptoms worsen or if the condition fails to improve as anticipated.  I provided 25 minutes of face-to-face time during this encounter.   Penni Homans, MD [facility] at [facility] [phone] (phone) [phone] (fax)  Hawley, Suezanne Jacquet, acting as a scribe for Penni Homans, MD, have documented all relevent documentation on behalf of Penni Homans, MD, as directed by Penni Homans, MD while in the presence of Penni Homans, MD. DO:02/05/21.  I, [name], MD personally performed the services described in this documentation. All medical record entries made by the scribe were at my direction and in my presence. I have reviewed the chart and agree that the record reflects my personal performance and is accurate and complete

## 2021-02-02 NOTE — Assessment & Plan Note (Signed)
Improved just continue MV with minerals

## 2021-02-02 NOTE — Assessment & Plan Note (Signed)
hgba1c acceptable, minimize simple carbs. Increase exercise as tolerated. Continue current meds 

## 2021-02-02 NOTE — Assessment & Plan Note (Signed)
Encourage heart healthy diet such as MIND or DASH diet, increase exercise, avoid trans fats, simple carbohydrates and processed foods, consider a krill or fish or flaxseed oil cap daily. Tolerating statin 

## 2021-02-05 DIAGNOSIS — U071 COVID-19: Secondary | ICD-10-CM | POA: Insufficient documentation

## 2021-02-05 DIAGNOSIS — I82409 Acute embolism and thrombosis of unspecified deep veins of unspecified lower extremity: Secondary | ICD-10-CM | POA: Insufficient documentation

## 2021-02-05 NOTE — Assessment & Plan Note (Signed)
She felt lousy with myalgias, back pain, cough, rhinorrhea and fatigue. Her fatigue is the most persistent symptom but it is improving.

## 2021-02-05 NOTE — Assessment & Plan Note (Signed)
Monitor and report any concerns, no changes to meds. Encouraged heart healthy diet such as the DASH diet and exercise as tolerated.  ?

## 2021-02-05 NOTE — Assessment & Plan Note (Signed)
Is following with hematology and her Eliquis has just been decreased to 2.5 mg bid for next year.

## 2021-02-12 ENCOUNTER — Encounter (HOSPITAL_BASED_OUTPATIENT_CLINIC_OR_DEPARTMENT_OTHER): Payer: Self-pay | Admitting: Emergency Medicine

## 2021-02-12 ENCOUNTER — Encounter: Payer: Self-pay | Admitting: Family Medicine

## 2021-02-12 ENCOUNTER — Other Ambulatory Visit: Payer: Self-pay

## 2021-02-12 ENCOUNTER — Emergency Department (HOSPITAL_BASED_OUTPATIENT_CLINIC_OR_DEPARTMENT_OTHER)
Admission: EM | Admit: 2021-02-12 | Discharge: 2021-02-12 | Disposition: A | Discharge status: Home / Self Care | Payer: Federal, State, Local not specified - PPO | Attending: Emergency Medicine | Admitting: Emergency Medicine

## 2021-02-12 DIAGNOSIS — M79605 Pain in left leg: Secondary | ICD-10-CM | POA: Insufficient documentation

## 2021-02-12 DIAGNOSIS — M25552 Pain in left hip: Secondary | ICD-10-CM | POA: Diagnosis not present

## 2021-02-12 DIAGNOSIS — Z85828 Personal history of other malignant neoplasm of skin: Secondary | ICD-10-CM | POA: Diagnosis not present

## 2021-02-12 DIAGNOSIS — Z7901 Long term (current) use of anticoagulants: Secondary | ICD-10-CM | POA: Diagnosis not present

## 2021-02-12 DIAGNOSIS — M545 Low back pain, unspecified: Secondary | ICD-10-CM | POA: Insufficient documentation

## 2021-02-12 DIAGNOSIS — E119 Type 2 diabetes mellitus without complications: Secondary | ICD-10-CM | POA: Insufficient documentation

## 2021-02-12 DIAGNOSIS — Z8616 Personal history of COVID-19: Secondary | ICD-10-CM | POA: Diagnosis not present

## 2021-02-12 DIAGNOSIS — Z79899 Other long term (current) drug therapy: Secondary | ICD-10-CM | POA: Insufficient documentation

## 2021-02-12 DIAGNOSIS — I10 Essential (primary) hypertension: Secondary | ICD-10-CM | POA: Diagnosis not present

## 2021-02-12 MED ORDER — LIDOCAINE 5 % EX PTCH: 1.0000 | MEDICATED_PATCH | CUTANEOUS | 0 refills | Status: DC

## 2021-02-12 MED ORDER — METHOCARBAMOL 500 MG PO TABS: 500.0000 mg | ORAL_TABLET | Freq: Two times a day (BID) | ORAL | 0 refills | Status: DC

## 2021-02-12 NOTE — ED Triage Notes (Signed)
Pt c/o left leg pain that has progressively worsened over the past 1 - 2 weeks. Hx of DVT in same leg. Pt on blood thinners. Denies injury.

## 2021-02-12 NOTE — ED Provider Notes (Signed)
[facility] Provider Note   CSN: [id number] Arrival date & time: 02/12/21  2046     History Chief Complaint  Patient presents with   Leg Pain    Julie Bishop is a 49 y.o. female.  Patient has a past medical history of hyperlipidemia, hypertension, obesity, well-controlled diabetes, DVT on Eliquis.  Patient presents with left lower back and hip pain radiates down her legs.  She has some upper leg pain as well.  She says that the pain started about 2 weeks ago.  Is progressively worsened since then.  Pain is constant and is worse in certain positions.  He has been taking Tylenol with no relief.  She does not member having a recent injury.  She does not have any leg swelling or shortness of breath.  She has no numbness or tingling in her feet.  Denies any saddle anesthesia, urinary retention, bowel or bladder incontinence that is different from baseline, fever or chills.   Leg Pain Associated symptoms: no back pain and no fever       Past Medical History:  Diagnosis Date   Adjustment disorder 01/25/2017   Anemia    Anxiety and depression    Back pain    Cancer ([place]) 2008   skin cancer, BCC   Constipation 11/14/2014   Deep vein blood clot of left lower extremity ([place]) 12/2019   Depression    Diabetes mellitus without complication (HCC)    Generalized anxiety disorder    GERD (gastroesophageal reflux disease)    Headache 08/24/2015   History of cardiovascular stress test    GXT 3/19: No ischemic ECG changes   History of echocardiogram    Echo 2/18:  EF 50-55, GLS -20% (normal), normal wall motion, grade 1 diastolic dysfunction, trivial MR, mild RVE, normal RV SF, trivial TR, PASP 23   Hyperlipidemia    Elevated, but never taken medication   Hypertension    Insomnia 08/24/2015   Obesity 11/14/2014   OSA (obstructive sleep apnea) 09/12/2016   Pain in joint, shoulder region 11/27/2015   Palpitations    Post-menopause 08/23/2015   Raynauds  syndrome    Sleep apnea    appt with pulmonary Dr in march 2018   Vitamin B12 deficiency 08/23/2015   Vitamin D deficiency 08/23/2015    Patient Active Problem List   Diagnosis Date Noted   DVT (deep venous thrombosis) ([place]) 02/05/2021   COVID-19 02/05/2021   IDA (iron deficiency anemia) 03/23/2020   Urinary incontinence 07/09/2019   Palpitations 07/09/2019   Hip pain, left 07/15/2018   Otitis media of left ear 01/29/2018   Hyperglycemia 05/02/2017   Adjustment disorder 01/25/2017   OSA (obstructive sleep apnea) 09/12/2016   Chest pain 07/01/2016   Headache disorder 04/01/2016   Snoring 04/01/2016   Pain in joint, shoulder region 11/27/2015   Insomnia 08/24/2015   Vitamin D deficiency 08/23/2015   Vitamin B12 deficiency 08/23/2015   Preventative health care 08/23/2015   Post-menopause 08/23/2015   Polydipsia 12/08/2014   Constipation 11/14/2014   Obesity 11/14/2014   Anxiety and depression    Cancer ([place])    GERD (gastroesophageal reflux disease)    Hyperlipidemia    Hypertension     Past Surgical History:  Procedure Laterality Date   ABDOMINAL HYSTERECTOMY  2010   BREAST BIOPSY     right, 1996   ECTOPIC PREGNANCY SURGERY     RETINAL DETACHMENT SURGERY Right    SHOULDER OPEN ROTATOR CUFF REPAIR  2017   right   TUBAL LIGATION     on right   TYMPANOSTOMY TUBE PLACEMENT       OB History     Gravida  3   Para  1   Term  1   Preterm      AB      Living  1      SAB      IAB      Ectopic      Multiple      Live Births  1           Family History  Problem Relation Age of Onset   Diabetes Mother    Depression Mother    Hypertension Mother    Hyperlipidemia Mother    Liver disease Mother    Sleep apnea Mother    Obesity Mother    Heart attack Mother 44       CAD w/MI in mid 63s; s/p PCI   Diabetes Father    Hypertension Father    Cancer Maternal Grandmother        throat   Heart attack Maternal Grandmother    Cancer Paternal  Grandmother        lung   Heart disease Paternal Grandmother    Hypertension Sister        as a teenager    Social History   Tobacco Use   Smoking status: Never   Smokeless tobacco: Never  Vaping Use   Vaping Use: Never used  Substance Use Topics   Alcohol use: No    Alcohol/week: 0.0 standard drinks   Drug use: No    Home Medications Prior to Admission medications   Medication Sig Start Date End Date Taking? Authorizing Provider  lidocaine (LIDODERM) 5 % Place 1 patch onto the skin daily. Remove & Discard patch within 12 hours or as directed by MD 02/12/21  Yes Maryellen Dowdle, Adora Fridge, PA-C  methocarbamol (ROBAXIN) 500 MG tablet Take 1 tablet (500 mg total) by mouth 2 (two) times daily. 02/12/21  Yes Adrijana Haros, Adora Fridge, PA-C  acetaminophen (TYLENOL) 500 MG tablet Take 1,000 mg by mouth every 6 (six) hours as needed.     [provider]  apixaban (ELIQUIS) 2.5 MG TABS tablet Take 1 tablet (2.5 mg total) by mouth 2 (two) times daily. 01/23/21   Celso Amy, NP  atorvastatin (LIPITOR) 10 MG tablet Take 1 tablet (10 mg total) by mouth daily. 12/28/20 12/28/21  Richardson Dopp T, PA-C  cholecalciferol (VITAMIN D3) 25 MCG (1000 UNIT) tablet Take 2,000 Units by mouth daily.    [provider]  DULoxetine (CYMBALTA) 30 MG capsule Take 1 capsule (30 mg total) by mouth 3 (three) times daily. 10/11/20   [name], MD  Enalapril-hydroCHLOROthiazide 5-12.5 MG tablet TAKE ONE TABLET BY MOUTH DAILY 10/27/20   [name], MD  Ferrous Fumarate-Folic Acid 99991111 MG TABS Take 1 tablet by mouth daily. 05/06/20   [name], MD  LORazepam (ATIVAN) 0.5 MG tablet TAKE ONE TABLET BY MOUTH TWICE A DAY AS NEEDED FOR ANXIETY 03/16/20   [name], MD  metoprolol succinate (TOPROL-XL) 50 MG 24 hr tablet Take 1 tablet (50 mg total) by mouth 2 (two) times daily. Take with or immediately following a meal. 10/11/20   [name], MD    Allergies    Losartan and Oxycodone  Review of  Systems   Review of Systems  Constitutional:  Negative for chills  and fever.  HENT:  Negative for congestion, rhinorrhea and sore throat.   Eyes:  Negative for visual disturbance.  Respiratory:  Negative for cough, chest tightness and shortness of breath.   Cardiovascular:  Negative for chest pain, palpitations and leg swelling.  Gastrointestinal:  Negative for abdominal pain, constipation, diarrhea, nausea and vomiting.  Genitourinary:  Negative for difficulty urinating and flank pain.  Musculoskeletal:  Positive for arthralgias. Negative for back pain, gait problem and joint swelling.  Skin:  Negative for rash and wound.  Neurological:  Negative for dizziness, syncope, weakness, light-headedness and headaches.  All other systems reviewed and are negative.  Physical Exam Updated Vital Signs BP (!) 150/84   Pulse 70   Temp 98.3 F (36.8 C) (Oral)   Resp 15   Ht '5\' 3"'$  (1.6 m)   Wt 82.1 kg   SpO2 99%   BMI 32.06 kg/m   Physical Exam Vitals and nursing note reviewed.  Constitutional:      General: She is not in acute distress.    Appearance: Normal appearance. She is not ill-appearing, toxic-appearing or diaphoretic.  HENT:     Head: Normocephalic and atraumatic.  Eyes:     General: No scleral icterus.       Right eye: No discharge.        Left eye: No discharge.     Conjunctiva/sclera: Conjunctivae normal.  Cardiovascular:     Rate and Rhythm: Normal rate and regular rhythm.     Pulses: Normal pulses.     Heart sounds: Normal heart sounds, S1 normal and S2 normal. No murmur heard.   No friction rub. No gallop.  Pulmonary:     Effort: Pulmonary effort is normal. No respiratory distress.     Breath sounds: Normal breath sounds. No wheezing, rhonchi or rales.  Abdominal:     General: Abdomen is flat. Bowel sounds are normal. There is no distension.     Palpations: Abdomen is soft. There is no pulsatile mass.     Tenderness: There is no abdominal tenderness. There is no  guarding or rebound.  Musculoskeletal:     Lumbar back: No tenderness or bony tenderness. Negative right straight leg raise test and negative left straight leg raise test.     Right lower leg: No edema.     Left lower leg: No edema.     Comments: No midline tenderness of spine, no stepoff or deformity; reproducible muscular tenderness in paraspinal muscles on left side DP/PT pulses 2+ and equal bilaterally No leg edema Sensation grossly intact on anterior thighs, dorsum of foot and lateral foot Strength of knee flexion and extension is 5/5 Plantar and dorsiflexion of ankle 5/5 Achilles and patellar reflexes present and equal Gait normal, able to squat, toe walk and heel walk without difficulty   Skin:    General: Skin is warm and dry.     Coloration: Skin is not jaundiced.     Findings: No bruising, erythema, lesion or rash.  Neurological:     General: No focal deficit present.     Mental Status: She is alert and oriented to person, place, and time.  Psychiatric:        Mood and Affect: Mood normal.        Behavior: Behavior normal.    ED Results / Procedures / Treatments   Labs (all labs ordered are listed, but only abnormal results are displayed) Labs Reviewed - No data to display  EKG None  Radiology No results  found.  Procedures Procedures   Medications Ordered in ED Medications - No data to display  ED Course  I have reviewed the triage vital signs and the nursing notes.  Pertinent labs & imaging results that were available during my care of the patient were reviewed by me and considered in my medical decision making (see chart for details).    MDM Rules/Calculators/A&P                          Patient presents to the ED with left lower back and hip pain that radiates down her left leg.  She is well-appearing. her vital signs are normal.  She is afebrile.  She has a history of a DVT and is on Eliquis so has a concern for blood clot however her presentation is  more consistent with SI joint inflammation or sciatica. Patient has no swelling of her left extremity on exam and this is a different presentation she had for her previous blood clot.  She has no midline lumbar tenderness.  No red flag symptoms indicating need for imaging.  No red flags for cancer such as prior history, unexplained weight loss, age > 17 or < 17, night pain or pain at rest, present for more than 4-6 weeks, failure to improve with treatment. No Red flags for infection such as persistent fever, hx of IVDU, spinal surgery within the past year, recent bacterial infection, immunosuppression. No red flags for [place] such as saddle anesthesia, urinary retention, overflow incontinence, bilateral lower extremity weakness or numbness, progressive neurologic sensory or motor weakness.      Since we are [place] ultrasound not available after 9 PM.  Patient will return tomorrow morning to have her left lower leg extremity ultrasound for DVTs.  Also provided her a lidocaine patch and muscle relaxers for symptomatic relief.  She instructed to follow-up with her primary care doctor.  She may benefit from a steroid injection if her symptoms do not improve.  Final Clinical Impression(s) / ED Diagnoses Final diagnoses:  Left leg pain  Left hip pain    Rx / DC Orders ED Discharge Orders          Ordered    VAS [place] LOWER EXTREMITY VENOUS (DVT)        02/12/21 2253    lidocaine (LIDODERM) 5 %  Every 24 hours        02/12/21 2253    methocarbamol (ROBAXIN) 500 MG tablet  2 times daily        02/12/21 2253             Sheila Oats 02/12/21 2309    Blanchie Dessert, MD 02/14/21 1337

## 2021-02-13 ENCOUNTER — Ambulatory Visit (HOSPITAL_BASED_OUTPATIENT_CLINIC_OR_DEPARTMENT_OTHER)
Admission: RE | Admit: 2021-02-13 | Discharge: 2021-02-13 | Disposition: A | Discharge status: Home / Self Care | Payer: Federal, State, Local not specified - PPO | Source: Ambulatory Visit | Attending: Emergency Medicine | Admitting: Emergency Medicine

## 2021-02-13 ENCOUNTER — Other Ambulatory Visit (HOSPITAL_BASED_OUTPATIENT_CLINIC_OR_DEPARTMENT_OTHER): Payer: Self-pay | Admitting: Emergency Medicine

## 2021-02-13 DIAGNOSIS — M79605 Pain in left leg: Secondary | ICD-10-CM | POA: Diagnosis not present

## 2021-02-13 DIAGNOSIS — I82402 Acute embolism and thrombosis of unspecified deep veins of left lower extremity: Secondary | ICD-10-CM | POA: Diagnosis not present

## 2021-02-13 NOTE — ED Provider Notes (Signed)
Reviewed [place] results with patient. Negative for DVT. Advised patient to continue taking her Eliquis as prescribed by her previous provider due to history of DVT. No chest pain or shortness of breath. Advised patient to follow-up with PCP for further evaluation.    Karie Kirks 02/13/21 1013    Godfrey Pick, MD 02/13/21 [phone]

## 2021-02-14 DIAGNOSIS — M5416 Radiculopathy, lumbar region: Secondary | ICD-10-CM | POA: Diagnosis not present

## 2021-03-02 ENCOUNTER — Telehealth: Payer: Self-pay | Admitting: Family Medicine

## 2021-03-02 DIAGNOSIS — R300 Dysuria: Secondary | ICD-10-CM

## 2021-03-02 NOTE — Telephone Encounter (Signed)
Patient is calling because she believes she has an UTI, she would like to know if Julie Bishop could send her something for the infection. Please advice.

## 2021-03-03 ENCOUNTER — Telehealth: Payer: Self-pay | Admitting: Family Medicine

## 2021-03-03 ENCOUNTER — Other Ambulatory Visit: Payer: Self-pay

## 2021-03-03 ENCOUNTER — Other Ambulatory Visit (INDEPENDENT_AMBULATORY_CARE_PROVIDER_SITE_OTHER): Payer: Federal, State, Local not specified - PPO

## 2021-03-03 ENCOUNTER — Other Ambulatory Visit: Payer: Self-pay | Admitting: Family Medicine

## 2021-03-03 DIAGNOSIS — M542 Cervicalgia: Secondary | ICD-10-CM | POA: Diagnosis not present

## 2021-03-03 DIAGNOSIS — S39012A Strain of muscle, fascia and tendon of lower back, initial encounter: Secondary | ICD-10-CM | POA: Diagnosis not present

## 2021-03-03 DIAGNOSIS — R300 Dysuria: Secondary | ICD-10-CM

## 2021-03-03 LAB — POC URINALSYSI DIPSTICK (AUTOMATED)
Blood, UA: NEGATIVE
Glucose, UA: NEGATIVE
Ketones, UA: NEGATIVE
Leukocytes, UA: NEGATIVE
Nitrite, UA: NEGATIVE
Protein, UA: NEGATIVE
Spec Grav, UA: 1.025 (ref 1.010–1.025)
Urobilinogen, UA: 0.2 E.U./dL
pH, UA: 6 (ref 5.0–8.0)

## 2021-03-03 MED ORDER — AMOXICILLIN 500 MG PO CAPS: 500.0000 mg | ORAL_CAPSULE | Freq: Three times a day (TID) | ORAL | 0 refills | Status: AC

## 2021-03-03 NOTE — Telephone Encounter (Signed)
Patient scheduled to come in to give urine.  Urine dip and culture ordered.

## 2021-03-03 NOTE — Telephone Encounter (Signed)
Called to get scheduled but the mailbox was full

## 2021-03-04 LAB — URINE CULTURE
MICRO NUMBER:: 12475446
SPECIMEN QUALITY:: ADEQUATE

## 2021-03-09 DIAGNOSIS — M5127 Other intervertebral disc displacement, lumbosacral region: Secondary | ICD-10-CM | POA: Diagnosis not present

## 2021-03-09 DIAGNOSIS — M9902 Segmental and somatic dysfunction of thoracic region: Secondary | ICD-10-CM | POA: Diagnosis not present

## 2021-03-09 DIAGNOSIS — M62830 Muscle spasm of back: Secondary | ICD-10-CM | POA: Diagnosis not present

## 2021-03-09 DIAGNOSIS — M9901 Segmental and somatic dysfunction of cervical region: Secondary | ICD-10-CM | POA: Diagnosis not present

## 2021-03-10 DIAGNOSIS — M9901 Segmental and somatic dysfunction of cervical region: Secondary | ICD-10-CM | POA: Diagnosis not present

## 2021-03-10 DIAGNOSIS — M62830 Muscle spasm of back: Secondary | ICD-10-CM | POA: Diagnosis not present

## 2021-03-10 DIAGNOSIS — M5127 Other intervertebral disc displacement, lumbosacral region: Secondary | ICD-10-CM | POA: Diagnosis not present

## 2021-03-10 DIAGNOSIS — M9902 Segmental and somatic dysfunction of thoracic region: Secondary | ICD-10-CM | POA: Diagnosis not present

## 2021-03-14 DIAGNOSIS — M62830 Muscle spasm of back: Secondary | ICD-10-CM | POA: Diagnosis not present

## 2021-03-14 DIAGNOSIS — M5127 Other intervertebral disc displacement, lumbosacral region: Secondary | ICD-10-CM | POA: Diagnosis not present

## 2021-03-14 DIAGNOSIS — M9902 Segmental and somatic dysfunction of thoracic region: Secondary | ICD-10-CM | POA: Diagnosis not present

## 2021-03-14 DIAGNOSIS — M9901 Segmental and somatic dysfunction of cervical region: Secondary | ICD-10-CM | POA: Diagnosis not present

## 2021-03-22 NOTE — Telephone Encounter (Signed)
Error

## 2021-03-31 ENCOUNTER — Encounter: Payer: Self-pay | Admitting: Registered Nurse

## 2021-03-31 ENCOUNTER — Telehealth (INDEPENDENT_AMBULATORY_CARE_PROVIDER_SITE_OTHER): Payer: Federal, State, Local not specified - PPO | Admitting: Registered Nurse

## 2021-03-31 ENCOUNTER — Other Ambulatory Visit: Payer: Self-pay

## 2021-03-31 DIAGNOSIS — H1032 Unspecified acute conjunctivitis, left eye: Secondary | ICD-10-CM

## 2021-03-31 MED ORDER — ERYTHROMYCIN 5 MG/GM OP OINT: 1.0000 "application " | TOPICAL_OINTMENT | Freq: Two times a day (BID) | OPHTHALMIC | 0 refills | Status: DC

## 2021-03-31 NOTE — Progress Notes (Signed)
Telemedicine Encounter- SOAP NOTE Established Patient  This video encounter was conducted with the patient's (or proxy's) verbal consent via audio telecommunications: yes/no: Yes Patient was instructed to have this encounter in a suitably private space; and to only have persons present to whom they give permission to participate. In addition, patient identity was confirmed by use of name plus two identifiers (DOB and address).  I discussed the limitations, risks, security and privacy concerns of performing an evaluation and management service by telephone and the availability of in person appointments. I also discussed with the patient that there may be a patient responsible charge related to this service. The patient expressed understanding and agreed to proceed.  I spent a total of 14 minutes talking with the patient or their proxy.  Patient at home Provider in office  Participants: Kathrin Ruddy, NP and [name]  Chief Complaint  Patient presents with   Conjunctivitis    Patient states she think she has pink eye . Patient states she woke up thinks morning with a swollen eye that's has drainage. She was sick on Monday with a sore throat , nasal drainage and cough but took a covid test and was getting better     Subjective   Julie Bishop is a 50 y.o. established patient. Video visit today for conjunctivitis  HPI Onset this morning  Puffiness around eye, red eye. L only, R eye spared Thick drainage, crusting. No pain in the eye, Some discomfort and strain with looking at screen  Patient Active Problem List   Diagnosis Date Noted   DVT (deep venous thrombosis) ([place]) 02/05/2021   COVID-19 02/05/2021   IDA (iron deficiency anemia) 03/23/2020   Urinary incontinence 07/09/2019   Palpitations 07/09/2019   Hip pain, left 07/15/2018   Otitis media of left ear 01/29/2018   Hyperglycemia 05/02/2017   Adjustment disorder 01/25/2017   OSA (obstructive sleep apnea)  09/12/2016   Chest pain 07/01/2016   Headache disorder 04/01/2016   Snoring 04/01/2016   Pain in joint, shoulder region 11/27/2015   Insomnia 08/24/2015   Vitamin D deficiency 08/23/2015   Vitamin B12 deficiency 08/23/2015   Preventative health care 08/23/2015   Post-menopause 08/23/2015   Polydipsia 12/08/2014   Constipation 11/14/2014   Obesity 11/14/2014   Anxiety and depression    Cancer ([place])    GERD (gastroesophageal reflux disease)    Hyperlipidemia    Hypertension     Past Medical History:  Diagnosis Date   Adjustment disorder 01/25/2017   Anemia    Anxiety and depression    Back pain    Cancer ([place]) 2008   skin cancer, BCC   Constipation 11/14/2014   Deep vein blood clot of left lower extremity ([place]) 12/2019   Depression    Diabetes mellitus without complication (HCC)    Generalized anxiety disorder    GERD (gastroesophageal reflux disease)    Headache 08/24/2015   History of cardiovascular stress test    GXT 3/19: No ischemic ECG changes   History of echocardiogram    Echo 2/18:  EF 50-55, GLS -20% (normal), normal wall motion, grade 1 diastolic dysfunction, trivial MR, mild RVE, normal RV SF, trivial TR, PASP 23   Hyperlipidemia    Elevated, but never taken medication   Hypertension    Insomnia 08/24/2015   Obesity 11/14/2014   OSA (obstructive sleep apnea) 09/12/2016   Pain in joint, shoulder region 11/27/2015   Palpitations    Post-menopause 08/23/2015   Raynauds  syndrome    Sleep apnea    appt with pulmonary Dr in march 2018   Vitamin B12 deficiency 08/23/2015   Vitamin D deficiency 08/23/2015    Current Outpatient Medications  Medication Sig Dispense Refill   acetaminophen (TYLENOL) 500 MG tablet Take 1,000 mg by mouth every 6 (six) hours as needed.      apixaban (ELIQUIS) 2.5 MG TABS tablet Take 1 tablet (2.5 mg total) by mouth 2 (two) times daily. 60 tablet 6   atorvastatin (LIPITOR) 10 MG tablet Take 1 tablet (10 mg total) by mouth daily. 90 tablet 3    cholecalciferol (VITAMIN D3) 25 MCG (1000 UNIT) tablet Take 2,000 Units by mouth daily.     DULoxetine (CYMBALTA) 30 MG capsule Take 1 capsule (30 mg total) by mouth 3 (three) times daily. 270 capsule 1   Enalapril-hydroCHLOROthiazide 5-12.5 MG tablet TAKE ONE TABLET BY MOUTH DAILY 90 tablet 1   Ferrous Fumarate-Folic Acid 829-9 MG TABS Take 1 tablet by mouth daily. 30 tablet 2   lidocaine (LIDODERM) 5 % Place 1 patch onto the skin daily. Remove & Discard patch within 12 hours or as directed by MD 30 patch 0   LORazepam (ATIVAN) 0.5 MG tablet TAKE ONE TABLET BY MOUTH TWICE A DAY AS NEEDED FOR ANXIETY 30 tablet 1   methocarbamol (ROBAXIN) 500 MG tablet Take 1 tablet (500 mg total) by mouth 2 (two) times daily. 20 tablet 0   metoprolol succinate (TOPROL-XL) 50 MG 24 hr tablet Take 1 tablet (50 mg total) by mouth 2 (two) times daily. Take with or immediately following a meal. 180 tablet 1   No current facility-administered medications for this visit.    Allergies  Allergen Reactions   Losartan Other (See Comments)    Diarrhea and abodminal cramping   Oxycodone Itching    Social History   Socioeconomic History   Marital status: Married    Spouse name: Jeanene Mena   Number of children: 1   Years of education: 12   Highest education level: Not on file  Occupational History   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[employer]  Tobacco Use   Smoking status: Never   Smokeless tobacco: Never  Vaping Use   Vaping Use: Never used  Substance and Sexual Activity   Alcohol use: No    Alcohol/week: 0.0 standard drinks   Drug use: No   Sexual activity: Yes    Partners: Male    Birth control/protection: Post-menopausal    Comment: 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 Whitish drainage. No obvious foreign  Vitals as reported by the patient: There were no vitals filed for this visit.  There are no diagnoses linked to this encounter.  PLAN History and limited exam over video suggest bacterial conjuncitvitis Treat as above with erythromycin  ER and urgent care precautions given to pt, who voices understanding Patient encouraged to call clinic with any questions, comments, or concerns.   I  discussed the assessment and treatment plan with the patient. The patient was provided an opportunity to ask questions and all were answered. The patient agreed with the plan and demonstrated an understanding of the instructions.   The patient was advised to call back or seek an in-person evaluation if the symptoms worsen or if the condition fails to improve as anticipated.  I provided 14 minutes of face-to-face time during this encounter.  Maximiano Coss, NP

## 2021-03-31 NOTE — Patient Instructions (Signed)
° ° ° °  If you have lab work done today you will be contacted with your lab results within the next 2 weeks.  If you have not heard from us then please contact us. The fastest way to get your results is to register for My Chart. ° ° °IF you received an x-ray today, you will receive an invoice from [facility]. Please contact [facility] at [phone] with questions or concerns regarding your invoice.  ° °IF you received labwork today, you will receive an invoice from [facility]. Please contact [facility] at [phone] with questions or concerns regarding your invoice.  ° °Our billing staff will not be able to assist you with questions regarding bills from these companies. ° °You will be contacted with the lab results as soon as they are available. The fastest way to get your results is to activate your My Chart account. Instructions are located on the last page of this paperwork. If you have not heard from us regarding the results in 2 weeks, please contact this office. °  ° ° ° °

## 2021-04-01 ENCOUNTER — Other Ambulatory Visit: Payer: Self-pay

## 2021-04-01 ENCOUNTER — Encounter (HOSPITAL_BASED_OUTPATIENT_CLINIC_OR_DEPARTMENT_OTHER): Payer: Self-pay | Admitting: Emergency Medicine

## 2021-04-01 ENCOUNTER — Emergency Department (HOSPITAL_BASED_OUTPATIENT_CLINIC_OR_DEPARTMENT_OTHER)
Admission: EM | Admit: 2021-04-01 | Discharge: 2021-04-01 | Disposition: A | Discharge status: Home / Self Care | Payer: Federal, State, Local not specified - PPO | Attending: Emergency Medicine | Admitting: Emergency Medicine

## 2021-04-01 DIAGNOSIS — Z79899 Other long term (current) drug therapy: Secondary | ICD-10-CM | POA: Insufficient documentation

## 2021-04-01 DIAGNOSIS — Z7901 Long term (current) use of anticoagulants: Secondary | ICD-10-CM | POA: Insufficient documentation

## 2021-04-01 DIAGNOSIS — Z8616 Personal history of COVID-19: Secondary | ICD-10-CM | POA: Insufficient documentation

## 2021-04-01 DIAGNOSIS — E119 Type 2 diabetes mellitus without complications: Secondary | ICD-10-CM | POA: Insufficient documentation

## 2021-04-01 DIAGNOSIS — I10 Essential (primary) hypertension: Secondary | ICD-10-CM | POA: Insufficient documentation

## 2021-04-01 DIAGNOSIS — Z85828 Personal history of other malignant neoplasm of skin: Secondary | ICD-10-CM | POA: Insufficient documentation

## 2021-04-01 DIAGNOSIS — H109 Unspecified conjunctivitis: Secondary | ICD-10-CM

## 2021-04-01 DIAGNOSIS — H1033 Unspecified acute conjunctivitis, bilateral: Secondary | ICD-10-CM | POA: Diagnosis not present

## 2021-04-01 DIAGNOSIS — H5789 Other specified disorders of eye and adnexa: Secondary | ICD-10-CM | POA: Diagnosis not present

## 2021-04-01 MED ORDER — POLYMYXIN B-TRIMETHOPRIM 10000-0.1 UNIT/ML-% OP SOLN: 2.0000 [drp] | Freq: Four times a day (QID) | OPHTHALMIC | 0 refills | Status: AC

## 2021-04-01 NOTE — ED Triage Notes (Signed)
Pt arrives pov with bilateral conjunctival redness and bilateral eye swelling. Pt endorses dx of conjunctivitis, reports difficulty using abx eye ointment.

## 2021-04-01 NOTE — Discharge Instructions (Addendum)
You are seen in the emergency department today for eye pain.  As we discussed I think your symptoms are in fact due to the bacterial conjunctivitis.  I am changing your prescription from the ointment instead to an eyedrop.  I like you to place 2 drops in both eyes every 6 hours for about 7 days.  If you have significant itching, I recommend taking Benadryl or another antihistamine.  And for any pain you can take ibuprofen or Tylenol.  I recommend following up with an optometrist if your symptoms do not resolve within the next week.  Continue to monitor how you're doing and return to the ER for new or worsening symptoms such as pain or vision changes.   It has been a pleasure seeing and caring for you today and I hope you start feeling better soon!

## 2021-04-01 NOTE — ED Notes (Signed)
Pt reports not taking b/p meds today

## 2021-04-01 NOTE — ED Provider Notes (Signed)
[facility]PART[facility] Provider Note   CSN: [id number] Arrival date & time: 04/01/21  1231     History Chief Complaint  Patient presents with   Eye Problem    Julie Bishop is a 50 y.o. female who presents emergency department with bilateral conjunctival redness and swelling for about 3 days.  Her symptoms initially started in her left eye with redness, some watery discharged and mild pain.  Patient states now her eyes just feel very uncomfortable.  She had a televisit with her primary care and was prescribed erythromycin ointment, and she has been trying to take this as prescribed but is having difficulty with it.  Her symptoms have not progressed to her right eyes well.  And she has concerns as she previously had retinal surgery on this eye.  Patient also reports that she had an upper respiratory infection several days ago, but the symptoms have improved.  No fevers, chills, vision changes.   Eye Problem Associated symptoms: discharge and redness       Past Medical History:  Diagnosis Date   Adjustment disorder 01/25/2017   Anemia    Anxiety and depression    Back pain    Cancer ([place]) 2008   skin cancer, BCC   Constipation 11/14/2014   Deep vein blood clot of left lower extremity ([place]) 12/2019   Depression    Diabetes mellitus without complication (HCC)    Generalized anxiety disorder    GERD (gastroesophageal reflux disease)    Headache 08/24/2015   History of cardiovascular stress test    GXT 3/19: No ischemic ECG changes   History of echocardiogram    Echo 2/18:  EF 50-55, GLS -20% (normal), normal wall motion, grade 1 diastolic dysfunction, trivial MR, mild RVE, normal RV SF, trivial TR, PASP 23   Hyperlipidemia    Elevated, but never taken medication   Hypertension    Insomnia 08/24/2015   Obesity 11/14/2014   OSA (obstructive sleep apnea) 09/12/2016   Pain in joint, shoulder region 11/27/2015   Palpitations    Post-menopause 08/23/2015    Raynauds syndrome    Sleep apnea    appt with pulmonary Dr in march 2018   Vitamin B12 deficiency 08/23/2015   Vitamin D deficiency 08/23/2015    Patient Active Problem List   Diagnosis Date Noted   DVT (deep venous thrombosis) ([place]) 02/05/2021   COVID-19 02/05/2021   IDA (iron deficiency anemia) 03/23/2020   Urinary incontinence 07/09/2019   Palpitations 07/09/2019   Hip pain, left 07/15/2018   Otitis media of left ear 01/29/2018   Hyperglycemia 05/02/2017   Adjustment disorder 01/25/2017   OSA (obstructive sleep apnea) 09/12/2016   Chest pain 07/01/2016   Headache disorder 04/01/2016   Snoring 04/01/2016   Pain in joint, shoulder region 11/27/2015   Insomnia 08/24/2015   Vitamin D deficiency 08/23/2015   Vitamin B12 deficiency 08/23/2015   Preventative health care 08/23/2015   Post-menopause 08/23/2015   Polydipsia 12/08/2014   Constipation 11/14/2014   Obesity 11/14/2014   Anxiety and depression    Cancer ([place])    GERD (gastroesophageal reflux disease)    Hyperlipidemia    Hypertension     Past Surgical History:  Procedure Laterality Date   ABDOMINAL HYSTERECTOMY  2010   BREAST BIOPSY     right, 1996   ECTOPIC PREGNANCY SURGERY     RETINAL DETACHMENT SURGERY Right    SHOULDER OPEN ROTATOR CUFF REPAIR  2017   right   TUBAL  LIGATION     on right   TYMPANOSTOMY TUBE PLACEMENT       OB History     Gravida  3   Para  1   Term  1   Preterm      AB      Living  1      SAB      IAB      Ectopic      Multiple      Live Births  1           Family History  Problem Relation Age of Onset   Diabetes Mother    Depression Mother    Hypertension Mother    Hyperlipidemia Mother    Liver disease Mother    Sleep apnea Mother    Obesity Mother    Heart attack Mother 62       CAD w/MI in mid 69s; s/p PCI   Diabetes Father    Hypertension Father    Cancer Maternal Grandmother        throat   Heart attack Maternal Grandmother    Cancer  Paternal Grandmother        lung   Heart disease Paternal Grandmother    Hypertension Sister        as a teenager    Social History   Tobacco Use   Smoking status: Never   Smokeless tobacco: Never  Vaping Use   Vaping Use: Never used  Substance Use Topics   Alcohol use: Not Currently   Drug use: No    Home Medications Prior to Admission medications   Medication Sig Start Date End Date Taking? Authorizing Provider  trimethoprim-polymyxin b (POLYTRIM) ophthalmic solution Place 2 drops into both eyes every 6 (six) hours for 7 days. 04/01/21 04/08/21 Yes Piers Baade T, PA-C  acetaminophen (TYLENOL) 500 MG tablet Take 1,000 mg by mouth every 6 (six) hours as needed.     [provider]  apixaban (ELIQUIS) 2.5 MG TABS tablet Take 1 tablet (2.5 mg total) by mouth 2 (two) times daily. 01/23/21   Celso Amy, NP  atorvastatin (LIPITOR) 10 MG tablet Take 1 tablet (10 mg total) by mouth daily. 12/28/20 12/28/21  Richardson Dopp T, PA-C  cholecalciferol (VITAMIN D3) 25 MCG (1000 UNIT) tablet Take 2,000 Units by mouth daily.    [provider]  DULoxetine (CYMBALTA) 30 MG capsule Take 1 capsule (30 mg total) by mouth 3 (three) times daily. 10/11/20   [name], MD  Enalapril-hydroCHLOROthiazide 5-12.5 MG tablet TAKE ONE TABLET BY MOUTH DAILY 10/27/20   [name], MD  erythromycin ophthalmic ointment Place 1 application into both eyes 2 (two) times daily. 03/31/21   Maximiano Coss, NP  Ferrous Fumarate-Folic Acid 161-0 MG TABS Take 1 tablet by mouth daily. 05/06/20   [name], MD  lidocaine (LIDODERM) 5 % Place 1 patch onto the skin daily. Remove & Discard patch within 12 hours or as directed by MD 02/12/21   Loeffler, Adora Fridge, PA-C  LORazepam (ATIVAN) 0.5 MG tablet TAKE ONE TABLET BY MOUTH TWICE A DAY AS NEEDED FOR ANXIETY 03/16/20   [name], MD  methocarbamol (ROBAXIN) 500 MG tablet Take 1 tablet (500 mg total) by mouth 2 (two) times daily. 02/12/21    Loeffler, Adora Fridge, PA-C  metoprolol succinate (TOPROL-XL) 50 MG 24 hr tablet Take 1 tablet (50 mg total) by mouth 2 (two) times daily. Take with or immediately following a  meal. 10/11/20   [name], MD    Allergies    Losartan and Oxycodone  Review of Systems   Review of Systems  Constitutional:  Negative for chills and fever.  HENT:  Negative for congestion.   Eyes:  Positive for pain, discharge and redness. Negative for visual disturbance.  Respiratory:  Negative for cough.   All other systems reviewed and are negative.  Physical Exam Updated Vital Signs BP (!) 190/87 (BP Location: Right Arm)   Pulse 81   Temp 98.2 F (36.8 C) (Oral)   Resp 18   Ht 5\' 3"  (1.6 m)   Wt 83.9 kg   SpO2 100%   BMI 32.77 kg/m   Physical Exam Vitals and nursing note reviewed.  Constitutional:      Appearance: Normal appearance.  HENT:     Head: Normocephalic and atraumatic.  Eyes:     General: Lids are normal.        Right eye: Discharge present.     Conjunctiva/sclera:     Right eye: Right conjunctiva is injected.     Left eye: Left conjunctiva is injected.     Comments: Yellow discharge from the right eye.  Bilateral conjunctival injection.  PERRLA, EOMI.  Pulmonary:     Effort: Pulmonary effort is normal. No respiratory distress.  Skin:    General: Skin is warm and dry.  Neurological:     Mental Status: She is alert.  Psychiatric:        Mood and Affect: Mood normal.        Behavior: Behavior normal.    ED Results / Procedures / Treatments   Labs (all labs ordered are listed, but only abnormal results are displayed) Labs Reviewed - No data to display  EKG None  Radiology No results found.  Procedures Procedures   Medications Ordered in ED Medications - No data to display  ED Course  I have reviewed the triage vital signs and the nursing notes.  Pertinent labs & imaging results that were available during my care of the patient were reviewed by me and  considered in my medical decision making (see chart for details).    MDM Rules/Calculators/A&P                           Patient is a 50 year old female with history of hypertension who presents to the emergency department for bilateral eye redness and swelling.  Patient was recently seen via televisit and was prescribed erythromycin ointment for bacterial conjunctivitis, but has had difficulty using this.    On my exam patient is afebrile, she has bilateral conjunctival injection with yellowish discharge from the right eye.  No vision changes.  Bilateral lids without deformity.  Of note patient has been hypertensive while in the emergency department, but states that she has not taken her hypertension medication today.  She is not having any symptoms related to this.  While bilateral eye redness is more indicative of viral conjunctivitis, she states that her symptoms initially started in left eye and then progressed to the right.  I agree with the prior diagnosis of bacterial conjunctivitis, but we will change her from erythromycin ointment to polymyxin ophthalmic solution.  She is not requiring admission or inpatient treatment for symptoms at this time.  Plan to discharge to home, discussed reasons to return to the emergency department.  Patient agreeable to plan.  Final Clinical Impression(s) / ED Diagnoses Final diagnoses:  Bacterial conjunctivitis    Rx / DC Orders ED Discharge Orders          Ordered    trimethoprim-polymyxin b (POLYTRIM) ophthalmic solution  Every 6 hours        04/01/21 1805             Shalondra Wunschel T, PA-C 04/01/21 1828    Lorelle Gibbs, DO 04/02/21 2300

## 2021-04-03 DIAGNOSIS — B300 Keratoconjunctivitis due to adenovirus: Secondary | ICD-10-CM | POA: Diagnosis not present

## 2021-04-05 ENCOUNTER — Telehealth: Payer: Self-pay | Admitting: Family Medicine

## 2021-04-05 ENCOUNTER — Other Ambulatory Visit: Payer: Self-pay

## 2021-04-05 DIAGNOSIS — I10 Essential (primary) hypertension: Secondary | ICD-10-CM

## 2021-04-05 MED ORDER — ENALAPRIL-HYDROCHLOROTHIAZIDE 5-12.5 MG PO TABS: ORAL_TABLET | ORAL | 1 refills | Status: DC

## 2021-04-05 NOTE — Telephone Encounter (Signed)
Medication: Enalapril-hydroCHLOROthiazide 5-12.5 MG tablet  Has the patient contacted their pharmacy? Yes.   (If no, request that the patient contact the pharmacy for the refill.) (If yes, when and what did the pharmacy advise?)  Not on file  Preferred Pharmacy (with phone number or street name):  [facility] [id number], [place]  [place], [place] [address]  Phone:  [phone]  Fax:  [phone]  Agent: Please be advised that RX refills may take up to 3 business days. We ask that you follow-up with your pharmacy.

## 2021-04-05 NOTE — Telephone Encounter (Signed)
Medication sent in. 

## 2021-04-14 ENCOUNTER — Telehealth: Payer: Self-pay | Admitting: Family Medicine

## 2021-04-14 NOTE — Telephone Encounter (Signed)
Patient states she has been having issues with her eyes and multiple doctors  have taken a look at her and she has not gotten any better. She has gotten different diagnoses from different eye doctor and would like for Dr. Charlett Blake to give her advice on what to do. Please advice.

## 2021-04-14 NOTE — Telephone Encounter (Signed)
Patient was seen by Dr. Orland Mustard on 11/4 and then ED on 11/5.

## 2021-04-18 NOTE — Telephone Encounter (Signed)
Mailbox full and could leave a message

## 2021-04-19 NOTE — Telephone Encounter (Signed)
Mailbox full and could not leave message.

## 2021-04-26 DIAGNOSIS — Z7901 Long term (current) use of anticoagulants: Secondary | ICD-10-CM | POA: Diagnosis not present

## 2021-04-26 DIAGNOSIS — Z86718 Personal history of other venous thrombosis and embolism: Secondary | ICD-10-CM | POA: Diagnosis not present

## 2021-04-26 DIAGNOSIS — K645 Perianal venous thrombosis: Secondary | ICD-10-CM | POA: Diagnosis not present

## 2021-04-26 DIAGNOSIS — H10013 Acute follicular conjunctivitis, bilateral: Secondary | ICD-10-CM | POA: Diagnosis not present

## 2021-07-03 ENCOUNTER — Telehealth: Payer: Self-pay | Admitting: Family Medicine

## 2021-07-03 NOTE — Telephone Encounter (Signed)
Pt called to speak with [facility]. She is trying to schedule an appointment for a mammogram. On the questionnaire is asks if you are experiencing any pain near your chest to contact your primary care. She stated she has a slight pain under her armpit and wanted to know if she should say yes. Please advise.

## 2021-07-04 NOTE — Telephone Encounter (Signed)
Lvm to call back

## 2021-07-04 NOTE — Telephone Encounter (Signed)
Lvm

## 2021-07-05 NOTE — Telephone Encounter (Signed)
Patient scheduled for Friday with Lovena Le.

## 2021-07-07 ENCOUNTER — Encounter: Payer: Self-pay | Admitting: Family Medicine

## 2021-07-07 ENCOUNTER — Ambulatory Visit: Payer: Federal, State, Local not specified - PPO | Admitting: Family Medicine

## 2021-07-07 VITALS — BP 144/65 | HR 68 | Ht 63.0 in | Wt 207.6 lb

## 2021-07-07 DIAGNOSIS — R52 Pain, unspecified: Secondary | ICD-10-CM

## 2021-07-07 DIAGNOSIS — Z1231 Encounter for screening mammogram for malignant neoplasm of breast: Secondary | ICD-10-CM | POA: Diagnosis not present

## 2021-07-07 LAB — CBC WITH DIFFERENTIAL/PLATELET
Basophils Absolute: 0 10*3/uL (ref 0.0–0.1)
Basophils Relative: 0.5 % (ref 0.0–3.0)
Eosinophils Absolute: 0.1 10*3/uL (ref 0.0–0.7)
Eosinophils Relative: 2.7 % (ref 0.0–5.0)
HCT: 37.2 % (ref 36.0–46.0)
Hemoglobin: 12.4 g/dL (ref 12.0–15.0)
Lymphocytes Relative: 38.9 % (ref 12.0–46.0)
Lymphs Abs: 2.2 10*3/uL (ref 0.7–4.0)
MCHC: 33.4 g/dL (ref 30.0–36.0)
MCV: 83.7 fl (ref 78.0–100.0)
Monocytes Absolute: 0.3 10*3/uL (ref 0.1–1.0)
Monocytes Relative: 5.7 % (ref 3.0–12.0)
Neutro Abs: 2.9 10*3/uL (ref 1.4–7.7)
Neutrophils Relative %: 52.2 % (ref 43.0–77.0)
Platelets: 268 10*3/uL (ref 150.0–400.0)
RBC: 4.44 Mil/uL (ref 3.87–5.11)
RDW: 14.5 % (ref 11.5–15.5)
WBC: 5.5 10*3/uL (ref 4.0–10.5)

## 2021-07-07 NOTE — Patient Instructions (Signed)
Description sounds more musculoskeletal. No findings on manual breast exam. Recommend supportive measures. Let's proceed with screening mammogram since you are due anyway. If no findings on mammogram and symptoms persist despite supportive measures, then we can further investigate. No palpable lymph nodes today, but will get CBC to make sure no changes.   Please contact office for follow-up if symptoms do not improve or worsen. Seek emergency care if symptoms become severe.

## 2021-07-07 NOTE — Progress Notes (Signed)
Acute Office Visit  Subjective:    Patient ID: Julie Bishop, female    DOB: 07-Aug-1970, 51 y.o.   MRN: [id number]  CC: right axillary discomfort    HPI Patient is in today for right side/axillary discomfort.   Patient states she has noticed a mild discomfort on her right side, midaxillary line, slightly more posterior. She has not noticed any swelling, lumps, rashes, fevers, weight changes, breast pain, nipple changes/discharge. She has not received any recent vaccines. States she is due for routine mammogram and has never had abnormal mammogram or family history of breast cancer. The pain is more noticeable with stretching, reaching, overhead arm movements.         Past Medical History:  Diagnosis Date   Adjustment disorder 01/25/2017   Anemia    Anxiety and depression    Back pain    Cancer ([place]) 2008   skin cancer, BCC   Constipation 11/14/2014   Deep vein blood clot of left lower extremity ([place]) 12/2019   Depression    Diabetes mellitus without complication (HCC)    Generalized anxiety disorder    GERD (gastroesophageal reflux disease)    Headache 08/24/2015   History of cardiovascular stress test    GXT 3/19: No ischemic ECG changes   History of echocardiogram    Echo 2/18:  EF 50-55, GLS -20% (normal), normal wall motion, grade 1 diastolic dysfunction, trivial MR, mild RVE, normal RV SF, trivial TR, PASP 23   Hyperlipidemia    Elevated, but never taken medication   Hypertension    Insomnia 08/24/2015   Obesity 11/14/2014   OSA (obstructive sleep apnea) 09/12/2016   Pain in joint, shoulder region 11/27/2015   Palpitations    Post-menopause 08/23/2015   Raynauds syndrome    Sleep apnea    appt with pulmonary Dr in march 2018   Vitamin B12 deficiency 08/23/2015   Vitamin D deficiency 08/23/2015    Past Surgical History:  Procedure Laterality Date   ABDOMINAL HYSTERECTOMY  2010   BREAST BIOPSY     right, 1996   ECTOPIC PREGNANCY SURGERY     RETINAL  DETACHMENT SURGERY Right    SHOULDER OPEN ROTATOR CUFF REPAIR  2017   right   TUBAL LIGATION     on right   TYMPANOSTOMY TUBE PLACEMENT      Family History  Problem Relation Age of Onset   Diabetes Mother    Depression Mother    Hypertension Mother    Hyperlipidemia Mother    Liver disease Mother    Sleep apnea Mother    Obesity Mother    Heart attack Mother 32       CAD w/MI in mid 7s; s/p PCI   Diabetes Father    Hypertension Father    Cancer Maternal Grandmother        throat   Heart attack Maternal Grandmother    Cancer Paternal Grandmother        lung   Heart disease Paternal Grandmother    Hypertension Sister        as a teenager    Social History   Socioeconomic History   Marital status: Married    Spouse name: Dayla Gasca   Number of children: 1   Years of education: 12   Highest education level: Not on file  Occupational History   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[employer]  Tobacco Use   Smoking status: Never   Smokeless tobacco: Never  Vaping Use  Vaping Use: Never used  Substance and Sexual Activity   Alcohol use: Not Currently   Drug use: No   Sexual activity: Yes    Partners: Male    Birth control/protection: Post-menopausal    Comment: 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      apixaban (ELIQUIS) 2.5 MG TABS tablet Take 1 tablet (2.5 mg total) by mouth 2 (two) times daily. 60 tablet  6   atorvastatin (LIPITOR) 10 MG tablet Take 1 tablet (10 mg total) by mouth daily. 90 tablet 3   cholecalciferol (VITAMIN D3) 25 MCG (1000 UNIT) tablet Take 2,000 Units by mouth daily.     DULoxetine (CYMBALTA) 30 MG capsule Take 1 capsule (30 mg total) by mouth 3 (three) times daily. 270 capsule 1   Enalapril-hydroCHLOROthiazide 5-12.5 MG tablet TAKE ONE TABLET BY MOUTH DAILY 90 tablet 1   erythromycin ophthalmic ointment Place 1 application into both eyes 2 (two) times daily. 14 g 0   Ferrous Fumarate-Folic Acid 235-5 MG TABS Take 1 tablet by mouth daily. 30 tablet 2   lidocaine (LIDODERM) 5 % Place 1 patch onto the skin daily. Remove & Discard patch within 12 hours or as directed by MD 30 patch 0   LORazepam (ATIVAN) 0.5 MG tablet TAKE ONE TABLET BY MOUTH TWICE A DAY AS NEEDED FOR ANXIETY 30 tablet 1   methocarbamol (ROBAXIN) 500 MG tablet Take 1 tablet (500 mg total) by mouth 2 (two) times daily. 20 tablet 0   metoprolol succinate (TOPROL-XL) 50 MG 24 hr tablet Take 1 tablet (50 mg total) by mouth 2 (two) times daily. Take with or immediately following a meal. 180 tablet 1   No facility-administered medications prior to visit.    Allergies  Allergen Reactions   Losartan Other (See Comments)    Diarrhea and abodminal cramping   Oxycodone Itching    Review of Systems All review of systems negative except what is listed in the HPI     Objective:    Physical Exam Vitals reviewed.  Constitutional:      Appearance: Normal appearance. She is obese.  HENT:     Head: Normocephalic and atraumatic.  Chest:     Chest wall: No mass, lacerations, deformity, swelling, tenderness or edema.  Breasts:    Breasts are symmetrical.     Right: Normal. No swelling, bleeding, inverted nipple, mass, nipple discharge, skin change or tenderness.     Left: Normal. No swelling, bleeding, inverted nipple, mass, nipple discharge, skin change or tenderness.  Musculoskeletal:        General: No swelling  or signs of injury. Normal range of motion.     Cervical back: Normal range of motion and neck supple.     Comments: Mild discomfort with deep palpation of right midaxillary line, slightly more posterior; no palpable lymph nodes   Lymphadenopathy:     Upper Body:  Right upper body: No supraclavicular, axillary or pectoral adenopathy.     Left upper body: No supraclavicular, axillary or pectoral adenopathy.  Skin:    General: Skin is warm and dry.     Findings: No bruising, erythema, lesion or rash.  Neurological:     General: No focal deficit present.     Mental Status: She is alert and oriented to person, place, and time. Mental status is at baseline.  Psychiatric:        Mood and Affect: Mood normal.        Behavior: Behavior normal.        Thought Content: Thought content normal.        Judgment: Judgment normal.        There were no vitals taken for this visit. Wt Readings from Last 3 Encounters:  04/01/21 185 lb (83.9 kg)  02/12/21 181 lb (82.1 kg)  01/23/21 188 lb 6.4 oz (85.5 kg)    Health Maintenance Due  Topic Date Due   Zoster Vaccines- Shingrix (1 of 2) Never done   COLONOSCOPY (Pts 45-85yrs Insurance coverage will need to be confirmed)  Never done   COVID-19 Vaccine (4 - Booster for [place] series) 06/25/2020   INFLUENZA VACCINE  12/26/2020   MAMMOGRAM  01/25/2021    There are no preventive care reminders to display for this patient.   Lab Results  Component Value Date   TSH 2.50 01/26/2021   Lab Results  Component Value Date   WBC 5.3 01/26/2021   HGB 12.6 01/26/2021   HCT 37.4 01/26/2021   MCV 83.7 01/26/2021   PLT 275.0 01/26/2021   Lab Results  Component Value Date   NA 139 01/26/2021   K 4.1 01/26/2021   CO2 30 01/26/2021   GLUCOSE 89 01/26/2021   BUN 19 01/26/2021   CREATININE 0.62 01/26/2021   BILITOT 0.6 01/26/2021   ALKPHOS 61 01/26/2021   AST 14 01/26/2021   ALT 16 01/26/2021   PROT 6.9 01/26/2021   ALBUMIN 4.1 01/26/2021    CALCIUM 9.5 01/26/2021   ANIONGAP 7 01/23/2021   GFR 104.29 01/26/2021   Lab Results  Component Value Date   CHOL 172 01/26/2021   Lab Results  Component Value Date   HDL 61.20 01/26/2021   Lab Results  Component Value Date   LDLCALC 93 01/26/2021   Lab Results  Component Value Date   TRIG 85.0 01/26/2021   Lab Results  Component Value Date   CHOLHDL 3 01/26/2021   Lab Results  Component Value Date   HGBA1C 5.3 01/26/2021       Assessment & Plan:   1. Pain of right side of body Normal exam today. Consider musculoskeletal etiology and conservative measures (stretching, tylenol, ice/heat). Given location, recommend we go ahead and schedule routine screening mammogram. If no findings and symptoms persist or change/worsen, will further image to be cautious.  - CBC with Differential/Platelet  2. Encounter for screening mammogram for malignant neoplasm of breast - MM DIGITAL SCREENING BILATERAL; Future   Follow-up if symptoms worsen or fail to improve.   Terrilyn Saver, NP

## 2021-07-10 ENCOUNTER — Other Ambulatory Visit: Payer: Self-pay

## 2021-07-10 ENCOUNTER — Encounter (HOSPITAL_BASED_OUTPATIENT_CLINIC_OR_DEPARTMENT_OTHER): Payer: Self-pay

## 2021-07-10 ENCOUNTER — Ambulatory Visit (HOSPITAL_BASED_OUTPATIENT_CLINIC_OR_DEPARTMENT_OTHER)
Admission: RE | Admit: 2021-07-10 | Discharge: 2021-07-10 | Disposition: A | Discharge status: Home / Self Care | Payer: Federal, State, Local not specified - PPO | Source: Ambulatory Visit | Attending: Family Medicine | Admitting: Family Medicine

## 2021-07-10 DIAGNOSIS — Z1231 Encounter for screening mammogram for malignant neoplasm of breast: Secondary | ICD-10-CM | POA: Diagnosis not present

## 2021-07-25 ENCOUNTER — Inpatient Hospital Stay: Payer: Federal, State, Local not specified - PPO | Attending: Hematology & Oncology

## 2021-07-25 ENCOUNTER — Inpatient Hospital Stay: Payer: Federal, State, Local not specified - PPO | Admitting: Family

## 2021-07-29 ENCOUNTER — Other Ambulatory Visit: Payer: Self-pay | Admitting: Family Medicine

## 2021-07-31 NOTE — Telephone Encounter (Signed)
Requesting: lorazepam 0.'5MG'$  ?Contract: 07/26/17 ?UDS: 07/26/17 ?Last Visit: 07/07/21 ?Next Visit: 08/14/21 ?Last Refill: 03/16/20, #30, 1 refill ? ?Please Advise  ?

## 2021-08-11 NOTE — Progress Notes (Deleted)
? ?Subjective:  ? ? Patient ID: Julie Bishop, female    DOB: July 19, 1970, 51 y.o.   MRN: [id number] ? ?No chief complaint on file. ? ? ?HPI ?Patient is in today for her annual physical exam. ? ?Past Medical History:  ?Diagnosis Date  ? Adjustment disorder 01/25/2017  ? Anemia   ? Anxiety and depression   ? Back pain   ? Cancer [facility]) 2008  ? skin cancer, BCC  ? Constipation 11/14/2014  ? Deep vein blood clot of left lower extremity ([place]) 12/2019  ? Depression   ? Diabetes mellitus without complication ([place])   ? Generalized anxiety disorder   ? GERD (gastroesophageal reflux disease)   ? Headache 08/24/2015  ? History of cardiovascular stress test   ? GXT 3/19: No ischemic ECG changes  ? History of echocardiogram   ? Echo 2/18:  EF 50-55, GLS -20% (normal), normal wall motion, grade 1 diastolic dysfunction, trivial MR, mild RVE, normal RV SF, trivial TR, PASP 23  ? Hyperlipidemia   ? Elevated, but never taken medication  ? Hypertension   ? Insomnia 08/24/2015  ? Obesity 11/14/2014  ? OSA (obstructive sleep apnea) 09/12/2016  ? Pain in joint, shoulder region 11/27/2015  ? Palpitations   ? Post-menopause 08/23/2015  ? Raynauds syndrome   ? Sleep apnea   ? appt with pulmonary Dr in march 2018  ? Vitamin B12 deficiency 08/23/2015  ? Vitamin D deficiency 08/23/2015  ? ? ?Past Surgical History:  ?Procedure Laterality Date  ? ABDOMINAL HYSTERECTOMY  2010  ? BREAST BIOPSY    ? right, 1996  ? ECTOPIC PREGNANCY SURGERY    ? RETINAL DETACHMENT SURGERY Right   ? SHOULDER OPEN ROTATOR CUFF REPAIR  2017  ? right  ? TUBAL LIGATION    ? on right  ? TYMPANOSTOMY TUBE PLACEMENT    ? ? ?Family History  ?Problem Relation Age of Onset  ? Diabetes Mother   ? Depression Mother   ? Hypertension Mother   ? Hyperlipidemia Mother   ? Liver disease Mother   ? Sleep apnea Mother   ? Obesity Mother   ? Heart attack Mother 29  ?     CAD w/MI in mid 4s; s/p PCI  ? Diabetes Father   ? Hypertension Father   ? Cancer Maternal Grandmother   ?     throat  ?  Heart attack Maternal Grandmother   ? Cancer Paternal Grandmother   ?     lung  ? Heart disease Paternal Grandmother   ? Hypertension Sister   ?     as a teenager  ? ? ?Social History  ? ?Socioeconomic History  ? Marital status: Married  ?  Spouse name: Reneshia Zuccaro  ? Number of children: 1  ? Years of education: 15  ? Highest education level: Not on file  ?Occupational History  ? 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 Smoking status: Never  ? Smokeless tobacco: Never  ?Vaping Use  ? Vaping Use: Never used  ?Substance and Sexual Activity  ? Alcohol use: Not Currently  ? Drug use: No  ? Sexual activity: Yes  ?  Partners: Male  ?  Birth control/protection: Post-menopausal  ?  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 Not on file  ?Social History Narrative  ? Not on file  ? ?Social Determinants of Health  ? ?Emergency planning/management officer  Strain: Not on file  ?Food Insecurity: Not on file  ?Transportation Needs: Not on file  ?Physical Activity: Not on file  ?Stress: Not on file  ?Social Connections: Not on file  ?Intimate Partner Violence: Not on file  ? ? ?Outpatient Medications Prior to Visit  ?Medication Sig Dispense Refill  ? acetaminophen (TYLENOL) 500 MG tablet Take 1,000 mg by mouth every 6 (six) hours as needed.     ? apixaban (ELIQUIS) 2.5 MG TABS tablet Take 1 tablet (2.5 mg total) by mouth 2 (two) times daily. 60 tablet 6  ? atorvastatin (LIPITOR) 10 MG tablet Take 1 tablet (10 mg total) by mouth daily. 90 tablet 3  ? cholecalciferol (VITAMIN D3) 25 MCG (1000 UNIT) tablet Take 2,000 Units by mouth daily.    ? DULoxetine (CYMBALTA) 30 MG capsule Take 1 capsule (30 mg total) by mouth 3 (three) times daily. 270 capsule 1  ? Enalapril-hydroCHLOROthiazide 5-12.5 MG tablet TAKE ONE TABLET BY MOUTH DAILY 90 tablet 1  ? Ferrous Fumarate-Folic Acid 631-4 MG TABS Take 1 tablet by mouth daily. 30 tablet 2  ? LORazepam (ATIVAN) 0.5  MG tablet TAKE ONE TABLET BY MOUTH TWICE A DAY AS NEEDED FOR ANXIETY 30 tablet 0  ? metoprolol succinate (TOPROL-XL) 50 MG 24 hr tablet Take 1 tablet (50 mg total) by mouth 2 (two) times daily. Take with or immediately following a meal. 180 tablet 1  ? ?No facility-administered medications prior to visit.  ? ? ?Allergies  ?Allergen Reactions  ? Losartan Other (See Comments)  ?  Diarrhea and abodminal cramping  ? Oxycodone Itching  ? ? ?ROS ? ?   ?Objective:  ?  ?Physical Exam ? ?There were no vitals taken for this visit. ?Wt Readings from Last 3 Encounters:  ?07/07/21 207 lb 9.6 oz (94.2 kg)  ?04/01/21 185 lb (83.9 kg)  ?02/12/21 181 lb (82.1 kg)  ? ? ?Diabetic Foot Exam - Simple   ?No data filed ?  ? ?Lab Results  ?Component Value Date  ? WBC 5.5 07/07/2021  ? HGB 12.4 07/07/2021  ? HCT 37.2 07/07/2021  ? PLT 268.0 07/07/2021  ? GLUCOSE 89 01/26/2021  ? CHOL 172 01/26/2021  ? TRIG 85.0 01/26/2021  ? HDL 61.20 01/26/2021  ? [place] 93 01/26/2021  ? ALT 16 01/26/2021  ? AST 14 01/26/2021  ? NA 139 01/26/2021  ? K 4.1 01/26/2021  ? CL 102 01/26/2021  ? CREATININE 0.62 01/26/2021  ? BUN 19 01/26/2021  ? CO2 30 01/26/2021  ? TSH 2.50 01/26/2021  ? HGBA1C 5.3 01/26/2021  ? ? ?Lab Results  ?Component Value Date  ? TSH 2.50 01/26/2021  ? ?Lab Results  ?Component Value Date  ? WBC 5.5 07/07/2021  ? HGB 12.4 07/07/2021  ? HCT 37.2 07/07/2021  ? MCV 83.7 07/07/2021  ? PLT 268.0 07/07/2021  ? ?Lab Results  ?Component Value Date  ? NA 139 01/26/2021  ? K 4.1 01/26/2021  ? CO2 30 01/26/2021  ? GLUCOSE 89 01/26/2021  ? BUN 19 01/26/2021  ? CREATININE 0.62 01/26/2021  ? BILITOT 0.6 01/26/2021  ? ALKPHOS 61 01/26/2021  ? AST 14 01/26/2021  ? ALT 16 01/26/2021  ? PROT 6.9 01/26/2021  ? ALBUMIN 4.1 01/26/2021  ? CALCIUM 9.5 01/26/2021  ? ANIONGAP 7 01/23/2021  ? GFR 104.29 01/26/2021  ? ?Lab Results  ?Component Value Date  ? CHOL 172 01/26/2021  ? ?Lab Results  ?Component Value Date  ? HDL 61.20 01/26/2021  ? ?Lab  Results  ?Component  Value Date  ? [place] 93 01/26/2021  ? ?Lab Results  ?Component Value Date  ? TRIG 85.0 01/26/2021  ? ?Lab Results  ?Component Value Date  ? CHOLHDL 3 01/26/2021  ? ?Lab Results  ?Component Value Date  ? HGBA1C 5.3 01/26/2021  ? ? ?   ?Assessment & Plan:  ? ?Problem List Items Addressed This Visit   ? ?  ? Other  ? Preventative health care  ? ?Other Visit Diagnoses   ? ? Encounter for annual physical exam    -  Primary  ? ?  ? ? ?I am having Julie Bishop maintain her acetaminophen, cholecalciferol, Ferrous Fumarate-Folic Acid, DULoxetine, metoprolol succinate, atorvastatin, apixaban, Enalapril-hydroCHLOROthiazide, and LORazepam. ? ?No orders of the defined types were placed in this encounter. ? ? ? ?

## 2021-08-14 ENCOUNTER — Encounter: Payer: Self-pay | Admitting: Family Medicine

## 2021-08-14 ENCOUNTER — Ambulatory Visit (INDEPENDENT_AMBULATORY_CARE_PROVIDER_SITE_OTHER): Payer: Federal, State, Local not specified - PPO | Admitting: Family Medicine

## 2021-08-14 VITALS — BP 120/70 | HR 62 | Resp 20 | Ht 63.0 in | Wt 214.8 lb

## 2021-08-14 DIAGNOSIS — E1169 Type 2 diabetes mellitus with other specified complication: Secondary | ICD-10-CM | POA: Diagnosis not present

## 2021-08-14 DIAGNOSIS — E538 Deficiency of other specified B group vitamins: Secondary | ICD-10-CM

## 2021-08-14 DIAGNOSIS — Z0000 Encounter for general adult medical examination without abnormal findings: Secondary | ICD-10-CM

## 2021-08-14 DIAGNOSIS — E785 Hyperlipidemia, unspecified: Secondary | ICD-10-CM | POA: Diagnosis not present

## 2021-08-14 DIAGNOSIS — F32A Depression, unspecified: Secondary | ICD-10-CM

## 2021-08-14 DIAGNOSIS — I10 Essential (primary) hypertension: Secondary | ICD-10-CM | POA: Diagnosis not present

## 2021-08-14 DIAGNOSIS — E782 Mixed hyperlipidemia: Secondary | ICD-10-CM

## 2021-08-14 DIAGNOSIS — E559 Vitamin D deficiency, unspecified: Secondary | ICD-10-CM | POA: Diagnosis not present

## 2021-08-14 DIAGNOSIS — F419 Anxiety disorder, unspecified: Secondary | ICD-10-CM

## 2021-08-14 MED ORDER — ENALAPRIL-HYDROCHLOROTHIAZIDE 5-12.5 MG PO TABS: ORAL_TABLET | ORAL | 1 refills | Status: DC

## 2021-08-14 MED ORDER — DULOXETINE HCL 30 MG PO CPEP: 30.0000 mg | ORAL_CAPSULE | Freq: Three times a day (TID) | ORAL | 1 refills | Status: DC

## 2021-08-14 MED ORDER — LORAZEPAM 0.5 MG PO TABS: 0.5000 mg | ORAL_TABLET | Freq: Two times a day (BID) | ORAL | 1 refills | Status: DC | PRN

## 2021-08-14 MED ORDER — TRULICITY 0.75 MG/0.5ML ~~LOC~~ SOAJ: 0.7500 mg | SUBCUTANEOUS | 0 refills | Status: AC

## 2021-08-14 MED ORDER — METOPROLOL SUCCINATE ER 50 MG PO TB24: 50.0000 mg | ORAL_TABLET | Freq: Two times a day (BID) | ORAL | 1 refills | Status: DC

## 2021-08-14 NOTE — Assessment & Plan Note (Signed)
Encourage heart healthy diet such as MIND or DASH diet, increase exercise, avoid trans fats, simple carbohydrates and processed foods, consider a krill or fish or flaxseed oil cap daily.  °

## 2021-08-14 NOTE — Assessment & Plan Note (Signed)
Well controlled, no changes to meds. Encouraged heart healthy diet such as the DASH diet and exercise as tolerated.  °

## 2021-08-14 NOTE — Assessment & Plan Note (Signed)
Tried to stop Cymbalta but her father who was MIA returned and has been very dependent. She is going to start counseling and refill given on Lorazepam ?

## 2021-08-14 NOTE — Assessment & Plan Note (Signed)
Supplement and monitor 

## 2021-08-14 NOTE — Progress Notes (Signed)
? ?Subjective:  ? ?By signing my name below, I, Julie Bishop, attest that this documentation has been prepared under the direction and in the presence of Julie Lukes, MD. 08/14/2021  ?   ? ? Patient ID: Julie Bishop, female    DOB: December 18, 1970, 51 y.o.   MRN: [id number] ? ?Chief Complaint  ?Patient presents with  ? Annual Exam  ? ? ?HPI ?Patient is in today for a comprehensive physical exam. ? ?She reports the pain in her right upper side has improved. Thinks it was muscle strain. She had a mammogram and results were normal.  ? ?She reports frequent heart flutters at random times during the day. No pattern to it and it lasts for seconds. Not accompanied by dyspnea or chest pain. They occur about 1-2 times a week.  ? ?She is requesting for a refill on 0.5 mg lorazepam. Uses it as needed. She will like to start seeing a counselor because of family issues.  ? ?She will like to start weight loss medications. She had lost weight but recently gained it back when she restarted 30 mg Cymbalta.  ?Wt Readings from Last 3 Encounters:  ?08/14/21 214 lb 12.8 oz (97.4 kg)  ?07/07/21 207 lb 9.6 oz (94.2 kg)  ?04/01/21 185 lb (83.9 kg)  ? ?She stopped taking 2.5 mg eliquis. There is no family history of blood clots and she has had only one. ? ?She denies fever, congestion, eye pain, chest pain, palpitations, leg swelling, shortness of breath, nausea, abdominal pain, diarrhea and blood in stool. Also denies dysuria, frequency, back pain and headaches.  ? ?She is not interested in the shingles vaccines at this time but will think about it. She is UTD on tetanus vaccine. ? ?She is not managing a healthy diet but is trying to start. She is sleeping 6-8 hours a night, wakes up during the night but has no problem falling back asleep.  ? ?She had a colonoscopy but does not know when it was. ? ?Her father had a stent insertion 2 weeks ago. Otherwise, no other changes. ? ?Past Medical History:  ?Diagnosis Date  ? Adjustment  disorder 01/25/2017  ? Anemia   ? Anxiety and depression   ? Back pain   ? Cancer [facility]) 2008  ? skin cancer, BCC  ? Constipation 11/14/2014  ? Deep vein blood clot of left lower extremity ([place]) 12/2019  ? Depression   ? Diabetes mellitus without complication ([place])   ? Generalized anxiety disorder   ? GERD (gastroesophageal reflux disease)   ? Headache 08/24/2015  ? History of cardiovascular stress test   ? GXT 3/19: No ischemic ECG changes  ? History of echocardiogram   ? Echo 2/18:  EF 50-55, GLS -20% (normal), normal wall motion, grade 1 diastolic dysfunction, trivial MR, mild RVE, normal RV SF, trivial TR, PASP 23  ? Hyperlipidemia   ? Elevated, but never taken medication  ? Hypertension   ? Insomnia 08/24/2015  ? Obesity 11/14/2014  ? OSA (obstructive sleep apnea) 09/12/2016  ? Pain in joint, shoulder region 11/27/2015  ? Palpitations   ? Post-menopause 08/23/2015  ? Raynauds syndrome   ? Sleep apnea   ? appt with pulmonary Dr in march 2018  ? Vitamin B12 deficiency 08/23/2015  ? Vitamin D deficiency 08/23/2015  ? ? ?Past Surgical History:  ?Procedure Laterality Date  ? ABDOMINAL HYSTERECTOMY  2010  ? BREAST BIOPSY    ? right, 1996  ? ECTOPIC PREGNANCY SURGERY    ?  RETINAL DETACHMENT SURGERY Right   ? SHOULDER OPEN ROTATOR CUFF REPAIR  2017  ? right  ? TUBAL LIGATION    ? on right  ? TYMPANOSTOMY TUBE PLACEMENT    ? ? ?Family History  ?Problem Relation Age of Onset  ? Diabetes Mother   ? Depression Mother   ? Hypertension Mother   ? Hyperlipidemia Mother   ? Liver disease Mother   ? Sleep apnea Mother   ? Obesity Mother   ? Heart attack Mother 72  ?     CAD w/MI in mid 84s; s/p PCI  ? Diabetes Father   ? Hypertension Father   ? Vascular Disease Father   ?     subclavian artery obstruction s/p stent  ? Hypertension Sister   ?     as a teenager  ? Arthritis Paternal Aunt   ? Cancer Maternal Grandmother   ?     throat  ? Heart attack Maternal Grandmother   ? Cancer Paternal Grandmother   ?     lung  ? Heart disease Paternal  Grandmother   ? ? ?Social History  ? ?Socioeconomic History  ? Marital status: Married  ?  Spouse name: Julie Bishop  ? Number of children: 1  ? Years of education: 70  ? Highest education level: Not on file  ?Occupational History  ? 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 Smoking status: Never  ? Smokeless tobacco: Never  ?Vaping Use  ? Vaping Use: Never used  ?Substance and Sexual Activity  ? Alcohol use: Not Currently  ? Drug use: No  ? Sexual activity: Yes  ?  Partners: Male  ?  Birth control/protection: Post-menopausal  ?  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 Not on file  ?Social History Narrative  ? Not on file  ? ?Social Determinants of Health  ? ?Financial Resource Strain: Not on file  ?Food Insecurity: Not on file  ?Transportation Needs: Not on file  ?Physical Activity: Not on file  ?Stress: Not on file  ?Social Connections: Not on file  ?Intimate Partner Violence: Not on file  ? ? ?Outpatient Medications Prior to Visit  ?Medication Sig Dispense Refill  ? acetaminophen (TYLENOL) 500 MG tablet Take 1,000 mg by mouth every 6 (six) hours as needed.     ? atorvastatin (LIPITOR) 10 MG tablet Take 1 tablet (10 mg total) by mouth daily. 90 tablet 3  ? DULoxetine (CYMBALTA) 30 MG capsule Take 1 capsule (30 mg total) by mouth 3 (three) times daily. 270 capsule 1  ? Enalapril-hydroCHLOROthiazide 5-12.5 MG tablet TAKE ONE TABLET BY MOUTH DAILY 90 tablet 1  ? LORazepam (ATIVAN) 0.5 MG tablet TAKE ONE TABLET BY MOUTH TWICE A DAY AS NEEDED FOR ANXIETY 30 tablet 0  ? metoprolol succinate (TOPROL-XL) 50 MG 24 hr tablet Take 1 tablet (50 mg total) by mouth 2 (two) times daily. Take with or immediately following a meal. 180 tablet 1  ? apixaban (ELIQUIS) 2.5 MG TABS tablet Take 1 tablet (2.5 mg total) by mouth 2 (two) times daily. (Patient not taking: Reported on 08/14/2021) 60 tablet 6  ? cholecalciferol (VITAMIN D3) 25  MCG (1000 UNIT) tablet Take 2,000 Units by mouth daily. (Patient not taking: Reported on 08/14/2021)    ? Ferrous Fumarate-Folic Acid 132-4 MG TABS Take 1 tablet by mouth daily. (Patient not taking: Reported on 08/14/2021) 30 tablet 2  ? ?  No facility-administered medications prior to visit.  ? ? ?Allergies  ?Allergen Reactions  ? Losartan Other (See Comments)  ?  Diarrhea and abodminal cramping  ? Oxycodone Itching  ? ? ?Review of Systems  ?Constitutional:  Negative for fever.  ?HENT:  Negative for congestion.   ?Eyes:  Negative for pain.  ?Respiratory:  Negative for shortness of breath.   ?Cardiovascular:  Negative for chest pain, palpitations and leg swelling.  ?     (+) Heart flutters  ?Gastrointestinal:  Negative for abdominal pain, blood in stool, diarrhea and nausea.  ?Genitourinary:  Negative for dysuria and frequency.  ?Musculoskeletal:  Negative for back pain.  ?Neurological:  Negative for headaches.  ?Psychiatric/Behavioral:  The patient is nervous/anxious.   ? ?   ?Objective:  ?  ?Physical Exam ?Constitutional:   ?   General: She is not in acute distress. ?   Appearance: She is well-developed.  ?HENT:  ?   Head: Normocephalic and atraumatic.  ?   Right Ear: Tympanic membrane, ear canal and external ear normal.  ?   Left Ear: Tympanic membrane, ear canal and external ear normal.  ?Eyes:  ?   Extraocular Movements:  ?   Right eye: No nystagmus.  ?   Left eye: No nystagmus.  ?   Conjunctiva/sclera: Conjunctivae normal.  ?Neck:  ?   Thyroid: No thyromegaly.  ?Cardiovascular:  ?   Rate and Rhythm: Normal rate and regular rhythm.  ?   Heart sounds: Normal heart sounds. No murmur heard. ?Pulmonary:  ?   Effort: Pulmonary effort is normal. No respiratory distress.  ?   Breath sounds: Normal breath sounds.  ?Abdominal:  ?   General: Bowel sounds are normal. There is no distension.  ?   Palpations: Abdomen is soft. There is no mass.  ?   Tenderness: There is no abdominal tenderness.  ?Musculoskeletal:  ?   Cervical  back: Neck supple.  ?   Comments: 5/5 strength in upper and lower extremities   ?Lymphadenopathy:  ?   Cervical: No cervical adenopathy.  ?Skin: ?   General: Skin is warm and dry.  ?Neurological:  ?   Mental S

## 2021-08-14 NOTE — Assessment & Plan Note (Signed)
hgba1c acceptable, minimize simple carbs. Increase exercise as tolerated. Continue current meds 

## 2021-08-14 NOTE — Patient Instructions (Addendum)
Shingrix is the new shingles shot, 2 shots over 2-6 months, confirm coverage with insurance and document, then can return here for shots with nurse appt or at pharmacy  ? ?Call and get a copy of colonoscopy report ? ?Preventive Care 71-51 Years Old, Female ?Preventive care refers to lifestyle choices and visits with your health care provider that can promote health and wellness. Preventive care visits are also called wellness exams. ?What can I expect for my preventive care visit? ?Counseling ?Your health care provider may ask you questions about your: ?Medical history, including: ?Past medical problems. ?Family medical history. ?Pregnancy history. ?Current health, including: ?Menstrual cycle. ?Method of birth control. ?Emotional well-being. ?Home life and relationship well-being. ?Sexual activity and sexual health. ?Lifestyle, including: ?Alcohol, nicotine or tobacco, and drug use. ?Access to firearms. ?Diet, exercise, and sleep habits. ?Work and work Statistician. ?Sunscreen use. ?Safety issues such as seatbelt and bike helmet use. ?Physical exam ?Your health care provider will check your: ?Height and weight. These may be used to calculate your BMI (body mass index). BMI is a measurement that tells if you are at a healthy weight. ?Waist circumference. This measures the distance around your waistline. This measurement also tells if you are at a healthy weight and may help predict your risk of certain diseases, such as type 2 diabetes and high blood pressure. ?Heart rate and blood pressure. ?Body temperature. ?Skin for abnormal spots. ?What immunizations do I need? ?Vaccines are usually given at various ages, according to a schedule. Your health care provider will recommend vaccines for you based on your age, medical history, and lifestyle or other factors, such as travel or where you work. ?What tests do I need? ?Screening ?Your health care provider may recommend screening tests for certain conditions. This may  include: ?Lipid and cholesterol levels. ?Diabetes screening. This is done by checking your blood sugar (glucose) after you have not eaten for a while (fasting). ?Pelvic exam and Pap test. ?Hepatitis B test. ?Hepatitis C test. ?HIV (human immunodeficiency virus) test. ?STI (sexually transmitted infection) testing, if you are at risk. ?Lung cancer screening. ?Colorectal cancer screening. ?Mammogram. Talk with your health care provider about when you should start having regular mammograms. This may depend on whether you have a family history of breast cancer. ?BRCA-related cancer screening. This may be done if you have a family history of breast, ovarian, tubal, or peritoneal cancers. ?Bone density scan. This is done to screen for osteoporosis. ?Talk with your health care provider about your test results, treatment options, and if necessary, the need for more tests. ?Follow these instructions at home: ?Eating and drinking ? ?Eat a diet that includes fresh fruits and vegetables, whole grains, lean protein, and low-fat dairy products. ?Take vitamin and mineral supplements as recommended by your health care provider. ?Do not drink alcohol if: ?Your health care provider tells you not to drink. ?You are pregnant, may be pregnant, or are planning to become pregnant. ?If you drink alcohol: ?Limit how much you have to 0-1 drink a day. ?Know how much alcohol is in your drink. In the [place] one drink equals one 12 oz bottle of beer (355 mL), one 5 oz glass of wine (148 mL), or one 1? oz glass of hard liquor (44 mL). ?Lifestyle ?Brush your teeth every morning and night with fluoride toothpaste. Floss one time each day. ?Exercise for at least 30 minutes 5 or more days each week. ?Do not use any products that contain nicotine or tobacco. These products include cigarettes,  chewing tobacco, and vaping devices, such as e-cigarettes. If you need help quitting, ask your health care provider. ?Do not use drugs. ?If you are sexually  active, practice safe sex. Use a condom or other form of protection to prevent STIs. ?If you do not wish to become pregnant, use a form of birth control. If you plan to become pregnant, see your health care provider for a prepregnancy visit. ?Take aspirin only as told by your health care provider. Make sure that you understand how much to take and what form to take. Work with your health care provider to find out whether it is safe and beneficial for you to take aspirin daily. ?Find healthy ways to manage stress, such as: ?Meditation, yoga, or listening to music. ?Journaling. ?Talking to a trusted person. ?Spending time with friends and family. ?Minimize exposure to UV radiation to reduce your risk of skin cancer. ?Safety ?Always wear your seat belt while driving or riding in a vehicle. ?Do not drive: ?If you have been drinking alcohol. Do not ride with someone who has been drinking. ?When you are tired or distracted. ?While texting. ?If you have been using any mind-altering substances or drugs. ?Wear a helmet and other protective equipment during sports activities. ?If you have firearms in your house, make sure you follow all gun safety procedures. ?Seek help if you have been physically or sexually abused. ?What's next? ?Visit your health care provider once a year for an annual wellness visit. ?Ask your health care provider how often you should have your eyes and teeth checked. ?Stay up to date on all vaccines. ?This information is not intended to replace advice given to you by your health care provider. Make sure you discuss any questions you have with your health care provider. ?Document Revised: 11/09/2020 Document Reviewed: 11/09/2020 ?Elsevier Patient Education ? [place]. ? ?

## 2021-08-14 NOTE — Assessment & Plan Note (Addendum)
Patient encouraged to maintain heart healthy diet, regular exercise, adequate sleep. Consider daily probiotics. Take medications as prescribed. Labs ordered and reviewed. MGM 06/2021 repeat in 1 year. S/p TAH. Recommended shingrix shots and continued COVID boosters ?

## 2021-08-15 LAB — HEMOGLOBIN A1C: Hgb A1c MFr Bld: 5.7 % (ref 4.6–6.5)

## 2021-08-15 LAB — COMPREHENSIVE METABOLIC PANEL
ALT: 16 U/L (ref 0–35)
AST: 14 U/L (ref 0–37)
Albumin: 4.2 g/dL (ref 3.5–5.2)
Alkaline Phosphatase: 54 U/L (ref 39–117)
BUN: 16 mg/dL (ref 6–23)
CO2: 30 mEq/L (ref 19–32)
Calcium: 9.3 mg/dL (ref 8.4–10.5)
Chloride: 103 mEq/L (ref 96–112)
Creatinine, Ser: 0.63 mg/dL (ref 0.40–1.20)
GFR: 103.48 mL/min (ref >60.00)
Glucose, Bld: 84 mg/dL (ref 70–99)
Potassium: 3.8 mEq/L (ref 3.5–5.1)
Sodium: 139 mEq/L (ref 135–145)
Total Bilirubin: 0.3 mg/dL (ref 0.2–1.2)
Total Protein: 6.8 g/dL (ref 6.0–8.3)

## 2021-08-15 LAB — CBC WITH DIFFERENTIAL/PLATELET
Basophils Absolute: 0.1 10*3/uL (ref 0.0–0.1)
Basophils Relative: 1.1 % (ref 0.0–3.0)
Eosinophils Absolute: 0.1 10*3/uL (ref 0.0–0.7)
Eosinophils Relative: 1.9 % (ref 0.0–5.0)
HCT: 35.1 % — ABNORMAL LOW (ref 36.0–46.0)
Hemoglobin: 12 g/dL (ref 12.0–15.0)
Lymphocytes Relative: 38.2 % (ref 12.0–46.0)
Lymphs Abs: 2.5 10*3/uL (ref 0.7–4.0)
MCHC: 34.2 g/dL (ref 30.0–36.0)
MCV: 83.8 fl (ref 78.0–100.0)
Monocytes Absolute: 0.5 10*3/uL (ref 0.1–1.0)
Monocytes Relative: 6.8 % (ref 3.0–12.0)
Neutro Abs: 3.4 10*3/uL (ref 1.4–7.7)
Neutrophils Relative %: 52 % (ref 43.0–77.0)
Platelets: 258 10*3/uL (ref 150.0–400.0)
RBC: 4.19 Mil/uL (ref 3.87–5.11)
RDW: 14.3 % (ref 11.5–15.5)
WBC: 6.6 10*3/uL (ref 4.0–10.5)

## 2021-08-15 LAB — TSH: TSH: 2.65 u[IU]/mL (ref 0.35–5.50)

## 2021-08-15 LAB — LIPID PANEL
Cholesterol: 194 mg/dL (ref 0–200)
HDL: 88.3 mg/dL (ref >39.00)
LDL Cholesterol: 86 mg/dL (ref 0–99)
NonHDL: 106
Total CHOL/HDL Ratio: 2
Triglycerides: 99 mg/dL (ref 0.0–149.0)
VLDL: 19.8 mg/dL (ref 0.0–40.0)

## 2021-08-15 LAB — VITAMIN D 25 HYDROXY (VIT D DEFICIENCY, FRACTURES): VITD: 22.43 ng/mL — ABNORMAL LOW (ref 30.00–100.00)

## 2021-08-17 MED ORDER — VITAMIN D (ERGOCALCIFEROL) 1.25 MG (50000 UNIT) PO CAPS: 50000.0000 [IU] | ORAL_CAPSULE | ORAL | 4 refills | Status: DC

## 2021-08-17 NOTE — Addendum Note (Signed)
Addended by: Lynnea Ferrier R on: 08/17/2021 02:31 PM ? ? Modules accepted: Orders ? ?

## 2021-08-30 ENCOUNTER — Other Ambulatory Visit (HOSPITAL_BASED_OUTPATIENT_CLINIC_OR_DEPARTMENT_OTHER): Payer: Self-pay

## 2021-08-30 ENCOUNTER — Encounter: Payer: Self-pay | Admitting: Family Medicine

## 2021-08-30 ENCOUNTER — Ambulatory Visit: Payer: Federal, State, Local not specified - PPO | Admitting: Family Medicine

## 2021-08-30 VITALS — BP 121/75 | HR 95 | Temp 98.5°F | Resp 20 | Ht 63.0 in | Wt 216.4 lb

## 2021-08-30 DIAGNOSIS — R109 Unspecified abdominal pain: Secondary | ICD-10-CM

## 2021-08-30 DIAGNOSIS — M545 Low back pain, unspecified: Secondary | ICD-10-CM | POA: Diagnosis not present

## 2021-08-30 LAB — POC URINALSYSI DIPSTICK (AUTOMATED)
Bilirubin, UA: NEGATIVE
Blood, UA: NEGATIVE
Glucose, UA: NEGATIVE
Ketones, UA: NEGATIVE
Leukocytes, UA: NEGATIVE
Nitrite, UA: NEGATIVE
Protein, UA: POSITIVE — AB
Spec Grav, UA: 1.015 (ref 1.010–1.025)
Urobilinogen, UA: 0.2 E.U./dL
pH, UA: 7 (ref 5.0–8.0)

## 2021-08-30 MED ORDER — MELOXICAM 15 MG PO TABS
15.0000 mg | ORAL_TABLET | Freq: Every day | ORAL | 0 refills | Status: DC
Start: 2021-08-30 — End: 2022-10-16
  Filled 2021-08-30: qty 30, 30d supply, fill #0

## 2021-08-30 NOTE — Progress Notes (Signed)
? ?Acute Office Visit ? ?Subjective:  ? ? Patient ID: Julie Bishop, female    DOB: [birth year], 51 y.o.   MRN: [id number] ? ?Chief Complaint  ?Patient presents with  ? Flank Pain  ?  Started Sunday  ? ? ?HPI ?Patient is in today for right low back/flank pain.  ? ?Patient reports she started some right lower back/flank discomfort Sunday night and had trouble getting comfortable to sleep.  By Monday it felt like it was radiating around into her hip/abdomen.  During the day yesterday without mostly improved but by the evening it started bothering her again causing difficulty sleeping again.  States Tylenol does seem to take the edge off a little bit but does not last for long.  She describes the pain as a sharp/stabbing 7/10.  Her husband thought her lower back muscles may have looked slightly swollen on the right.  She denies any recent heavy lifting or injuries.  She denies any nausea, vomiting, diarrhea, dysuria, blood in urine or stool, fevers.  No specific movements make the pain worse that she has noticed. ? ?Of note she recently started Trulicity and was having some mild nausea and some constipation for the first several days. ? ?She is no longer on blood thinners (previous DVT after eye surgery); states she stopped the Eliquis about 2 months ago.  ? ? ? ?Past Medical History:  ?Diagnosis Date  ? Adjustment disorder 01/25/2017  ? Anemia   ? Anxiety and depression   ? Back pain   ? Cancer [facility]) 2008  ? skin cancer, BCC  ? Constipation 11/14/2014  ? Deep vein blood clot of left lower extremity ([place]) 12/2019  ? Depression   ? Diabetes mellitus without complication ([place])   ? Generalized anxiety disorder   ? GERD (gastroesophageal reflux disease)   ? Headache 08/24/2015  ? History of cardiovascular stress test   ? GXT 3/19: No ischemic ECG changes  ? History of echocardiogram   ? Echo 2/18:  EF 50-55, GLS -20% (normal), normal wall motion, grade 1 diastolic dysfunction, trivial MR, mild RVE, normal RV SF, trivial  TR, PASP 23  ? Hyperlipidemia   ? Elevated, but never taken medication  ? Hypertension   ? Insomnia 08/24/2015  ? Obesity 11/14/2014  ? OSA (obstructive sleep apnea) 09/12/2016  ? Pain in joint, shoulder region 11/27/2015  ? Palpitations   ? Post-menopause 08/23/2015  ? Raynauds syndrome   ? Sleep apnea   ? appt with pulmonary Dr in march 2018  ? Vitamin B12 deficiency 08/23/2015  ? Vitamin D deficiency 08/23/2015  ? ? ?Past Surgical History:  ?Procedure Laterality Date  ? ABDOMINAL HYSTERECTOMY  2010  ? BREAST BIOPSY    ? right, 1996  ? ECTOPIC PREGNANCY SURGERY    ? RETINAL DETACHMENT SURGERY Right   ? SHOULDER OPEN ROTATOR CUFF REPAIR  2017  ? right  ? TUBAL LIGATION    ? on right  ? TYMPANOSTOMY TUBE PLACEMENT    ? ? ?Family History  ?Problem Relation Age of Onset  ? Diabetes Mother   ? Depression Mother   ? Hypertension Mother   ? Hyperlipidemia Mother   ? Liver disease Mother   ? Sleep apnea Mother   ? Obesity Mother   ? Heart attack Mother 12  ?     CAD w/MI in mid 39s; s/p PCI  ? Diabetes Father   ? Hypertension Father   ? Vascular Disease Father   ?  subclavian artery obstruction s/p stent  ? Hypertension Sister   ?     as a teenager  ? Arthritis Paternal Aunt   ? Cancer Maternal Grandmother   ?     throat  ? Heart attack Maternal Grandmother   ? Cancer Paternal Grandmother   ?     lung  ? Heart disease Paternal Grandmother   ? ? ?Social History  ? ?Socioeconomic History  ? Marital status: Married  ?  Spouse name: Cooper Stamp  ? Number of children: 1  ? Years of education: 96  ? Highest education level: Not on file  ?Occupational History  ? 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[employer]  ?Tobacco Use  ? Smoking status: Never  ? Smokeless tobacco: Never  ?Vaping Use  ? Vaping Use: Never used  ?Substance and Sexual Activity  ? Alcohol use: Not Currently  ? Drug use: No  ? Sexual activity: Yes  ?  Partners: Male  ?  Birth control/protection: Post-menopausal  ?  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 Not on file  ?Social History Narrative  ? Not on file  ? ?Social Determinants of Health  ? ?Financial Resource Strain: Not on file  ?Food Insecurity: Not on file  ?Transportation Needs: Not on file  ?Physical Activity: Not on file  ?Stress: Not on file  ?Social Connections: Not on file  ?Intimate Partner Violence: Not on file  ? ? ?Outpatient Medications Prior to Visit  ?Medication Sig Dispense Refill  ? acetaminophen (TYLENOL) 500 MG tablet Take 1,000 mg by mouth every 6 (six) hours as needed.     ? atorvastatin (LIPITOR) 10 MG tablet Take 1 tablet (10 mg total) by mouth daily. 90 tablet 3  ? Dulaglutide (TRULICITY) 2.63 ZC/5.8IF SOPN Inject 0.75 mg into the skin once a week. 2 mL 0  ? DULoxetine (CYMBALTA) 30 MG capsule Take 1 capsule (30 mg total) by mouth 3 (three) times daily. 270 capsule 1  ? Enalapril-hydroCHLOROthiazide 5-12.5 MG tablet TAKE ONE TABLET BY MOUTH DAILY 90 tablet 1  ? LORazepam (ATIVAN) 0.5 MG tablet Take 1 tablet (0.5 mg total) by mouth 2 (two) times daily as needed for anxiety. 30 tablet 1  ? metoprolol succinate (TOPROL-XL) 50 MG 24 hr tablet Take 1 tablet (50 mg total) by mouth 2 (two) times daily. Take with or immediately following a meal. 180 tablet 1  ? Vitamin D, Ergocalciferol, (DRISDOL) 1.25 MG (50000 UNIT) CAPS capsule Take 1 capsule (50,000 Units total) by mouth every 7 (seven) days. 4 capsule 4  ? cholecalciferol (VITAMIN D3) 25 MCG (1000 UNIT) tablet Take 2,000 Units by mouth daily. (Patient not taking: Reported on 08/14/2021)    ? Ferrous Fumarate-Folic Acid 027-7 MG TABS Take 1 tablet by mouth daily. (Patient not taking: Reported on 08/14/2021) 30 tablet 2  ? apixaban (ELIQUIS) 2.5 MG TABS tablet Take 1 tablet (2.5 mg total) by mouth 2 (two) times daily. (Patient not taking: Reported on 08/14/2021) 60 tablet 6  ? ?No facility-administered medications prior to visit.  ? ? ?Allergies  ?Allergen Reactions  ? Losartan  Other (See Comments)  ?  Diarrhea and abodminal cramping  ? Oxycodone Itching  ? ? ?Review of Systems ?All review of systems negative except what is listed in the HPI ? ?   ?Objective:  ?  ?Physical Exam ?Vitals reviewed.  ?Constitutional:   ?   Appearance: Normal appearance.  ?  Pulmonary:  ?   Effort: Pulmonary effort is normal.  ?Abdominal:  ?   General: Abdomen is flat. Bowel sounds are normal. There is no distension.  ?   Palpations: Abdomen is soft. There is no mass.  ?   Tenderness: There is no abdominal tenderness. There is no right CVA tenderness, left CVA tenderness, guarding or rebound.  ?Musculoskeletal:  ?   Comments: Muscle tension noted to right lower back, full ROM  ?Neurological:  ?   General: No focal deficit present.  ?   Mental Status: She is alert and oriented to person, place, and time. Mental status is at baseline.  ?Psychiatric:     ?   Mood and Affect: Mood normal.     ?   Behavior: Behavior normal.     ?   Thought Content: Thought content normal.     ?   Judgment: Judgment normal.  ? ? ?BP 121/75   Pulse 95   Temp 98.5 ?F (36.9 ?C)   Resp 20   Ht '5\' 3"'$  (1.6 m)   Wt 216 lb 6.4 oz (98.2 kg)   SpO2 99%   BMI 38.33 kg/m?  ?Wt Readings from Last 3 Encounters:  ?08/30/21 216 lb 6.4 oz (98.2 kg)  ?08/14/21 214 lb 12.8 oz (97.4 kg)  ?07/07/21 207 lb 9.6 oz (94.2 kg)  ? ? ?Health Maintenance Due  ?Topic Date Due  ? FOOT EXAM  Never done  ? Zoster Vaccines- Shingrix (1 of 2) Never done  ? ? ?There are no preventive care reminders to display for this patient. ? ? ?Lab Results  ?Component Value Date  ? TSH 2.65 08/14/2021  ? ?Lab Results  ?Component Value Date  ? WBC 6.6 08/14/2021  ? HGB 12.0 08/14/2021  ? HCT 35.1 (L) 08/14/2021  ? MCV 83.8 08/14/2021  ? PLT 258.0 08/14/2021  ? ?Lab Results  ?Component Value Date  ? NA 139 08/14/2021  ? K 3.8 08/14/2021  ? CO2 30 08/14/2021  ? GLUCOSE 84 08/14/2021  ? BUN 16 08/14/2021  ? CREATININE 0.63 08/14/2021  ? BILITOT 0.3 08/14/2021  ? ALKPHOS 54  08/14/2021  ? AST 14 08/14/2021  ? ALT 16 08/14/2021  ? PROT 6.8 08/14/2021  ? ALBUMIN 4.2 08/14/2021  ? CALCIUM 9.3 08/14/2021  ? ANIONGAP 7 01/23/2021  ? GFR 103.48 08/14/2021  ? ?Lab Results  ?Component Value Dat

## 2021-08-30 NOTE — Patient Instructions (Signed)
Urine with no obvious signs of kidney stones or infection. We will send for urine culture to be sure nothing grows. Continue to monitor your symptoms and let us know if anything changes.  ?You do have some muscle tension on that side of your low back so let's start by treating it as musculoskeletal for now. Rest, ice, heat, home exercises (handout provided), meloxicam daily for the next few weeks then as needed.  ? ?Please contact office for follow-up if symptoms do not improve or worsen. Seek emergency care if symptoms become severe. ? ?

## 2021-08-31 ENCOUNTER — Ambulatory Visit (HOSPITAL_BASED_OUTPATIENT_CLINIC_OR_DEPARTMENT_OTHER)
Admission: RE | Admit: 2021-08-31 | Discharge: 2021-08-31 | Disposition: A | Discharge status: Home / Self Care | Payer: Federal, State, Local not specified - PPO | Source: Ambulatory Visit | Attending: Family Medicine | Admitting: Family Medicine

## 2021-08-31 ENCOUNTER — Telehealth: Payer: Self-pay | Admitting: Family Medicine

## 2021-08-31 DIAGNOSIS — R350 Frequency of micturition: Secondary | ICD-10-CM | POA: Diagnosis not present

## 2021-08-31 DIAGNOSIS — R109 Unspecified abdominal pain: Secondary | ICD-10-CM

## 2021-08-31 DIAGNOSIS — K3189 Other diseases of stomach and duodenum: Secondary | ICD-10-CM | POA: Diagnosis not present

## 2021-08-31 DIAGNOSIS — N200 Calculus of kidney: Secondary | ICD-10-CM | POA: Diagnosis not present

## 2021-08-31 DIAGNOSIS — K429 Umbilical hernia without obstruction or gangrene: Secondary | ICD-10-CM | POA: Diagnosis not present

## 2021-08-31 LAB — URINE CULTURE
MICRO NUMBER:: 13225773
Result:: NO GROWTH
SPECIMEN QUALITY:: ADEQUATE

## 2021-08-31 NOTE — Telephone Encounter (Signed)
Pt states back pain is not better. She is concerned she may have a kidney stone and thinks she may need some type of scan done. Please advise.  ?

## 2021-08-31 NOTE — Telephone Encounter (Signed)
Pt notified of ct order. ?

## 2021-09-01 ENCOUNTER — Other Ambulatory Visit: Payer: Self-pay

## 2021-09-01 ENCOUNTER — Emergency Department (HOSPITAL_COMMUNITY): Payer: Federal, State, Local not specified - PPO

## 2021-09-01 ENCOUNTER — Encounter (HOSPITAL_COMMUNITY): Payer: Self-pay

## 2021-09-01 ENCOUNTER — Emergency Department (HOSPITAL_COMMUNITY)
Admission: EM | Admit: 2021-09-01 | Discharge: 2021-09-01 | Disposition: A | Discharge status: Home / Self Care | Payer: Federal, State, Local not specified - PPO | Attending: Emergency Medicine | Admitting: Emergency Medicine

## 2021-09-01 DIAGNOSIS — Z79899 Other long term (current) drug therapy: Secondary | ICD-10-CM | POA: Insufficient documentation

## 2021-09-01 DIAGNOSIS — M9903 Segmental and somatic dysfunction of lumbar region: Secondary | ICD-10-CM | POA: Diagnosis not present

## 2021-09-01 DIAGNOSIS — M5417 Radiculopathy, lumbosacral region: Secondary | ICD-10-CM | POA: Diagnosis not present

## 2021-09-01 DIAGNOSIS — M5416 Radiculopathy, lumbar region: Secondary | ICD-10-CM | POA: Diagnosis not present

## 2021-09-01 DIAGNOSIS — R1011 Right upper quadrant pain: Secondary | ICD-10-CM | POA: Insufficient documentation

## 2021-09-01 DIAGNOSIS — M62830 Muscle spasm of back: Secondary | ICD-10-CM | POA: Diagnosis not present

## 2021-09-01 DIAGNOSIS — R1031 Right lower quadrant pain: Secondary | ICD-10-CM | POA: Diagnosis not present

## 2021-09-01 DIAGNOSIS — R102 Pelvic and perineal pain: Secondary | ICD-10-CM | POA: Diagnosis not present

## 2021-09-01 DIAGNOSIS — M545 Low back pain, unspecified: Secondary | ICD-10-CM | POA: Insufficient documentation

## 2021-09-01 DIAGNOSIS — Z90710 Acquired absence of both cervix and uterus: Secondary | ICD-10-CM | POA: Diagnosis not present

## 2021-09-01 LAB — BASIC METABOLIC PANEL
Anion gap: 8 (ref 5–15)
BUN: 16 mg/dL (ref 6–20)
CO2: 25 mmol/L (ref 22–32)
Calcium: 9.1 mg/dL (ref 8.9–10.3)
Chloride: 106 mmol/L (ref 98–111)
Creatinine, Ser: 0.62 mg/dL (ref 0.44–1.00)
GFR, Estimated: >60 mL/min (ref >60)
Glucose, Bld: 100 mg/dL — ABNORMAL HIGH (ref 70–99)
Potassium: 3.7 mmol/L (ref 3.5–5.1)
Sodium: 139 mmol/L (ref 135–145)

## 2021-09-01 LAB — LIPASE, BLOOD: Lipase: 25 U/L (ref 11–51)

## 2021-09-01 LAB — HEPATIC FUNCTION PANEL
ALT: 21 U/L (ref 0–44)
AST: 19 U/L (ref 15–41)
Albumin: 4 g/dL (ref 3.5–5.0)
Alkaline Phosphatase: 55 U/L (ref 38–126)
Bilirubin, Direct: <0.1 mg/dL (ref 0.0–0.2)
Total Bilirubin: 0.5 mg/dL (ref 0.3–1.2)
Total Protein: 7.4 g/dL (ref 6.5–8.1)

## 2021-09-01 LAB — URINALYSIS, ROUTINE W REFLEX MICROSCOPIC
Bilirubin Urine: NEGATIVE
Glucose, UA: NEGATIVE mg/dL
Hgb urine dipstick: NEGATIVE
Ketones, ur: NEGATIVE mg/dL
Leukocytes,Ua: NEGATIVE
Nitrite: NEGATIVE
Protein, ur: NEGATIVE mg/dL
Specific Gravity, Urine: 1.01 (ref 1.005–1.030)
pH: 7 (ref 5.0–8.0)

## 2021-09-01 LAB — CBC
HCT: 38.3 % (ref 36.0–46.0)
Hemoglobin: 12.8 g/dL (ref 12.0–15.0)
MCH: 28.6 pg (ref 26.0–34.0)
MCHC: 33.4 g/dL (ref 30.0–36.0)
MCV: 85.5 fL (ref 80.0–100.0)
Platelets: 257 10*3/uL (ref 150–400)
RBC: 4.48 MIL/uL (ref 3.87–5.11)
RDW: 13.2 % (ref 11.5–15.5)
WBC: 5.9 10*3/uL (ref 4.0–10.5)
nRBC: 0 % (ref 0.0–0.2)

## 2021-09-01 MED ORDER — ACETAMINOPHEN 500 MG PO TABS
1000.0000 mg | ORAL_TABLET | Freq: Once | ORAL | Status: AC
  Administered 2021-09-01: 1000 mg via ORAL
  Filled 2021-09-01: qty 2

## 2021-09-01 MED ORDER — METHOCARBAMOL 500 MG PO TABS
500.0000 mg | ORAL_TABLET | Freq: Once | ORAL | Status: AC
Start: 2021-09-01 — End: 2021-09-01
  Administered 2021-09-01: 500 mg via ORAL
  Filled 2021-09-01: qty 1

## 2021-09-01 NOTE — Discharge Instructions (Addendum)
We saw you in the ER for abdominal pain.  ?All the results in the ER are normal, labs and imaging. ?We are not sure what is causing your symptoms. ?The workup in the ER is not complete, and is limited to screening for life threatening and emergent conditions only, so please see a primary care doctor for further evaluation. ? ?Please return to the ER if your symptoms worsen; you have increased pain, fevers, chills, inability to keep any medications down, confusion, bloody stools or bloody vomit. Otherwise see the outpatient doctor as requested. ?

## 2021-09-01 NOTE — ED Triage Notes (Signed)
Patient having right lower back pain since Sunday. She had a CT done last night, no kidney stone, but it mentioned something about a hernia. Patient was unable to sleep and cant get comfortable. She said her lower back pain that wraps around the front of her stomach. ?

## 2021-09-01 NOTE — ED Notes (Signed)
Patient transported to [facility] via wheelchair

## 2021-09-04 ENCOUNTER — Telehealth: Payer: Self-pay | Admitting: Family Medicine

## 2021-09-04 NOTE — Telephone Encounter (Signed)
Pt stated she was advised by Lovena Le if back pain is still ongoing to contact office.  ? ?Pt states pain is still there and visited the ed on Friday for pain.  ? ?Please advise. ?

## 2021-09-04 NOTE — Telephone Encounter (Signed)
Pt is calling again regarding back pain. Please advise.  ?

## 2021-09-05 ENCOUNTER — Telehealth: Payer: Self-pay | Admitting: Family Medicine

## 2021-09-05 DIAGNOSIS — R109 Unspecified abdominal pain: Secondary | ICD-10-CM

## 2021-09-05 MED ORDER — TAMSULOSIN HCL 0.4 MG PO CAPS: 0.4000 mg | ORAL_CAPSULE | Freq: Every day | ORAL | 0 refills | Status: DC

## 2021-09-05 MED ORDER — TRAMADOL HCL 50 MG PO TABS: 50.0000 mg | ORAL_TABLET | Freq: Three times a day (TID) | ORAL | 0 refills | Status: AC | PRN

## 2021-09-05 NOTE — Telephone Encounter (Signed)
Pt notified and is agreeable to provider's plan. ?

## 2021-09-05 NOTE — Telephone Encounter (Signed)
See other phone note

## 2021-09-05 NOTE — ED Provider Notes (Signed)
?[facility] DEPT ?Provider Note ? ? ?CSN: [id number] ?Arrival date & time: 09/01/21  0531 ? ?  ? ?History ? ?Chief Complaint  ?Patient presents with  ? Back Pain  ? ? ?Julie Bishop is a 51 y.o. female. ? ?HPI ? ?  ? ?51 year old female comes in with chief complaint of back pain.  Patient indicates that she been having back pain over the last few days.  She has a CT scan for kidney stone done recently which was negative.  Her pain is located over the right flank region and radiates towards the right lateral region.  She denies any history of epigastric abdominal pain.  She has no history of clotting disorder.  She denies any UTI-like symptoms. ? ?Home Medications ?Prior to Admission medications   ?Medication Sig Start Date End Date Taking? Authorizing Provider  ?acetaminophen (TYLENOL) 500 MG tablet Take 1,000 mg by mouth every 6 (six) hours as needed.     [provider]  ?atorvastatin (LIPITOR) 10 MG tablet Take 1 tablet (10 mg total) by mouth daily. 12/28/20 12/28/21  Richardson Dopp T, PA-C  ?cholecalciferol (VITAMIN D3) 25 MCG (1000 UNIT) tablet Take 2,000 Units by mouth daily. ?Patient not taking: Reported on 08/14/2021    [provider]  ?Dulaglutide (TRULICITY) 5.28 UX/3.2GM SOPN Inject 0.75 mg into the skin once a week. 08/14/21 09/13/21  [name], MD  ?DULoxetine (CYMBALTA) 30 MG capsule Take 1 capsule (30 mg total) by mouth 3 (three) times daily. 08/14/21   [name], MD  ?Enalapril-hydroCHLOROthiazide 5-12.5 MG tablet TAKE ONE TABLET BY MOUTH DAILY 08/14/21   [name], MD  ?Ferrous Fumarate-Folic Acid 010-2 MG TABS Take 1 tablet by mouth daily. ?Patient not taking: Reported on 08/14/2021 05/06/20   [name], MD  ?LORazepam (ATIVAN) 0.5 MG tablet Take 1 tablet (0.5 mg total) by mouth 2 (two) times daily as needed for anxiety. 08/14/21   [name], MD  ?meloxicam (MOBIC) 15 MG tablet Take 1 tablet (15 mg total) by mouth daily. 08/30/21    Terrilyn Saver, NP  ?metoprolol succinate (TOPROL-XL) 50 MG 24 hr tablet Take 1 tablet (50 mg total) by mouth 2 (two) times daily. Take with or immediately following a meal. 08/14/21   [name], MD  ?tamsulosin (FLOMAX) 0.4 MG CAPS capsule Take 1 capsule (0.4 mg total) by mouth daily. 09/05/21   Terrilyn Saver, NP  ?traMADol (ULTRAM) 50 MG tablet Take 1 tablet (50 mg total) by mouth every 8 (eight) hours as needed for up to 3 days for severe pain. 09/05/21 09/08/21  Terrilyn Saver, NP  ?Vitamin D, Ergocalciferol, (DRISDOL) 1.25 MG (50000 UNIT) CAPS capsule Take 1 capsule (50,000 Units total) by mouth every 7 (seven) days. 08/17/21   [name], MD  ?   ? ?Allergies    ?Losartan and Oxycodone   ? ?Review of Systems   ?Review of Systems  ?All other systems reviewed and are negative. ? ?Physical Exam ?Updated Vital Signs ?BP (!) 167/105 (BP Location: Right Arm)   Pulse 61   Temp 98.5 ?F (36.9 ?C) (Oral)   Resp 17   SpO2 100%  ?Physical Exam ?Vitals and nursing note reviewed.  ?Constitutional:   ?   Appearance: She is well-developed.  ?HENT:  ?   Head: Atraumatic.  ?Cardiovascular:  ?   Rate and Rhythm: Normal rate.  ?Pulmonary:  ?   Effort: Pulmonary effort is normal.  ?  Abdominal:  ?   Tenderness: There is abdominal tenderness.  ?   Comments: Right lateral, right upper quadrant, right lower quadrant and right flank tenderness but no guarding.  ?Musculoskeletal:  ?   Cervical back: Normal range of motion and neck supple.  ?Skin: ?   General: Skin is warm and dry.  ?Neurological:  ?   Mental Status: She is alert and oriented to person, place, and time.  ? ? ?ED Results / Procedures / Treatments   ?Labs ?(all labs ordered are listed, but only abnormal results are displayed) ?Labs Reviewed  ?BASIC METABOLIC PANEL - Abnormal; Notable for the following components:  ?    Result Value  ? Glucose, Bld 100 (*)   ? All other components within normal limits  ?URINALYSIS, ROUTINE W REFLEX MICROSCOPIC - Abnormal;  Notable for the following components:  ? Color, Urine STRAW (*)   ? All other components within normal limits  ?CBC  ?LIPASE, BLOOD  ?HEPATIC FUNCTION PANEL  ? ? ?EKG ?None ? ?Radiology ?No results found. ? ?Procedures ?Procedures  ? ? ?Medications Ordered in ED ?Medications  ?acetaminophen (TYLENOL) tablet 1,000 mg (1,000 mg Oral Given 09/01/21 0751)  ?methocarbamol (ROBAXIN) tablet 500 mg (500 mg Oral Given 09/01/21 0751)  ? ? ?ED Course/ Medical Decision Making/ A&P ?  ?                        ?Medical Decision Making ?Amount and/or Complexity of Data Reviewed ?Labs: ordered. ?Radiology: ordered. ? ?Risk ?OTC drugs. ?Prescription drug management. ? ? ?51 year old female comes in with chief complaint of right-sided abdominal and back pain.  She is having flank pain that is radiating towards the right lower quadrant. ? ?She had a CT renal stone done recently.  CT results reviewed is negative for stones.  ? ?She however has been very uncomfortable with this pain.  Given the severity of her symptoms, differential diagnosis will include ovarian cyst, ovarian tumor, uterine mass.  Other possibilities include cholelithiasis. ? ?Ultrasounds ordered.  There is no concerning findings on any of those test.  Her urine analysis is negative for any inflammatory markers, bacteria or blood.  White count is normal.  Creatinine is normal. ? ?At this time we will treat the symptoms conservatively.  ER return precautions discussed. ? ?Final Clinical Impression(s) / ED Diagnoses ?Final diagnoses:  ?Acute low back pain without sciatica, unspecified back pain laterality  ? ? ?Rx / DC Orders ?ED Discharge Orders   ? ? None  ? ?  ? ? ?  ?Varney Biles, MD ?09/05/21 2343 ? ?

## 2021-09-05 NOTE — Telephone Encounter (Signed)
Please update patient: I still cannot see the entire ED summary, but their imaging was clear. The "punctate stone" previously seen may not be the cause of her pain, but we can try treating like it is. They didn't list a size, so I'm guessing it's pretty small. I'll give a few days of tramadol and flomax just to see if that will make a difference. Otherwise could be musculoskeletal and we can reevaluate or send to sports medicine.  ?

## 2021-09-21 DIAGNOSIS — M9903 Segmental and somatic dysfunction of lumbar region: Secondary | ICD-10-CM | POA: Diagnosis not present

## 2021-09-21 DIAGNOSIS — M5417 Radiculopathy, lumbosacral region: Secondary | ICD-10-CM | POA: Diagnosis not present

## 2021-09-21 DIAGNOSIS — M62830 Muscle spasm of back: Secondary | ICD-10-CM | POA: Diagnosis not present

## 2021-09-21 DIAGNOSIS — M5416 Radiculopathy, lumbar region: Secondary | ICD-10-CM | POA: Diagnosis not present

## 2021-09-22 DIAGNOSIS — M25872 Other specified joint disorders, left ankle and foot: Secondary | ICD-10-CM | POA: Diagnosis not present

## 2021-09-26 DIAGNOSIS — M25872 Other specified joint disorders, left ankle and foot: Secondary | ICD-10-CM | POA: Diagnosis not present

## 2021-09-28 ENCOUNTER — Ambulatory Visit: Payer: Federal, State, Local not specified - PPO | Admitting: Podiatry

## 2021-10-03 DIAGNOSIS — M67472 Ganglion, left ankle and foot: Secondary | ICD-10-CM | POA: Insufficient documentation

## 2021-10-03 DIAGNOSIS — E119 Type 2 diabetes mellitus without complications: Secondary | ICD-10-CM | POA: Insufficient documentation

## 2021-10-26 ENCOUNTER — Other Ambulatory Visit: Payer: Self-pay | Admitting: Family Medicine

## 2021-10-26 DIAGNOSIS — I10 Essential (primary) hypertension: Secondary | ICD-10-CM

## 2021-11-22 DIAGNOSIS — H43393 Other vitreous opacities, bilateral: Secondary | ICD-10-CM | POA: Diagnosis not present

## 2021-11-22 DIAGNOSIS — H43813 Vitreous degeneration, bilateral: Secondary | ICD-10-CM | POA: Diagnosis not present

## 2021-11-22 DIAGNOSIS — H59811 Chorioretinal scars after surgery for detachment, right eye: Secondary | ICD-10-CM | POA: Diagnosis not present

## 2021-11-22 DIAGNOSIS — H35371 Puckering of macula, right eye: Secondary | ICD-10-CM | POA: Diagnosis not present

## 2021-11-22 NOTE — Progress Notes (Signed)
Patient did not answer phone for visit

## 2021-11-23 ENCOUNTER — Telehealth: Payer: Federal, State, Local not specified - PPO | Admitting: Family Medicine

## 2021-11-23 DIAGNOSIS — E559 Vitamin D deficiency, unspecified: Secondary | ICD-10-CM

## 2021-11-23 DIAGNOSIS — I10 Essential (primary) hypertension: Secondary | ICD-10-CM

## 2021-11-23 DIAGNOSIS — E1169 Type 2 diabetes mellitus with other specified complication: Secondary | ICD-10-CM

## 2021-11-23 DIAGNOSIS — F419 Anxiety disorder, unspecified: Secondary | ICD-10-CM

## 2021-11-29 ENCOUNTER — Ambulatory Visit (INDEPENDENT_AMBULATORY_CARE_PROVIDER_SITE_OTHER): Payer: Federal, State, Local not specified - PPO | Admitting: Pulmonary Disease

## 2021-11-29 ENCOUNTER — Encounter: Payer: Self-pay | Admitting: Pulmonary Disease

## 2021-11-29 VITALS — BP 122/78 | HR 72 | Temp 97.9°F | Ht 63.0 in | Wt 224.8 lb

## 2021-11-29 DIAGNOSIS — G4733 Obstructive sleep apnea (adult) (pediatric): Secondary | ICD-10-CM | POA: Diagnosis not present

## 2021-11-29 NOTE — Patient Instructions (Signed)
Will arrange for home sleep study Will call to arrange for follow up after sleep study reviewed  

## 2021-11-29 NOTE — Progress Notes (Signed)
[facility]  Chief Complaint  Patient presents with   Consult    She wants to get a CPAP back,. She wants to talk about options,     Past Surgical History:  She  has a past surgical history that includes Abdominal hysterectomy (2010); Ectopic pregnancy surgery; Breast biopsy; Tubal ligation; Shoulder open rotator cuff repair (2017); Tympanostomy tube placement; and Retinal detachment surgery (Right).  Past Medical History:  Anxiety, Depression, Back pain, DVT Lt leg, DM type 2, GERD, HLD, HTN, Raynaud syndrome, Vit B12 deficiency, Vit D deficiency  Constitutional:  BP 122/78 (BP Location: Left Arm, Cuff Size: Normal)   Pulse 72   Temp 97.9 F (36.6 C) (Oral)   Ht '5\' 3"'$  (1.6 m)   Wt 224 lb 12.8 oz (102 kg)   SpO2 99%   BMI 39.82 kg/m   Brief Summary:  Julie Bishop is a 51 y.o. female with obstructive sleep apnea.      Subjective:   I saw her in 2018.  At the time her weight was 2015 lbs.  She did a home sleep study that showed moderate sleep apnea.  She got set up with auto CPAP through [facility].  She didn't realize she needed pulmonary follow up.  As she gained weight her mask fit became an issue and CPAP was not comfortable to wear.  She has not used CPAP for a while.  Her family has been concerned about her snoring and that she continues to stop breathing while asleep.  She has trouble staying awake when reading or watching TV.  She had a family friend who had untreated sleep apnea and recently passed away in his sleep.  This made her concerned that she needed to reassess her sleep disordered breathing.  She goes to sleep between 930 and 1030 pm.  She falls asleep in 15 minutes.  She wakes up 3 or 4 times to use the bathroom.  She gets out of bed between 530 and 7 am.  She feels tired in the morning.  She denies morning headache.  She does not use anything to help her fall sleep.  She drinks coffee in  the morning.  She denies sleep walking, sleep talking, bruxism, or nightmares.  There is no history of restless legs.  She denies sleep hallucinations, sleep paralysis, or cataplexy.  The Epworth score is 6 out of 24.   Physical Exam:   Appearance - well kempt   ENMT - no sinus tenderness, no oral exudate, no LAN, Mallampati 3 airway, no stridor  Respiratory - equal breath sounds bilaterally, no wheezing or rales  CV - s1s2 regular rate and rhythm, no murmurs  Ext - no clubbing, no edema  Skin - no rashes  Psych - normal mood and affect   Sleep Tests:  HST 09/06/16 >> AHI 17.5, SaO2 low 77%  Cardiac Tests:  Echo 07/22/19 >> EF 60 to 65%, mild elevation in PASP  Social History:  She  reports that she has never smoked. She has never used smokeless tobacco. She reports that she does not currently use alcohol. She reports that she does not use drugs.  Family History:  Her family history includes Arthritis in her paternal aunt; Cancer in her maternal grandmother and paternal grandmother; Depression in her mother; Diabetes in her father and mother; Heart attack in her maternal grandmother; Heart attack (age of onset: 64) in her mother; Heart disease in her paternal grandmother;  Hyperlipidemia in her mother; Hypertension in her father, mother, and sister; Liver disease in her mother; Obesity in her mother; Sleep apnea in her mother; Vascular Disease in her father.    Discussion:  She has snoring, sleep disruption, apnea, and daytime sleepiness.  She has history of hypertension, diabetes, and depression.  She has prior history of sleep apnea and has gained weight since then with her BMI now > 35.  I am concerned she still has significant sleep apnea.  Assessment/Plan:   Snoring with excessive daytime sleepiness. - will need to arrange for a home sleep study  Obesity. - discussed how weight can impact sleep and risk for sleep disordered breathing - discussed options to assist with  weight loss: combination of diet modification, cardiovascular and strength training exercises  Cardiovascular risk. - had an extensive discussion regarding the adverse health consequences related to untreated sleep disordered breathing - specifically discussed the risks for hypertension, coronary artery disease, cardiac dysrhythmias, cerebrovascular disease, and diabetes - lifestyle modification discussed  Safe driving practices. - discussed how sleep disruption can increase risk of accidents, particularly when driving - safe driving practices were discussed  Therapies for obstructive sleep apnea. - if the sleep study shows significant sleep apnea, then various therapies for treatment were reviewed: CPAP, oral appliance, and surgical interventions  Time Spent Involved in Patient Care on Day of Examination:  36 minutes  Follow up:   Patient Instructions  Will arrange for home sleep study Will call to arrange for follow up after sleep study reviewed   Medication List:   Allergies as of 11/29/2021       Reactions   Losartan Other (See Comments)   Diarrhea and abodminal cramping   Oxycodone Itching        Medication List        Accurate as of November 29, 2021  4:36 PM. If you have any questions, ask your nurse or doctor.          acetaminophen 500 MG tablet Commonly known as: TYLENOL Take 1,000 mg by mouth every 6 (six) hours as needed.   atorvastatin 10 MG tablet Commonly known as: LIPITOR Take 1 tablet (10 mg total) by mouth daily.   cholecalciferol 25 MCG (1000 UNIT) tablet Commonly known as: VITAMIN D3 Take 2,000 Units by mouth daily.   DULoxetine 30 MG capsule Commonly known as: CYMBALTA TAKE ONE CAPSULE BY MOUTH THREE TIMES A DAY   Enalapril-hydroCHLOROthiazide 5-12.5 MG tablet TAKE ONE TABLET BY MOUTH DAILY   Ferrous Fumarate-Folic Acid 962-2 MG Tabs Take 1 tablet by mouth daily.   LORazepam 0.5 MG tablet Commonly known as: ATIVAN Take 1 tablet (0.5  mg total) by mouth 2 (two) times daily as needed for anxiety.   meloxicam 15 MG tablet Commonly known as: MOBIC Take 1 tablet (15 mg total) by mouth daily.   metoprolol succinate 50 MG 24 hr tablet Commonly known as: TOPROL-XL TAKE ONE TABLET BY MOUTH TWICE A DAY WITH OR IMMEDIATELY FOLLOWING A MEAL   tamsulosin 0.4 MG Caps capsule Commonly known as: FLOMAX Take 1 capsule (0.4 mg total) by mouth daily.   Vitamin D (Ergocalciferol) 1.25 MG (50000 UNIT) Caps capsule Commonly known as: DRISDOL Take 1 capsule (50,000 Units total) by mouth every 7 (seven) days.        Signature:  Chesley Mires, MD [place] Pager - [phone] 11/29/2021, 4:36 PM

## 2021-12-08 DIAGNOSIS — L918 Other hypertrophic disorders of the skin: Secondary | ICD-10-CM | POA: Diagnosis not present

## 2021-12-08 DIAGNOSIS — L570 Actinic keratosis: Secondary | ICD-10-CM | POA: Diagnosis not present

## 2021-12-08 DIAGNOSIS — L578 Other skin changes due to chronic exposure to nonionizing radiation: Secondary | ICD-10-CM | POA: Diagnosis not present

## 2021-12-19 ENCOUNTER — Telehealth (INDEPENDENT_AMBULATORY_CARE_PROVIDER_SITE_OTHER): Payer: Federal, State, Local not specified - PPO | Admitting: Family Medicine

## 2021-12-19 ENCOUNTER — Encounter: Payer: Self-pay | Admitting: Family Medicine

## 2021-12-19 DIAGNOSIS — I10 Essential (primary) hypertension: Secondary | ICD-10-CM

## 2021-12-19 DIAGNOSIS — E1169 Type 2 diabetes mellitus with other specified complication: Secondary | ICD-10-CM

## 2021-12-19 DIAGNOSIS — E538 Deficiency of other specified B group vitamins: Secondary | ICD-10-CM

## 2021-12-19 DIAGNOSIS — E559 Vitamin D deficiency, unspecified: Secondary | ICD-10-CM

## 2021-12-19 DIAGNOSIS — F419 Anxiety disorder, unspecified: Secondary | ICD-10-CM | POA: Diagnosis not present

## 2021-12-19 DIAGNOSIS — E785 Hyperlipidemia, unspecified: Secondary | ICD-10-CM

## 2021-12-19 DIAGNOSIS — F32A Depression, unspecified: Secondary | ICD-10-CM

## 2021-12-19 MED ORDER — RYBELSUS 3 MG PO TABS: 3.0000 mg | ORAL_TABLET | Freq: Every day | ORAL | 1 refills | Status: DC

## 2021-12-19 NOTE — Progress Notes (Signed)
MyChart Video Visit    Virtual Visit via Video Note   This visit type was conducted due to national recommendations for restrictions regarding the COVID-19 Pandemic (e.g. social distancing) in an effort to limit this patient's exposure and mitigate transmission in our community. This patient is at least at moderate risk for complications without adequate follow up. This format is felt to be most appropriate for this patient at this time. Physical exam was limited by quality of the video and audio technology used for the visit. Tomekia, CMA was able to get the patient set up on a video visit.  Patient location: Home patient and provider in visit Provider location: Office  I discussed the limitations of evaluation and management by telemedicine and the availability of in person appointments. The patient expressed understanding and agreed to proceed.  Visit Date: 12/19/2021  Today's healthcare provider: Penni Homans, MD     Subjective:    Patient ID: Julie Bishop, female    DOB: March 25, 1971, 51 y.o.   MRN: [id number]  Chief Complaint  Patient presents with   Follow-up   HPI Patient is in today for a virtual visit.  Weight: She reports that she has stopped taking Trulicity 3.90 mg due to it making her feel nauseous, though it did curve her appetite. She is open to taking Rybelsus or Ozempic to help manage her weight.  Wt Readings from Last 3 Encounters:  11/29/21 224 lb 12.8 oz (102 kg)  08/30/21 216 lb 6.4 oz (98.2 kg)  08/14/21 214 lb 12.8 oz (97.4 kg)   Blood pressure: She states that she does not check her blood pressure regularly. Her blood pressure was within normal range during her last visit. BP Readings from Last 3 Encounters:  11/29/21 122/78  09/01/21 (!) 167/105  08/30/21 121/75   Pulse Readings from Last 3 Encounters:  11/29/21 72  09/01/21 61  08/30/21 95   Vitamin D: She reports that she has not been taking vitamin D nor multivitamins.    Cholesterol: She is currently taking Atorvastatin 10 mg to manage her cholesterol. Lab Results  Component Value Date   CHOL 194 08/14/2021   HDL 88.30 08/14/2021   LDLCALC 86 08/14/2021   TRIG 99.0 08/14/2021   CHOLHDL 2 08/14/2021   Nocturia: She reports that she is waking up to use the restroom twice a night.  Past Medical History:  Diagnosis Date   Adjustment disorder 01/25/2017   Anemia    Anxiety and depression    Back pain    Cancer ([place]) 2008   skin cancer, BCC   Constipation 11/14/2014   Deep vein blood clot of left lower extremity ([place]) 12/2019   Depression    Diabetes mellitus without complication (HCC)    Generalized anxiety disorder    GERD (gastroesophageal reflux disease)    Headache 08/24/2015   History of cardiovascular stress test    GXT 3/19: No ischemic ECG changes   History of echocardiogram    Echo 2/18:  EF 50-55, GLS -20% (normal), normal wall motion, grade 1 diastolic dysfunction, trivial MR, mild RVE, normal RV SF, trivial TR, PASP 23   Hyperlipidemia    Elevated, but never taken medication   Hypertension    Insomnia 08/24/2015   Obesity 11/14/2014   OSA (obstructive sleep apnea) 09/12/2016   Pain in joint, shoulder region 11/27/2015   Palpitations    Post-menopause 08/23/2015   Raynauds syndrome    Sleep apnea    appt with pulmonary  Dr in march 2018   Vitamin B12 deficiency 08/23/2015   Vitamin D deficiency 08/23/2015   Past Surgical History:  Procedure Laterality Date   ABDOMINAL HYSTERECTOMY  2010   BREAST BIOPSY     right, 1996   ECTOPIC PREGNANCY SURGERY     RETINAL DETACHMENT SURGERY Right    SHOULDER OPEN ROTATOR CUFF REPAIR  2017   right   TUBAL LIGATION     on right   TYMPANOSTOMY TUBE PLACEMENT     Family History  Problem Relation Age of Onset   Diabetes Mother    Depression Mother    Hypertension Mother    Hyperlipidemia Mother    Liver disease Mother    Sleep apnea Mother    Obesity Mother    Heart attack Mother 54        CAD w/MI in mid 63s; s/p PCI   Diabetes Father    Hypertension Father    Vascular Disease Father        subclavian artery obstruction s/p stent   Hypertension Sister        as a teenager   Arthritis Paternal Aunt    Cancer Maternal Grandmother        throat   Heart attack Maternal Grandmother    Cancer Paternal Grandmother        lung   Heart disease Paternal Grandmother    Social History   Socioeconomic History   Marital status: Married    Spouse name: Quiera Diffee   Number of children: 1   Years of education: 12   Highest education level: Not on file  Occupational History   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[employer]  Tobacco Use   Smoking status: Never   Smokeless tobacco: Never  Vaping Use   Vaping Use: Never used  Substance and Sexual Activity   Alcohol use: Not Currently   Drug use: No   Sexual activity: Yes    Partners: Male    Birth control/protection: Post-menopausal    Comment: 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[place], Charity fundraiser., lives with husband  Other Topics Concern   Not on file  Social History Narrative   Not on file   Social Determinants of Health   Financial Resource Strain: Not on file  Food Insecurity: No Food Insecurity (03/09/2020)   Hunger Vital Sign    Worried About Running Out of Food in the Last Year: Never true    Ran Out of Food in the Last Year: Never true  Transportation Needs: Not on file  Physical Activity: Not on file  Stress: Not on file  Social Connections: Not on file  Intimate Partner Violence: Not on file   Outpatient Medications Prior to Visit  Medication Sig Dispense Refill   acetaminophen (TYLENOL) 500 MG tablet Take 1,000 mg by mouth every 6 (six) hours as needed.      atorvastatin (LIPITOR) 10 MG tablet Take 1 tablet (10 mg total) by mouth daily. 90 tablet 3   cholecalciferol (VITAMIN D3) 25 MCG (1000 UNIT) tablet Take 2,000 Units by mouth daily.     DULoxetine (CYMBALTA) 30 MG capsule TAKE ONE  CAPSULE BY MOUTH THREE TIMES A DAY 270 capsule 1   Enalapril-hydroCHLOROthiazide 5-12.5 MG tablet TAKE ONE TABLET BY MOUTH DAILY 90 tablet 1   Ferrous Fumarate-Folic Acid 786-7 MG TABS Take 1 tablet by mouth daily. 30 tablet 2   LORazepam (ATIVAN) 0.5 MG tablet Take 1 tablet (0.5 mg total) by mouth 2 (two)  times daily as needed for anxiety. 30 tablet 1   meloxicam (MOBIC) 15 MG tablet Take 1 tablet (15 mg total) by mouth daily. 30 tablet 0   metoprolol succinate (TOPROL-XL) 50 MG 24 hr tablet TAKE ONE TABLET BY MOUTH TWICE A DAY WITH OR IMMEDIATELY FOLLOWING A MEAL 180 tablet 1   tamsulosin (FLOMAX) 0.4 MG CAPS capsule Take 1 capsule (0.4 mg total) by mouth daily. 30 capsule 0   Vitamin D, Ergocalciferol, (DRISDOL) 1.25 MG (50000 UNIT) CAPS capsule Take 1 capsule (50,000 Units total) by mouth every 7 (seven) days. 4 capsule 4   No facility-administered medications prior to visit.   Allergies  Allergen Reactions   Losartan Other (See Comments)    Diarrhea and abodminal cramping   Oxycodone Itching   ROS    Objective:    Physical Exam  There were no vitals taken for this visit. Wt Readings from Last 3 Encounters:  11/29/21 224 lb 12.8 oz (102 kg)  08/30/21 216 lb 6.4 oz (98.2 kg)  08/14/21 214 lb 12.8 oz (97.4 kg)   Diabetic Foot Exam - Simple   No data filed    Lab Results  Component Value Date   WBC 5.9 09/01/2021   HGB 12.8 09/01/2021   HCT 38.3 09/01/2021   PLT 257 09/01/2021   GLUCOSE 100 (H) 09/01/2021   CHOL 194 08/14/2021   TRIG 99.0 08/14/2021   HDL 88.30 08/14/2021   LDLCALC 86 08/14/2021   ALT 21 09/01/2021   AST 19 09/01/2021   NA 139 09/01/2021   K 3.7 09/01/2021   CL 106 09/01/2021   CREATININE 0.62 09/01/2021   BUN 16 09/01/2021   CO2 25 09/01/2021   TSH 2.65 08/14/2021   HGBA1C 5.7 08/14/2021   Lab Results  Component Value Date   TSH 2.65 08/14/2021   Lab Results  Component Value Date   WBC 5.9 09/01/2021   HGB 12.8 09/01/2021   HCT 38.3  09/01/2021   MCV 85.5 09/01/2021   PLT 257 09/01/2021   Lab Results  Component Value Date   NA 139 09/01/2021   K 3.7 09/01/2021   CO2 25 09/01/2021   GLUCOSE 100 (H) 09/01/2021   BUN 16 09/01/2021   CREATININE 0.62 09/01/2021   BILITOT 0.5 09/01/2021   ALKPHOS 55 09/01/2021   AST 19 09/01/2021   ALT 21 09/01/2021   PROT 7.4 09/01/2021   ALBUMIN 4.0 09/01/2021   CALCIUM 9.1 09/01/2021   ANIONGAP 8 09/01/2021   GFR 103.48 08/14/2021   Lab Results  Component Value Date   CHOL 194 08/14/2021   Lab Results  Component Value Date   HDL 88.30 08/14/2021   Lab Results  Component Value Date   LDLCALC 86 08/14/2021   Lab Results  Component Value Date   TRIG 99.0 08/14/2021   Lab Results  Component Value Date   CHOLHDL 2 08/14/2021   Lab Results  Component Value Date   HGBA1C 5.7 08/14/2021      Assessment & Plan:   Problem List Items Addressed This Visit     Anxiety and depression - Primary    Stable on Duloxetine, no changes      Hypertension    Monitor at home and report any concerns, no changes to meds. Encouraged heart healthy diet such as the DASH diet and exercise as tolerated.       Vitamin D deficiency    Supplement and monitor      Vitamin B12 deficiency  Supplement and monitor      Hyperlipidemia associated with type 2 diabetes mellitus ([place])    Tolerating statin, encouraged heart healthy diet, avoid trans fats, minimize simple carbs and saturated fats. Increase exercise as tolerated. She did not tolerate Trulicity due to nausea. Is willing to try oral Rybelsus. 3 mg tabs daily and she tolerates with increase to 7 mg in a month      Relevant Medications   Semaglutide (RYBELSUS) 3 MG TABS   Meds ordered this encounter  Medications   Semaglutide (RYBELSUS) 3 MG TABS    Sig: Take 3 mg by mouth daily.    Dispense:  30 tablet    Refill:  1   I discussed the assessment and treatment plan with the patient. The patient was provided an  opportunity to ask questions and all were answered. The patient agreed with the plan and demonstrated an understanding of the instructions.   The patient was advised to call back or seek an in-person evaluation if the symptoms worsen or if the condition fails to improve as anticipated.   I,Mohammed Iqbal,acting as a scribe for Penni Homans, MD.,have documented all relevant documentation on the behalf of Penni Homans, MD,as directed by  Penni Homans, MD while in the presence of Penni Homans, MD.  Penni Homans, MD [facility] at [facility] [phone] (phone) [phone] (fax)  [place]

## 2021-12-20 NOTE — Assessment & Plan Note (Addendum)
Tolerating statin, encouraged heart healthy diet, avoid trans fats, minimize simple carbs and saturated fats. Increase exercise as tolerated. She did not tolerate Trulicity due to nausea. Is willing to try oral Rybelsus. 3 mg tabs daily and she tolerates with increase to 7 mg in a month

## 2021-12-20 NOTE — Assessment & Plan Note (Addendum)
Monitor at home and report any concerns, no changes to meds. Encouraged heart healthy diet such as the DASH diet and exercise as tolerated.  

## 2021-12-20 NOTE — Assessment & Plan Note (Signed)
Supplement and monitor 

## 2021-12-20 NOTE — Assessment & Plan Note (Signed)
Stable on Duloxetine, no changes

## 2022-01-04 ENCOUNTER — Telehealth: Payer: Self-pay | Admitting: Family Medicine

## 2022-01-04 NOTE — Telephone Encounter (Signed)
Patient had virtual visit 12/19/2021 and was waiting on a call to be scheduled for labs. Please place orders for lab and advise when ready to schedule.

## 2022-01-05 ENCOUNTER — Other Ambulatory Visit: Payer: Self-pay

## 2022-01-05 DIAGNOSIS — E785 Hyperlipidemia, unspecified: Secondary | ICD-10-CM

## 2022-01-05 DIAGNOSIS — E559 Vitamin D deficiency, unspecified: Secondary | ICD-10-CM

## 2022-01-05 DIAGNOSIS — E538 Deficiency of other specified B group vitamins: Secondary | ICD-10-CM

## 2022-01-05 DIAGNOSIS — I10 Essential (primary) hypertension: Secondary | ICD-10-CM

## 2022-01-05 NOTE — Telephone Encounter (Signed)
Labs placed and lvm to call to schedule lab appt

## 2022-01-08 NOTE — Telephone Encounter (Signed)
Called pt and lab appt was made

## 2022-01-11 ENCOUNTER — Other Ambulatory Visit: Payer: Federal, State, Local not specified - PPO

## 2022-01-18 ENCOUNTER — Ambulatory Visit: Payer: Federal, State, Local not specified - PPO

## 2022-01-18 DIAGNOSIS — G4733 Obstructive sleep apnea (adult) (pediatric): Secondary | ICD-10-CM

## 2022-01-23 ENCOUNTER — Telehealth: Payer: Self-pay | Admitting: Pulmonary Disease

## 2022-01-23 DIAGNOSIS — L578 Other skin changes due to chronic exposure to nonionizing radiation: Secondary | ICD-10-CM | POA: Diagnosis not present

## 2022-01-23 DIAGNOSIS — L570 Actinic keratosis: Secondary | ICD-10-CM | POA: Diagnosis not present

## 2022-01-23 DIAGNOSIS — G4733 Obstructive sleep apnea (adult) (pediatric): Secondary | ICD-10-CM | POA: Diagnosis not present

## 2022-01-23 DIAGNOSIS — D225 Melanocytic nevi of trunk: Secondary | ICD-10-CM | POA: Diagnosis not present

## 2022-01-23 NOTE — Telephone Encounter (Signed)
HST 01/18/22 >> AHI 37.5, SpO2 low 75%  Please inform her that her sleep study shows severe obstructive sleep apnea.  Please arrange for ROV with me or NP to discuss treatment options.

## 2022-01-23 NOTE — Telephone Encounter (Signed)
Spoke with patient regarding hst results. They verbalized understanding. Scheduled for an appt next week with [facility] NP to go over treatment options. No further questions.  Nothing further needed at this time.

## 2022-01-23 NOTE — Telephone Encounter (Signed)
ATC patient.  LMTCB. 

## 2022-01-31 ENCOUNTER — Ambulatory Visit: Payer: Federal, State, Local not specified - PPO | Admitting: Nurse Practitioner

## 2022-02-06 ENCOUNTER — Ambulatory Visit: Payer: Federal, State, Local not specified - PPO | Admitting: Nurse Practitioner

## 2022-02-06 ENCOUNTER — Encounter: Payer: Self-pay | Admitting: Nurse Practitioner

## 2022-02-06 VITALS — BP 120/80 | HR 83 | Ht 63.0 in | Wt 229.4 lb

## 2022-02-06 DIAGNOSIS — Z6841 Body Mass Index (BMI) 40.0 and over, adult: Secondary | ICD-10-CM | POA: Diagnosis not present

## 2022-02-06 DIAGNOSIS — G4733 Obstructive sleep apnea (adult) (pediatric): Secondary | ICD-10-CM | POA: Diagnosis not present

## 2022-02-06 NOTE — Assessment & Plan Note (Signed)
She has severe OSA with AHI 37.5. We reviewed risks of untreated OSA and treatment options. She would like to move forward with CPAP therapy. Orders sent for new start.   Patient Instructions  Start CPAP auto 5-15 cmH2O, mask of choice, heated humidification, every night, minimum of 4-6 hours a night.  Change equipment every 30 days or as directed by DME. Wash your tubing with warm soap and water daily, hang to dry. Wash humidifier portion weekly.  Be aware of reduced alertness and do not drive or operate heavy machinery if experiencing this or drowsiness.  Healthy weight management discussed.  Avoid or decrease alcohol consumption and medications that make you more sleepy, if possible.  We discussed how untreated sleep apnea puts an individual at risk for cardiac arrhthymias, pulm HTN, DM, stroke and increases their risk for daytime accidents. We also briefly reviewed treatment options including weight loss, side sleeping position, oral appliance, CPAP therapy or referral to ENT for possible surgical options  Follow up in 8-12 weeks with Dr. Halford Chessman or Alanson Aly or sooner if needed

## 2022-02-06 NOTE — Progress Notes (Signed)
Reviewed and agree with assessment/plan.   Chesley Mires, MD [facility] Pulmonary/Critical Care 02/06/2022, 12:35 PM Pager:  [phone]

## 2022-02-06 NOTE — Assessment & Plan Note (Addendum)
Morbid obesity with BMI 40.6. Healthy weight loss encouraged. Exercise 150 min/week. Advised her to notify if she would like a referral to medical weight management.

## 2022-02-06 NOTE — Progress Notes (Signed)
$'@Patient'p$  ID: Julie Bishop, female    DOB: Jul 01, 1970, 51 y.o.   MRN: [id number]  Chief Complaint  Patient presents with   Follow-up    Referring provider: [name], MD  HPI: 51 year old female, never smoker followed for severe OSA. She is a patient of Dr. Juanetta Gosling and last seen in office 11/29/2021. Past medical history significant for anxiety, depression, hx of DVT left leg, DM, GERD, HLD, HTN, raynaud syndrome, vit b12 deficiency, vit d deficiency, obesity.   TEST/EVENTS:  09/06/2016 HST: AHI 17.5, SpO2 low 77% 01/18/2022 HST: AHI 37.5, SpO2 low 75%  11/29/2021: OV with Dr. Halford Chessman. Last seen in 2018; weight was 205 lb. Underwent HST which showed moderate OSA. She was set up with auto CPAP but didn't realize she needed follow up. She started gaining weight and mask became uncomfortable so stopped using CPAP. Referred back d/t snoring, witnessed apneas. [place] 6/24. HST ordered for further evaluation.   02/06/2022: Today - follow up Patient presents today for follow up after undergoing home sleep study which revealed severe OSA with AHI 37.5/h. She continues to struggle with daytime fatigue symptoms. Feels like she doesn't sleep well at night. She has been told that she snores and stops breathing. She recently had a coworker pass away due to untreated OSA, which made her want to get back on CPAP. She denies any drowsy driving, morning headaches, or sleep parasomnias/paralysis. She has gained around 20-30 pounds since she was here in 2018. She struggles with stress at work and eats to cope with this.   Allergies  Allergen Reactions   Losartan Other (See Comments)    Diarrhea and abodminal cramping   Oxycodone Itching    Immunization History  Administered Date(s) Administered   Influenza, Quadrivalent, Recombinant, Inj, Pf 04/21/2019   Influenza,inj,Quad PF,6+ Mos 03/03/2020   Influenza-Unspecified 03/28/2021   PFIZER(Purple Top)SARS-COV-2 Vaccination 08/12/2019, 09/02/2019,  04/30/2020   Tdap 11/15/2015    Past Medical History:  Diagnosis Date   Adjustment disorder 01/25/2017   Anemia    Anxiety and depression    Back pain    Cancer ([place]) 2008   skin cancer, BCC   Constipation 11/14/2014   Deep vein blood clot of left lower extremity ([place]) 12/2019   Depression    Diabetes mellitus without complication (HCC)    Generalized anxiety disorder    GERD (gastroesophageal reflux disease)    Headache 08/24/2015   History of cardiovascular stress test    GXT 3/19: No ischemic ECG changes   History of echocardiogram    Echo 2/18:  EF 50-55, GLS -20% (normal), normal wall motion, grade 1 diastolic dysfunction, trivial MR, mild RVE, normal RV SF, trivial TR, PASP 23   Hyperlipidemia    Elevated, but never taken medication   Hypertension    Insomnia 08/24/2015   Obesity 11/14/2014   OSA (obstructive sleep apnea) 09/12/2016   Pain in joint, shoulder region 11/27/2015   Palpitations    Post-menopause 08/23/2015   Raynauds syndrome    Sleep apnea    appt with pulmonary Dr in march 2018   Vitamin B12 deficiency 08/23/2015   Vitamin D deficiency 08/23/2015    Tobacco History: Social History   Tobacco Use  Smoking Status Never  Smokeless Tobacco Never   Counseling given: Not Answered   Outpatient Medications Prior to Visit  Medication Sig Dispense Refill   acetaminophen (TYLENOL) 500 MG tablet Take 1,000 mg by mouth every 6 (six) hours as needed.  atorvastatin (LIPITOR) 10 MG tablet Take 1 tablet (10 mg total) by mouth daily. 90 tablet 3   cholecalciferol (VITAMIN D3) 25 MCG (1000 UNIT) tablet Take 2,000 Units by mouth daily.     DULoxetine (CYMBALTA) 30 MG capsule TAKE ONE CAPSULE BY MOUTH THREE TIMES A DAY 270 capsule 1   Enalapril-hydroCHLOROthiazide 5-12.5 MG tablet TAKE ONE TABLET BY MOUTH DAILY 90 tablet 1   Ferrous Fumarate-Folic Acid 465-0 MG TABS Take 1 tablet by mouth daily. 30 tablet 2   LORazepam (ATIVAN) 0.5 MG tablet Take 1 tablet (0.5 mg  total) by mouth 2 (two) times daily as needed for anxiety. 30 tablet 1   meloxicam (MOBIC) 15 MG tablet Take 1 tablet (15 mg total) by mouth daily. 30 tablet 0   metoprolol succinate (TOPROL-XL) 50 MG 24 hr tablet TAKE ONE TABLET BY MOUTH TWICE A DAY WITH OR IMMEDIATELY FOLLOWING A MEAL 180 tablet 1   Semaglutide (RYBELSUS) 3 MG TABS Take 3 mg by mouth daily. 30 tablet 1   tamsulosin (FLOMAX) 0.4 MG CAPS capsule Take 1 capsule (0.4 mg total) by mouth daily. 30 capsule 0   Vitamin D, Ergocalciferol, (DRISDOL) 1.25 MG (50000 UNIT) CAPS capsule Take 1 capsule (50,000 Units total) by mouth every 7 (seven) days. 4 capsule 4   No facility-administered medications prior to visit.     Review of Systems:   Constitutional: No night sweats, fevers, chills, or lassitude. +excessive daytime fatigue, weight gain  HEENT: No headaches, difficulty swallowing, tooth/dental problems, or sore throat. No sneezing, itching, ear ache, nasal congestion, or post nasal drip CV:  No chest pain, orthopnea, PND, swelling in lower extremities, anasarca, dizziness, palpitations, syncope Resp: +snoring, witness nocturnal apneas. No shortness of breath with exertion or at rest. No excess mucus or change in color of mucus. No productive or non-productive. No hemoptysis. No wheezing.  No chest wall deformity GI:  No heartburn, indigestion, abdominal pain, nausea, vomiting, diarrhea, change in bowel habits, loss of appetite, bloody stools.  Neuro: No dizziness or lightheadedness.  Psych: No depression or anxiety. Mood stable. +sleep disturbance    Physical Exam:  BP 120/80 (BP Location: Right Arm)   Pulse 83   Ht '5\' 3"'$  (1.6 m)   Wt 229 lb 6.4 oz (104.1 kg)   SpO2 96%   BMI 40.64 kg/m   GEN: Pleasant, interactive, well-appearing; morbidly obese; in no acute distress. HEENT:  Normocephalic and atraumatic. PERRLA. Sclera white. Nasal turbinates pink, moist and patent bilaterally. No rhinorrhea present. Oropharynx pink  and moist, without exudate or edema. No lesions, ulcerations, or postnasal drip. Mallampati III NECK:  Supple w/ fair ROM. No JVD present. Normal carotid impulses w/o bruits. Thyroid symmetrical with no goiter or nodules palpated. No lymphadenopathy.   CV: RRR, no m/r/g, no peripheral edema. Pulses intact, +2 bilaterally. No cyanosis, pallor or clubbing. PULMONARY:  Unlabored, regular breathing. Clear bilaterally A&P w/o wheezes/rales/rhonchi. No accessory muscle use. No dullness to percussion. GI: BS present and normoactive. Soft, non-tender to palpation. No organomegaly or masses detected.  MSK: No erythema, warmth or tenderness.  Neuro: A/Ox3. No focal deficits noted.   Skin: Warm, no lesions or rashe Psych: Normal affect and behavior. Judgement and thought content appropriate.     Lab Results:  CBC    Component Value Date/Time   WBC 5.9 09/01/2021 0542   RBC 4.48 09/01/2021 0542   HGB 12.8 09/01/2021 0542   HGB 12.2 01/23/2021 0801   HGB 13.1 07/12/2016 1058  HCT 38.3 09/01/2021 0542   HCT 37.7 07/12/2016 1058   PLT 257 09/01/2021 0542   PLT 249 01/23/2021 0801   MCV 85.5 09/01/2021 0542   MCV 83 07/12/2016 1058   MCH 28.6 09/01/2021 0542   MCHC 33.4 09/01/2021 0542   RDW 13.2 09/01/2021 0542   RDW 14.5 07/12/2016 1058   LYMPHSABS 2.5 08/14/2021 1433   LYMPHSABS 2.1 07/12/2016 1058   MONOABS 0.5 08/14/2021 1433   EOSABS 0.1 08/14/2021 1433   EOSABS 0.2 07/12/2016 1058   BASOSABS 0.1 08/14/2021 1433   BASOSABS 0.0 07/12/2016 1058    BMET    Component Value Date/Time   NA 139 09/01/2021 0542   NA 138 10/22/2017 0935   K 3.7 09/01/2021 0542   CL 106 09/01/2021 0542   CO2 25 09/01/2021 0542   GLUCOSE 100 (H) 09/01/2021 0542   BUN 16 09/01/2021 0542   BUN 15 10/22/2017 0935   CREATININE 0.62 09/01/2021 0542   CREATININE 0.77 01/23/2021 0801   CREATININE 0.63 06/03/2020 0720   CALCIUM 9.1 09/01/2021 0542   GFRNONAA >60 09/01/2021 0542   GFRNONAA >60 01/23/2021  0801   GFRAA >60 01/21/2020 0812    BNP No results found for: "BNP"   Imaging:  No results found.        No data to display          No results found for: "NITRICOXIDE"      Assessment & Plan:   OSA (obstructive sleep apnea) She has severe OSA with AHI 37.5. We reviewed risks of untreated OSA and treatment options. She would like to move forward with CPAP therapy. Orders sent for new start.   Patient Instructions  Start CPAP auto 5-15 cmH2O, mask of choice, heated humidification, every night, minimum of 4-6 hours a night.  Change equipment every 30 days or as directed by DME. Wash your tubing with warm soap and water daily, hang to dry. Wash humidifier portion weekly.  Be aware of reduced alertness and do not drive or operate heavy machinery if experiencing this or drowsiness.  Healthy weight management discussed.  Avoid or decrease alcohol consumption and medications that make you more sleepy, if possible.  We discussed how untreated sleep apnea puts an individual at risk for cardiac arrhthymias, pulm HTN, DM, stroke and increases their risk for daytime accidents. We also briefly reviewed treatment options including weight loss, side sleeping position, oral appliance, CPAP therapy or referral to ENT for possible surgical options  Follow up in 8-12 weeks with Dr. Halford Chessman or Joellen Jersey Anjelica Gorniak,NP or sooner if needed     Morbid obesity with BMI of 40.0-44.9, adult ([place]) Morbid obesity with BMI 40.6. Healthy weight loss encouraged. Exercise 150 min/week. Advised her to notify if she would like a referral to medical weight management.   I spent 35 minutes of dedicated to the care of this patient on the date of this encounter to include pre-visit review of records, face-to-face time with the patient discussing conditions above, post visit ordering of testing, clinical documentation with the electronic health record, making appropriate referrals as documented, and communicating  necessary findings to members of the patients care team.  Clayton Bibles, NP 02/06/2022  Pt aware and understands NP's role.

## 2022-02-06 NOTE — Patient Instructions (Addendum)
Start CPAP auto 5-15 cmH2O, mask of choice, heated humidification, every night, minimum of 4-6 hours a night.  Change equipment every 30 days or as directed by DME. Wash your tubing with warm soap and water daily, hang to dry. Wash humidifier portion weekly.  Be aware of reduced alertness and do not drive or operate heavy machinery if experiencing this or drowsiness.  Healthy weight management discussed.  Avoid or decrease alcohol consumption and medications that make you more sleepy, if possible.  We discussed how untreated sleep apnea puts an individual at risk for cardiac arrhthymias, pulm HTN, DM, stroke and increases their risk for daytime accidents. We also briefly reviewed treatment options including weight loss, side sleeping position, oral appliance, CPAP therapy or referral to ENT for possible surgical options  Follow up in 8-12 weeks with Dr. Halford Chessman or Alanson Aly or sooner if needed

## 2022-02-14 ENCOUNTER — Telehealth: Payer: Self-pay | Admitting: Nurse Practitioner

## 2022-02-14 NOTE — Telephone Encounter (Signed)
Patient would like to speak with the nurse regarding her cpap machine which she said was ordered at her last visit 9/12.  She stated she was to call if she hadn't heard anything in a week.  Please call patient to give her an update.  CB# [phone]

## 2022-02-14 NOTE — Telephone Encounter (Signed)
Called [facility] to figure out the issues. And they are needing copy of sleep study and office notes. Both were printed and faxed. Nothing further needed

## 2022-03-01 DIAGNOSIS — G4733 Obstructive sleep apnea (adult) (pediatric): Secondary | ICD-10-CM | POA: Diagnosis not present

## 2022-04-01 DIAGNOSIS — G4733 Obstructive sleep apnea (adult) (pediatric): Secondary | ICD-10-CM | POA: Diagnosis not present

## 2022-04-10 ENCOUNTER — Telehealth: Payer: Self-pay | Admitting: Family Medicine

## 2022-04-10 NOTE — Telephone Encounter (Signed)
Medication: LORazepam (ATIVAN) 0.5 MG tablet   Has the patient contacted their pharmacy? No.  Preferred Pharmacy (with phone number or street name):  [facility] [id number], [place], [place] [address] Phone: [phone]  Fax: [phone]

## 2022-04-11 ENCOUNTER — Ambulatory Visit: Payer: Federal, State, Local not specified - PPO | Admitting: Pulmonary Disease

## 2022-04-11 ENCOUNTER — Encounter: Payer: Self-pay | Admitting: Pulmonary Disease

## 2022-04-11 VITALS — BP 142/88 | HR 90 | Temp 98.0°F | Ht 63.0 in | Wt 235.4 lb

## 2022-04-11 DIAGNOSIS — G4733 Obstructive sleep apnea (adult) (pediatric): Secondary | ICD-10-CM

## 2022-04-11 DIAGNOSIS — Z789 Other specified health status: Secondary | ICD-10-CM

## 2022-04-11 NOTE — Patient Instructions (Signed)
Will have [facility] refit your CPAP mask  Follow up in 1 year

## 2022-04-11 NOTE — Progress Notes (Signed)
[facility], Critical Care, and Sleep Medicine  Chief Complaint  Patient presents with   Follow-up    Cpap working well     Past Surgical History:  She  has a past surgical history that includes Abdominal hysterectomy (2010); Ectopic pregnancy surgery; Breast biopsy; Tubal ligation; Shoulder open rotator cuff repair (2017); Tympanostomy tube placement; and Retinal detachment surgery (Right).  Past Medical History:  Anxiety, Depression, Back pain, DVT Lt leg, DM type 2, GERD, HLD, HTN, Raynaud syndrome, Vit B12 deficiency, Vit D deficiency  Constitutional:  BP (!) 142/88   Pulse 90   Temp 98 F (36.7 C)   Ht '5\' 3"'$  (1.6 m)   Wt 235 lb 6.4 oz (106.8 kg)   SpO2 98% Comment: ra  BMI 41.70 kg/m   Brief Summary:  Julie Bishop is a 51 y.o. female with obstructive sleep apnea.      Subjective:   She is here with her husband.  She is doing much better since she started CPAP.  Sleeping better.  She plans to start an diet regimen again.  Has lots of stress from work.  Main concern is mask fit.  She has full face mask, but this doesn't fit well around her chin.  Physical Exam:   Appearance - well kempt   ENMT - no sinus tenderness, no oral exudate, no LAN, Mallampati 3 airway, no stridor  Respiratory - equal breath sounds bilaterally, no wheezing or rales  CV - s1s2 regular rate and rhythm, no murmurs  Ext - no clubbing, no edema  Skin - no rashes  Psych - normal mood and affect   Sleep Tests:  HST 09/06/16 >> AHI 17.5, SaO2 low 77% HST 01/18/22 >> AHI 37.5, SpO2 low 75%  Auto CPAP 03/11/22 to 04/09/22 >> used on 30 of 30 nights with average 7 hrs 41 min.  Average AHI 1.5 with median CPAP 13 and 95 th percentile CPAP 15 cm H2O  Cardiac Tests:  Echo 07/22/19 >> EF 60 to 65%, mild elevation in PASP  Social History:  She  reports that she has never smoked. She has never used smokeless tobacco. She reports that she does not currently use alcohol. She  reports that she does not use drugs.  Family History:  Her family history includes Arthritis in her paternal aunt; Cancer in her maternal grandmother and paternal grandmother; Depression in her mother; Diabetes in her father and mother; Heart attack in her maternal grandmother; Heart attack (age of onset: 64) in her mother; Heart disease in her paternal grandmother; Hyperlipidemia in her mother; Hypertension in her father, mother, and sister; Liver disease in her mother; Obesity in her mother; Sleep apnea in her mother; Vascular Disease in her father.     Assessment/Plan:   Obstructive sleep apnea. - she is compliant with CPAP and reports benefit from therapy - she used [facility] for her DME - current CPAP ordered September 2023 - continue auto CPAP 5 to 15 cm H2O - she is having difficulty with full face mask fit; will have her get a different mask fit  Obesity. - discussed how weight can impact sleep and risk for sleep disordered breathing - discussed options to assist with weight loss: combination of diet modification, cardiovascular and strength training exercises  Time Spent Involved in Patient Care on Day of Examination:  16 minutes  Follow up:   Patient Instructions  Will have [facility] refit your CPAP mask  Follow up in 1 year  Medication List:  Allergies as of 04/11/2022       Reactions   Losartan Other (See Comments)   Diarrhea and abodminal cramping   Oxycodone Itching        Medication List        Accurate as of April 11, 2022  9:45 AM. If you have any questions, ask your nurse or doctor.          acetaminophen 500 MG tablet Commonly known as: TYLENOL Take 1,000 mg by mouth every 6 (six) hours as needed.   atorvastatin 10 MG tablet Commonly known as: LIPITOR Take 1 tablet (10 mg total) by mouth daily.   cholecalciferol 25 MCG (1000 UNIT) tablet Commonly known as: VITAMIN D3 Take 2,000 Units by mouth daily.   DULoxetine 30 MG  capsule Commonly known as: CYMBALTA TAKE ONE CAPSULE BY MOUTH THREE TIMES A DAY   Enalapril-hydroCHLOROthiazide 5-12.5 MG tablet TAKE ONE TABLET BY MOUTH DAILY   Ferrous Fumarate-Folic Acid 517-6 MG Tabs Take 1 tablet by mouth daily.   LORazepam 0.5 MG tablet Commonly known as: ATIVAN Take 1 tablet (0.5 mg total) by mouth 2 (two) times daily as needed for anxiety.   meloxicam 15 MG tablet Commonly known as: MOBIC Take 1 tablet (15 mg total) by mouth daily.   metoprolol succinate 50 MG 24 hr tablet Commonly known as: TOPROL-XL TAKE ONE TABLET BY MOUTH TWICE A DAY WITH OR IMMEDIATELY FOLLOWING A MEAL   Rybelsus 3 MG Tabs Generic drug: Semaglutide Take 3 mg by mouth daily.   tamsulosin 0.4 MG Caps capsule Commonly known as: FLOMAX Take 1 capsule (0.4 mg total) by mouth daily.   Vitamin D (Ergocalciferol) 1.25 MG (50000 UNIT) Caps capsule Commonly known as: DRISDOL Take 1 capsule (50,000 Units total) by mouth every 7 (seven) days.        Signature:  Chesley Mires, MD [facility] Pager - [phone] 04/11/2022, 9:45 AM

## 2022-04-13 ENCOUNTER — Other Ambulatory Visit: Payer: Self-pay

## 2022-04-13 ENCOUNTER — Other Ambulatory Visit: Payer: Self-pay | Admitting: Family

## 2022-04-13 DIAGNOSIS — I10 Essential (primary) hypertension: Secondary | ICD-10-CM

## 2022-04-13 MED ORDER — LORAZEPAM 0.5 MG PO TABS: 0.5000 mg | ORAL_TABLET | Freq: Two times a day (BID) | ORAL | 1 refills | Status: DC | PRN

## 2022-04-13 MED ORDER — METOPROLOL SUCCINATE ER 50 MG PO TB24: ORAL_TABLET | ORAL | 1 refills | Status: DC

## 2022-04-13 NOTE — Telephone Encounter (Signed)
Pt has still not received refills, and would like to know why. Please advise.

## 2022-04-13 NOTE — Telephone Encounter (Signed)
Pt stated she also needed metoprolol succinate (TOPROL-XL) 50 MG 24 hr tablet

## 2022-04-16 NOTE — Progress Notes (Deleted)
Cardiology Office Note:    Date:  04/16/2022   ID:  Julie Bishop, DOB 03/18/1971, MRN [id number]  PCP:  Julie Lukes, Bishop  Julie Bishop:  Julie Meredith Leeds, Bishop Cardiology APP:  Julie Shi, PA-C { Click to update primary Bishop,subspecialty Bishop or APP then REFRESH:1}  *** Referring Bishop: Julie Lukes, Bishop   Chief Complaint:  No chief complaint on file. {Click here for Visit Info    :1}   Patient Profile: Hypertension  Hyperlipidemia Echo 07/22/19: EF 60-65, no RWMA, normal RVSF, RVSP 30.5, trivial MR  ETT 08/01/17: normal  Echo 07/04/16: EF 50-55, GLS -20%, no RWMA, Gr 1 DD, trivial MR, mild RVE, NL R SF, trivial TR, PASP 23  OSA L leg DVT in 2021 Long term anticoagulation; followed by heme/onc GERD  Cardiac Studies & Procedures     STRESS TESTS  EXERCISE TOLERANCE TEST (ETT) 08/02/2017  Narrative  There was no ST segment deviation noted during stress.  Clinically and electrically negative for ischemia.  Blood pressure demonstrated a normal response to exercise.   ECHOCARDIOGRAM  ECHOCARDIOGRAM COMPLETE 07/23/2019  Narrative ECHOCARDIOGRAM REPORT    Patient Name:   Julie Bishop Date of Exam: 07/22/2019 Medical Rec #:  [id number]            Height:       63.0 in Accession #:    [id number]           Weight:       237.0 lb Date of Birth:  07-16-1970           BSA:          2.078 m Patient Age:    63 years             BP:           136/86 mmHg Patient Gender: F                    HR:           69 bpm. Exam Location:  [facility]  Procedure: 2D Echo, Cardiac Doppler, Color Doppler and Strain Analysis  Indications:    Palpitations  History:        Patient has prior history of Echocardiogram examinations, most recent 07/04/2016.  Sonographer:    Julie Bishop RDCS (AE) Referring Phys: Julie Bishop   1. Left ventricular ejection fraction, by estimation, is 60 to 65%. The left ventricle has  normal function. The left ventricle has no regional wall motion abnormalities. Left ventricular diastolic parameters were normal. 2. Right ventricular systolic function is normal. The right ventricular size is normal. There is mildly elevated pulmonary artery systolic pressure. 3. The mitral valve is normal in structure and function. Trivial mitral valve regurgitation. No evidence of mitral stenosis. 4. The aortic valve is normal in structure and function. Aortic valve regurgitation is not visualized. No aortic stenosis is present. 5. The inferior vena cava is normal in size with greater than 50% respiratory variability, suggesting right atrial pressure of 3 mmHg.  FINDINGS Left Ventricle: Left ventricular ejection fraction, by estimation, is 60 to 65%. The left ventricle has normal function. The left ventricle has no regional wall motion abnormalities. The left ventricular internal cavity size was normal in size. There is no left ventricular hypertrophy. Left ventricular diastolic parameters were normal.  Right Ventricle: The right ventricular size is normal. No increase in right ventricular wall thickness.  Right ventricular systolic function is normal. There is mildly elevated pulmonary artery systolic pressure. The tricuspid regurgitant velocity is 2.62 m/s, and with an assumed right atrial pressure of 3 mmHg, the estimated right ventricular systolic pressure is 62.7 mmHg.  Left Atrium: Left atrial size was normal in size.  Right Atrium: Right atrial size was normal in size.  Pericardium: There is no evidence of pericardial effusion.  Mitral Valve: The mitral valve is normal in structure and function. Normal mobility of the mitral valve leaflets. Trivial mitral valve regurgitation. No evidence of mitral valve stenosis.  Tricuspid Valve: The tricuspid valve is normal in structure. Tricuspid valve regurgitation is trivial. No evidence of tricuspid stenosis.  Aortic Valve: The aortic valve is  normal in structure and function. Aortic valve regurgitation is not visualized. No aortic stenosis is present.  Pulmonic Valve: The pulmonic valve was normal in structure. Pulmonic valve regurgitation is not visualized. No evidence of pulmonic stenosis.  Aorta: The aortic root is normal in size and structure.  Venous: The inferior vena cava is normal in size with greater than 50% respiratory variability, suggesting right atrial pressure of 3 mmHg.  IAS/Shunts: No atrial level shunt detected by color flow Doppler.   LEFT VENTRICLE PLAX 2D LVIDd:         4.69 cm  Diastology LVIDs:         2.77 cm  LV e' lateral:   10.90 cm/s LV PW:         1.05 cm  LV E/e' lateral: 8.7 LV IVS:        1.06 cm  LV e' medial:    11.40 cm/s LVOT diam:     2.10 cm  LV E/e' medial:  8.3 LV SV:         81 LV SV Index:   39 LVOT Area:     3.46 cm   RIGHT VENTRICLE             IVC RV Basal diam:  3.40 cm     IVC diam: 1.91 cm RV S prime:     10.10 cm/s TAPSE (M-mode): 2.9 cm  LEFT ATRIUM             Index       RIGHT ATRIUM           Index LA diam:        4.20 cm 2.02 cm/m  RA Area:     15.20 cm LA Vol (A2C):   40.9 ml 19.68 ml/m RA Volume:   38.90 ml  18.72 ml/m LA Vol (A4C):   35.6 ml 17.13 ml/m LA Biplane Vol: 39.7 ml 19.10 ml/m AORTIC VALVE LVOT Vmax:   106.00 cm/s LVOT Vmean:  69.100 cm/s LVOT VTI:    0.234 m  AORTA Ao Root diam: 2.60 cm Ao Asc diam:  3.30 cm  MITRAL VALVE               TRICUSPID VALVE MV Area (PHT): 3.06 cm    TR Peak grad:   27.5 mmHg MV Decel Time: 248 msec    TR Vmax:        262.00 cm/s MV E velocity: 95.10 cm/s MV A velocity: 82.90 cm/s  SHUNTS MV E/A ratio:  1.15        Systemic VTI:  0.23 m Systemic Diam: 2.10 cm  Julie Bishop Electronically signed by Julie Bishop Signature Date/Time: 07/23/2019/6:40:00 PM    Final  History of Present Illness:   Julie Bishop is a 51 y.o. female with the above problem list.  She was  last seen 12/28/20. She returns for f/u. ***         Past Medical History:  Diagnosis Date   Adjustment disorder 01/25/2017   Anemia    Anxiety and depression    Back pain    Cancer (Julie Bishop) 2008   skin cancer, BCC   Constipation 11/14/2014   Deep vein blood clot of left lower extremity (Julie Bishop) 12/2019   Depression    Diabetes mellitus without complication (Julie Bishop)    Generalized anxiety disorder    GERD (gastroesophageal reflux disease)    Headache 08/24/2015   History of cardiovascular stress test    GXT 3/19: No ischemic ECG changes   History of echocardiogram    Echo 2/18:  EF 50-55, GLS -20% (normal), normal wall motion, grade 1 diastolic dysfunction, trivial MR, mild RVE, normal RV SF, trivial TR, PASP 23   Hyperlipidemia    Elevated, but never taken medication   Hypertension    Insomnia 08/24/2015   Obesity 11/14/2014   OSA (obstructive sleep apnea) 09/12/2016   Pain in joint, shoulder region 11/27/2015   Palpitations    Post-menopause 08/23/2015   Raynauds syndrome    Sleep apnea    appt with pulmonary Dr in march 2018   Vitamin B12 deficiency 08/23/2015   Vitamin D deficiency 08/23/2015   Current Medications: No outpatient medications have been marked as taking for the 04/17/22 encounter (Appointment) with Richardson Dopp T, PA-C.    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  ROS   EKGs/Labs/Other Test Reviewed:    EKG:  EKG is *** ordered today.  The ekg ordered today demonstrates ***   Recent Labs: 08/14/2021: TSH 2.65 09/01/2021: ALT 21; BUN 16; Creatinine, Ser 0.62; Hemoglobin 12.8; Platelets 257; Potassium 3.7; Sodium 139   Recent Lipid Panel Recent Labs    08/14/21 1433  CHOL 194  TRIG 99.0  HDL 88.30  VLDL 19.8  LDLCALC 86      Risk Assessment/Calculations/Metrics:   {Does this patient have ATRIAL FIBRILLATION?:[id number]}     No BP recorded.  {Refresh Note OR Click here to enter BP  :1}***    Physical Exam:    VS:  There were no vitals taken for this visit.    Wt Readings from Last 3 Encounters:  04/11/22 235 lb 6.4 oz (106.8 kg)  02/06/22 229 lb 6.4 oz (104.1 kg)  11/29/21 224 lb 12.8 oz (102 kg)    Physical Exam ***     ASSESSMENT & PLAN:   No problem-specific Assessment & Plan notes found for this encounter.   ***1. Essential hypertension BP is much better controlled.  She remains on Enalapril/HCTZ, Metoprolol Succinate.  She has been doing a great job losing weight.  At some point, we may be able to reduce/eliminate some of her medications.     2. Mixed hyperlipidemia Excellent control on Atorvastatin 10 mg once daily.    3. History of DVT (deep vein thrombosis) DVT occurred after emergent ocular surgery for detached retina.  Managed by hematology.  She notes that she Julie need to be on Apixaban for 2 years.***      {Are you ordering a CV Procedure (e.g. stress test, cath, DCCV, TEE, etc)?   Press F2        :[id number]}   Dispo:  No follow-ups on file.   Medication  Adjustments/Labs and Tests Ordered: Current medicines are reviewed at length with the patient today.  Concerns regarding medicines are outlined above.  Tests Ordered: No orders of the defined types were placed in this encounter.  Medication Changes: No orders of the defined types were placed in this encounter.  Signed, Richardson Dopp, PA-C  04/16/2022 10:48 PM    [address], [place],   [address] Phone: [phone]; Fax: [phone]

## 2022-04-16 NOTE — Telephone Encounter (Signed)
Medication sent.

## 2022-04-17 ENCOUNTER — Ambulatory Visit: Payer: Federal, State, Local not specified - PPO | Admitting: Physician Assistant

## 2022-04-17 DIAGNOSIS — I10 Essential (primary) hypertension: Secondary | ICD-10-CM

## 2022-05-01 DIAGNOSIS — G4733 Obstructive sleep apnea (adult) (pediatric): Secondary | ICD-10-CM | POA: Diagnosis not present

## 2022-06-01 ENCOUNTER — Other Ambulatory Visit: Payer: Self-pay | Admitting: Family Medicine

## 2022-06-01 DIAGNOSIS — I10 Essential (primary) hypertension: Secondary | ICD-10-CM

## 2022-06-01 DIAGNOSIS — G4733 Obstructive sleep apnea (adult) (pediatric): Secondary | ICD-10-CM | POA: Diagnosis not present

## 2022-06-20 DIAGNOSIS — G4733 Obstructive sleep apnea (adult) (pediatric): Secondary | ICD-10-CM | POA: Diagnosis not present

## 2022-06-24 NOTE — Assessment & Plan Note (Deleted)
Supplement and monitor 

## 2022-06-24 NOTE — Assessment & Plan Note (Deleted)
Tolerating statin, encouraged heart healthy diet, avoid trans fats, minimize simple carbs and saturated fats. Increase exercise as tolerated. She did not tolerate Trulicity due to nausea. Is willing to try oral Rybelsus. 3 mg tabs daily but her insurance did not work with her

## 2022-06-24 NOTE — Assessment & Plan Note (Deleted)
Monitor at home and report any concerns, no changes to meds. Encouraged heart healthy diet such as the DASH diet and exercise as tolerated.

## 2022-06-24 NOTE — Assessment & Plan Note (Deleted)
Stable on Duloxetine

## 2022-06-24 NOTE — Assessment & Plan Note (Deleted)
Encouraged DASH or MIND diet, decrease po intake and increase exercise as tolerated. Needs 7-8 hours of sleep nightly. Avoid trans fats, eat small, frequent meals every 4-5 hours with lean proteins, complex carbs and healthy fats. Minimize simple carbs, high fat foods and processed foods 

## 2022-06-25 ENCOUNTER — Ambulatory Visit: Payer: Federal, State, Local not specified - PPO | Admitting: Family Medicine

## 2022-06-25 DIAGNOSIS — E538 Deficiency of other specified B group vitamins: Secondary | ICD-10-CM

## 2022-06-25 DIAGNOSIS — F419 Anxiety disorder, unspecified: Secondary | ICD-10-CM

## 2022-06-25 DIAGNOSIS — E559 Vitamin D deficiency, unspecified: Secondary | ICD-10-CM

## 2022-06-25 DIAGNOSIS — I10 Essential (primary) hypertension: Secondary | ICD-10-CM

## 2022-06-25 DIAGNOSIS — E1169 Type 2 diabetes mellitus with other specified complication: Secondary | ICD-10-CM

## 2022-07-16 IMAGING — MG DIGITAL SCREENING BILAT W/ TOMO W/ CAD
6 of 10 series · 6 of 30 positions shown · non-contrast
Comparison: Previous exam(s).

ACR Breast Density Category a: The breast tissue is almost entirely
fatty.

CLINICAL DATA: Screening.

EXAM:
DIGITAL SCREENING BILATERAL MAMMOGRAM WITH TOMO AND CAD

[L CC synth-2D]
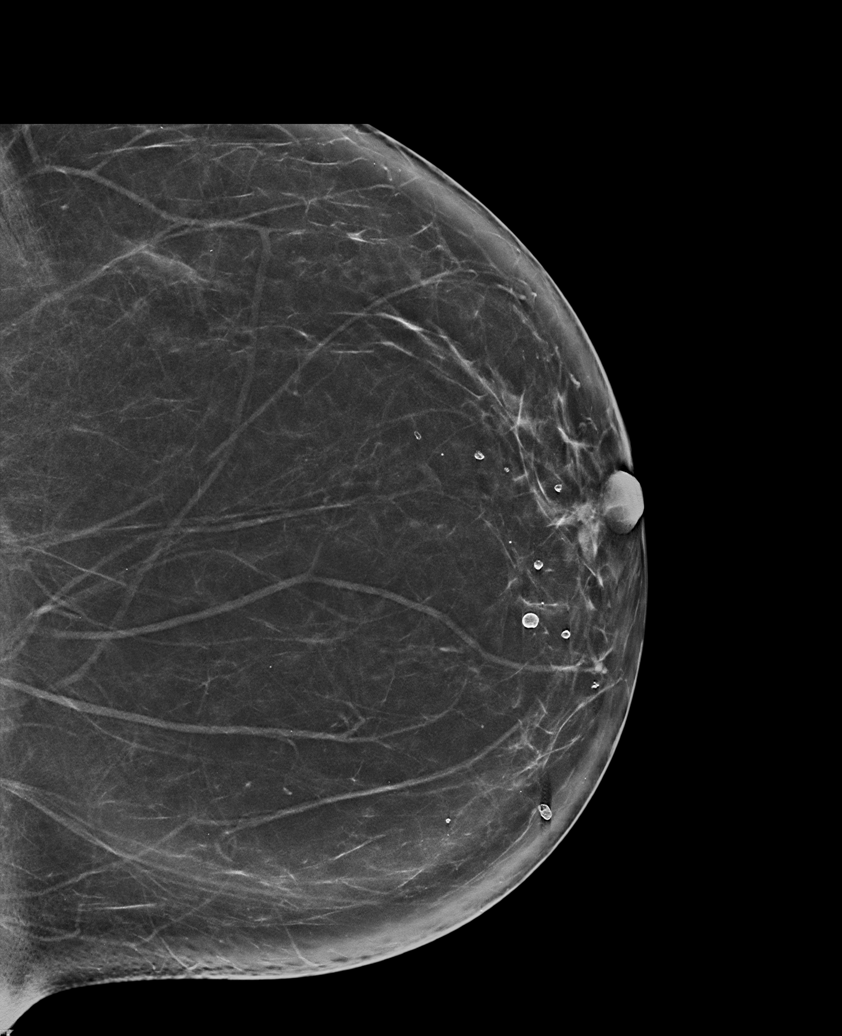

[R CC synth-2D]
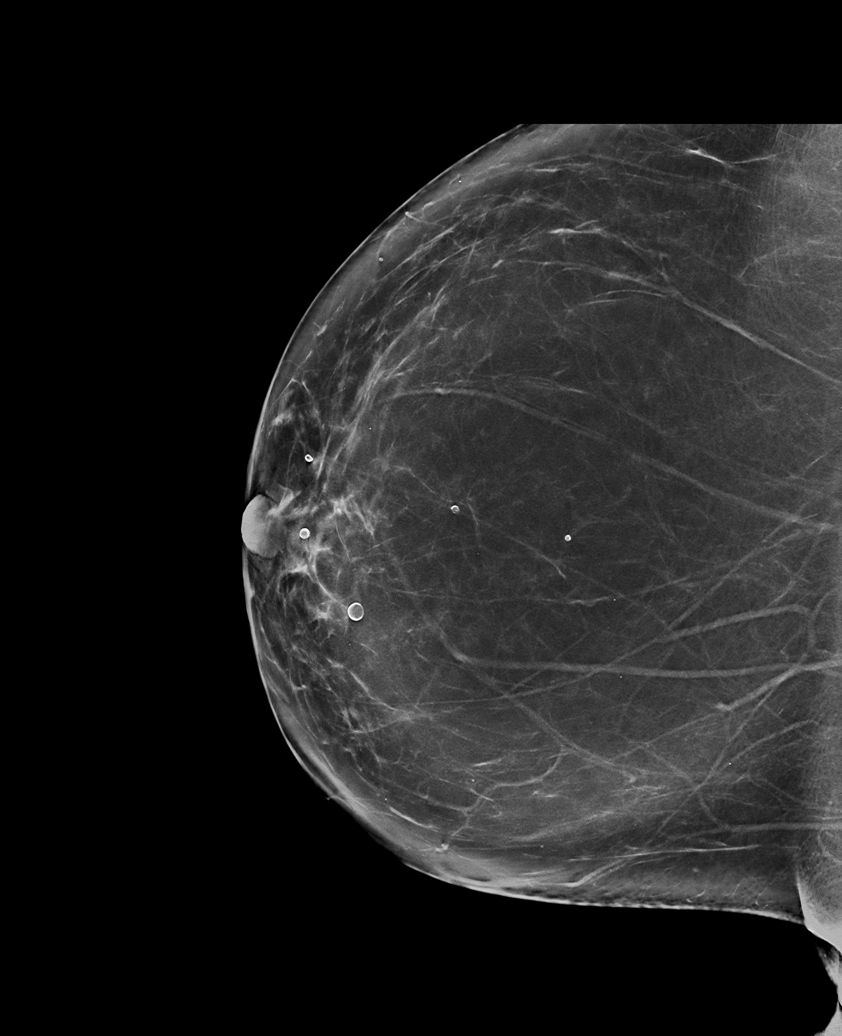

[R MLO synth-2D]
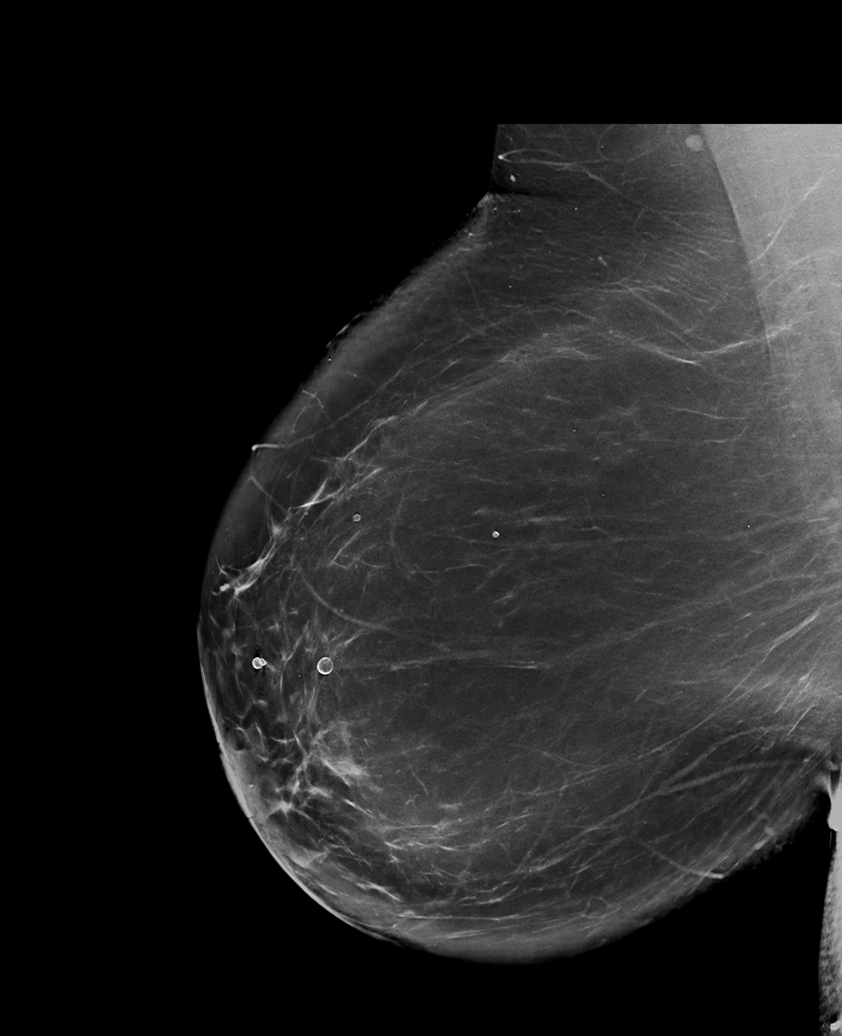

[L MLO synth-2D (1 of 2)]
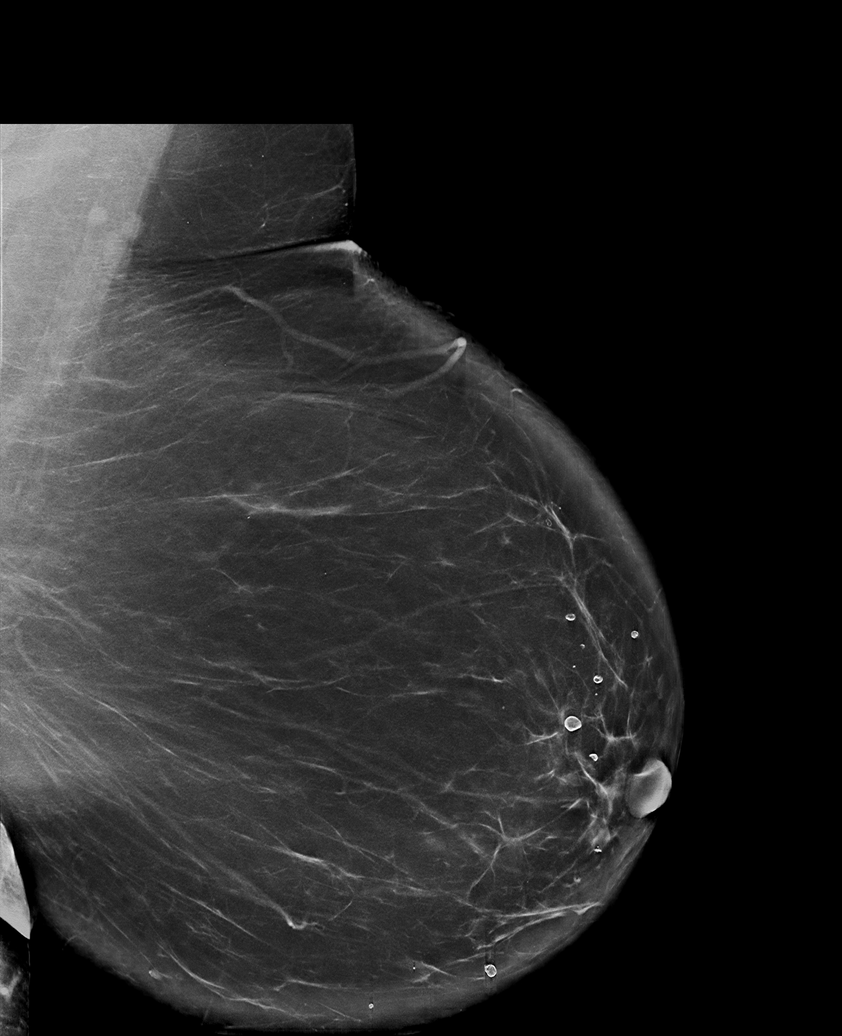

[L MLO synth-2D (2 of 2)]
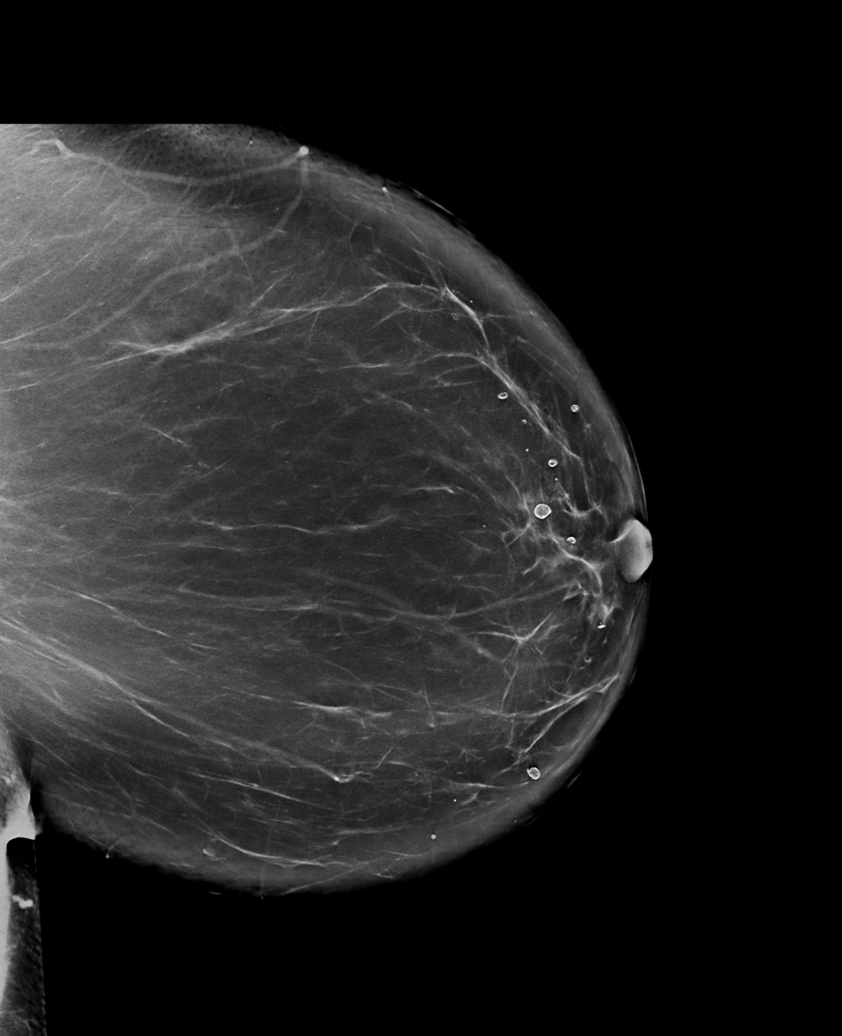

[L MLO tomo · tomo slice 55/110.0]
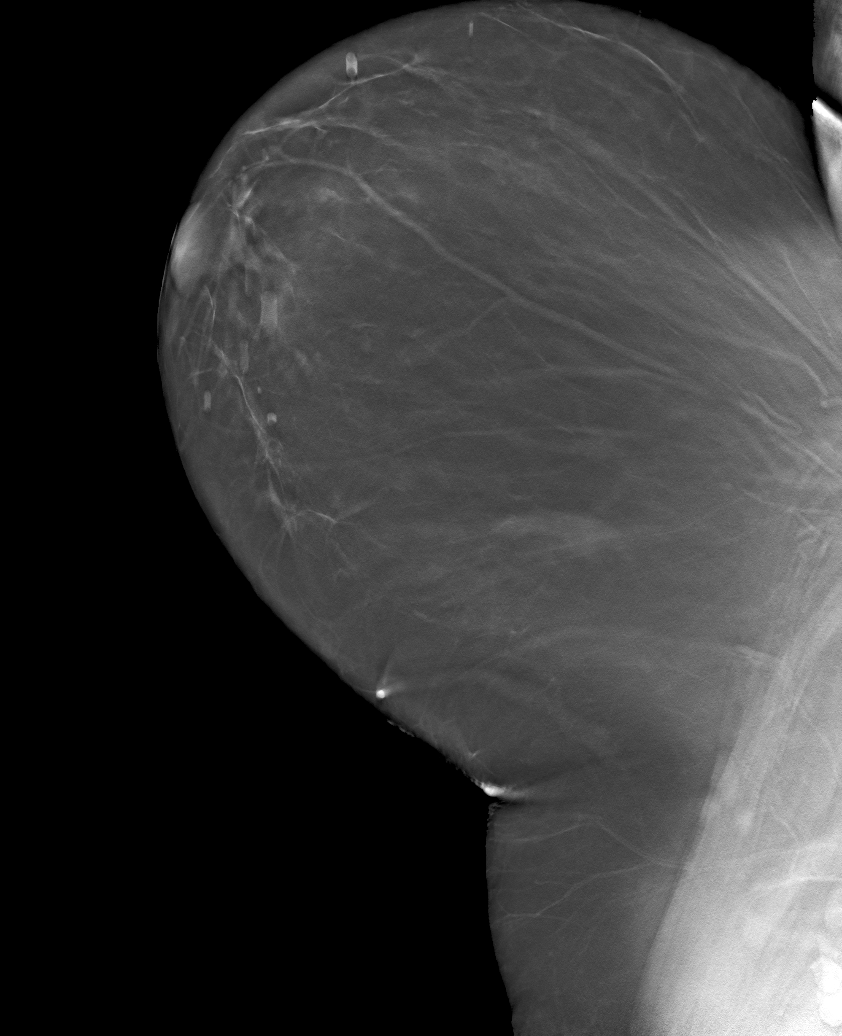

[6 of 30 positions shown; findings below may reference images not displayed]

FINDINGS: There are no findings suspicious for malignancy. Images were
processed with CAD.
IMPRESSION: No mammographic evidence of malignancy. A result letter of this
screening mammogram will be mailed directly to the patient.

RECOMMENDATION:
Screening mammogram in one year. (Code:[id number])

BI-RADS CATEGORY  1: Negative.

## 2022-08-25 ENCOUNTER — Ambulatory Visit
Admission: EM | Admit: 2022-08-25 | Discharge: 2022-08-25 | Disposition: A | Discharge status: Home / Self Care | Payer: Federal, State, Local not specified - PPO

## 2022-08-25 DIAGNOSIS — J069 Acute upper respiratory infection, unspecified: Secondary | ICD-10-CM | POA: Diagnosis not present

## 2022-08-25 NOTE — Discharge Instructions (Signed)
Recommending use of Mucinex (guaifenesin) to assist in expectorating sputum from your airways.  Follow up here or with your primary care provider if your symptoms are worsening or not improving.

## 2022-08-25 NOTE — ED Triage Notes (Signed)
Patient presents to UC for cough, wheezing since weds., Pt states she is concerned she aspirated on a tator tot. Not taking any OTC meds.

## 2022-08-25 NOTE — ED Provider Notes (Signed)
[facility]    CSN: [id number] Arrival date & time: 08/25/22  1309      History   Chief Complaint Chief Complaint  Patient presents with   Wheezing    Coughing chest congestion - Entered by patient   Cough    HPI Julie Bishop is a 52 y.o. female.    Wheezing Associated symptoms: cough   Cough Associated symptoms: wheezing     Presents to urgent care with cough and wheezing x 5 days.  Patient states symptoms occurred after "choking" while eating a "tater tot".  She is concerned for possible aspiration.  She states the choking episode was about a week or 2 prior to the onset of her current symptoms.  Her biggest concern is the feeling of mucus or fluid in her chest that she constantly has the need to cough out.  Past Medical History:  Diagnosis Date   Adjustment disorder 01/25/2017   Anemia    Anxiety and depression    Back pain    Cancer ([place]) 2008   skin cancer, BCC   Constipation 11/14/2014   Deep vein blood clot of left lower extremity ([place]) 12/2019   Depression    Diabetes mellitus without complication ([facility])    Generalized anxiety disorder    GERD (gastroesophageal reflux disease)    Headache 08/24/2015   History of cardiovascular stress test    GXT 3/19: No ischemic ECG changes   History of echocardiogram    Echo 2/18:  EF 50-55, GLS -20% (normal), normal wall motion, grade 1 diastolic dysfunction, trivial MR, mild RVE, normal RV SF, trivial TR, PASP 23   Hyperlipidemia    Elevated, but never taken medication   Hypertension    Insomnia 08/24/2015   Obesity 11/14/2014   OSA (obstructive sleep apnea) 09/12/2016   Pain in joint, shoulder region 11/27/2015   Palpitations    Post-menopause 08/23/2015   Raynauds syndrome    Sleep apnea    appt with pulmonary Dr in march 2018   Vitamin B12 deficiency 08/23/2015   Vitamin D deficiency 08/23/2015    Patient Active Problem List   Diagnosis Date Noted   DVT (deep venous thrombosis) ([place]) 02/05/2021    COVID-19 02/05/2021   IDA (iron deficiency anemia) 03/23/2020   Urinary incontinence 07/09/2019   Palpitations 07/09/2019   Hip pain, left 07/15/2018   Otitis media of left ear 01/29/2018   Hyperlipidemia associated with type 2 diabetes mellitus ([place]) 05/02/2017   Adjustment disorder 01/25/2017   OSA (obstructive sleep apnea) 09/12/2016   Chest pain 07/01/2016   Headache disorder 04/01/2016   Snoring 04/01/2016   Pain in joint, shoulder region 11/27/2015   Insomnia 08/24/2015   Vitamin D deficiency 08/23/2015   Vitamin B12 deficiency 08/23/2015   Preventative health care 08/23/2015   Post-menopause 08/23/2015   Polydipsia 12/08/2014   Constipation 11/14/2014   Morbid obesity with BMI of 40.0-44.9, adult ([place]) 11/14/2014   Anxiety and depression    Cancer ([place])    GERD (gastroesophageal reflux disease)    Essential hypertension     Past Surgical History:  Procedure Laterality Date   ABDOMINAL HYSTERECTOMY  2010   BREAST BIOPSY     right, 1996   ECTOPIC PREGNANCY SURGERY     RETINAL DETACHMENT SURGERY Right    SHOULDER OPEN ROTATOR CUFF REPAIR  2017   right   TUBAL LIGATION     on right   TYMPANOSTOMY TUBE PLACEMENT      OB History  Gravida  3   Para  1   Term  1   Preterm      AB      Living  1      SAB      IAB      Ectopic      Multiple      Live Births  1            Home Medications    Prior to Admission medications   Medication Sig Start Date End Date Taking? Authorizing Provider  acetaminophen (TYLENOL) 500 MG tablet Take 1,000 mg by mouth every 6 (six) hours as needed.     [provider]  atorvastatin (LIPITOR) 10 MG tablet Take 1 tablet (10 mg total) by mouth daily. 12/28/20 12/28/21  Richardson Dopp T, PA-C  cholecalciferol (VITAMIN D3) 25 MCG (1000 UNIT) tablet Take 2,000 Units by mouth daily.    [provider]  DULoxetine (CYMBALTA) 30 MG capsule TAKE ONE CAPSULE BY MOUTH THREE TIMES A DAY 10/26/21   [name], MD  Enalapril-hydroCHLOROthiazide 5-12.5 MG tablet TAKE ONE TABLET BY MOUTH DAILY 06/01/22   [name], MD  Ferrous Fumarate-Folic Acid 99991111 MG TABS Take 1 tablet by mouth daily. 05/06/20   [name], MD  LORazepam (ATIVAN) 0.5 MG tablet Take 1 tablet (0.5 mg total) by mouth 2 (two) times daily as needed for anxiety. 04/13/22   Kennyth Arnold, FNP  meloxicam (MOBIC) 15 MG tablet Take 1 tablet (15 mg total) by mouth daily. 08/30/21   Terrilyn Saver, NP  metoprolol succinate (TOPROL-XL) 50 MG 24 hr tablet TAKE ONE TABLET BY MOUTH TWICE A DAY WITH OR IMMEDIATELY FOLLOWING A MEAL 04/13/22   [name], MD  Semaglutide (RYBELSUS) 3 MG TABS Take 3 mg by mouth daily. 12/19/21   [name], MD  tamsulosin (FLOMAX) 0.4 MG CAPS capsule Take 1 capsule (0.4 mg total) by mouth daily. 09/05/21   Terrilyn Saver, NP  Vitamin D, Ergocalciferol, (DRISDOL) 1.25 MG (50000 UNIT) CAPS capsule Take 1 capsule (50,000 Units total) by mouth every 7 (seven) days. 08/17/21   [name], MD    Family History Family History  Problem Relation Age of Onset   Diabetes Mother    Depression Mother    Hypertension Mother    Hyperlipidemia Mother    Liver disease Mother    Sleep apnea Mother    Obesity Mother    Heart attack Mother 55       CAD w/MI in mid 65s; s/p PCI   Diabetes Father    Hypertension Father    Vascular Disease Father        subclavian artery obstruction s/p stent   Hypertension Sister        as a teenager   Arthritis Paternal Aunt    Cancer Maternal Grandmother        throat   Heart attack Maternal Grandmother    Cancer Paternal Grandmother        lung   Heart disease Paternal Grandmother     Social History Social History   Tobacco Use   Smoking status: Never   Smokeless tobacco: Never  Vaping Use   Vaping Use: Never used  Substance Use Topics   Alcohol use: Not Currently   Drug use: No     Allergies   Losartan and Oxycodone   Review of  Systems Review of Systems  Respiratory:  Positive for  cough and wheezing.      Physical Exam Triage Vital Signs ED Triage Vitals  Enc Vitals Group     BP 08/25/22 1348 (!) 164/90     Pulse Rate 08/25/22 1348 77     Resp 08/25/22 1348 18     Temp 08/25/22 1348 98.4 F (36.9 C)     Temp Source 08/25/22 1348 Oral     SpO2 08/25/22 1348 100 %     Weight --      Height --      Head Circumference --      Peak Flow --      Pain Score 08/25/22 1347 0     Pain Loc --      Pain Edu? --      Excl. in [place]? --    No data found.  Updated Vital Signs BP (!) 164/90 (BP Location: Left Arm)   Pulse 77   Temp 98.4 F (36.9 C) (Oral)   Resp 18   SpO2 100%   Visual Acuity Right Eye Distance:   Left Eye Distance:   Bilateral Distance:    Right Eye Near:   Left Eye Near:    Bilateral Near:     Physical Exam Vitals reviewed.  Constitutional:      Appearance: Normal appearance.  Cardiovascular:     Rate and Rhythm: Normal rate and regular rhythm.     Pulses: Normal pulses.     Heart sounds: Normal heart sounds.  Pulmonary:     Effort: Pulmonary effort is normal.     Breath sounds: Normal breath sounds.  Skin:    General: Skin is warm and dry.  Neurological:     General: No focal deficit present.     Mental Status: She is alert and oriented to person, place, and time.  Psychiatric:        Mood and Affect: Mood normal.        Behavior: Behavior normal.      UC Treatments / Results  Labs (all labs ordered are listed, but only abnormal results are displayed) Labs Reviewed - No data to display  EKG   Radiology No results found.  Procedures Procedures (including critical care time)  Medications Ordered in UC Medications - No data to display  Initial Impression / Assessment and Plan / UC Course  I have reviewed the triage vital signs and the nursing notes.  Pertinent labs & imaging results that were available during my care of the patient were reviewed by me and  considered in my medical decision making (see chart for details).   Patient is afebrile here without recent antipyretics. Satting well on room air. Overall is well appearing, well hydrated, without respiratory distress. Pulmonary exam is unremarkable.  Lungs CTAB without wheezing, rhonchi, rales.  Patient's symptoms are consistent with an acute viral process resulting in cough and excessive sputum production.  Encouraged the patient to use guaifenesin to assist her body and expectorating the mucus.  Recommended she ensure adequate hydration.  She can use a cough suppressant if she finds that her cough is irritating which she currently states it is not.  Educated the patient on post viral care and expected course of the illness.  Patient acknowledges understanding and agreement with treatment plan.  Final Clinical Impressions(s) / UC Diagnoses   Final diagnoses:  None   Discharge Instructions   None    ED Prescriptions   None    PDMP not reviewed this encounter.  Rose Phi, Ollie 08/25/22 1414

## 2022-08-27 ENCOUNTER — Other Ambulatory Visit (HOSPITAL_BASED_OUTPATIENT_CLINIC_OR_DEPARTMENT_OTHER): Payer: Self-pay | Admitting: Family Medicine

## 2022-08-27 DIAGNOSIS — Z1231 Encounter for screening mammogram for malignant neoplasm of breast: Secondary | ICD-10-CM

## 2022-09-08 DIAGNOSIS — M1712 Unilateral primary osteoarthritis, left knee: Secondary | ICD-10-CM | POA: Diagnosis not present

## 2022-09-13 ENCOUNTER — Encounter (HOSPITAL_BASED_OUTPATIENT_CLINIC_OR_DEPARTMENT_OTHER): Payer: Self-pay

## 2022-09-13 ENCOUNTER — Ambulatory Visit (HOSPITAL_BASED_OUTPATIENT_CLINIC_OR_DEPARTMENT_OTHER)
Admission: RE | Admit: 2022-09-13 | Discharge: 2022-09-13 | Disposition: A | Discharge status: Home / Self Care | Payer: Federal, State, Local not specified - PPO | Source: Ambulatory Visit | Attending: Family Medicine | Admitting: Family Medicine

## 2022-09-13 DIAGNOSIS — Z1231 Encounter for screening mammogram for malignant neoplasm of breast: Secondary | ICD-10-CM | POA: Insufficient documentation

## 2022-10-15 NOTE — Progress Notes (Unsigned)
Cardiology Office Note:    Date:  10/16/2022  ID:  Julie Bishop, DOB 1970-09-15, MRN [id number] PCP: Bradd Canary, MD  [facility] Providers Cardiologist:  Will Jorja Loa, MD Cardiology APP:  Beatrice Lecher, PA-C          Patient Profile:   Hypertension  Hyperlipidemia Diabetes mellitus OSA L leg DVT in 2021 Long term anticoagulation; followed by heme/onc GERD  TTE 07/22/2019: EF 60-65, no RWMA, normal RVSF, RVSP 30.5, trivial MR  ETT 08/01/17: normal  FHx CAD (M - MI in 107s)      History of Present Illness:   Julie Bishop is a 52 y.o. female who returns for follow-up of hypertension.  She was last seen August 2022.  She is here alone.  She notes significant weight gain as well as dyspnea with exertion and heartburn.  Her jaws become tense when she has heartburn.  It typically occurs with emotional stress, but not physical stress.  She has had decreased appetite as well as nausea.  She has not had any vomiting.  She has not had orthopnea.  She uses CPAP at night.  She has some dependent edema on the left that resolves with elevation.  She has not had syncope.  She has a history of palpitations.  She has noted more palpitations recently.  She feels her heart is racing and describes it as being "out of rhythm."  Review of Systems  Cardiovascular:  Negative for claudication.  Gastrointestinal:  Negative for hematochezia and melena.  Genitourinary:  Negative for hematuria.   see HPI    Studies Reviewed:    EKG: NSR, HR 75, normal axis, nonspecific ST-T wave changes, QTc 415   Risk Assessment/Calculations:     HYPERTENSION CONTROL Vitals:   10/16/22 0808 10/16/22 1743  BP: (!) 152/90 (!) 150/110    The patient's blood pressure is elevated above target today.  In order to address the patient's elevated BP: A current anti-hypertensive medication was adjusted today.          Physical Exam:   VS:  BP (!) 150/110   Pulse 75   Ht 5\' 3"  (1.6 m)    Wt 240 lb (108.9 kg)   SpO2 98%   BMI 42.51 kg/m    Wt Readings from Last 3 Encounters:  10/16/22 240 lb (108.9 kg)  04/11/22 235 lb 6.4 oz (106.8 kg)  02/06/22 229 lb 6.4 oz (104.1 kg)    Constitutional:      Appearance: Healthy appearance. Not in distress.  Neck:     Vascular: No carotid bruit. JVD normal.  Pulmonary:     Breath sounds: Normal breath sounds. No wheezing. No rales.  Cardiovascular:     Normal rate. Regular rhythm. Normal S1. Normal S2.      Murmurs: There is no murmur.  Edema:    Peripheral edema absent.  Abdominal:     Palpations: Abdomen is soft.       ASSESSMENT AND PLAN:   Chest pain She notes symptoms of shortness of breath with exertion as well as chest discomfort described as heartburn.  She notices it with mainly emotional stress at work.  She also had a coworker recently that complained of indigestion for several weeks and then passed away from a massive heart attack.  She does have a strong family history of coronary artery disease.  Her mother had a heart attack in her 39s.  She has recently been diagnosed with diabetes.  She has uncontrolled hypertension and a diagnosis of hyperlipidemia.  Her BMI is 42.51.  She is certainly at risk for obstructive coronary artery disease.  I have recommended proceeding with coronary CTA to definitively rule out CAD.  She agrees with this plan. Arrange CCTA with FFR. Arrange echocardiogram. Obtain CMET, lipids, CBC, BNP Nitroglycerin as needed chest pain Start Pepcid OTC 20 mg twice daily Follow-up 4 to 6 weeks  SOB (shortness of breath) She notes shortness of breath with moderate exertion.  She does not really exhibit any signs of heart failure on exam.  She has had significant weight gain.  Obtain BNP as noted.  Arrange echocardiogram.  If BNP significantly elevated, add Lasix to medical regimen.  Follow-up in 4 to 6 weeks.  Essential hypertension Blood pressure uncontrolled.  She has not had medications yet  today.  Continue Toprol-XL 50 mg twice daily.  Increase enalapril/HCTZ to 10/25 mg daily.  Obtain BMET in 1 week.  Hyperlipidemia associated with type 2 diabetes mellitus (HCC) With a diagnosis of diabetes, her goal LDL should at least be less than 100, ideally less than 70.  If she has CAD on CCTA, we will need to aim for an LDL of at least less than 70.  Obtain CMET, lipids today.  Palpitations Proceed with workup as noted with CCTA and echocardiogram.  If symptoms continue, consider 14-day ZIO monitor.  Continue Toprol-XL 50 mg twice daily.     Dispo:  Return in about 6 weeks (around 11/27/2022) for Follow up after testing, w/ Tereso Newcomer, PA-C.  Signed, Tereso Newcomer, PA-C

## 2022-10-16 ENCOUNTER — Encounter: Payer: Self-pay | Admitting: Physician Assistant

## 2022-10-16 ENCOUNTER — Ambulatory Visit: Payer: Federal, State, Local not specified - PPO | Attending: Physician Assistant | Admitting: Physician Assistant

## 2022-10-16 VITALS — BP 150/110 | HR 75 | Ht 63.0 in | Wt 240.0 lb

## 2022-10-16 DIAGNOSIS — R072 Precordial pain: Secondary | ICD-10-CM | POA: Diagnosis not present

## 2022-10-16 DIAGNOSIS — E1169 Type 2 diabetes mellitus with other specified complication: Secondary | ICD-10-CM

## 2022-10-16 DIAGNOSIS — I10 Essential (primary) hypertension: Secondary | ICD-10-CM

## 2022-10-16 DIAGNOSIS — R002 Palpitations: Secondary | ICD-10-CM

## 2022-10-16 DIAGNOSIS — R079 Chest pain, unspecified: Secondary | ICD-10-CM | POA: Diagnosis not present

## 2022-10-16 DIAGNOSIS — R0602 Shortness of breath: Secondary | ICD-10-CM

## 2022-10-16 DIAGNOSIS — E785 Hyperlipidemia, unspecified: Secondary | ICD-10-CM

## 2022-10-16 DIAGNOSIS — Z7984 Long term (current) use of oral hypoglycemic drugs: Secondary | ICD-10-CM

## 2022-10-16 MED ORDER — ENALAPRIL-HYDROCHLOROTHIAZIDE 10-25 MG PO TABS: 1.0000 | ORAL_TABLET | Freq: Every day | ORAL | 3 refills | Status: DC

## 2022-10-16 MED ORDER — METOPROLOL TARTRATE 25 MG PO TABS: ORAL_TABLET | ORAL | 0 refills | Status: DC

## 2022-10-16 MED ORDER — NITROGLYCERIN 0.4 MG SL SUBL: 0.4000 mg | SUBLINGUAL_TABLET | SUBLINGUAL | 3 refills | Status: DC | PRN

## 2022-10-16 NOTE — Assessment & Plan Note (Signed)
She notes symptoms of shortness of breath with exertion as well as chest discomfort described as heartburn.  She notices it with mainly emotional stress at work.  She also had a coworker recently that complained of indigestion for several weeks and then passed away from a massive heart attack.  She does have a strong family history of coronary artery disease.  Her mother had a heart attack in her 34s.  She has recently been diagnosed with diabetes.  She has uncontrolled hypertension and a diagnosis of hyperlipidemia.  Her BMI is 42.51.  She is certainly at risk for obstructive coronary artery disease.  I have recommended proceeding with coronary CTA to definitively rule out CAD.  She agrees with this plan. Arrange CCTA with FFR. Arrange echocardiogram. Obtain CMET, lipids, CBC, BNP Nitroglycerin as needed chest pain Start Pepcid OTC 20 mg twice daily Follow-up 4 to 6 weeks

## 2022-10-16 NOTE — Assessment & Plan Note (Signed)
Proceed with workup as noted with CCTA and echocardiogram.  If symptoms continue, consider 14-day ZIO monitor.  Continue Toprol-XL 50 mg twice daily.

## 2022-10-16 NOTE — Patient Instructions (Signed)
Medication Instructions:  Your physician has recommended you make the following change in your medication:   INCREASE the Enalapril-HCTZ to 10-25 taking 1 daily  START Nitroglycerin 0.4 s/l tablet taking only as needed... The proper use and anticipated side effects of nitroglycerine has been carefully explained.  If a single episode of chest pain is not relieved by one tablet, the patient will try another within 5 minutes; and if this doesn't relieve the pain, the patient is instructed to call 911 for transportation to an emergency department. TRY over the counter Pepcid  *If you need a refill on your cardiac medications before your next appointment, please call your pharmacy*   Lab Work: TODAY:  CMET, LIPID, PRO BNP, & CBC  1 WEEK:  BMET  If you have labs (blood work) drawn today and your tests are completely normal, you will receive your results only by: MyChart Message (if you have MyChart) OR A paper copy in the mail If you have any lab test that is abnormal or we need to change your treatment, we will call you to review the results.   Testing/Procedures: Your physician has requested that you have an echocardiogram. Echocardiography is a painless test that uses sound waves to create images of your heart. It provides your doctor with information about the size and shape of your heart and how well your heart's chambers and valves are working. This procedure takes approximately one hour. There are no restrictions for this procedure. Please do NOT wear cologne, perfume, aftershave, or lotions (deodorant is allowed). Please arrive 15 minutes prior to your appointment time.   Your physician has requested that you have cardiac CT. Cardiac computed tomography (CT) is a painless test that uses an x-ray machine to take clear, detailed pictures of your heart. For further information please visit [email or web address] Please follow instruction sheet BELOW:     Your cardiac CT will be  scheduled at one of the below locations:   [facility] [address] [place], [place] [address] [phone]  OR  [facility] [address] [place], [place] [address] [phone]  OR   [facility] [address] [place], [place] [address] [phone]  If scheduled at [facility], please arrive at the [facility] Entrance (Entrance C2) of [facility] 30 minutes prior to test start time. You can use the FREE valet parking offered at entrance C (encouraged to control the heart rate for the test)  Proceed to the [facility] Radiology Department (first floor) to check-in and test prep.  All radiology patients and guests should use entrance C2 at [facility], accessed from [facility], even though the hospital's physical address listed is [address].    If scheduled at [facility] or [facility], please arrive 15 mins early for check-in and test prep.   Please follow these instructions carefully (unless otherwise directed):  Hold all erectile dysfunction medications at least 3 days (72 hrs) prior to test. (Ie viagra, cialis, sildenafil, tadalafil, etc) We will administer nitroglycerin during this exam.   On the Night Before the Test: Be sure to Drink plenty of water. Do not consume any caffeinated/decaffeinated beverages or chocolate 12 hours prior to your test. Do not take any antihistamines 12 hours prior to your test. If the patient has contrast allergy: Patient will need a prescription for Prednisone and very clear instructions (as follows): Prednisone  50 mg - take 13 hours prior to test Take another Prednisone 50 mg 7 hours prior to test Take another Prednisone 50 mg 1 hour prior to test Take Benadryl 50 mg 1 hour prior to test Patient must complete all four doses of above  prophylactic medications. Patient will need a ride after test due to Benadryl.  On the Day of the Test: Drink plenty of water until 1 hour prior to the test. Do not eat any food 1 hour prior to test. You may take your regular medications prior to the test.  Take your regular dose of metoprolol that morning as usual, you will also take a  (Lopressor) 25 mg two hours prior to test. If you take Furosemide/Hydrochlorothiazide/Spironolactone, please HOLD on the morning of the test. FEMALES- please wear underwire-free bra if available, avoid dresses & tight clothing       After the Test: Drink plenty of water. After receiving IV contrast, you may experience a mild flushed feeling. This is normal. On occasion, you may experience a mild rash up to 24 hours after the test. This is not dangerous. If this occurs, you can take Benadryl 25 mg and increase your fluid intake. If you experience trouble breathing, this can be serious. If it is severe call 911 IMMEDIATELY. If it is mild, please call our office.   We will call to schedule your test 2-4 weeks out understanding that some insurance companies will need an authorization prior to the service being performed.   For non-scheduling related questions, please contact the cardiac imaging nurse navigator should you have any questions/concerns: [facility] [place], Cardiac Imaging Nurse Navigator Larey Brick, Cardiac Imaging Nurse Navigator [facility] Direct Office Dial: [phone]   For scheduling needs, including cancellations and rescheduling, please call [place], [phone].   Follow-Up: At [facility], you and your health needs are our priority.  As part of our continuing mission to provide you with exceptional heart care, we have created designated Provider Care Teams.  These Care Teams include your primary Cardiologist (physician) and Advanced Practice Providers (APPs -  Physician Assistants and Nurse  Practitioners) who all work together to provide you with the care you need, when you need it.  We recommend signing up for the patient portal called "MyChart".  Sign up information is provided on this After Visit Summary.  MyChart is used to connect with patients for Virtual Visits (Telemedicine).  Patients are able to view lab/test results, encounter notes, upcoming appointments, etc.  Non-urgent messages can be sent to your provider as well.   To learn more about what you can do with MyChart, go to [email or web address].    Your next appointment:   4-6 week(s)  Provider:   Tereso Newcomer, PA-C         Other Instructions

## 2022-10-16 NOTE — Assessment & Plan Note (Signed)
With a diagnosis of diabetes, her goal LDL should at least be less than 100, ideally less than 70.  If she has CAD on CCTA, we will need to aim for an LDL of at least less than 70.  Obtain CMET, lipids today.

## 2022-10-16 NOTE — Assessment & Plan Note (Signed)
Blood pressure uncontrolled.  She has not had medications yet today.  Continue Toprol-XL 50 mg twice daily.  Increase enalapril/HCTZ to 10/25 mg daily.  Obtain BMET in 1 week.

## 2022-10-16 NOTE — Assessment & Plan Note (Signed)
She notes shortness of breath with moderate exertion.  She does not really exhibit any signs of heart failure on exam.  She has had significant weight gain.  Obtain BNP as noted.  Arrange echocardiogram.  If BNP significantly elevated, add Lasix to medical regimen.  Follow-up in 4 to 6 weeks.

## 2022-10-17 ENCOUNTER — Telehealth: Payer: Self-pay | Admitting: *Deleted

## 2022-10-17 ENCOUNTER — Encounter: Payer: Self-pay | Admitting: Family Medicine

## 2022-10-17 DIAGNOSIS — Z79899 Other long term (current) drug therapy: Secondary | ICD-10-CM

## 2022-10-17 LAB — CBC
Hematocrit: 34.9 % (ref 34.0–46.6)
Hemoglobin: 11.8 g/dL (ref 11.1–15.9)
MCH: 27.7 pg (ref 26.6–33.0)
MCHC: 33.8 g/dL (ref 31.5–35.7)
MCV: 82 fL (ref 79–97)
Platelets: 261 10*3/uL (ref 150–450)
RBC: 4.26 x10E6/uL (ref 3.77–5.28)
RDW: 14.4 % (ref 11.7–15.4)
WBC: 5.1 10*3/uL (ref 3.4–10.8)

## 2022-10-17 LAB — COMPREHENSIVE METABOLIC PANEL
ALT: 43 IU/L — ABNORMAL HIGH (ref 0–32)
AST: 22 IU/L (ref 0–40)
Albumin/Globulin Ratio: 1.7 (ref 1.2–2.2)
Albumin: 4.3 g/dL (ref 3.8–4.9)
Alkaline Phosphatase: 67 IU/L (ref 44–121)
BUN/Creatinine Ratio: 17 (ref 9–23)
BUN: 11 mg/dL (ref 6–24)
Bilirubin Total: 0.3 mg/dL (ref 0.0–1.2)
CO2: 21 mmol/L (ref 20–29)
Calcium: 9.4 mg/dL (ref 8.7–10.2)
Chloride: 103 mmol/L (ref 96–106)
Creatinine, Ser: 0.66 mg/dL (ref 0.57–1.00)
Globulin, Total: 2.5 g/dL (ref 1.5–4.5)
Glucose: 111 mg/dL — ABNORMAL HIGH (ref 70–99)
Potassium: 4.2 mmol/L (ref 3.5–5.2)
Sodium: 139 mmol/L (ref 134–144)
Total Protein: 6.8 g/dL (ref 6.0–8.5)
eGFR: 106 mL/min/{1.73_m2} (ref >59)

## 2022-10-17 LAB — LIPID PANEL
Chol/HDL Ratio: 2.9 ratio (ref 0.0–4.4)
Cholesterol, Total: 218 mg/dL — ABNORMAL HIGH (ref 100–199)
HDL: 76 mg/dL (ref >39)
LDL Chol Calc (NIH): 122 mg/dL — ABNORMAL HIGH (ref 0–99)
Triglycerides: 114 mg/dL (ref 0–149)
VLDL Cholesterol Cal: 20 mg/dL (ref 5–40)

## 2022-10-17 LAB — PRO B NATRIURETIC PEPTIDE: NT-Pro BNP: 55 pg/mL (ref 0–249)

## 2022-10-17 MED ORDER — ROSUVASTATIN CALCIUM 20 MG PO TABS: 20.0000 mg | ORAL_TABLET | Freq: Every day | ORAL | 3 refills | Status: DC

## 2022-10-17 NOTE — Telephone Encounter (Signed)
-----   Message from Beatrice Lecher, [place] sent at 10/17/2022 10:25 AM EDT ----- Results sent to Rinaldo Ratel via MyChart. See MyChart comments below. PLAN:  -Stop atorvastatin -Start rosuvastatin 20 mg daily -LFTs 4 weeks (diagnosis: Elevated LFTs) -Lipids, LFTs 3 months (diagnosis: Hyperlipidemia, elevated LFTs)  Ms. Bex  Your creatinine (kidney function), potassium, hemoglobin (blood count) are normal.  The NT-proBNP (heart failure test) is normal.  This indicates no evidence of fluid excess contributing to shortness of breath.  Your ALT (liver enzyme) is mildly elevated at 43.  Your LDL cholesterol is above goal at 122.  With a diagnosis of diabetes, I would recommend getting this closer to 70 or below.  I will stop your atorvastatin and place you on rosuvastatin (generic for Crestor) 20 mg daily.  I will recheck labs again in 3 months. Tereso Newcomer, PA-C

## 2022-10-24 ENCOUNTER — Ambulatory Visit: Payer: Federal, State, Local not specified - PPO | Attending: Cardiology

## 2022-10-24 DIAGNOSIS — Z79899 Other long term (current) drug therapy: Secondary | ICD-10-CM

## 2022-10-24 DIAGNOSIS — I10 Essential (primary) hypertension: Secondary | ICD-10-CM | POA: Diagnosis not present

## 2022-10-24 DIAGNOSIS — R072 Precordial pain: Secondary | ICD-10-CM

## 2022-10-24 DIAGNOSIS — R0602 Shortness of breath: Secondary | ICD-10-CM | POA: Diagnosis not present

## 2022-10-24 NOTE — Telephone Encounter (Signed)
Reschedule for 10/29/22. Tereso Newcomer, PA-C    10/24/2022 6:00 PM

## 2022-10-25 LAB — HEPATIC FUNCTION PANEL
ALT: 34 IU/L — ABNORMAL HIGH (ref 0–32)
AST: 23 IU/L (ref 0–40)
Albumin: 4.5 g/dL (ref 3.8–4.9)
Alkaline Phosphatase: 67 IU/L (ref 44–121)
Bilirubin Total: 0.3 mg/dL (ref 0.0–1.2)
Bilirubin, Direct: 0.11 mg/dL (ref 0.00–0.40)
Total Protein: 7.1 g/dL (ref 6.0–8.5)

## 2022-10-25 LAB — BASIC METABOLIC PANEL
BUN/Creatinine Ratio: 25 — ABNORMAL HIGH (ref 9–23)
BUN: 20 mg/dL (ref 6–24)
CO2: 25 mmol/L (ref 20–29)
Calcium: 9.9 mg/dL (ref 8.7–10.2)
Chloride: 99 mmol/L (ref 96–106)
Creatinine, Ser: 0.81 mg/dL (ref 0.57–1.00)
Glucose: 101 mg/dL — ABNORMAL HIGH (ref 70–99)
Potassium: 4.1 mmol/L (ref 3.5–5.2)
Sodium: 141 mmol/L (ref 134–144)
eGFR: 88 mL/min/{1.73_m2} (ref >59)

## 2022-10-25 LAB — LIPID PANEL
Chol/HDL Ratio: 2.3 ratio (ref 0.0–4.4)
Cholesterol, Total: 136 mg/dL (ref 100–199)
HDL: 59 mg/dL (ref >39)
LDL Chol Calc (NIH): 49 mg/dL (ref 0–99)
Triglycerides: 171 mg/dL — ABNORMAL HIGH (ref 0–149)
VLDL Cholesterol Cal: 28 mg/dL (ref 5–40)

## 2022-10-26 ENCOUNTER — Telehealth (HOSPITAL_COMMUNITY): Payer: Self-pay | Admitting: Emergency Medicine

## 2022-10-26 NOTE — Telephone Encounter (Signed)
Reaching out to patient to offer assistance regarding upcoming cardiac imaging study; pt verbalizes understanding of appt date/time, parking situation and where to check in, pre-test NPO status and medications ordered, and verified current allergies; name and call back number provided for further questions should they arise Mikiah Durall RN Navigator Cardiac Imaging [facility] [phone] office [phone] cell 

## 2022-10-26 NOTE — Telephone Encounter (Signed)
Spoke with patient to schedule lab work for 11/02/22.

## 2022-10-26 NOTE — Telephone Encounter (Signed)
Arrange repeat BMET next Thursday or Friday. Tereso Newcomer, PA-C    10/26/2022 12:10 PM

## 2022-10-29 ENCOUNTER — Ambulatory Visit (HOSPITAL_COMMUNITY)
Admission: RE | Admit: 2022-10-29 | Discharge: 2022-10-29 | Disposition: A | Discharge status: Home / Self Care | Payer: Federal, State, Local not specified - PPO | Source: Ambulatory Visit | Attending: Physician Assistant | Admitting: Physician Assistant

## 2022-10-29 ENCOUNTER — Encounter: Payer: Self-pay | Admitting: Physician Assistant

## 2022-10-29 DIAGNOSIS — I10 Essential (primary) hypertension: Secondary | ICD-10-CM | POA: Diagnosis not present

## 2022-10-29 DIAGNOSIS — R0602 Shortness of breath: Secondary | ICD-10-CM | POA: Diagnosis not present

## 2022-10-29 DIAGNOSIS — R072 Precordial pain: Secondary | ICD-10-CM | POA: Diagnosis not present

## 2022-10-29 MED ORDER — NITROGLYCERIN 0.4 MG SL SUBL
SUBLINGUAL_TABLET | SUBLINGUAL | Status: AC
  Filled 2022-10-29: qty 2

## 2022-10-29 MED ORDER — IOHEXOL 350 MG/ML SOLN
95.0000 mL | Freq: Once | INTRAVENOUS | Status: AC | PRN
  Administered 2022-10-29: 95 mL via INTRAVENOUS

## 2022-10-29 MED ORDER — NITROGLYCERIN 0.4 MG SL SUBL
0.8000 mg | SUBLINGUAL_TABLET | Freq: Once | SUBLINGUAL | Status: AC
  Administered 2022-10-29: 0.8 mg via SUBLINGUAL

## 2022-10-29 NOTE — Assessment & Plan Note (Signed)
Tolerating statin, encouraged heart healthy diet, avoid trans fats, minimize simple carbs and saturated fats. Increase exercise as tolerated 

## 2022-10-29 NOTE — Assessment & Plan Note (Signed)
Supplement and monitor 

## 2022-10-29 NOTE — Assessment & Plan Note (Signed)
Monitor at home and report any concerns, no changes to meds. Encouraged heart healthy diet such as the DASH diet and exercise as tolerated.  

## 2022-10-30 ENCOUNTER — Telehealth (INDEPENDENT_AMBULATORY_CARE_PROVIDER_SITE_OTHER): Payer: Federal, State, Local not specified - PPO | Admitting: Family Medicine

## 2022-10-30 DIAGNOSIS — E559 Vitamin D deficiency, unspecified: Secondary | ICD-10-CM | POA: Diagnosis not present

## 2022-10-30 DIAGNOSIS — E538 Deficiency of other specified B group vitamins: Secondary | ICD-10-CM | POA: Diagnosis not present

## 2022-10-30 DIAGNOSIS — I10 Essential (primary) hypertension: Secondary | ICD-10-CM

## 2022-10-30 DIAGNOSIS — E1169 Type 2 diabetes mellitus with other specified complication: Secondary | ICD-10-CM | POA: Diagnosis not present

## 2022-10-30 DIAGNOSIS — E785 Hyperlipidemia, unspecified: Secondary | ICD-10-CM

## 2022-10-30 DIAGNOSIS — R0602 Shortness of breath: Secondary | ICD-10-CM

## 2022-10-30 NOTE — Progress Notes (Signed)
MyChart Video Visit Virtual Visit via Video Note   This visit type was conducted due to national recommendations for restrictions regarding the COVID-19 Pandemic (e.g. social distancing) in an effort to limit this patient's exposure and mitigate transmission in our community. This patient is at least at moderate risk for complications without adequate follow up. This format is felt to be most appropriate for this patient at this time. Physical exam was limited by quality of the video and audio technology used for the visit. Shamaine Cousar, CMA, was able to get the patient set up on a video visit.  Patient location: Patient's home Provider location: Office  I discussed the limitations of evaluation and management by telemedicine and the availability of in person appointments. The patient expressed understanding and agreed to proceed.  Visit Date: 10/30/2022  Today's healthcare provider: Danise Edge, MD     Subjective:    Patient ID: Julie Bishop, female    DOB: 11-12-1970, 52 y.o.   MRN: [id number]  No chief complaint on file.   HPI Patient is in today for a virtual visit. She denies recent hospitalization, febrile illness, CP/palpitations/SOB/HA/fever/chills/GI or GU symptoms. She was seen cardiology PA Tereso Newcomer on 10/16/2022 due to symptoms of precordial pain and shortness of breath with exertion. A cardiac CT scan was completed on 10/29/2022 which revealed a coronary artery calcium score of 0 and no evidence of coronary artery. Her most recent blood work was completed on 10/24/2022 which revealed elevated blood glucose and liver enzymes. She has been taking Rybelsus 3 mg to manage hyperglycemia and Rosuvastatin 20 mg to manage hyperlipidemia, both of which have been tolerable. She is also taking Metoprolol Succinate 50 mg twice daily to manage hypertension, which has been tolerable. Lab Results  Component Value Date   HGBA1C 5.7 08/14/2021   Wt Readings from Last 3  Encounters:  10/16/22 240 lb (108.9 kg)  04/11/22 235 lb 6.4 oz (106.8 kg)  02/06/22 229 lb 6.4 oz (104.1 kg)   Past Medical History:  Diagnosis Date   Adjustment disorder 01/25/2017   Anemia    Anxiety and depression    Back pain    Cancer (HCC) 2008   skin cancer, BCC   Chest pain 07/01/2016   CCTA 10/29/2022: CAC score 0, no CAD   Constipation 11/14/2014   Deep vein blood clot of left lower extremity (HCC) 12/2019   Depression    Diabetes mellitus without complication (HCC)    Generalized anxiety disorder    GERD (gastroesophageal reflux disease)    Headache 08/24/2015   History of cardiovascular stress test    GXT 3/19: No ischemic ECG changes   History of echocardiogram    Echo 2/18:  EF 50-55, GLS -20% (normal), normal wall motion, grade 1 diastolic dysfunction, trivial MR, mild RVE, normal RV SF, trivial TR, PASP 23   Hyperlipidemia    Elevated, but never taken medication   Hypertension    Insomnia 08/24/2015   Obesity 11/14/2014   OSA (obstructive sleep apnea) 09/12/2016   Pain in joint, shoulder region 11/27/2015   Palpitations    Post-menopause 08/23/2015   Raynauds syndrome    Sleep apnea    appt with pulmonary Dr in march 2018   Vitamin B12 deficiency 08/23/2015   Vitamin D deficiency 08/23/2015    Past Surgical History:  Procedure Laterality Date   ABDOMINAL HYSTERECTOMY  2010   BREAST BIOPSY     right, 1996   ECTOPIC PREGNANCY SURGERY  RETINAL DETACHMENT SURGERY Right    SHOULDER OPEN ROTATOR CUFF REPAIR  2017   right   TUBAL LIGATION     on right   TYMPANOSTOMY TUBE PLACEMENT      Family History  Problem Relation Age of Onset   Diabetes Mother    Depression Mother    Hypertension Mother    Hyperlipidemia Mother    Liver disease Mother    Sleep apnea Mother    Obesity Mother    Heart attack Mother 62       CAD w/MI in mid 92s; s/p PCI   Diabetes Father    Hypertension Father    Vascular Disease Father        subclavian artery  obstruction s/p stent   Hypertension Sister        as a teenager   Arthritis Paternal Aunt    Cancer Maternal Grandmother        throat   Heart attack Maternal Grandmother    Cancer Paternal Grandmother        lung   Heart disease Paternal Grandmother     Social History   Socioeconomic History   Marital status: Married    Spouse name: Revella Like   Number of children: 1   Years of education: 12   Highest education level: Not on file  Occupational History   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[employer]  Tobacco Use   Smoking status: Never   Smokeless tobacco: Never  Vaping Use   Vaping Use: Never used  Substance and Sexual Activity   Alcohol use: Not Currently   Drug use: No   Sexual activity: Yes    Partners: Male    Birth control/protection: Post-menopausal    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[place], Runner, broadcasting/film/video., lives with husband  Other Topics Concern   Not on file  Social History Narrative   Not on file   Social Determinants of Health   Financial Resource Strain: Not on file  Food Insecurity: No Food Insecurity (03/09/2020)   Hunger Vital Sign    Worried About Running Out of Food in the Last Year: Never true    Ran Out of Food in the Last Year: Never true  Transportation Needs: Not on file  Physical Activity: Not on file  Stress: Not on file  Social Connections: Not on file  Intimate Partner Violence: Not on file    Outpatient Medications Prior to Visit  Medication Sig Dispense Refill   rosuvastatin (CRESTOR) 20 MG tablet Take 1 tablet (20 mg total) by mouth daily. 90 tablet 3   acetaminophen (TYLENOL) 500 MG tablet Take 1,000 mg by mouth every 6 (six) hours as needed.      DULoxetine (CYMBALTA) 30 MG capsule TAKE ONE CAPSULE BY MOUTH THREE TIMES A DAY 270 capsule 1   enalapril-hydrochlorothiazide (VASERETIC) 10-25 MG tablet Take 1 tablet by mouth daily. 90 tablet 3   LORazepam (ATIVAN) 0.5 MG tablet Take 1 tablet (0.5 mg total) by  mouth 2 (two) times daily as needed for anxiety. 30 tablet 1   metoprolol succinate (TOPROL-XL) 50 MG 24 hr tablet TAKE ONE TABLET BY MOUTH TWICE A DAY WITH OR IMMEDIATELY FOLLOWING A MEAL 180 tablet 1   nitroGLYCERIN (NITROSTAT) 0.4 MG SL tablet Place 1 tablet (0.4 mg total) under the tongue every 5 (five) minutes as needed for chest pain. 25 tablet 3   Semaglutide (RYBELSUS) 3 MG TABS Take 3 mg by mouth daily. 30 tablet 1  metoprolol tartrate (LOPRESSOR) 25 MG tablet Take 1 tablet 2 hours prior to the cardiac ct 1 tablet 0   No facility-administered medications prior to visit.    Allergies  Allergen Reactions   Losartan Other (See Comments)    Diarrhea and abodminal cramping   Oxycodone Itching    Review of Systems  Constitutional:  Negative for chills and fever.  Respiratory:  Negative for shortness of breath.   Cardiovascular:  Negative for chest pain and palpitations.  Gastrointestinal:  Negative for abdominal pain, blood in stool, constipation, diarrhea, nausea and vomiting.  Genitourinary:  Negative for dysuria, frequency, hematuria and urgency.  Skin:           Neurological:  Negative for headaches.       Objective:    Physical Exam  There were no vitals taken for this visit. Wt Readings from Last 3 Encounters:  10/16/22 240 lb (108.9 kg)  04/11/22 235 lb 6.4 oz (106.8 kg)  02/06/22 229 lb 6.4 oz (104.1 kg)    Diabetic Foot Exam - Simple   No data filed    Lab Results  Component Value Date   WBC 5.1 10/16/2022   HGB 11.8 10/16/2022   HCT 34.9 10/16/2022   PLT 261 10/16/2022   GLUCOSE 101 (H) 10/24/2022   CHOL 136 10/24/2022   TRIG 171 (H) 10/24/2022   HDL 59 10/24/2022   LDLCALC 49 10/24/2022   ALT 34 (H) 10/24/2022   AST 23 10/24/2022   NA 141 10/24/2022   K 4.1 10/24/2022   CL 99 10/24/2022   CREATININE 0.81 10/24/2022   BUN 20 10/24/2022   CO2 25 10/24/2022   TSH 2.65 08/14/2021   HGBA1C 5.7 08/14/2021    Lab Results  Component Value Date    TSH 2.65 08/14/2021   Lab Results  Component Value Date   WBC 5.1 10/16/2022   HGB 11.8 10/16/2022   HCT 34.9 10/16/2022   MCV 82 10/16/2022   PLT 261 10/16/2022   Lab Results  Component Value Date   NA 141 10/24/2022   K 4.1 10/24/2022   CO2 25 10/24/2022   GLUCOSE 101 (H) 10/24/2022   BUN 20 10/24/2022   CREATININE 0.81 10/24/2022   BILITOT 0.3 10/24/2022   ALKPHOS 67 10/24/2022   AST 23 10/24/2022   ALT 34 (H) 10/24/2022   PROT 7.1 10/24/2022   ALBUMIN 4.5 10/24/2022   CALCIUM 9.9 10/24/2022   ANIONGAP 8 09/01/2021   EGFR 88 10/24/2022   GFR 103.48 08/14/2021   Lab Results  Component Value Date   CHOL 136 10/24/2022   Lab Results  Component Value Date   HDL 59 10/24/2022   Lab Results  Component Value Date   LDLCALC 49 10/24/2022   Lab Results  Component Value Date   TRIG 171 (H) 10/24/2022   Lab Results  Component Value Date   CHOLHDL 2.3 10/24/2022   Lab Results  Component Value Date   HGBA1C 5.7 08/14/2021      Assessment & Plan:  Encouraged 6-8 hours of sleep, heart healthy diet, 60-80 oz of non-alcohol/non-caffeinated fluids, and 4000-8000 steps daily. Encouraged patient to continue taking Rybelsus 3 mg and Rosuvastatin 20 mg. Problem List Items Addressed This Visit     Vitamin D deficiency    Supplement and monitor       Vitamin B12 deficiency    Supplement and monitor       SOB (shortness of breath)    Recent cardiac work up  reassuring. Encouraged to increase physical activity      Hyperlipidemia associated with type 2 diabetes mellitus (HCC) - Primary    Tolerating statin, encouraged heart healthy diet, avoid trans fats, minimize simple carbs and saturated fats. Increase exercise as tolerated.      Essential hypertension (Chronic)    Monitor at home and report any concerns, no changes to meds. Encouraged heart healthy diet such as the DASH diet and exercise as tolerated.       No orders of the defined types were placed in  this encounter.  I discussed the assessment and treatment plan with the patient. The patient was provided an opportunity to ask questions and all were answered. The patient agreed with the plan and demonstrated an understanding of the instructions.   The patient was advised to call back or seek an in-person evaluation if the symptoms worsen or if the condition fails to improve as anticipated.  I provided 20 minutes of face-to-face time during this encounter.  I,Mohammed Iqbal,acting as a scribe for Danise Edge, MD.,have documented all relevant documentation on the behalf of Danise Edge, MD,as directed by  Danise Edge, MD while in the presence of Danise Edge, MD.  Danise Edge, MD [facility] [phone] (phone) [phone] (fax)  [facility]

## 2022-10-31 ENCOUNTER — Encounter: Payer: Self-pay | Admitting: Family Medicine

## 2022-10-31 NOTE — Assessment & Plan Note (Signed)
Recent cardiac work up reassuring. Encouraged to increase physical activity

## 2022-11-02 ENCOUNTER — Ambulatory Visit: Payer: Federal, State, Local not specified - PPO | Attending: Physician Assistant

## 2022-11-02 DIAGNOSIS — I10 Essential (primary) hypertension: Secondary | ICD-10-CM | POA: Diagnosis not present

## 2022-11-02 DIAGNOSIS — Z79899 Other long term (current) drug therapy: Secondary | ICD-10-CM | POA: Diagnosis not present

## 2022-11-03 LAB — BASIC METABOLIC PANEL
BUN/Creatinine Ratio: 18 (ref 9–23)
BUN: 18 mg/dL (ref 6–24)
CO2: 26 mmol/L (ref 20–29)
Calcium: 9.8 mg/dL (ref 8.7–10.2)
Chloride: 99 mmol/L (ref 96–106)
Creatinine, Ser: 1 mg/dL (ref 0.57–1.00)
Glucose: 102 mg/dL — ABNORMAL HIGH (ref 70–99)
Potassium: 4 mmol/L (ref 3.5–5.2)
Sodium: 139 mmol/L (ref 134–144)
eGFR: 68 mL/min/{1.73_m2} (ref >59)

## 2022-11-05 DIAGNOSIS — L570 Actinic keratosis: Secondary | ICD-10-CM | POA: Diagnosis not present

## 2022-11-05 DIAGNOSIS — L821 Other seborrheic keratosis: Secondary | ICD-10-CM | POA: Diagnosis not present

## 2022-11-05 DIAGNOSIS — L7211 Pilar cyst: Secondary | ICD-10-CM | POA: Diagnosis not present

## 2022-11-08 ENCOUNTER — Other Ambulatory Visit: Payer: Self-pay | Admitting: Family Medicine

## 2022-11-08 DIAGNOSIS — I10 Essential (primary) hypertension: Secondary | ICD-10-CM

## 2022-11-14 ENCOUNTER — Ambulatory Visit (HOSPITAL_COMMUNITY): Payer: Federal, State, Local not specified - PPO | Attending: Cardiology

## 2022-11-14 DIAGNOSIS — I10 Essential (primary) hypertension: Secondary | ICD-10-CM

## 2022-11-14 DIAGNOSIS — R0602 Shortness of breath: Secondary | ICD-10-CM | POA: Insufficient documentation

## 2022-11-14 DIAGNOSIS — R072 Precordial pain: Secondary | ICD-10-CM

## 2022-11-14 LAB — ECHOCARDIOGRAM COMPLETE
Area-P 1/2: 3.72 cm2
S' Lateral: 3.1 cm

## 2022-11-15 ENCOUNTER — Encounter: Payer: Self-pay | Admitting: Physician Assistant

## 2022-11-19 NOTE — Progress Notes (Unsigned)
  Cardiology Office Note:    Date:  11/21/2022  ID:  Julie Bishop, DOB [birth year], MRN [id number] PCP: Bradd Canary, MD  [facility] Providers Cardiologist:  Will Jorja Loa, MD Cardiology APP:  Beatrice Lecher, PA-C        Patient Profile:      Hypertension  Hyperlipidemia Diabetes mellitus OSA L leg DVT in 2021 Completed 2 yrs of anticoagulation  GERD  FHx CAD (M - MI in 24s) TTE 07/22/2019: EF 60-65, no RWMA, normal RVSF, RVSP 30.5, trivial MR  TTE 11/14/22: EF 60-65, no RWMA, Gr 1 DD, NL RVSF, NL PASP, RVSP 23.2, trivial MR, RAP 3  CCTA 10/29/22: tiny hiatal hernia, CAC score 0, no CAD.      History of Present Illness:   Julie Bishop is a 52 y.o. female who returns for follow up of chest pain, shortness of breath, hypertension. Enalapril/hydrochlorothiazide was dose was increased. BNP was normal. Echocardiogram showed normal EF. CCTA showed no CAD.  She is here alone.  Since last seen, she is lost 26 pounds.  She is feeling much better.  She has not had further chest pain or shortness of breath.  She has noticed less palpitations.  She has not had lower extremity edema.  We reviewed the findings on all of her test today.  ROS see HPI    Studies Reviewed:       Risk Assessment/Calculations:           Physical Exam:   VS:  BP 128/84   Pulse 64   Ht 5\' 3"  (1.6 m)   Wt 224 lb 9.6 oz (101.9 kg)   SpO2 97%   BMI 39.79 kg/m    Wt Readings from Last 3 Encounters:  11/21/22 224 lb 9.6 oz (101.9 kg)  10/16/22 240 lb (108.9 kg)  04/11/22 235 lb 6.4 oz (106.8 kg)    Constitutional:      Appearance: Healthy appearance. Not in distress.  Pulmonary:     Breath sounds: Normal breath sounds. No wheezing. No rales.  Cardiovascular:     Normal rate. Regular rhythm.     Murmurs: There is no murmur.  Edema:    Peripheral edema absent.      ASSESSMENT AND PLAN:   Essential hypertension BP is well controlled now on current Rx which includes  Enalapril/hydrochlorothiazide, Metoprolol succinate. No change in Rx.  Hyperlipidemia associated with type 2 diabetes mellitus (HCC) LDL optimal in 09/2022 on current dose of Crestor. No change in Rx.  Chest pain Resolved. As noted her CT was neg for CAD and Echocardiogram showed normal EF. No further testing needed.   Palpitations Palpitations have decreased in frequency. No further testing planned at this time. Consider Zio monitor if symptoms worsen.     Dispo:  Return in about 1 year (around 11/21/2023) for Routine Follow Up, w/ Tereso Newcomer, PA-C.  Signed, Tereso Newcomer, PA-C

## 2022-11-21 ENCOUNTER — Ambulatory Visit: Payer: Federal, State, Local not specified - PPO | Attending: Physician Assistant | Admitting: Physician Assistant

## 2022-11-21 ENCOUNTER — Encounter: Payer: Self-pay | Admitting: Physician Assistant

## 2022-11-21 VITALS — BP 128/84 | HR 64 | Ht 63.0 in | Wt 224.6 lb

## 2022-11-21 DIAGNOSIS — I10 Essential (primary) hypertension: Secondary | ICD-10-CM

## 2022-11-21 DIAGNOSIS — Z7984 Long term (current) use of oral hypoglycemic drugs: Secondary | ICD-10-CM

## 2022-11-21 DIAGNOSIS — R002 Palpitations: Secondary | ICD-10-CM

## 2022-11-21 DIAGNOSIS — R079 Chest pain, unspecified: Secondary | ICD-10-CM

## 2022-11-21 DIAGNOSIS — E785 Hyperlipidemia, unspecified: Secondary | ICD-10-CM

## 2022-11-21 DIAGNOSIS — E1169 Type 2 diabetes mellitus with other specified complication: Secondary | ICD-10-CM

## 2022-11-21 NOTE — Assessment & Plan Note (Signed)
Palpitations have decreased in frequency. No further testing planned at this time. Consider Zio monitor if symptoms worsen.

## 2022-11-21 NOTE — Assessment & Plan Note (Signed)
LDL optimal in 09/2022 on current dose of Crestor. No change in Rx.

## 2022-11-21 NOTE — Assessment & Plan Note (Signed)
Resolved. As noted her CT was neg for CAD and Echocardiogram showed normal EF. No further testing needed.

## 2022-11-21 NOTE — Assessment & Plan Note (Signed)
BP is well controlled now on current Rx which includes Enalapril/hydrochlorothiazide, Metoprolol succinate. No change in Rx.

## 2022-11-21 NOTE — Patient Instructions (Addendum)
Medication Instructions:  Your physician recommends that you continue on your current medications as directed. Please refer to the Current Medication list given to you today.  *If you need a refill on your cardiac medications before your next appointment, please call your pharmacy*   Lab Work: None ordered  If you have labs (blood work) drawn today and your tests are completely normal, you will receive your results only by: MyChart Message (if you have MyChart) OR A paper copy in the mail If you have any lab test that is abnormal or we need to change your treatment, we will call you to review the results.   Testing/Procedures: None ordered   Follow-Up: At [facility], you and your health needs are our priority.  As part of our continuing mission to provide you with exceptional heart care, we have created designated Provider Care Teams.  These Care Teams include your primary Cardiologist (physician) and Advanced Practice Providers (APPs -  Physician Assistants and Nurse Practitioners) who all work together to provide you with the care you need, when you need it.  We recommend signing up for the patient portal called "MyChart".  Sign up information is provided on this After Visit Summary.  MyChart is used to connect with patients for Virtual Visits (Telemedicine).  Patients are able to view lab/test results, encounter notes, upcoming appointments, etc.  Non-urgent messages can be sent to your provider as well.   To learn more about what you can do with MyChart, go to [email or web address].    Your next appointment:   1 year(s)  Provider:   Scott Weaver, PA-C         Other Instructions   

## 2022-11-27 ENCOUNTER — Other Ambulatory Visit: Payer: Self-pay | Admitting: Family Medicine

## 2022-11-27 ENCOUNTER — Encounter: Payer: Self-pay | Admitting: Family Medicine

## 2022-11-27 ENCOUNTER — Telehealth: Payer: Self-pay

## 2022-11-27 MED ORDER — RYBELSUS 7 MG PO TABS: 7.0000 mg | ORAL_TABLET | Freq: Every day | ORAL | 2 refills | Status: DC

## 2022-11-27 NOTE — Telephone Encounter (Signed)
PA initiated via Covermymeds; KEY: [id number].  PA approved. Effective 10/28/2022 through 11/27/2023

## 2022-12-17 ENCOUNTER — Ambulatory Visit: Payer: Federal, State, Local not specified - PPO | Admitting: Family Medicine

## 2022-12-25 ENCOUNTER — Ambulatory Visit: Payer: Federal, State, Local not specified - PPO | Admitting: Family Medicine

## 2023-01-07 ENCOUNTER — Ambulatory Visit: Payer: Federal, State, Local not specified - PPO | Admitting: Family Medicine

## 2023-01-17 ENCOUNTER — Ambulatory Visit: Payer: Federal, State, Local not specified - PPO | Admitting: Family Medicine

## 2023-01-17 NOTE — Progress Notes (Deleted)
Established Patient Office Visit  Subjective   Patient ID: Julie Bishop, female    DOB: 1970-11-18  Age: 53 y.o. MRN: [id number]  No chief complaint on file.   HPI  {History (Optional):23778}  ROS    Objective:     There were no vitals taken for this visit. {Vitals History (Optional):23777}  Physical Exam   No results found for any visits on 01/17/23.  {Labs (Optional):23779}  The 10-year ASCVD risk score (Arnett DK, et al., 2019) is: 2%    Assessment & Plan:   Problem List Items Addressed This Visit   None   No follow-ups on file.    Clayborne Dana, NP

## 2023-01-18 ENCOUNTER — Other Ambulatory Visit: Payer: Self-pay

## 2023-01-18 ENCOUNTER — Ambulatory Visit: Payer: Federal, State, Local not specified - PPO | Attending: Physician Assistant

## 2023-01-18 DIAGNOSIS — I10 Essential (primary) hypertension: Secondary | ICD-10-CM

## 2023-01-18 DIAGNOSIS — E785 Hyperlipidemia, unspecified: Secondary | ICD-10-CM

## 2023-01-18 DIAGNOSIS — Z79899 Other long term (current) drug therapy: Secondary | ICD-10-CM

## 2023-01-18 DIAGNOSIS — E1169 Type 2 diabetes mellitus with other specified complication: Secondary | ICD-10-CM | POA: Diagnosis not present

## 2023-01-18 NOTE — Progress Notes (Signed)
Orders placed as above for labs.  PLAN: -Continue current medications/treatment plan and follow up as scheduled. -Atorvastatin just changed to Rosuvastatin a few days ago. Credit Lipids and LFTs done yesterday. Make sure repeat Lipids and LFTs are done in Aug as originally planned. Tereso Newcomer, PA-C

## 2023-01-19 LAB — LIPID PANEL
Chol/HDL Ratio: 2.2 ratio (ref 0.0–4.4)
Cholesterol, Total: 155 mg/dL (ref 100–199)
HDL: 71 mg/dL (ref >39)
LDL Chol Calc (NIH): 64 mg/dL (ref 0–99)
Triglycerides: 113 mg/dL (ref 0–149)
VLDL Cholesterol Cal: 20 mg/dL (ref 5–40)

## 2023-01-19 LAB — HEPATIC FUNCTION PANEL
ALT: 24 IU/L (ref 0–32)
AST: 16 IU/L (ref 0–40)
Albumin: 4.2 g/dL (ref 3.8–4.9)
Alkaline Phosphatase: 67 IU/L (ref 44–121)
Bilirubin Total: 0.3 mg/dL (ref 0.0–1.2)
Bilirubin, Direct: <0.1 mg/dL (ref 0.00–0.40)
Total Protein: 6.7 g/dL (ref 6.0–8.5)

## 2023-03-03 NOTE — Assessment & Plan Note (Deleted)
Doing well on current meds. No changes  

## 2023-03-03 NOTE — Assessment & Plan Note (Deleted)
Tolerating statin, encouraged heart healthy diet, avoid trans fats, minimize simple carbs and saturated fats. Increase exercise as tolerated 

## 2023-03-03 NOTE — Assessment & Plan Note (Deleted)
Supplement and monitor 

## 2023-03-03 NOTE — Assessment & Plan Note (Deleted)
hgba1c acceptable, minimize simple carbs. Increase exercise as tolerated. Continue current meds 

## 2023-03-03 NOTE — Assessment & Plan Note (Deleted)
Monitor at home and report any concerns, no changes to meds. Encouraged heart healthy diet such as the DASH diet and exercise as tolerated.  

## 2023-03-04 ENCOUNTER — Ambulatory Visit: Payer: Federal, State, Local not specified - PPO | Admitting: Family Medicine

## 2023-03-04 DIAGNOSIS — E559 Vitamin D deficiency, unspecified: Secondary | ICD-10-CM

## 2023-03-04 DIAGNOSIS — E1169 Type 2 diabetes mellitus with other specified complication: Secondary | ICD-10-CM

## 2023-03-04 DIAGNOSIS — E119 Type 2 diabetes mellitus without complications: Secondary | ICD-10-CM

## 2023-03-04 DIAGNOSIS — F419 Anxiety disorder, unspecified: Secondary | ICD-10-CM

## 2023-03-04 DIAGNOSIS — E538 Deficiency of other specified B group vitamins: Secondary | ICD-10-CM

## 2023-03-04 DIAGNOSIS — I10 Essential (primary) hypertension: Secondary | ICD-10-CM

## 2023-04-03 NOTE — Assessment & Plan Note (Signed)
Supplement and monitor 

## 2023-04-03 NOTE — Assessment & Plan Note (Signed)
Using CPAP 

## 2023-04-03 NOTE — Assessment & Plan Note (Signed)
Monitor at home and report any concerns, no changes to meds. Encouraged heart healthy diet such as the DASH diet and exercise as tolerated.  

## 2023-04-03 NOTE — Assessment & Plan Note (Signed)
Tolerating statin, encouraged heart healthy diet, avoid trans fats, minimize simple carbs and saturated fats. Increase exercise as tolerated. hgba1c acceptable, minimize simple carbs. Increase exercise as tolerated. Continue current meds

## 2023-04-04 ENCOUNTER — Encounter: Payer: Self-pay | Admitting: Family Medicine

## 2023-04-04 ENCOUNTER — Ambulatory Visit: Payer: Federal, State, Local not specified - PPO | Admitting: Family Medicine

## 2023-04-04 VITALS — BP 154/98 | HR 74 | Temp 98.4°F | Resp 18 | Ht 63.0 in | Wt 235.4 lb

## 2023-04-04 DIAGNOSIS — E785 Hyperlipidemia, unspecified: Secondary | ICD-10-CM

## 2023-04-04 DIAGNOSIS — E538 Deficiency of other specified B group vitamins: Secondary | ICD-10-CM | POA: Diagnosis not present

## 2023-04-04 DIAGNOSIS — I10 Essential (primary) hypertension: Secondary | ICD-10-CM | POA: Diagnosis not present

## 2023-04-04 DIAGNOSIS — E1169 Type 2 diabetes mellitus with other specified complication: Secondary | ICD-10-CM

## 2023-04-04 DIAGNOSIS — G4733 Obstructive sleep apnea (adult) (pediatric): Secondary | ICD-10-CM

## 2023-04-04 DIAGNOSIS — Z7985 Long-term (current) use of injectable non-insulin antidiabetic drugs: Secondary | ICD-10-CM

## 2023-04-04 DIAGNOSIS — E119 Type 2 diabetes mellitus without complications: Secondary | ICD-10-CM

## 2023-04-04 DIAGNOSIS — E559 Vitamin D deficiency, unspecified: Secondary | ICD-10-CM

## 2023-04-04 LAB — COMPREHENSIVE METABOLIC PANEL
ALT: 26 U/L (ref 0–35)
AST: 19 U/L (ref 0–37)
Albumin: 4.2 g/dL (ref 3.5–5.2)
Alkaline Phosphatase: 59 U/L (ref 39–117)
BUN: 12 mg/dL (ref 6–23)
CO2: 28 meq/L (ref 19–32)
Calcium: 9.4 mg/dL (ref 8.4–10.5)
Chloride: 103 meq/L (ref 96–112)
Creatinine, Ser: 0.6 mg/dL (ref 0.40–1.20)
GFR: 103.51 mL/min (ref >60.00)
Glucose, Bld: 127 mg/dL — ABNORMAL HIGH (ref 70–99)
Potassium: 3.7 meq/L (ref 3.5–5.1)
Sodium: 139 meq/L (ref 135–145)
Total Bilirubin: 0.5 mg/dL (ref 0.2–1.2)
Total Protein: 7 g/dL (ref 6.0–8.3)

## 2023-04-04 LAB — CBC WITH DIFFERENTIAL/PLATELET
Basophils Absolute: 0 10*3/uL (ref 0.0–0.1)
Basophils Relative: 0.5 % (ref 0.0–3.0)
Eosinophils Absolute: 0.1 10*3/uL (ref 0.0–0.7)
Eosinophils Relative: 2.6 % (ref 0.0–5.0)
HCT: 37.7 % (ref 36.0–46.0)
Hemoglobin: 12.4 g/dL (ref 12.0–15.0)
Lymphocytes Relative: 35 % (ref 12.0–46.0)
Lymphs Abs: 1.9 10*3/uL (ref 0.7–4.0)
MCHC: 32.9 g/dL (ref 30.0–36.0)
MCV: 84.4 fL (ref 78.0–100.0)
Monocytes Absolute: 0.3 10*3/uL (ref 0.1–1.0)
Monocytes Relative: 4.8 % (ref 3.0–12.0)
Neutro Abs: 3.1 10*3/uL (ref 1.4–7.7)
Neutrophils Relative %: 57.1 % (ref 43.0–77.0)
Platelets: 279 10*3/uL (ref 150.0–400.0)
RBC: 4.46 Mil/uL (ref 3.87–5.11)
RDW: 15.5 % (ref 11.5–15.5)
WBC: 5.4 10*3/uL (ref 4.0–10.5)

## 2023-04-04 LAB — TSH: TSH: 1.85 u[IU]/mL (ref 0.35–5.50)

## 2023-04-04 LAB — HEMOGLOBIN A1C: Hgb A1c MFr Bld: 6.4 % (ref 4.6–6.5)

## 2023-04-04 LAB — LIPID PANEL
Cholesterol: 226 mg/dL — ABNORMAL HIGH (ref 0–200)
HDL: 71.7 mg/dL (ref >39.00)
LDL Cholesterol: 108 mg/dL — ABNORMAL HIGH (ref 0–99)
NonHDL: 154.2
Total CHOL/HDL Ratio: 3
Triglycerides: 232 mg/dL — ABNORMAL HIGH (ref 0.0–149.0)
VLDL: 46.4 mg/dL — ABNORMAL HIGH (ref 0.0–40.0)

## 2023-04-04 LAB — VITAMIN B12: Vitamin B-12: 254 pg/mL (ref 211–911)

## 2023-04-04 LAB — VITAMIN D 25 HYDROXY (VIT D DEFICIENCY, FRACTURES): VITD: 14.08 ng/mL — ABNORMAL LOW (ref 30.00–100.00)

## 2023-04-04 MED ORDER — ROSUVASTATIN CALCIUM 20 MG PO TABS: 20.0000 mg | ORAL_TABLET | Freq: Every day | ORAL | 3 refills | Status: DC

## 2023-04-04 MED ORDER — VALSARTAN-HYDROCHLOROTHIAZIDE 160-25 MG PO TABS: 1.0000 | ORAL_TABLET | Freq: Every day | ORAL | 3 refills | Status: DC

## 2023-04-04 MED ORDER — DULOXETINE HCL 30 MG PO CPEP: 30.0000 mg | ORAL_CAPSULE | Freq: Three times a day (TID) | ORAL | 1 refills | Status: DC

## 2023-04-04 MED ORDER — TIRZEPATIDE 2.5 MG/0.5ML ~~LOC~~ SOAJ: 2.5000 mg | SUBCUTANEOUS | 1 refills | Status: DC

## 2023-04-04 MED ORDER — LORAZEPAM 0.5 MG PO TABS: 0.5000 mg | ORAL_TABLET | Freq: Two times a day (BID) | ORAL | 2 refills | Status: DC | PRN

## 2023-04-04 NOTE — Assessment & Plan Note (Signed)
Did not tolerate Trulicity or Rybelsus but will try to start Mounjaro 2.5 mg weekly and titrate up monthly as needed and tolerated. hgba1c acceptable, minimize simple carbs. Increase exercise as tolerated. Continue current meds

## 2023-04-04 NOTE — Progress Notes (Signed)
Subjective:    Patient ID: IVANNIA WILLHELM, female    DOB: 17-Mar-1971, 52 y.o.   MRN: [id number]  Chief Complaint  Patient presents with  . Follow-up    HPI Discussed the use of AI scribe software for clinical note transcription with the patient, who gave verbal consent to proceed.  History of Present Illness   The patient, with a history of hypertension and weight issues, presents with concerns about elevated blood pressure readings at home, typically in the 140s/150s over 90s. The patient also expresses concern about weight management. The patient has stopped taking Rybelsus due to the inconvenience of waiting 30 minutes before having coffee. The patient also mentions taking duloxetine and lorazepam.  The patient also reports increased anxiety and stress due to personal circumstances, including job stress and caring for kittens. The patient mentions taking duloxetine, but often only manages to take one dose in the morning instead of the prescribed three times a day. The patient also reports increased use of lorazepam due to heightened anxiety.  The patient also mentions a history of seasonal affective disorder, particularly struggling during this time of year. The patient is considering using a light therapy device to help manage symptoms.        Past Medical History:  Diagnosis Date  . Adjustment disorder 01/25/2017  . Anemia   . Anxiety and depression   . Back pain   . Cancer (HCC) 2008   skin cancer, BCC  . Chest pain 07/01/2016   CCTA 10/29/2022: CAC score 0, no CAD // TTE 11/14/22: EF 60-65, no RWMA, Gr 1 DD, NL RVSF, NL PASP, RVSP 23.2, trivial MR, RAP 3  . Constipation 11/14/2014  . Deep vein blood clot of left lower extremity (HCC) 12/2019  . Depression   . Diabetes mellitus without complication (HCC)   . Generalized anxiety disorder   . GERD (gastroesophageal reflux disease)   . Headache 08/24/2015  . History of cardiovascular stress test    GXT 3/19: No  ischemic ECG changes  . History of echocardiogram    Echo 2/18:  EF 50-55, GLS -20% (normal), normal wall motion, grade 1 diastolic dysfunction, trivial MR, mild RVE, normal RV SF, trivial TR, PASP 23  . Hyperlipidemia    Elevated, but never taken medication  . Hypertension   . Insomnia 08/24/2015  . Obesity 11/14/2014  . OSA (obstructive sleep apnea) 09/12/2016  . Pain in joint, shoulder region 11/27/2015  . Palpitations   . Post-menopause 08/23/2015  . Raynauds syndrome   . Sleep apnea    appt with pulmonary Dr in march 2018  . Vitamin B12 deficiency 08/23/2015  . Vitamin D deficiency 08/23/2015    Past Surgical History:  Procedure Laterality Date  . ABDOMINAL HYSTERECTOMY  2010  . BREAST BIOPSY     right, 1996  . ECTOPIC PREGNANCY SURGERY    . RETINAL DETACHMENT SURGERY Right   . SHOULDER OPEN ROTATOR CUFF REPAIR  2017   right  . TUBAL LIGATION     on right  . TYMPANOSTOMY TUBE PLACEMENT      Family History  Problem Relation Age of Onset  . Diabetes Mother   . Depression Mother   . Hypertension Mother   . Hyperlipidemia Mother   . Liver disease Mother   . Sleep apnea Mother   . Obesity Mother   . Heart attack Mother 41       CAD w/MI in mid 50s; s/p PCI  . Diabetes Father   .  Hypertension Father   . Vascular Disease Father        subclavian artery obstruction s/p stent  . Hypertension Sister        as a teenager  . Arthritis Paternal Aunt   . Cancer Maternal Grandmother        throat  . Heart attack Maternal Grandmother   . Cancer Paternal Grandmother        lung  . Heart disease Paternal Grandmother     Social History   Socioeconomic History  . Marital status: Married    Spouse name: Lexis Potenza  . Number of children: 1  . Years of education: 74  . Highest education level: Not on file  Occupational History  . 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[employer]  Tobacco Use  . Smoking status: Never  . Smokeless tobacco: Never  Vaping  Use  . Vaping status: Never Used  Substance and Sexual Activity  . Alcohol use: Not Currently  . Drug use: No  . Sexual activity: Yes    Partners: Male    Birth control/protection: Post-menopausal    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[place], [employer] lives with husband  Other Topics Concern  . Not on file  Social History Narrative  . Not on file   Social Determinants of Health   Financial Resource Strain: Not on file  Food Insecurity: No Food Insecurity (04/26/2021)   [facility], [facility]   Hunger Vital Sign   . Worried About Programme researcher, broadcasting/film/video in the Last Year: Never true   . Ran Out of Food in the Last Year: Never true  Transportation Needs: Not on file  Physical Activity: Not on file  Stress: Not on file  Social Connections: Unknown (10/10/2021)   Received from [facility], [facility]   Social Network   . Social Network: Not on file  Intimate Partner Violence: Unknown (09/01/2021)   [facility], [facility]   HITS   . Physically Hurt: Not on file   . Insult or Talk Down To: Not on file   . Threaten Physical Harm: Not on file   . Scream or Curse: Not on file    Outpatient Medications Prior to Visit  Medication Sig Dispense Refill  . acetaminophen (TYLENOL) 500 MG tablet Take 1,000 mg by mouth every 6 (six) hours as needed.     . metoprolol succinate (TOPROL-XL) 50 MG 24 hr tablet Take 1 tablet (50 mg total) by mouth daily. Take with or immediately following a meal 180 tablet 1  . DULoxetine (CYMBALTA) 30 MG capsule TAKE ONE CAPSULE BY MOUTH THREE TIMES A DAY 270 capsule 1  . enalapril-hydrochlorothiazide (VASERETIC) 10-25 MG tablet Take 1 tablet by mouth daily. 90 tablet 3  . LORazepam (ATIVAN) 0.5 MG tablet Take 1 tablet (0.5 mg total) by mouth 2 (two) times daily as needed for anxiety. 30 tablet 1  . nitroGLYCERIN (NITROSTAT) 0.4 MG SL tablet Place 1 tablet (0.4 mg total) under the tongue every 5 (five) minutes as  needed for chest pain. 25 tablet 3  . rosuvastatin (CRESTOR) 20 MG tablet Take 1 tablet (20 mg total) by mouth daily. 90 tablet 3  . Semaglutide (RYBELSUS) 7 MG TABS Take 1 tablet (7 mg total) by mouth daily. (Patient not taking: Reported on 04/04/2023) 30 tablet 2   No facility-administered medications prior to visit.    Allergies  Allergen Reactions  . Losartan Other (See Comments)  Diarrhea and abodminal cramping  . Oxycodone Itching    Review of Systems  Constitutional:  Positive for malaise/fatigue. Negative for fever.  HENT:  Negative for congestion.   Eyes:  Negative for blurred vision.  Respiratory:  Negative for shortness of breath.   Cardiovascular:  Negative for chest pain, palpitations and leg swelling.  Gastrointestinal:  Negative for abdominal pain, blood in stool and nausea.  Genitourinary:  Negative for dysuria and frequency.  Musculoskeletal:  Negative for falls.  Skin:  Negative for rash.  Neurological:  Negative for dizziness, loss of consciousness and headaches.  Endo/Heme/Allergies:  Negative for environmental allergies.  Psychiatric/Behavioral:  Negative for depression. The patient is nervous/anxious.        Objective:    Physical Exam Constitutional:      General: She is not in acute distress.    Appearance: Normal appearance. She is well-developed. She is not toxic-appearing.  HENT:     Head: Normocephalic and atraumatic.     Right Ear: External ear normal.     Left Ear: External ear normal.     Nose: Nose normal.  Eyes:     General:        Right eye: No discharge.        Left eye: No discharge.     Conjunctiva/sclera: Conjunctivae normal.  Neck:     Thyroid: No thyromegaly.  Cardiovascular:     Rate and Rhythm: Normal rate and regular rhythm.     Heart sounds: Normal heart sounds. No murmur heard. Pulmonary:     Effort: Pulmonary effort is normal. No respiratory distress.     Breath sounds: Normal breath sounds.  Abdominal:     General:  Bowel sounds are normal.     Palpations: Abdomen is soft.     Tenderness: There is no abdominal tenderness. There is no guarding.  Musculoskeletal:        General: Normal range of motion.     Cervical back: Neck supple.  Lymphadenopathy:     Cervical: No cervical adenopathy.  Skin:    General: Skin is warm and dry.  Neurological:     Mental Status: She is alert and oriented to person, place, and time.  Psychiatric:        Mood and Affect: Mood normal.        Behavior: Behavior normal.        Thought Content: Thought content normal.        Judgment: Judgment normal.   BP (!) 154/98 (BP Location: Right Arm, Patient Position: Sitting, Cuff Size: Large)   Pulse 74   Temp 98.4 F (36.9 C) (Oral)   Resp 18   Ht 5\' 3"  (1.6 m)   Wt 235 lb 6.4 oz (106.8 kg)   SpO2 98%   BMI 41.70 kg/m  Wt Readings from Last 3 Encounters:  04/04/23 235 lb 6.4 oz (106.8 kg)  11/21/22 224 lb 9.6 oz (101.9 kg)  10/16/22 240 lb (108.9 kg)    Diabetic Foot Exam - Simple   No data filed    Lab Results  Component Value Date   WBC 5.4 04/04/2023   HGB 12.4 04/04/2023   HCT 37.7 04/04/2023   PLT 279.0 04/04/2023   GLUCOSE 127 (H) 04/04/2023   CHOL 226 (H) 04/04/2023   TRIG 232.0 (H) 04/04/2023   HDL 71.70 04/04/2023   LDLCALC 108 (H) 04/04/2023   ALT 26 04/04/2023   AST 19 04/04/2023   NA 139 04/04/2023   K 3.7  04/04/2023   CL 103 04/04/2023   CREATININE 0.60 04/04/2023   BUN 12 04/04/2023   CO2 28 04/04/2023   TSH 1.85 04/04/2023   HGBA1C 6.4 04/04/2023    Lab Results  Component Value Date   TSH 1.85 04/04/2023   Lab Results  Component Value Date   WBC 5.4 04/04/2023   HGB 12.4 04/04/2023   HCT 37.7 04/04/2023   MCV 84.4 04/04/2023   PLT 279.0 04/04/2023   Lab Results  Component Value Date   NA 139 04/04/2023   K 3.7 04/04/2023   CO2 28 04/04/2023   GLUCOSE 127 (H) 04/04/2023   BUN 12 04/04/2023   CREATININE 0.60 04/04/2023   BILITOT 0.5 04/04/2023   ALKPHOS 59  04/04/2023   AST 19 04/04/2023   ALT 26 04/04/2023   PROT 7.0 04/04/2023   ALBUMIN 4.2 04/04/2023   CALCIUM 9.4 04/04/2023   ANIONGAP 8 09/01/2021   EGFR 68 11/02/2022   GFR 103.51 04/04/2023   Lab Results  Component Value Date   CHOL 226 (H) 04/04/2023   Lab Results  Component Value Date   HDL 71.70 04/04/2023   Lab Results  Component Value Date   LDLCALC 108 (H) 04/04/2023   Lab Results  Component Value Date   TRIG 232.0 (H) 04/04/2023   Lab Results  Component Value Date   CHOLHDL 3 04/04/2023   Lab Results  Component Value Date   HGBA1C 6.4 04/04/2023       Assessment & Plan:  Essential hypertension Assessment & Plan: Monitor at home and report any concerns, no changes to meds. Encouraged heart healthy diet such as the DASH diet and exercise as tolerated.   Orders: -     Comprehensive metabolic panel -     CBC with Differential/Platelet -     TSH  Hyperlipidemia associated with type 2 diabetes mellitus (HCC) Assessment & Plan: Tolerating statin, encouraged heart healthy diet, avoid trans fats, minimize simple carbs and saturated fats. Increase exercise as tolerated. hgba1c acceptable, minimize simple carbs. Increase exercise as tolerated. Continue current meds   Orders: -     Lipid panel  OSA (obstructive sleep apnea) Assessment & Plan: Using CPAP   Vitamin B12 deficiency Assessment & Plan: Supplement and monitor   Orders: -     Vitamin B12  Vitamin D deficiency Assessment & Plan: Supplement and monitor   Orders: -     VITAMIN D 25 Hydroxy (Vit-D Deficiency, Fractures)  Type 2 diabetes mellitus without complication, unspecified whether long term insulin use (HCC) Assessment & Plan: Did not tolerate Trulicity or Rybelsus but will try to start Mounjaro 2.5 mg weekly and titrate up monthly as needed and tolerated. hgba1c acceptable, minimize simple carbs. Increase exercise as tolerated. Continue current meds   Orders: -     Hemoglobin  A1c  Other orders -     Tirzepatide; Inject 2.5 mg into the skin once a week.  Dispense: 2 mL; Refill: 1 -     Valsartan-hydroCHLOROthiazide; Take 1 tablet by mouth daily.  Dispense: 30 tablet; Refill: 3 -     DULoxetine HCl; Take 1 capsule (30 mg total) by mouth 3 (three) times daily.  Dispense: 270 capsule; Refill: 1 -     Rosuvastatin Calcium; Take 1 tablet (20 mg total) by mouth daily.  Dispense: 90 tablet; Refill: 3 -     LORazepam; Take 1 tablet (0.5 mg total) by mouth 2 (two) times daily as needed for anxiety.  Dispense: 30 tablet;  Refill: 2    Assessment and Plan    Hypertension Persistent elevated blood pressure readings at home and in clinic despite current treatment with Enalapril/HCTZ. Discussed the need for medication adjustment and potential switch to Valsartan. -Check kidney function and potassium levels today. -If labs are normal, switch from Enalapril/HCTZ to Valsartan 160/25mg  daily. -Return in 1 month for blood pressure check and repeat metabolic panel.  Type 2 Diabetes Discontinued Rybelsus due to GI side effects. Discussed the option of GLP-1 agonist injections. -Start Mounjaro injection after a few days of starting Valsartan. -Check A1C and cholesterol today.  Anxiety Increased anxiety symptoms recently, possibly related to stressors at work and home. Currently on Duloxetine, but not taking as prescribed. -Increase Duloxetine to 60mg  daily (two 30mg  capsules in the morning). -Refill Lorazepam for as-needed use. -Consider light therapy for potential seasonal affective disorder.  General Health Maintenance -Administer influenza vaccine today. -Check Vitamin D and B levels today. -Return in 3 months for follow-up.         Danise Edge, MD

## 2023-04-05 ENCOUNTER — Other Ambulatory Visit: Payer: Self-pay | Admitting: *Deleted

## 2023-04-05 ENCOUNTER — Telehealth: Payer: Self-pay

## 2023-04-05 MED ORDER — VITAMIN D (ERGOCALCIFEROL) 1.25 MG (50000 UNIT) PO CAPS: 50000.0000 [IU] | ORAL_CAPSULE | ORAL | 0 refills | Status: DC

## 2023-04-05 NOTE — Telephone Encounter (Signed)
PA initiated via Covermymeds; KEY; [id number]. Awaiting determination.

## 2023-04-08 NOTE — Telephone Encounter (Signed)
Called patient. No answer. Left voicemail.

## 2023-04-08 NOTE — Telephone Encounter (Signed)
Patient returned call. Will continue to take Crestor and follow up next February. Also let her know the Greggory Keen has been updated

## 2023-04-08 NOTE — Telephone Encounter (Signed)
PA approved. Effective 03/06/2023 through 04/04/2024

## 2023-04-18 ENCOUNTER — Ambulatory Visit: Payer: Federal, State, Local not specified - PPO | Admitting: Nurse Practitioner

## 2023-05-08 ENCOUNTER — Ambulatory Visit: Payer: Federal, State, Local not specified - PPO | Admitting: Family Medicine

## 2023-05-08 ENCOUNTER — Telehealth: Payer: Self-pay | Admitting: *Deleted

## 2023-05-08 ENCOUNTER — Other Ambulatory Visit (INDEPENDENT_AMBULATORY_CARE_PROVIDER_SITE_OTHER): Payer: Federal, State, Local not specified - PPO

## 2023-05-08 VITALS — BP 117/68 | HR 64

## 2023-05-08 DIAGNOSIS — I10 Essential (primary) hypertension: Secondary | ICD-10-CM

## 2023-05-08 LAB — COMPREHENSIVE METABOLIC PANEL
ALT: 22 U/L (ref 0–35)
AST: 19 U/L (ref 0–37)
Albumin: 4.4 g/dL (ref 3.5–5.2)
Alkaline Phosphatase: 50 U/L (ref 39–117)
BUN: 17 mg/dL (ref 6–23)
CO2: 31 meq/L (ref 19–32)
Calcium: 9.9 mg/dL (ref 8.4–10.5)
Chloride: 100 meq/L (ref 96–112)
Creatinine, Ser: 0.64 mg/dL (ref 0.40–1.20)
GFR: 101.85 mL/min (ref >60.00)
Glucose, Bld: 79 mg/dL (ref 70–99)
Potassium: 3.9 meq/L (ref 3.5–5.1)
Sodium: 138 meq/L (ref 135–145)
Total Bilirubin: 0.6 mg/dL (ref 0.2–1.2)
Total Protein: 7 g/dL (ref 6.0–8.3)

## 2023-05-08 NOTE — Progress Notes (Signed)
Pt here for Blood pressure check per Dr.  Abner Greenspan  Pt currently takes: valsartan-hydrochlorothizide 160-25mg , metoprolol succinate 50mg    Pt reports compliance with medication.  Does not check blood pressures at home.  BP Readings from Last 3 Encounters:  04/04/23 (!) 154/98  11/21/22 128/84  10/29/22 (!) 142/73     BP today @ = 117/68  HR = 64  Patient does feel fine with this blood pressure no dizziness or no concerns.  Pt advised per Dr. Carmelia Roller continue same regimen and follow up with PCP in February.

## 2023-05-08 NOTE — Telephone Encounter (Signed)
Patient wanted you to know that she done well on the [facility] and would like to go up to the next dose.

## 2023-05-13 ENCOUNTER — Encounter: Payer: Self-pay | Admitting: Family Medicine

## 2023-05-13 MED ORDER — TIRZEPATIDE 5 MG/0.5ML ~~LOC~~ SOAJ: 5.0000 mg | SUBCUTANEOUS | 1 refills | Status: DC

## 2023-06-09 IMAGING — DX DG CHEST 2V
2 series · 2 of 2 positions shown · non-contrast
Comparison: 05/29/2019

CLINICAL DATA: 49-year-old female with cough

EXAM:
CHEST - 2 VIEW

[chest pa]
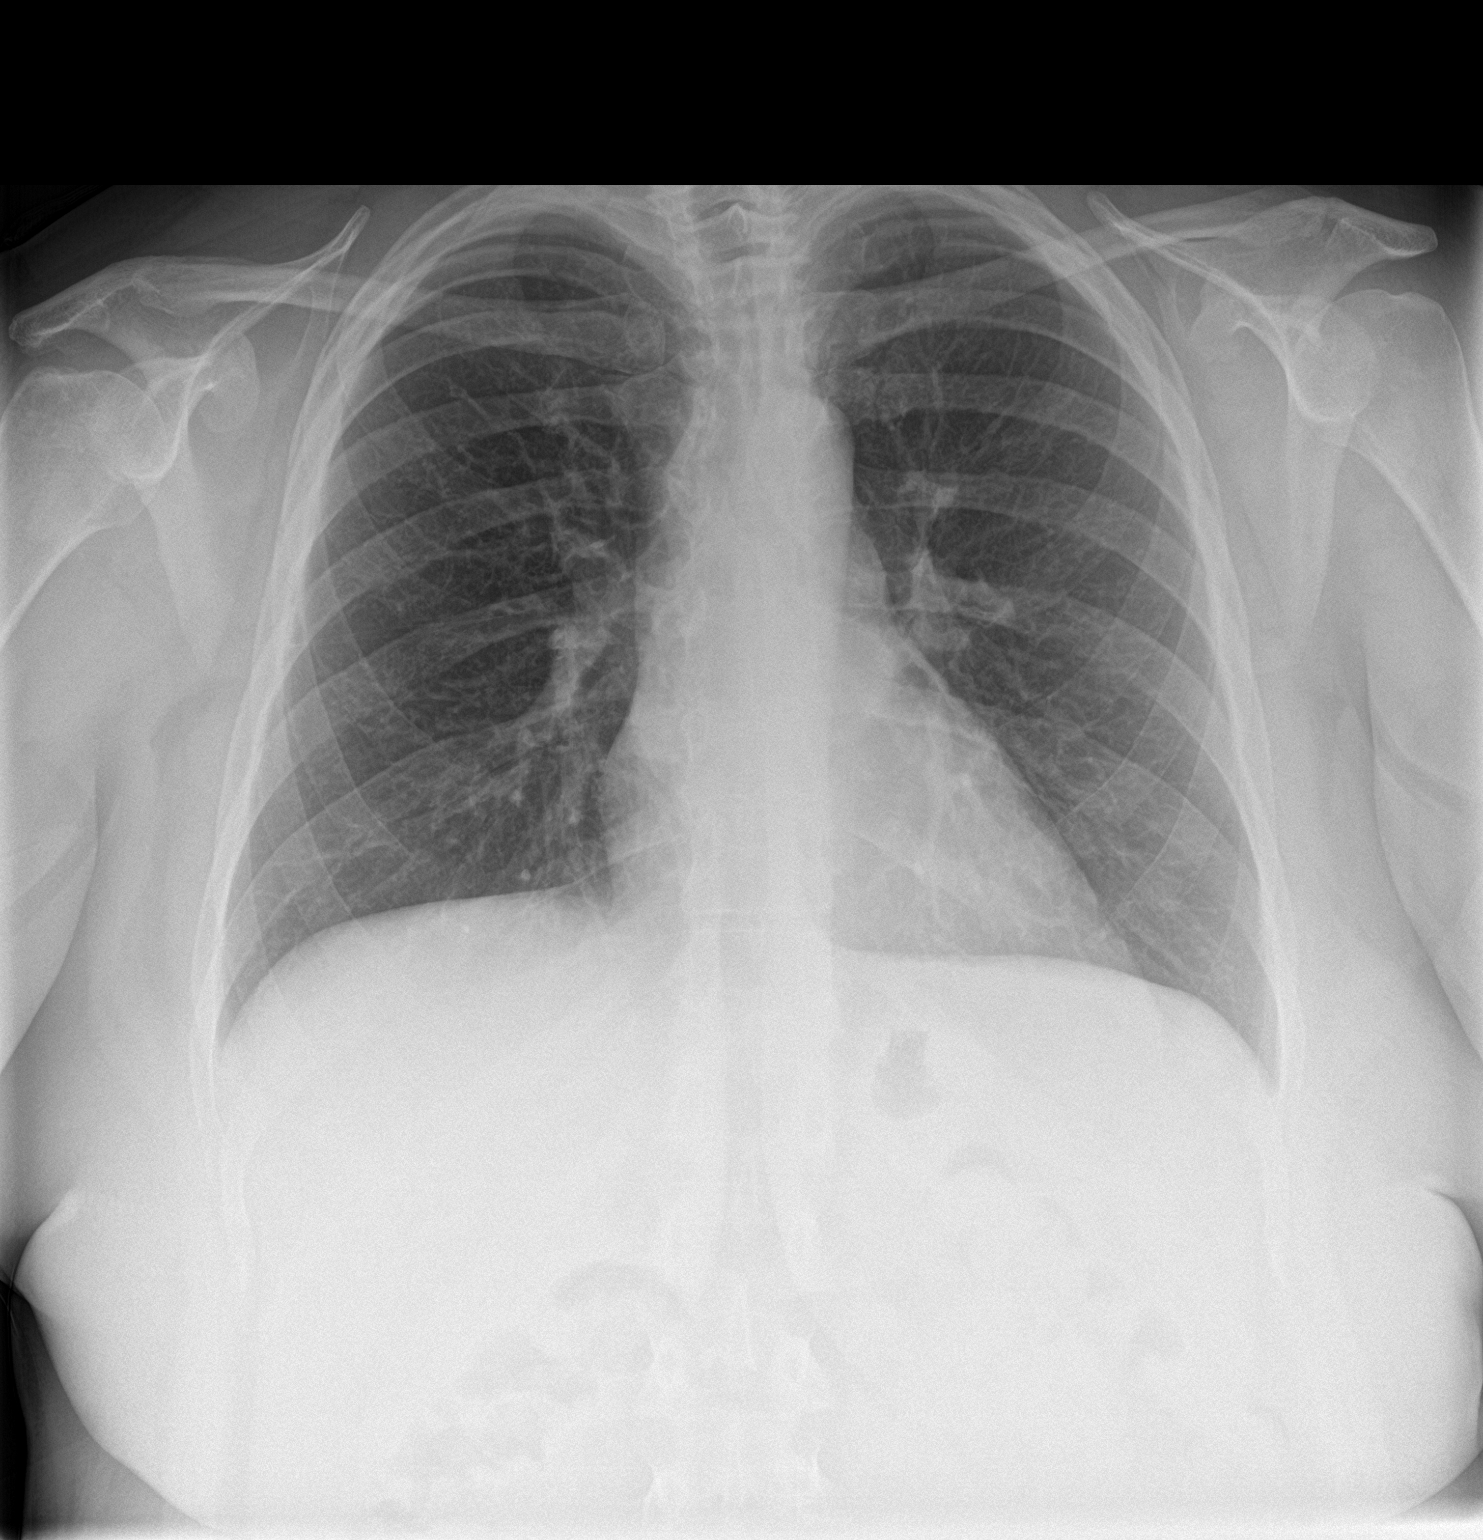

[chest lat]
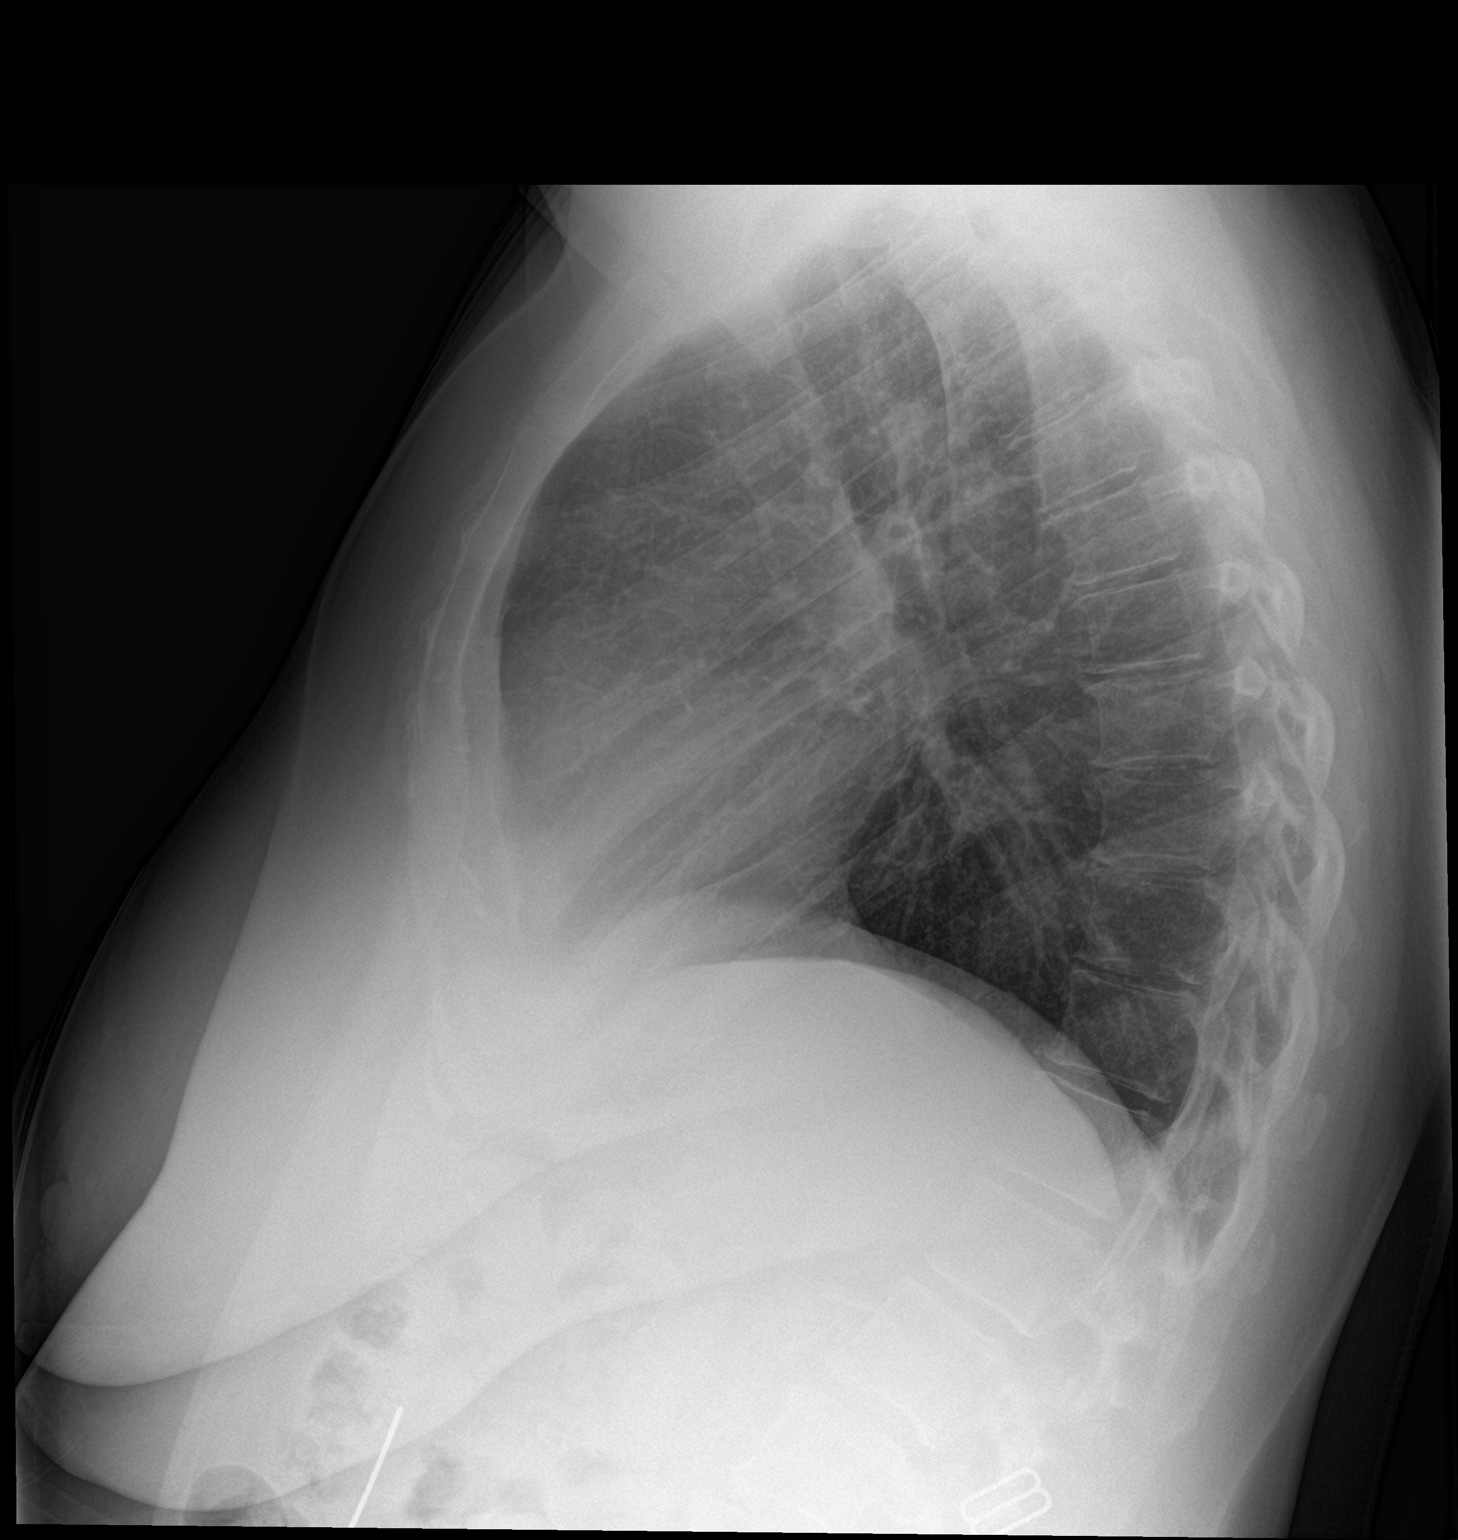

[2 of 2 positions shown; findings below may reference images not displayed]

FINDINGS: Cardiomediastinal silhouette unchanged in size and contour. No
evidence of central vascular congestion. No interlobular septal
thickening.

No pneumothorax or pleural effusion. Coarsened interstitial
markings, with no confluent airspace disease.

No acute displaced fracture. Degenerative changes of the spine.
IMPRESSION: No active cardiopulmonary disease.

## 2023-06-14 ENCOUNTER — Encounter: Payer: Self-pay | Admitting: Student

## 2023-06-14 ENCOUNTER — Ambulatory Visit: Payer: Federal, State, Local not specified - PPO | Admitting: Nurse Practitioner

## 2023-06-24 ENCOUNTER — Other Ambulatory Visit: Payer: Self-pay | Admitting: Family Medicine

## 2023-07-05 ENCOUNTER — Encounter: Payer: Self-pay | Admitting: Family Medicine

## 2023-07-05 ENCOUNTER — Other Ambulatory Visit: Payer: Self-pay | Admitting: Family

## 2023-07-05 MED ORDER — MOUNJARO 7.5 MG/0.5ML ~~LOC~~ SOAJ: 7.5000 mg | SUBCUTANEOUS | 1 refills | Status: DC

## 2023-07-10 NOTE — Assessment & Plan Note (Signed)
Tolerating statin, encouraged heart healthy diet, avoid trans fats, minimize simple carbs and saturated fats. Increase exercise as tolerated. hgba1c acceptable, minimize simple carbs. Increase exercise as tolerated. Continue current meds

## 2023-07-10 NOTE — Assessment & Plan Note (Signed)
Supplement and monitor

## 2023-07-10 NOTE — Assessment & Plan Note (Signed)
Monitor at home and report any concerns, no changes to meds. Encouraged heart healthy diet such as the DASH diet and exercise as tolerated.

## 2023-07-10 NOTE — Assessment & Plan Note (Signed)
Did not tolerate Trulicity or Rybelsus but will try to start Mounjaro 2.5 mg weekly and titrate up monthly as needed and tolerated. hgba1c acceptable, minimize simple carbs. Increase exercise as tolerated. Continue current meds

## 2023-07-11 ENCOUNTER — Ambulatory Visit: Payer: Federal, State, Local not specified - PPO | Admitting: Family Medicine

## 2023-07-11 VITALS — BP 124/80 | HR 68 | Temp 99.2°F | Resp 16 | Ht 63.0 in | Wt 202.6 lb

## 2023-07-11 DIAGNOSIS — E785 Hyperlipidemia, unspecified: Secondary | ICD-10-CM

## 2023-07-11 DIAGNOSIS — E1169 Type 2 diabetes mellitus with other specified complication: Secondary | ICD-10-CM

## 2023-07-11 DIAGNOSIS — I10 Essential (primary) hypertension: Secondary | ICD-10-CM

## 2023-07-11 DIAGNOSIS — Z7985 Long-term (current) use of injectable non-insulin antidiabetic drugs: Secondary | ICD-10-CM

## 2023-07-11 DIAGNOSIS — E559 Vitamin D deficiency, unspecified: Secondary | ICD-10-CM

## 2023-07-11 DIAGNOSIS — E538 Deficiency of other specified B group vitamins: Secondary | ICD-10-CM | POA: Diagnosis not present

## 2023-07-11 DIAGNOSIS — E119 Type 2 diabetes mellitus without complications: Secondary | ICD-10-CM

## 2023-07-11 NOTE — Patient Instructions (Addendum)
Ice twice daily and then apply lidocaine patches   Pinched Nerve in the Wrist (Carpal Tunnel Syndrome): What to Know  Pinched nerve in the wrist (carpal tunnel syndrome, or CTS) is a nerve problem that causes pain, numbness, and weakness in the wrist, hand, and fingers. The carpal tunnel is a narrow space that is on the palm side of your wrist. Repeated wrist motions or certain diseases may cause swelling in the tunnel. This swelling can pinch the main nerve in the wrist (the median nerve). What are the causes? CTS may be caused by: Moving your hand and wrist over and over again while doing a task. Hurting the wrist. Arthritis. A pocket of fluid (cyst) or a growth (tumor) in the carpal tunnel. Fluid buildup when you are pregnant. Use of tools that vibrate. In some cases, the cause of CTS is not known. What increases the risk? You're more likely to have CTS if: You have a job that makes you do these things: Move your hand firmly over and over again. Work with tools that vibrate, such as drills or sanders. You're female. You have diabetes, obesity, thyroid problems, or kidney failure. What are the signs or symptoms? Symptoms of this condition include: A tingling feeling in your fingers. You may feel this pain in the thumb, index finger, or middle finger. Tingling or loss of feeling in your hand. Pain in your entire arm. This pain may get worse when you bend your wrist and elbow for a long time. Pain in your wrist that goes up your arm to your shoulder. Pain that goes down into your palm or fingers. Weakness in your hands. You may find it hard to grab and hold items. Your symptoms may feel worse during the night. How is this diagnosed? CTS is diagnosed with a medical history and physical exam. Tests and imaging may also be done to: Check the electrical signals sent by your nerves into the muscles. Check how well electrical signals pass through your nerves. Check possible causes of  your CTS. These include X-rays, ultrasound, and MRI. How is this treated? CTS may be treated with: Lifestyle changes. You will be asked to stop or change the activity that caused your problem. Physical therapy. This may include: Exercises that stretch and strengthen the muscles and tendons in the wrist and hand. Nerve gliding or flossing exercises. These help keep nerves moving smoothly through the tissues around them. Occupational therapy. You'll learn how to use your hand again. Medicines for pain and swelling. You may have injections in your wrist. A wrist splint or brace. Surgery. Follow these instructions at home: If you have a splint or brace: Wear the splint or brace as told. Take it off only if your provider says you can. Check the skin around it every day. Tell your provider if you see problems. Loosen the splint or brace if your fingers tingle, are numb, or turn cold and blue. Keep the splint or brace clean and dry. If the splint or brace isn't waterproof: Do not let it get wet. Cover it when you take a bath or shower. Use a cover that doesn't let any water in. Managing pain, stiffness, and swelling  Use ice or an ice pack as told. If you have a splint or brace that you can take off, remove it only as told. Place a towel between your skin and the ice. Leave the ice on for 20 minutes, 2-3 times a day. If your skin turns red, take off  the ice right away to prevent skin damage. The risk of damage is higher if you can't feel pain, heat, or cold. Move your fingers often to reduce stiffness and swelling. General instructions Take your medicines only as told. Rest your wrist and hand from activity that may cause pain. If your CTS is caused by things you do at work, talk with your employer about making changes. For example, you may need a wrist pad to use while typing. Exercise as told. Follow instructions on how to do nerve gliding or flossing exercises. These help keep nerves in  moving smoothly through the tissues around them. Keep all follow-up visits. This is important. Where to find more information American Academy of Orthopedic Surgeons: orthoinfo.aaos.[employer] of Neurological Disorders and Stroke: BasicFM.no Contact a health care provider if: You have new symptoms. Your pain is not controlled with medicines. Your symptoms get worse. Get help right away if: Your hand or wrist tingles or is numb, and the symptoms become very bad. This information is not intended to replace advice given to you by your health care provider. Make sure you discuss any questions you have with your health care provider. Document Revised: 03/26/2023 Document Reviewed: 01/11/2023 Elsevier Patient Education  2024 [employer].

## 2023-07-12 NOTE — Progress Notes (Signed)
Subjective:    Patient ID: Julie Bishop, female    DOB: December 04, 1970, 53 y.o.   MRN: [id number]  Chief Complaint  Patient presents with   Follow-up    3 month    HPI Discussed the use of AI scribe software for clinical note transcription with the patient, who gave verbal consent to proceed.  History of Present Illness   The patient, who has been on Mounjaro and Vyvanse, presents with new symptoms suggestive of carpal tunnel syndrome. She reports burning and shooting pain in the right wrist and soreness in the left wrist, which she attributes to overuse from being on the computer all day. The symptoms have been severe enough to prevent the patient from wearing her watch. The patient also mentions that she has been experiencing significant weight loss and reduced food cravings since starting Mounjaro.        Past Medical History:  Diagnosis Date   Adjustment disorder 01/25/2017   Anemia    Anxiety and depression    Back pain    Cancer (HCC) 2008   skin cancer, BCC   Chest pain 07/01/2016   CCTA 10/29/2022: CAC score 0, no CAD // TTE 11/14/22: EF 60-65, no RWMA, Gr 1 DD, NL RVSF, NL PASP, RVSP 23.2, trivial MR, RAP 3   Constipation 11/14/2014   Deep vein blood clot of left lower extremity (HCC) 12/2019   Depression    Diabetes mellitus without complication (HCC)    Generalized anxiety disorder    GERD (gastroesophageal reflux disease)    Headache 08/24/2015   History of cardiovascular stress test    GXT 3/19: No ischemic ECG changes   History of echocardiogram    Echo 2/18:  EF 50-55, GLS -20% (normal), normal wall motion, grade 1 diastolic dysfunction, trivial MR, mild RVE, normal RV SF, trivial TR, PASP 23   Hyperlipidemia    Elevated, but never taken medication   Hypertension    Insomnia 08/24/2015   Obesity 11/14/2014   OSA (obstructive sleep apnea) 09/12/2016   Pain in joint, shoulder region 11/27/2015   Palpitations    Post-menopause 08/23/2015   Raynauds  syndrome    Sleep apnea    appt with pulmonary Dr in march 2018   Vitamin B12 deficiency 08/23/2015   Vitamin D deficiency 08/23/2015    Past Surgical History:  Procedure Laterality Date   ABDOMINAL HYSTERECTOMY  2010   BREAST BIOPSY     right, 1996   ECTOPIC PREGNANCY SURGERY     RETINAL DETACHMENT SURGERY Right    SHOULDER OPEN ROTATOR CUFF REPAIR  2017   right   TUBAL LIGATION     on right   TYMPANOSTOMY TUBE PLACEMENT      Family History  Problem Relation Age of Onset   Diabetes Mother    Depression Mother    Hypertension Mother    Hyperlipidemia Mother    Liver disease Mother    Sleep apnea Mother    Obesity Mother    Heart attack Mother 45       CAD w/MI in mid 83s; s/p PCI   Diabetes Father    Hypertension Father    Vascular Disease Father        subclavian artery obstruction s/p stent   Hypertension Sister        as a teenager   Arthritis Paternal Aunt    Cancer Maternal Grandmother        throat   Heart attack Maternal Grandmother  Cancer Paternal Grandmother        lung   Heart disease Paternal Grandmother     Social History   Socioeconomic History   Marital status: Married    Spouse name: Zollie Clemence   Number of children: 1   Years of education: 12   Highest education level: 12th grade  Occupational History   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[employer]  Tobacco Use   Smoking status: Never   Smokeless tobacco: Never  Vaping Use   Vaping status: Never Used  Substance and Sexual Activity   Alcohol use: Not Currently   Drug use: No   Sexual activity: Yes    Partners: Male    Birth control/protection: Post-menopausal    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[place], Runner, broadcasting/film/video., lives with husband  Other Topics Concern   Not on file  Social History Narrative   Not on file   Social Drivers of Health   Financial Resource Strain: Low Risk  (07/11/2023)   Overall Financial Resource Strain (CARDIA)    Difficulty of  Paying Living Expenses: Not very hard  Food Insecurity: No Food Insecurity (07/11/2023)   Hunger Vital Sign    Worried About Running Out of Food in the Last Year: Never true    Ran Out of Food in the Last Year: Never true  Transportation Needs: No Transportation Needs (07/11/2023)   PRAPARE - Administrator, Civil Service (Medical): No    Lack of Transportation (Non-Medical): No  Physical Activity: Unknown (07/11/2023)   Exercise Vital Sign    Days of Exercise per Week: 0 days    Minutes of Exercise per Session: Not on file  Stress: No Stress Concern Present (07/11/2023)   [employer] of Occupational Health - Occupational Stress Questionnaire    Feeling of Stress : Not at all  Social Connections: Moderately Integrated (07/11/2023)   Social Connection and Isolation Panel [NHANES]    Frequency of Communication with Friends and Family: More than three times a week    Frequency of Social Gatherings with Friends and Family: More than three times a week    Attends Religious Services: More than 4 times per year    Active Member of [employer] or Organizations: No    Attends Engineer, structural: Not on file    Marital Status: Married  Intimate Partner Violence: Unknown (09/01/2021)   Received from [employer], [facility]   HITS    Physically Hurt: Not on file    Insult or Talk Down To: Not on file    Threaten Physical Harm: Not on file    Scream or Curse: Not on file    Outpatient Medications Prior to Visit  Medication Sig Dispense Refill   acetaminophen (TYLENOL) 500 MG tablet Take 1,000 mg by mouth every 6 (six) hours as needed.      DULoxetine (CYMBALTA) 30 MG capsule Take 1 capsule (30 mg total) by mouth 3 (three) times daily. 270 capsule 1   LORazepam (ATIVAN) 0.5 MG tablet Take 1 tablet (0.5 mg total) by mouth 2 (two) times daily as needed for anxiety. 30 tablet 2   metoprolol succinate (TOPROL-XL) 50 MG 24 hr tablet Take 1 tablet (50 mg total) by mouth daily.  Take with or immediately following a meal 180 tablet 1   tirzepatide (MOUNJARO) 7.5 MG/0.5ML Pen Inject 7.5 mg into the skin once a week. 2 mL 1   valsartan-hydrochlorothiazide (DIOVAN-HCT) 160-25 MG tablet Take 1 tablet by  mouth daily. 30 tablet 3   Vitamin D, Ergocalciferol, (DRISDOL) 1.25 MG (50000 UNIT) CAPS capsule TAKE 1 CAPSULE BY MOUTH EVERY 7 DAYS 12 capsule 0   nitroGLYCERIN (NITROSTAT) 0.4 MG SL tablet Place 1 tablet (0.4 mg total) under the tongue every 5 (five) minutes as needed for chest pain. 25 tablet 3   rosuvastatin (CRESTOR) 20 MG tablet Take 1 tablet (20 mg total) by mouth daily. 90 tablet 3   No facility-administered medications prior to visit.    Allergies  Allergen Reactions   Losartan Other (See Comments)    Diarrhea and abodminal cramping   Oxycodone Itching    Review of Systems  Constitutional:  Negative for fever and malaise/fatigue.  HENT:  Negative for congestion.   Eyes:  Negative for blurred vision.  Respiratory:  Negative for shortness of breath.   Cardiovascular:  Negative for chest pain, palpitations and leg swelling.  Gastrointestinal:  Negative for abdominal pain, blood in stool and nausea.  Genitourinary:  Negative for dysuria and frequency.  Musculoskeletal:  Positive for joint pain. Negative for falls.  Skin:  Negative for rash.  Neurological:  Negative for dizziness, loss of consciousness and headaches.  Endo/Heme/Allergies:  Negative for environmental allergies.  Psychiatric/Behavioral:  Negative for depression. The patient is not nervous/anxious.        Objective:    Physical Exam Constitutional:      General: She is not in acute distress.    Appearance: Normal appearance. She is well-developed. She is not toxic-appearing.  HENT:     Head: Normocephalic and atraumatic.     Right Ear: External ear normal.     Left Ear: External ear normal.     Nose: Nose normal.  Eyes:     General:        Right eye: No discharge.        Left eye:  No discharge.     Conjunctiva/sclera: Conjunctivae normal.  Neck:     Thyroid: No thyromegaly.  Cardiovascular:     Rate and Rhythm: Normal rate and regular rhythm.     Heart sounds: Normal heart sounds. No murmur heard. Pulmonary:     Effort: Pulmonary effort is normal. No respiratory distress.     Breath sounds: Normal breath sounds.  Abdominal:     General: Bowel sounds are normal.     Palpations: Abdomen is soft.     Tenderness: There is no abdominal tenderness. There is no guarding.  Musculoskeletal:        General: Normal range of motion.     Cervical back: Neck supple.  Lymphadenopathy:     Cervical: No cervical adenopathy.  Skin:    General: Skin is warm and dry.  Neurological:     Mental Status: She is alert and oriented to person, place, and time.  Psychiatric:        Mood and Affect: Mood normal.        Behavior: Behavior normal.        Thought Content: Thought content normal.        Judgment: Judgment normal.     BP 124/80 (BP Location: Left Arm, Patient Position: Sitting, Cuff Size: Large)   Pulse 68   Temp 99.2 F (37.3 C) (Oral)   Resp 16   Ht 5\' 3"  (1.6 m)   Wt 202 lb 9.6 oz (91.9 kg)   SpO2 99%   BMI 35.89 kg/m  Wt Readings from Last 3 Encounters:  07/11/23 202 lb 9.6 oz (91.9  kg)  04/04/23 235 lb 6.4 oz (106.8 kg)  11/21/22 224 lb 9.6 oz (101.9 kg)    Diabetic Foot Exam - Simple   No data filed    Lab Results  Component Value Date   WBC 5.4 04/04/2023   HGB 12.4 04/04/2023   HCT 37.7 04/04/2023   PLT 279.0 04/04/2023   GLUCOSE 79 05/08/2023   CHOL 226 (H) 04/04/2023   TRIG 232.0 (H) 04/04/2023   HDL 71.70 04/04/2023   LDLCALC 108 (H) 04/04/2023   ALT 22 05/08/2023   AST 19 05/08/2023   NA 138 05/08/2023   K 3.9 05/08/2023   CL 100 05/08/2023   CREATININE 0.64 05/08/2023   BUN 17 05/08/2023   CO2 31 05/08/2023   TSH 1.85 04/04/2023   HGBA1C 6.4 04/04/2023    Lab Results  Component Value Date   TSH 1.85 04/04/2023   Lab  Results  Component Value Date   WBC 5.4 04/04/2023   HGB 12.4 04/04/2023   HCT 37.7 04/04/2023   MCV 84.4 04/04/2023   PLT 279.0 04/04/2023   Lab Results  Component Value Date   NA 138 05/08/2023   K 3.9 05/08/2023   CO2 31 05/08/2023   GLUCOSE 79 05/08/2023   BUN 17 05/08/2023   CREATININE 0.64 05/08/2023   BILITOT 0.6 05/08/2023   ALKPHOS 50 05/08/2023   AST 19 05/08/2023   ALT 22 05/08/2023   PROT 7.0 05/08/2023   ALBUMIN 4.4 05/08/2023   CALCIUM 9.9 05/08/2023   ANIONGAP 8 09/01/2021   EGFR 68 11/02/2022   GFR 101.85 05/08/2023   Lab Results  Component Value Date   CHOL 226 (H) 04/04/2023   Lab Results  Component Value Date   HDL 71.70 04/04/2023   Lab Results  Component Value Date   LDLCALC 108 (H) 04/04/2023   Lab Results  Component Value Date   TRIG 232.0 (H) 04/04/2023   Lab Results  Component Value Date   CHOLHDL 3 04/04/2023   Lab Results  Component Value Date   HGBA1C 6.4 04/04/2023       Assessment & Plan:  Essential hypertension Assessment & Plan: Monitor at home and report any concerns, no changes to meds. Encouraged heart healthy diet such as the DASH diet and exercise as tolerated.    Hyperlipidemia associated with type 2 diabetes mellitus (HCC) Assessment & Plan: Tolerating statin, encouraged heart healthy diet, avoid trans fats, minimize simple carbs and saturated fats. Increase exercise as tolerated. hgba1c acceptable, minimize simple carbs. Increase exercise as tolerated. Continue current meds    Type 2 diabetes mellitus without complication, unspecified whether long term insulin use (HCC) Assessment & Plan: Did not tolerate Trulicity or Rybelsus but will try to start Mounjaro 2.5 mg weekly and titrate up monthly as needed and tolerated. hgba1c acceptable, minimize simple carbs. Increase exercise as tolerated. Continue current meds    Vitamin B12 deficiency Assessment & Plan: Supplement and monitor    Vitamin D  deficiency Assessment & Plan: Supplement and monitor      Assessment and Plan    Carpal Tunnel Syndrome New onset of bilateral wrist pain, more severe on the right with burning, shooting pain, and numbness. Likely related to overuse from computer work. No recent changes in diet, medications, or injuries. -Start nightly wrist splints for both wrists. -Apply lidocaine patches over the carpal tunnel area. -Ice wrists twice daily. -If no improvement in a couple of weeks, consider referral to [facility].  Weight Management Patient reports  positive response to [facility], with decreased food cravings and weight loss. No adverse reactions reported. -Continue Mounjaro at current dose. -Check blood work in about a month to monitor sugar, kidney function, etc. -Plan for a virtual visit in 3-4 months, and a physical exam in the fall.  General Health Maintenance -Continue to monitor for flu symptoms given the current severe flu season. -Practice good hygiene including frequent hand washing and avoiding touching the face. -Continue current medications including Vyvanse and Metoprolol.         Danise Edge, MD

## 2023-08-02 ENCOUNTER — Other Ambulatory Visit: Payer: Self-pay | Admitting: Family Medicine

## 2023-08-02 ENCOUNTER — Other Ambulatory Visit: Payer: Self-pay | Admitting: Family

## 2023-08-02 MED ORDER — TIRZEPATIDE 10 MG/0.5ML ~~LOC~~ SOAJ: 10.0000 mg | SUBCUTANEOUS | 1 refills | Status: DC

## 2023-09-04 ENCOUNTER — Telehealth: Payer: Self-pay | Admitting: Nurse Practitioner

## 2023-09-04 NOTE — Telephone Encounter (Signed)
 Cmn received from [facility] for CPAP/BiPAP supplies.

## 2023-09-06 ENCOUNTER — Other Ambulatory Visit (HOSPITAL_BASED_OUTPATIENT_CLINIC_OR_DEPARTMENT_OTHER): Payer: Self-pay | Admitting: Family Medicine

## 2023-09-06 DIAGNOSIS — Z1231 Encounter for screening mammogram for malignant neoplasm of breast: Secondary | ICD-10-CM

## 2023-09-09 NOTE — Telephone Encounter (Signed)
 CMN faxed successfully and signed.

## 2023-09-24 ENCOUNTER — Ambulatory Visit (HOSPITAL_BASED_OUTPATIENT_CLINIC_OR_DEPARTMENT_OTHER)
Admission: RE | Admit: 2023-09-24 | Discharge: 2023-09-24 | Disposition: A | Discharge status: Home / Self Care | Source: Ambulatory Visit | Attending: Family Medicine | Admitting: Family Medicine

## 2023-09-24 ENCOUNTER — Encounter (HOSPITAL_BASED_OUTPATIENT_CLINIC_OR_DEPARTMENT_OTHER): Payer: Self-pay

## 2023-09-24 DIAGNOSIS — Z1231 Encounter for screening mammogram for malignant neoplasm of breast: Secondary | ICD-10-CM | POA: Insufficient documentation

## 2023-09-27 ENCOUNTER — Other Ambulatory Visit: Payer: Self-pay | Admitting: Family

## 2023-09-30 ENCOUNTER — Other Ambulatory Visit: Payer: Self-pay | Admitting: Family Medicine

## 2023-09-30 ENCOUNTER — Encounter: Payer: Self-pay | Admitting: Family Medicine

## 2023-09-30 MED ORDER — TIRZEPATIDE 12.5 MG/0.5ML ~~LOC~~ SOAJ: 12.5000 mg | SUBCUTANEOUS | 2 refills | Status: DC

## 2023-10-07 DIAGNOSIS — H31091 Other chorioretinal scars, right eye: Secondary | ICD-10-CM | POA: Diagnosis not present

## 2023-10-07 DIAGNOSIS — H43393 Other vitreous opacities, bilateral: Secondary | ICD-10-CM | POA: Diagnosis not present

## 2023-10-07 DIAGNOSIS — H43813 Vitreous degeneration, bilateral: Secondary | ICD-10-CM | POA: Diagnosis not present

## 2023-10-07 DIAGNOSIS — H35371 Puckering of macula, right eye: Secondary | ICD-10-CM | POA: Diagnosis not present

## 2023-11-11 ENCOUNTER — Encounter: Payer: Self-pay | Admitting: Physician Assistant

## 2023-11-11 NOTE — Assessment & Plan Note (Signed)
 Tolerating statin, encouraged heart healthy diet, avoid trans fats, minimize simple carbs and saturated fats. Increase exercise as tolerated. hgba1c acceptable, minimize simple carbs. Increase exercise as tolerated. Continue current meds

## 2023-11-11 NOTE — Assessment & Plan Note (Signed)
 Supplement and monitor

## 2023-11-11 NOTE — Assessment & Plan Note (Signed)
 hgba1c acceptable, minimize simple carbs. Increase exercise as tolerated. Continue current meds

## 2023-11-11 NOTE — Assessment & Plan Note (Signed)
Encouraged good sleep hygiene such as dark, quiet room. No blue/green glowing lights such as computer screens in bedroom. No alcohol or stimulants in evening. Cut down on caffeine as able. Regular exercise is helpful but not just prior to bed time. Encouraged to try Melatonin prn

## 2023-11-12 ENCOUNTER — Telehealth: Payer: Federal, State, Local not specified - PPO | Admitting: Family Medicine

## 2023-11-12 DIAGNOSIS — E559 Vitamin D deficiency, unspecified: Secondary | ICD-10-CM

## 2023-11-12 DIAGNOSIS — E538 Deficiency of other specified B group vitamins: Secondary | ICD-10-CM

## 2023-11-12 DIAGNOSIS — E119 Type 2 diabetes mellitus without complications: Secondary | ICD-10-CM

## 2023-11-12 DIAGNOSIS — E1169 Type 2 diabetes mellitus with other specified complication: Secondary | ICD-10-CM

## 2023-11-12 DIAGNOSIS — G4700 Insomnia, unspecified: Secondary | ICD-10-CM

## 2023-11-16 ENCOUNTER — Other Ambulatory Visit: Payer: Self-pay | Admitting: Family Medicine

## 2023-11-16 DIAGNOSIS — I10 Essential (primary) hypertension: Secondary | ICD-10-CM

## 2023-11-25 ENCOUNTER — Other Ambulatory Visit: Payer: Self-pay | Admitting: Family Medicine

## 2023-11-25 ENCOUNTER — Encounter: Payer: Self-pay | Admitting: Family Medicine

## 2023-11-25 MED ORDER — TIRZEPATIDE 15 MG/0.5ML ~~LOC~~ SOAJ: 15.0000 mg | SUBCUTANEOUS | 3 refills | Status: DC

## 2023-12-09 ENCOUNTER — Other Ambulatory Visit: Payer: Self-pay | Admitting: Family Medicine

## 2023-12-30 DIAGNOSIS — G4733 Obstructive sleep apnea (adult) (pediatric): Secondary | ICD-10-CM | POA: Diagnosis not present

## 2024-02-11 ENCOUNTER — Other Ambulatory Visit: Payer: Self-pay | Admitting: Family Medicine

## 2024-03-03 NOTE — Progress Notes (Unsigned)
 Subjective:     Patient ID: Julie Bishop, female    DOB: [birth year], 53 y.o.   MRN: [id number]  No chief complaint on file.   HPI  Discussed the use of AI scribe software for clinical note transcription with the patient, who gave verbal consent to proceed.  History of Present Illness              Health Maintenance Due  Topic Date Due   FOOT EXAM  Never done   Diabetic kidney evaluation - Urine ACR  Never done   Pneumococcal Vaccine: 50+ Years (1 of 2 - PCV) Never done   Hepatitis B Vaccines 19-59 Average Risk (1 of 3 - 19+ 3-dose series) Never done   Colonoscopy  08/06/2022   HEMOGLOBIN A1C  10/02/2023   Influenza Vaccine  12/27/2023   COVID-19 Vaccine (4 - 2025-26 season) 01/27/2024    Past Medical History:  Diagnosis Date   Adjustment disorder 01/25/2017   Anemia    Anxiety and depression    Back pain    Cancer (HCC) 2008   skin cancer, BCC   Chest pain 07/01/2016   CCTA 10/29/2022: CAC score 0, no CAD // TTE 11/14/22: EF 60-65, no RWMA, Gr 1 DD, NL RVSF, NL PASP, RVSP 23.2, trivial MR, RAP 3   Constipation 11/14/2014   Deep vein blood clot of left lower extremity (HCC) 12/2019   Depression    Diabetes mellitus without complication (HCC)    Generalized anxiety disorder    GERD (gastroesophageal reflux disease)    Headache 08/24/2015   History of cardiovascular stress test    GXT 3/19: No ischemic ECG changes   History of echocardiogram    Echo 2/18:  EF 50-55, GLS -20% (normal), normal wall motion, grade 1 diastolic dysfunction, trivial MR, mild RVE, normal RV SF, trivial TR, PASP 23   Hyperlipidemia    Elevated, but never taken medication   Hypertension    Insomnia 08/24/2015   Obesity 11/14/2014   OSA (obstructive sleep apnea) 09/12/2016   Pain in joint, shoulder region 11/27/2015   Palpitations    Post-menopause 08/23/2015   Raynauds syndrome    Sleep apnea    appt with pulmonary Dr in march 2018   Vitamin B12 deficiency 08/23/2015    Vitamin D  deficiency 08/23/2015    Past Surgical History:  Procedure Laterality Date   ABDOMINAL HYSTERECTOMY  2010   BREAST BIOPSY     right, 1996   ECTOPIC PREGNANCY SURGERY     RETINAL DETACHMENT SURGERY Right    SHOULDER OPEN ROTATOR CUFF REPAIR  2017   right   TUBAL LIGATION     on right   TYMPANOSTOMY TUBE PLACEMENT      Family History  Problem Relation Age of Onset   Diabetes Mother    Depression Mother    Hypertension Mother    Hyperlipidemia Mother    Liver disease Mother    Sleep apnea Mother    Obesity Mother    Heart attack Mother 88       CAD w/MI in mid 38s; s/p PCI   Diabetes Father    Hypertension Father    Vascular Disease Father        subclavian artery obstruction s/p stent   Hypertension Sister        as a teenager   Arthritis Paternal Aunt    Cancer Maternal Grandmother        throat   Heart attack Maternal Grandmother  Cancer Paternal Grandmother        lung   Heart disease Paternal Grandmother     Social History   Socioeconomic History   Marital status: Married    Spouse name: Nikitia Asbill   Number of children: 1   Years of education: 12   Highest education level: 12th grade  Occupational History   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[employer]  Tobacco Use   Smoking status: Never   Smokeless tobacco: Never  Vaping Use   Vaping status: Never Used  Substance and Sexual Activity   Alcohol use: Not Currently   Drug use: No   Sexual activity: Yes    Partners: Male    Birth control/protection: Post-menopausal    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[place], Runner, broadcasting/film/video., lives with husband  Other Topics Concern   Not on file  Social History Narrative   Not on file   Social Drivers of Health   Financial Resource Strain: Low Risk  (07/11/2023)   Overall Financial Resource Strain (CARDIA)    Difficulty of Paying Living Expenses: Not very hard  Food Insecurity: No Food Insecurity (07/11/2023)   Hunger Vital Sign     Worried About Running Out of Food in the Last Year: Never true    Ran Out of Food in the Last Year: Never true  Transportation Needs: No Transportation Needs (07/11/2023)   PRAPARE - Administrator, Civil Service (Medical): No    Lack of Transportation (Non-Medical): No  Physical Activity: Unknown (07/11/2023)   Exercise Vital Sign    Days of Exercise per Week: 0 days    Minutes of Exercise per Session: Not on file  Stress: No Stress Concern Present (07/11/2023)   [employer] of Occupational Health - Occupational Stress Questionnaire    Feeling of Stress : Not at all  Social Connections: Moderately Integrated (07/11/2023)   Social Connection and Isolation Panel    Frequency of Communication with Friends and Family: More than three times a week    Frequency of Social Gatherings with Friends and Family: More than three times a week    Attends Religious Services: More than 4 times per year    Active Member of [employer] or Organizations: No    Attends Banker Meetings: Not on file    Marital Status: Married  Intimate Partner Violence: Unknown (09/01/2021)   Received from [facility]   HITS    Physically Hurt: Not on file    Insult or Talk Down To: Not on file    Threaten Physical Harm: Not on file    Scream or Curse: Not on file    Outpatient Medications Prior to Visit  Medication Sig Dispense Refill   acetaminophen  (TYLENOL ) 500 MG tablet Take 1,000 mg by mouth every 6 (six) hours as needed.      DULoxetine  (CYMBALTA ) 30 MG capsule Take 1 capsule (30 mg total) by mouth 3 (three) times daily. 270 capsule 0   LORazepam  (ATIVAN ) 0.5 MG tablet Take 1 tablet (0.5 mg total) by mouth 2 (two) times daily as needed for anxiety. 30 tablet 2   metoprolol  succinate (TOPROL -XL) 50 MG 24 hr tablet Take 1 tablet (50 mg total) by mouth daily. Take with or immediately following a meal 90 tablet 1   nitroGLYCERIN  (NITROSTAT ) 0.4 MG SL tablet Place 1 tablet (0.4 mg total)  under the tongue every 5 (five) minutes as needed for chest pain. 25 tablet 3   rosuvastatin  (CRESTOR )  20 MG tablet Take 1 tablet (20 mg total) by mouth daily. 90 tablet 3   tirzepatide  (MOUNJARO ) 15 MG/0.5ML Pen Inject 15 mg into the skin once a week. 6 mL 3   valsartan -hydrochlorothiazide  (DIOVAN -HCT) 160-25 MG tablet Take 1 tablet by mouth daily. 90 tablet 1   Vitamin D , Ergocalciferol , (DRISDOL ) 1.25 MG (50000 UNIT) CAPS capsule TAKE 1 CAPSULE BY MOUTH EVERY 7 DAYS 12 capsule 0   No facility-administered medications prior to visit.    Allergies  Allergen Reactions   Losartan  Other (See Comments)    Diarrhea and abodminal cramping   Oxycodone Itching    ROS     Objective:    Physical Exam Constitutional:      General: She is not in acute distress.    Appearance: She is obese. She is not ill-appearing, toxic-appearing or diaphoretic.  HENT:     Head: Normocephalic and atraumatic.     Right Ear: Tympanic membrane, ear canal and external ear normal.     Left Ear: Tympanic membrane, ear canal and external ear normal.     Nose: Nose normal. No congestion.     Mouth/Throat:     Mouth: Mucous membranes are moist.     Pharynx: Oropharynx is clear.  Eyes:     Extraocular Movements: Extraocular movements intact.     Right eye: Normal extraocular motion.     Left eye: Normal extraocular motion.     Conjunctiva/sclera: Conjunctivae normal.     Pupils: Pupils are equal, round, and reactive to light.  Neck:     Thyroid : No thyroid  mass or thyromegaly.     Vascular: No carotid bruit or JVD.  Cardiovascular:     Rate and Rhythm: Normal rate and regular rhythm.     Pulses: Normal pulses.          Radial pulses are 2+ on the right side and 2+ on the left side.       Dorsalis pedis pulses are 2+ on the right side and 2+ on the left side.     Heart sounds: Normal heart sounds, S1 normal and S2 normal. No murmur heard.    No friction rub. No gallop.  Pulmonary:     Effort: Pulmonary  effort is normal. No respiratory distress.     Breath sounds: Normal breath sounds.  Abdominal:     General: Bowel sounds are normal. There is no distension.     Palpations: Abdomen is soft.     Tenderness: There is no abdominal tenderness. There is no guarding.  Musculoskeletal:        General: Normal range of motion.     Cervical back: Full passive range of motion without pain and normal range of motion. No edema or erythema.     Right lower leg: No edema.     Left lower leg: No edema.  Lymphadenopathy:     Cervical: No cervical adenopathy.  Skin:    General: Skin is warm and dry.     Capillary Refill: Capillary refill takes less than 2 seconds.  Neurological:     General: No focal deficit present.     Mental Status: She is alert and oriented to person, place, and time.     Cranial Nerves: No cranial nerve deficit.     Motor: No weakness.     Coordination: Coordination normal.     Gait: Gait normal.     Deep Tendon Reflexes: Reflexes normal.  Psychiatric:  Mood and Affect: Mood normal.        Behavior: Behavior normal.        Thought Content: Thought content normal.      There were no vitals taken for this visit. Wt Readings from Last 3 Encounters:  07/11/23 202 lb 9.6 oz (91.9 kg)  04/04/23 235 lb 6.4 oz (106.8 kg)  11/21/22 224 lb 9.6 oz (101.9 kg)       Assessment & Plan:   Problem List Items Addressed This Visit     Annual visit for general adult medical examination without abnormal findings - Primary   Patient encouraged to maintain heart healthy diet, regular exercise, adequate sleep. Consider daily probiotics. Take medications as prescribed.        Anxiety and depression   Stable on Duloxetine , no changes        Essential hypertension (Chronic)   Well controlled, no changes to meds. Encouraged heart healthy diet such as the DASH diet and exercise as tolerated.        Hyperlipidemia associated with type 2 diabetes mellitus (HCC)   Tolerating  statin, encouraged heart healthy diet, avoid trans fats, minimize simple carbs and saturated fats. Increase exercise as tolerated. hgba1c acceptable, minimize simple carbs. Increase exercise as tolerated. Continue current meds       OSA (obstructive sleep apnea)   Follows with Pulmonology, Dr. Shellia. Uses CPAP.      Vitamin B12 deficiency   Supplement and Monitor      Vitamin D  deficiency   Supplement and monitor      Other Visit Diagnoses       Colon cancer screening           I am having Anmarie L. Dibenedetto maintain her acetaminophen , nitroGLYCERIN , rosuvastatin , LORazepam , Vitamin D  (Ergocalciferol ), metoprolol  succinate, tirzepatide , valsartan -hydrochlorothiazide , and DULoxetine .  No orders of the defined types were placed in this encounter.

## 2024-03-05 DIAGNOSIS — Z0000 Encounter for general adult medical examination without abnormal findings: Secondary | ICD-10-CM | POA: Insufficient documentation

## 2024-03-05 NOTE — Assessment & Plan Note (Signed)
 Supplement and Monitor

## 2024-03-05 NOTE — Assessment & Plan Note (Signed)
Stable on Duloxetine, no changes

## 2024-03-05 NOTE — Assessment & Plan Note (Signed)
 Supplement and monitor

## 2024-03-05 NOTE — Assessment & Plan Note (Signed)
 Tolerating statin, encouraged heart healthy diet, avoid trans fats, minimize simple carbs and saturated fats. Increase exercise as tolerated. hgba1c acceptable, minimize simple carbs. Increase exercise as tolerated. Continue current meds   DM Type 2- Managed with Mounjaro .  - Refill Mounjaro  prescription.

## 2024-03-05 NOTE — Assessment & Plan Note (Signed)
 Well controlled, no changes to meds. Encouraged heart healthy diet such as the DASH diet and exercise as tolerated.

## 2024-03-05 NOTE — Assessment & Plan Note (Signed)
 Follows with Pulmonology, Dr. Shellia. Uses CPAP.

## 2024-03-05 NOTE — Assessment & Plan Note (Signed)
 Patient encouraged to maintain heart healthy diet, regular exercise, adequate sleep. Consider daily probiotics. Take medications as prescribed

## 2024-03-06 ENCOUNTER — Ambulatory Visit: Admitting: Student

## 2024-03-06 ENCOUNTER — Encounter: Payer: Self-pay | Admitting: Student

## 2024-03-06 VITALS — BP 122/78 | HR 74 | Temp 98.2°F | Resp 12 | Ht 63.0 in | Wt 153.8 lb

## 2024-03-06 DIAGNOSIS — E1169 Type 2 diabetes mellitus with other specified complication: Secondary | ICD-10-CM

## 2024-03-06 DIAGNOSIS — E119 Type 2 diabetes mellitus without complications: Secondary | ICD-10-CM | POA: Insufficient documentation

## 2024-03-06 DIAGNOSIS — H6121 Impacted cerumen, right ear: Secondary | ICD-10-CM

## 2024-03-06 DIAGNOSIS — G4733 Obstructive sleep apnea (adult) (pediatric): Secondary | ICD-10-CM

## 2024-03-06 DIAGNOSIS — F32A Depression, unspecified: Secondary | ICD-10-CM

## 2024-03-06 DIAGNOSIS — I10 Essential (primary) hypertension: Secondary | ICD-10-CM

## 2024-03-06 DIAGNOSIS — F419 Anxiety disorder, unspecified: Secondary | ICD-10-CM

## 2024-03-06 DIAGNOSIS — E663 Overweight: Secondary | ICD-10-CM

## 2024-03-06 DIAGNOSIS — E559 Vitamin D deficiency, unspecified: Secondary | ICD-10-CM | POA: Diagnosis not present

## 2024-03-06 DIAGNOSIS — Z0000 Encounter for general adult medical examination without abnormal findings: Secondary | ICD-10-CM | POA: Diagnosis not present

## 2024-03-06 DIAGNOSIS — Z79899 Other long term (current) drug therapy: Secondary | ICD-10-CM

## 2024-03-06 DIAGNOSIS — E538 Deficiency of other specified B group vitamins: Secondary | ICD-10-CM

## 2024-03-06 DIAGNOSIS — Z1211 Encounter for screening for malignant neoplasm of colon: Secondary | ICD-10-CM

## 2024-03-06 DIAGNOSIS — E785 Hyperlipidemia, unspecified: Secondary | ICD-10-CM | POA: Diagnosis not present

## 2024-03-06 LAB — COMPREHENSIVE METABOLIC PANEL WITH GFR
ALT: 23 U/L (ref 0–35)
AST: 17 U/L (ref 0–37)
Albumin: 4.6 g/dL (ref 3.5–5.2)
Alkaline Phosphatase: 49 U/L (ref 39–117)
BUN: 14 mg/dL (ref 6–23)
CO2: 30 meq/L (ref 19–32)
Calcium: 9.7 mg/dL (ref 8.4–10.5)
Chloride: 101 meq/L (ref 96–112)
Creatinine, Ser: 0.57 mg/dL (ref 0.40–1.20)
GFR: 104.12 mL/min (ref >60.00)
Glucose, Bld: 82 mg/dL (ref 70–99)
Potassium: 4 meq/L (ref 3.5–5.1)
Sodium: 140 meq/L (ref 135–145)
Total Bilirubin: 0.6 mg/dL (ref 0.2–1.2)
Total Protein: 7.2 g/dL (ref 6.0–8.3)

## 2024-03-06 LAB — LIPID PANEL
Cholesterol: 143 mg/dL (ref 0–200)
HDL: 69.6 mg/dL (ref >39.00)
LDL Cholesterol: 61 mg/dL (ref 0–99)
NonHDL: 73.51
Total CHOL/HDL Ratio: 2
Triglycerides: 65 mg/dL (ref 0.0–149.0)
VLDL: 13 mg/dL (ref 0.0–40.0)

## 2024-03-06 LAB — CBC WITH DIFFERENTIAL/PLATELET
Basophils Absolute: 0 K/uL (ref 0.0–0.1)
Basophils Relative: 0.4 % (ref 0.0–3.0)
Eosinophils Absolute: 0.1 K/uL (ref 0.0–0.7)
Eosinophils Relative: 1.8 % (ref 0.0–5.0)
HCT: 39.5 % (ref 36.0–46.0)
Hemoglobin: 13.3 g/dL (ref 12.0–15.0)
Lymphocytes Relative: 33.9 % (ref 12.0–46.0)
Lymphs Abs: 1.9 K/uL (ref 0.7–4.0)
MCHC: 33.6 g/dL (ref 30.0–36.0)
MCV: 84 fl (ref 78.0–100.0)
Monocytes Absolute: 0.3 K/uL (ref 0.1–1.0)
Monocytes Relative: 4.8 % (ref 3.0–12.0)
Neutro Abs: 3.4 K/uL (ref 1.4–7.7)
Neutrophils Relative %: 59.1 % (ref 43.0–77.0)
Platelets: 269 K/uL (ref 150.0–400.0)
RBC: 4.7 Mil/uL (ref 3.87–5.11)
RDW: 14.6 % (ref 11.5–15.5)
WBC: 5.7 K/uL (ref 4.0–10.5)

## 2024-03-06 LAB — MICROALBUMIN / CREATININE URINE RATIO
Creatinine,U: 159.3 mg/dL
Microalb Creat Ratio: 6.1 mg/g (ref 0.0–30.0)
Microalb, Ur: 1 mg/dL (ref 0.0–1.9)

## 2024-03-06 LAB — TSH: TSH: 2.33 u[IU]/mL (ref 0.35–5.50)

## 2024-03-06 LAB — VITAMIN D 25 HYDROXY (VIT D DEFICIENCY, FRACTURES): VITD: 25.43 ng/mL — ABNORMAL LOW (ref 30.00–100.00)

## 2024-03-06 LAB — VITAMIN B12: Vitamin B-12: 815 pg/mL (ref 211–911)

## 2024-03-06 LAB — HEMOGLOBIN A1C: Hgb A1c MFr Bld: 5.1 % (ref 4.6–6.5)

## 2024-03-06 MED ORDER — LORAZEPAM 0.5 MG PO TABS: 0.5000 mg | ORAL_TABLET | Freq: Every day | ORAL | 2 refills | Status: AC | PRN

## 2024-03-06 MED ORDER — TIRZEPATIDE 15 MG/0.5ML ~~LOC~~ SOAJ: 15.0000 mg | SUBCUTANEOUS | 3 refills | Status: AC

## 2024-03-06 NOTE — Assessment & Plan Note (Addendum)
 Impacted cerumen. - Recommend OTC Debrox for cerumen removal. - Advise against Q-tips to prevent impaction.

## 2024-03-07 ENCOUNTER — Ambulatory Visit: Payer: Self-pay | Admitting: Student

## 2024-03-08 LAB — DRUG MONITORING PANEL 376104, URINE
Amphetamines: NEGATIVE ng/mL (ref <500)
Barbiturates: NEGATIVE ng/mL (ref <300)
Benzodiazepines: NEGATIVE ng/mL (ref <100)
Cocaine Metabolite: NEGATIVE ng/mL (ref <150)
Desmethyltramadol: NEGATIVE ng/mL (ref <100)
Opiates: NEGATIVE ng/mL (ref <100)
Oxycodone: NEGATIVE ng/mL (ref <100)
Tramadol: NEGATIVE ng/mL (ref <100)

## 2024-03-08 LAB — DM TEMPLATE

## 2024-03-27 ENCOUNTER — Encounter: Payer: Self-pay | Admitting: *Deleted

## 2024-03-30 NOTE — Progress Notes (Unsigned)
 OFFICE NOTE:    Date:  03/31/2024  ID:  Julie Bishop, DOB 1971-01-18, MRN [id number] PCP: Domenica Harlene LABOR, MD  [facility] Providers Cardiologist:  Will Gladis Norton, MD Cardiology APP:  Lelon Glendia DASEN, PA-C        Hypertension  Hyperlipidemia Diabetes mellitus OSA L leg DVT in 2021 Completed 2 yrs of anticoagulation  GERD  FHx CAD (M - MI in 50s) Chest pain  TTE 07/22/2019: EF 60-65, no RWMA, normal RVSF, RVSP 30.5, trivial MR  TTE 11/14/22: EF 60-65, no RWMA, Gr 1 DD, NL RVSF, NL PASP, RVSP 23.2, trivial MR, RAP 3  CCTA 10/29/22: tiny hiatal hernia, CAC score 0, no CAD.        Discussed the use of AI scribe software for clinical note transcription with the patient, who gave verbal consent to proceed. History of Present Illness Julie Bishop is a 53 y.o. female for follow up of HTN, HL. Last seen 10/2022.  She is here alone.  She has lost about 100 pounds since May 2024.  She has been on tirzepatide  for management of diabetes and weight loss.  She has not had chest pain, shortness of breath, syncope.  Blood pressures have not really run low at home.  However, she is not checking them often.    ROS-See HPI    Studies Reviewed:  EKG Interpretation Date/Time:  Tuesday March 31 2024 08:09:43 EST Ventricular Rate:  68 PR Interval:  146 QRS Duration:  74 QT Interval:  396 QTC Calculation: 421 R Axis:   70  Text Interpretation: Normal sinus rhythm Normal ECG Confirmed by Lelon Glendia [phone]) on 03/31/2024 8:13:15 AM    Labs 03/06/24: K 4, SCr 0.57, ALT 23, TC 143, HDL 69.6, LDL 61, Trig 65, Hgb 13.3, PLT 269K, TSH 2.33          Physical Exam:  VS:  BP 120/86   Pulse 64   Ht 5' 3 (1.6 m)   Wt 149 lb 12.8 oz (67.9 kg)   SpO2 99%   BMI 26.54 kg/m        Wt Readings from Last 3 Encounters:  03/31/24 149 lb 12.8 oz (67.9 kg)  03/06/24 153 lb 12.8 oz (69.8 kg)  07/11/23 202 lb 9.6 oz (91.9 kg)    Constitutional:      Appearance:  Healthy appearance. Not in distress.  Neck:     Vascular: No carotid bruit. JVD normal.  Pulmonary:     Breath sounds: Normal breath sounds. No wheezing. No rales.  Cardiovascular:     Normal rate. Regular rhythm.     Murmurs: There is no murmur.  Edema:    Peripheral edema absent.  Abdominal:     Palpations: Abdomen is soft.       Assessment and Plan:    Assessment & Plan Essential hypertension Blood pressure well-controlled on current therapy.  This includes metoprolol  succinate 50 mg daily, valsartan /HCTZ 160/25 mg daily.  With weight loss, she may not need as much antihypertensive therapy.  I have asked her to monitor her blood pressure at home. -Follow-up 2 years -She will notify us  if her blood pressure is running low Hyperlipidemia associated with type 2 diabetes mellitus (HCC) LDL optimal on rosuvastatin  20 mg daily.  No change in therapy. History of obesity She has lost 100 lbs since 09/2022.  I have congratulated her on this.         Dispo:  Return in about 2  years (around 03/31/2026) for Routine Follow Up, w/ Glendia Ferrier, PA-C.  Signed, Glendia Ferrier, PA-C

## 2024-03-31 ENCOUNTER — Ambulatory Visit: Attending: Physician Assistant | Admitting: Physician Assistant

## 2024-03-31 ENCOUNTER — Encounter: Payer: Self-pay | Admitting: Physician Assistant

## 2024-03-31 VITALS — BP 120/86 | HR 64 | Ht 63.0 in | Wt 149.8 lb

## 2024-03-31 DIAGNOSIS — I10 Essential (primary) hypertension: Secondary | ICD-10-CM

## 2024-03-31 DIAGNOSIS — E1169 Type 2 diabetes mellitus with other specified complication: Secondary | ICD-10-CM | POA: Diagnosis not present

## 2024-03-31 DIAGNOSIS — E785 Hyperlipidemia, unspecified: Secondary | ICD-10-CM | POA: Diagnosis not present

## 2024-03-31 DIAGNOSIS — Z8639 Personal history of other endocrine, nutritional and metabolic disease: Secondary | ICD-10-CM

## 2024-03-31 MED ORDER — METOPROLOL SUCCINATE ER 50 MG PO TB24: 50.0000 mg | ORAL_TABLET | Freq: Every day | ORAL | 1 refills | Status: DC

## 2024-03-31 MED ORDER — ROSUVASTATIN CALCIUM 20 MG PO TABS: 20.0000 mg | ORAL_TABLET | Freq: Every day | ORAL | 3 refills | Status: DC

## 2024-03-31 MED ORDER — ROSUVASTATIN CALCIUM 20 MG PO TABS: 20.0000 mg | ORAL_TABLET | Freq: Every day | ORAL | 3 refills | Status: AC

## 2024-03-31 MED ORDER — METOPROLOL SUCCINATE ER 50 MG PO TB24: 50.0000 mg | ORAL_TABLET | Freq: Every day | ORAL | 3 refills | Status: AC

## 2024-03-31 NOTE — Patient Instructions (Addendum)
 Medication Instructions:  No changes.  *If you need a refill on your cardiac medications before your next appointment, please call your pharmacy*   Follow-Up: At [facility], you and your health needs are our priority.  As part of our continuing mission to provide you with exceptional heart care, our providers are all part of one team.  This team includes your primary Cardiologist (physician) and Advanced Practice Providers or APPs (Physician Assistants and Nurse Practitioners) who all work together to provide you with the care you need, when you need it.  Your next appointment:   2 year(s)  Provider:   Glendia Ferrier, PA-C          We recommend signing up for the patient portal called MyChart.  Sign up information is provided on this After Visit Summary.  MyChart is used to connect with patients for Virtual Visits (Telemedicine).  Patients are able to view lab/test results, encounter notes, upcoming appointments, etc.  Non-urgent messages can be sent to your provider as well.   To learn more about what you can do with MyChart, go to [email or web address].

## 2024-03-31 NOTE — Assessment & Plan Note (Signed)
 Blood pressure well-controlled on current therapy.  This includes metoprolol  succinate 50 mg daily, valsartan /HCTZ 160/25 mg daily.  With weight loss, she may not need as much antihypertensive therapy.  I have asked her to monitor her blood pressure at home. -Follow-up 2 years -She will notify us  if her blood pressure is running low

## 2024-03-31 NOTE — Assessment & Plan Note (Addendum)
 She has lost 100 lbs since 09/2022.  I have congratulated her on this.

## 2024-03-31 NOTE — Assessment & Plan Note (Signed)
 LDL optimal on rosuvastatin  20 mg daily.  No change in therapy.

## 2024-04-15 ENCOUNTER — Telehealth: Payer: Self-pay

## 2024-04-15 ENCOUNTER — Other Ambulatory Visit (HOSPITAL_COMMUNITY): Payer: Self-pay

## 2024-04-15 ENCOUNTER — Telehealth: Payer: Self-pay | Admitting: Family Medicine

## 2024-04-15 NOTE — Telephone Encounter (Signed)
 Patient was advised.

## 2024-04-15 NOTE — Telephone Encounter (Signed)
 Duplicate/error

## 2024-04-15 NOTE — Telephone Encounter (Unsigned)
 Copied from CRM [id number]. Topic: Clinical - Medication Prior Auth >> Apr 15, 2024 12:39 PM [name] wrote: Reason for CRM: Patient called regarding tirzepatide  (MOUNJARO ) 15 MG/0.5ML Pen. She states husband went to pick up prescription and was told it needs an pre authorization. Patient states she suppose to take medication tonight and doesn't have it. Please follow up with patient. [phone]

## 2024-04-15 NOTE — Telephone Encounter (Signed)
 Pharmacy Patient Advocate Encounter   Received notification from Pt Calls Messages that prior authorization for Mounjaro  15mg /0.110ml is required/requested.   Insurance verification completed.   The patient is insured through [facility] FEP.   Per test claim: PA required; PA submitted to above mentioned insurance via Latent Key/confirmation #/EOC [id number] Status is pending

## 2024-04-15 NOTE — Telephone Encounter (Signed)
 Pharmacy Patient Advocate Encounter  Received notification from [facility] F[employer] that Prior Authorization for Mounjaro  15mg /0.44ml has been APPROVED from 04/15/24 to 04/15/25   PA #/Case ID/Reference #: [id number]

## 2024-04-27 ENCOUNTER — Encounter: Payer: Federal, State, Local not specified - PPO | Admitting: Family Medicine

## 2024-04-30 ENCOUNTER — Other Ambulatory Visit: Payer: Self-pay

## 2024-05-31 ENCOUNTER — Other Ambulatory Visit: Payer: Self-pay | Admitting: Family Medicine

## 2024-07-09 ENCOUNTER — Ambulatory Visit: Admitting: Family Medicine

## 2024-07-28 ENCOUNTER — Encounter: Admitting: Student
